# Patient Record
Sex: Male | Born: 1960 | Race: White | Hispanic: No | Marital: Married | Smoking: Former smoker
Health system: Southern US, Community
[De-identification: ages and names within clinical notes are randomized; demographics above are authoritative.]

## PROBLEM LIST (undated history)

## (undated) DIAGNOSIS — F32A Depression, unspecified: Secondary | ICD-10-CM

## (undated) DIAGNOSIS — F419 Anxiety disorder, unspecified: Secondary | ICD-10-CM

## (undated) DIAGNOSIS — E119 Type 2 diabetes mellitus without complications: Secondary | ICD-10-CM

## (undated) DIAGNOSIS — Z9889 Other specified postprocedural states: Secondary | ICD-10-CM

## (undated) DIAGNOSIS — J449 Chronic obstructive pulmonary disease, unspecified: Secondary | ICD-10-CM

## (undated) DIAGNOSIS — M1990 Unspecified osteoarthritis, unspecified site: Secondary | ICD-10-CM

## (undated) DIAGNOSIS — I10 Essential (primary) hypertension: Secondary | ICD-10-CM

## (undated) DIAGNOSIS — F329 Major depressive disorder, single episode, unspecified: Secondary | ICD-10-CM

## (undated) DIAGNOSIS — K219 Gastro-esophageal reflux disease without esophagitis: Secondary | ICD-10-CM

## (undated) DIAGNOSIS — E039 Hypothyroidism, unspecified: Secondary | ICD-10-CM

## (undated) DIAGNOSIS — Z8719 Personal history of other diseases of the digestive system: Secondary | ICD-10-CM

## (undated) DIAGNOSIS — Z862 Personal history of diseases of the blood and blood-forming organs and certain disorders involving the immune mechanism: Secondary | ICD-10-CM

## (undated) HISTORY — PX: HERNIA REPAIR: SHX51

## (undated) HISTORY — DX: Personal history of diseases of the blood and blood-forming organs and certain disorders involving the immune mechanism: Z86.2

## (undated) HISTORY — DX: Other specified postprocedural states: Z98.890

## (undated) HISTORY — PX: CARDIAC CATHETERIZATION: SHX172

## (undated) HISTORY — DX: Essential (primary) hypertension: I10

## (undated) HISTORY — PX: CARPAL TUNNEL RELEASE: SHX101

## (undated) HISTORY — PX: OTHER SURGICAL HISTORY: SHX169

## (undated) HISTORY — DX: Unspecified osteoarthritis, unspecified site: M19.90

## (undated) HISTORY — DX: Type 2 diabetes mellitus without complications: E11.9

---
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
## 1998-02-17 ENCOUNTER — Encounter: Admission: RE | Admit: 1998-02-17 | Discharge: 1998-02-17 | Payer: Self-pay | Admitting: *Deleted

## 1999-10-21 ENCOUNTER — Encounter: Payer: Self-pay | Admitting: Emergency Medicine

## 1999-10-21 ENCOUNTER — Emergency Department (HOSPITAL_COMMUNITY): Admission: EM | Admit: 1999-10-21 | Discharge: 1999-10-21 | Payer: Self-pay | Admitting: Emergency Medicine

## 2002-09-16 HISTORY — PX: CERVICAL DISC SURGERY: SHX588

## 2003-02-02 ENCOUNTER — Encounter: Payer: Self-pay | Admitting: Neurosurgery

## 2003-02-03 ENCOUNTER — Ambulatory Visit (HOSPITAL_COMMUNITY): Admission: RE | Admit: 2003-02-03 | Discharge: 2003-02-04 | Payer: Self-pay | Admitting: Neurosurgery

## 2003-02-03 ENCOUNTER — Encounter: Payer: Self-pay | Admitting: Neurosurgery

## 2003-07-11 ENCOUNTER — Encounter: Admission: RE | Admit: 2003-07-11 | Discharge: 2003-08-22 | Payer: Self-pay | Admitting: Orthopaedic Surgery

## 2003-11-29 ENCOUNTER — Encounter: Admission: RE | Admit: 2003-11-29 | Discharge: 2003-11-29 | Payer: Self-pay | Admitting: Rheumatology

## 2004-04-05 ENCOUNTER — Ambulatory Visit (HOSPITAL_COMMUNITY): Admission: RE | Admit: 2004-04-05 | Discharge: 2004-04-05 | Payer: Self-pay | Admitting: Family Medicine

## 2004-04-20 ENCOUNTER — Inpatient Hospital Stay (HOSPITAL_BASED_OUTPATIENT_CLINIC_OR_DEPARTMENT_OTHER): Admission: RE | Admit: 2004-04-20 | Discharge: 2004-04-20 | Payer: Self-pay | Admitting: Cardiology

## 2004-08-03 ENCOUNTER — Ambulatory Visit: Payer: Self-pay | Admitting: Family Medicine

## 2005-06-24 ENCOUNTER — Ambulatory Visit: Payer: Self-pay | Admitting: Family Medicine

## 2006-01-13 ENCOUNTER — Ambulatory Visit: Payer: Self-pay | Admitting: Family Medicine

## 2006-03-24 ENCOUNTER — Ambulatory Visit: Payer: Self-pay | Admitting: Family Medicine

## 2006-07-14 ENCOUNTER — Ambulatory Visit: Payer: Self-pay | Admitting: Family Medicine

## 2006-09-10 ENCOUNTER — Encounter: Admission: RE | Admit: 2006-09-10 | Discharge: 2006-09-10 | Payer: Self-pay | Admitting: Rheumatology

## 2007-01-19 ENCOUNTER — Ambulatory Visit: Payer: Self-pay | Admitting: Family Medicine

## 2007-01-20 ENCOUNTER — Ambulatory Visit: Payer: Self-pay | Admitting: Cardiovascular Disease

## 2007-01-20 ENCOUNTER — Ambulatory Visit (HOSPITAL_COMMUNITY): Admission: RE | Admit: 2007-01-20 | Discharge: 2007-01-20 | Payer: Self-pay | Admitting: Family Medicine

## 2008-07-22 ENCOUNTER — Ambulatory Visit (HOSPITAL_COMMUNITY): Admission: RE | Admit: 2008-07-22 | Discharge: 2008-07-22 | Payer: Self-pay | Admitting: Family Medicine

## 2008-10-31 ENCOUNTER — Emergency Department (HOSPITAL_COMMUNITY): Admission: EM | Admit: 2008-10-31 | Discharge: 2008-10-31 | Payer: Self-pay | Admitting: Emergency Medicine

## 2008-11-11 ENCOUNTER — Ambulatory Visit: Payer: Self-pay | Admitting: Cardiology

## 2009-01-20 ENCOUNTER — Ambulatory Visit: Payer: Self-pay | Admitting: Cardiology

## 2009-01-20 ENCOUNTER — Encounter (HOSPITAL_COMMUNITY): Admission: RE | Admit: 2009-01-20 | Discharge: 2009-02-19 | Payer: Self-pay | Admitting: Cardiology

## 2009-08-21 ENCOUNTER — Ambulatory Visit (HOSPITAL_COMMUNITY): Admission: RE | Admit: 2009-08-21 | Discharge: 2009-08-21 | Payer: Self-pay | Admitting: Family Medicine

## 2009-08-27 ENCOUNTER — Ambulatory Visit: Payer: Self-pay | Admitting: Cardiology

## 2010-09-28 ENCOUNTER — Ambulatory Visit (HOSPITAL_COMMUNITY)
Admission: RE | Admit: 2010-09-28 | Discharge: 2010-09-28 | Payer: Self-pay | Source: Home / Self Care | Attending: Family Medicine | Admitting: Family Medicine

## 2010-10-02 ENCOUNTER — Ambulatory Visit (HOSPITAL_COMMUNITY)
Admission: RE | Admit: 2010-10-02 | Discharge: 2010-10-02 | Payer: Self-pay | Source: Home / Self Care | Attending: Urology | Admitting: Urology

## 2010-10-07 ENCOUNTER — Encounter: Payer: Self-pay | Admitting: Cardiology

## 2011-01-01 LAB — POCT CARDIAC MARKERS: Myoglobin, poc: 82.7 ng/mL (ref 12–200)

## 2011-01-01 LAB — HEPATIC FUNCTION PANEL
Albumin: 4.1 g/dL (ref 3.5–5.2)
Alkaline Phosphatase: 46 U/L (ref 39–117)
Indirect Bilirubin: 1 mg/dL — ABNORMAL HIGH (ref 0.3–0.9)
Total Bilirubin: 1.1 mg/dL (ref 0.3–1.2)
Total Protein: 7.3 g/dL (ref 6.0–8.3)

## 2011-01-01 LAB — CBC
Hemoglobin: 14 g/dL (ref 13.0–17.0)
MCHC: 34.5 g/dL (ref 30.0–36.0)
MCV: 97.2 fL (ref 78.0–100.0)
RBC: 4.16 MIL/uL — ABNORMAL LOW (ref 4.22–5.81)
RDW: 13.5 % (ref 11.5–15.5)
WBC: 6.4 10*3/uL (ref 4.0–10.5)

## 2011-01-01 LAB — DIFFERENTIAL
Basophils Relative: 1 % (ref 0–1)
Lymphocytes Relative: 21 % (ref 12–46)
Lymphs Abs: 1.3 10*3/uL (ref 0.7–4.0)
Neutrophils Relative %: 70 % (ref 43–77)

## 2011-01-01 LAB — BASIC METABOLIC PANEL
BUN: 16 mg/dL (ref 6–23)
Calcium: 9.1 mg/dL (ref 8.4–10.5)
Chloride: 102 mEq/L (ref 96–112)
Creatinine, Ser: 0.93 mg/dL (ref 0.4–1.5)
Glucose, Bld: 98 mg/dL (ref 70–99)
Potassium: 4.2 mEq/L (ref 3.5–5.1)

## 2011-01-01 LAB — LIPASE, BLOOD: Lipase: 25 U/L (ref 11–59)

## 2011-01-08 ENCOUNTER — Observation Stay (HOSPITAL_COMMUNITY)
Admission: RE | Admit: 2011-01-08 | Discharge: 2011-01-09 | DRG: 125 | Disposition: A | Discharge status: Home / Self Care | Payer: BC Managed Care – PPO | Source: Ambulatory Visit | Attending: Cardiovascular Disease | Admitting: Cardiovascular Disease

## 2011-01-08 DIAGNOSIS — Z79899 Other long term (current) drug therapy: Secondary | ICD-10-CM | POA: Insufficient documentation

## 2011-01-08 DIAGNOSIS — M069 Rheumatoid arthritis, unspecified: Secondary | ICD-10-CM | POA: Insufficient documentation

## 2011-01-08 DIAGNOSIS — R079 Chest pain, unspecified: Principal | ICD-10-CM | POA: Insufficient documentation

## 2011-01-08 DIAGNOSIS — Z8249 Family history of ischemic heart disease and other diseases of the circulatory system: Secondary | ICD-10-CM | POA: Insufficient documentation

## 2011-01-22 NOTE — Discharge Summary (Signed)
  [name], BOCCIO NO.:  [id number]  MEDICAL RECORD NO.:  [id number]           PATIENT TYPE:  I  LOCATION:  6522                         FACILITY:  [facility]  PHYSICIAN:  Nanetta Batty, M.D.   DATE OF BIRTH:  December 20, 1960  DATE OF ADMISSION:  01/08/2011 DATE OF DISCHARGE:  01/09/2011                              DISCHARGE SUMMARY   DISCHARGE DIAGNOSES: 1. Chest pain, status post left heart catheterization on January 09, 2011, showed normal coronary arteries, normal left ventricular     function. 2. History of gastroesophageal reflux disease. 3. History of rheumatoid arthritis. 4. History of esophageal ulcer recently. 5. Strong family history of coronary artery disease.  HOSPITAL COURSE:  [name] is a 50 year old obese Caucasian male with a history of gastroesophageal reflux disease, rheumatoid arthritis, esophageal ulcer, tobacco abuse .  He presented with chest pain in the office which is different from previous chest pains and given his strong family history, he is admitted and setup for left heart catheterization. The left heart catheterization showed normal coronary arteries and LV function.  He has been seen by Dr. Allyson Sabal, feels he is stable for discharge with return to office visit in 1-2 weeks.  The patient has no further complaints of chest pain at this time.  Discharge vital signs; temperature 97.7, pulse 69, BP 115/76, respirations 20, and O2 sat 95% on room air.  STUDIES/PROCEDURES:  Left heart catheterization on January 09, 2011, revealed normal coronary arteries, normal left ventricular function.  DISCHARGE MEDICATIONS: 1. Aspirin 81 mg enteric-coated 1 tablet by mouth daily. 2. Ambien 10 mg 1 tab by mouth daily at bedtime. 3. Folic acid 1 mg 1 tablet by mouth twice daily. 4. Methotrexate 2.5 mg 10 tablets by mouth on Saturday. 5. Multivitamin 1 tablet by mouth daily. 6. Pantoprazole 40 mg 1 tablet by mouth daily. 7. Wellbutrin SR 150 mg 1  tablet by mouth daily.  DISPOSITION:  Mr. Wah discharged home in stable condition.  He was recommended to increase his activity slowly.  No lifting or driving for 2 days and he shower and bathe.  He was recommended to eat heart-healthy diet.  If catheter site becomes red, painful, swollen or discharges fluid or pus to call our office.  He will follow up with Dr. Allyson Sabal in approximately 1-2 and our office will call him with the appointment time.    ______________________________ Wilburt Finlay, PA   ______________________________ Nanetta Batty, M.D.    BH/MEDQ  D:  01/09/2011  T:  01/10/2011  Job:  [id number]  cc:   Madelin Rear. Sherwood Gambler, MD  Electronically Signed by Wilburt Finlay PA on 01/11/2011 11:23:59 AM Electronically Signed by Nanetta Batty M.D. on 01/22/2011 07:38:23 AM

## 2011-01-22 NOTE — Cardiovascular Report (Signed)
  NAMEDEAUNTE, DENTE NO.:  [id number]  MEDICAL RECORD NO.:  [id number]           PATIENT TYPE:  I  LOCATION:  6522                         FACILITY:  [facility]  PHYSICIAN:  Nanetta Batty, M.D.   DATE OF BIRTH:  11-19-1960  DATE OF PROCEDURE: DATE OF DISCHARGE:  01/09/2011                           CARDIAC CATHETERIZATION   Mr. Strader is a 50 year old mildly overweight married Caucasian male, father of one, grandfather of one grandchild.  His risk factors include 50-pack-year history of tobacco abuse, smoking from the age of [age] when he stopped.  He has an extremely strong positive history of heart disease.  His cath in the 1999 again in August 2005 with normal coronary arteries.  He does have GERD and was recently found to have "esophageal ulcer."  He had accelerated chest pain and palpitations.  Different than his "GERD pain" and presents now for diagnostic coronary arteriography to define his anatomy, rule out ischemic etiology.  DESCRIPTION OF PROCEDURE:  The patient was brought to the second floor [place] cardiac cath lab in the postabsorptive state.  He was premedicated with p.o. Valium, IV fentanyl.  His right wrist was prepped and shaved in usual sterile fashion.  Xylocaine 1% was used for local anesthesia.  A 5-French sheath was inserted into the right radial artery using standard Seldinger technique and a back wall pullback technique. Radial cocktail 10 mL was administered via the sidearm sheath.  The patient received 5000 units of heparin intravenously.  Visipaque dye was used for the entirety of the case.  A 5-French TIG catheter and pigtail catheter were used for selective coronary angiography, left ventriculography.  Retrograde aorta, left ventricular, and pullback pressures were recorded.  HEMODYNAMICS: 1. Aortic systolic pressure 127, diastolic pressure 85. 2. Let ventricular systolic pressure 121, end-diastolic pressure 17.  SELECTIVE  CHOLANGIOGRAPHY: 1. Left main normal. 2. LAD normal. 3. Left circumflex normal. 4. Right coronary was dominant, normal. 5. Left ventriculography; RAO left ventriculogram was performed.  A 25     mL of Visipaque dye at 12 mL per second.  The overall LVEF     estimated 60% without focal wall motion abnormalities.  IMPRESSION:  Mr. Whitby has normal coronary arteries, normal LV function. I believe his chest pain is noncardiac.  The guidewire and catheter removed.  The sheath was removed and TR band was placed in right wrist to achieve patent hemostasis.  The patient left the lab in stable condition.  The TR band will be removed progressively over the next 2 hours.  The patient will be discharged home after that on a proton pump inhibitor. He will see me back in the office in 2-3 weeks for followup.  Dr. Lyman Bishop Fusco's office was notified of these results.     Nanetta Batty, M.D.     JB/MEDQ  D:  01/09/2011  T:  01/09/2011  Job:  [id number]  cc:   [facility] Madelin Rear. Sherwood Gambler, MD  Electronically Signed by Nanetta Batty M.D. on 01/22/2011 07:38:25 AM

## 2011-01-29 NOTE — Assessment & Plan Note (Signed)
[facility]                       [place] CARDIOLOGY OFFICE NOTE   NAME:Mahnke, ZAFIR SCHAUER                       MRN:          [id number]  DATE:11/11/2008                            DOB:          [birth year]    CHIEF COMPLAINT:  Chest tightness, aching into my shoulder and arm!   HISTORY OF PRESENT ILLNESS:  Mr. Reginald Weida is a 50 year old married  white male who had unprovoked discomfort as described above and came to  the emergency room on October 31, 2008.  At that time, his EKG was  normal.  His cardiac markers were negative.  Laboratory data was also  unremarkable including a CBC, BMET, and his chest x-ray was negative.  He also had a lipase as well as LFTs, which were also normal.   He was set up for a visit with [place] because of a positive family history  of coronary artery disease.   He denies any exertional chest tightness and pressure, but does have  some mild dyspnea on exertion.  He has been a heavy smoker in the past  and is overweight having gained 60 pounds since he quit smoking 6 months  ago.  He weighs close to 300 pounds.  He admits this is responsible for  shortness of breath which is not changed traumatically recently.   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  He says his cholesterol  levels are good, though I have no report of these.  He is not diabetic.  He does not have hypertension.   He is very worried about his family history.  However, when you talk to  him about all his brothers and sisters, not to mention his father, who  died of a massive MI at age 62.  They all smoked.  Some of them had type  2 diabetes and were overweight.   PAST MEDICAL HISTORY:  He has no known drug allergies.  He is currently  on folic acid 1 mg p.o. b.i.d., methotrexate 10 tablets q. week,  Prilosec over-the-counter daily, aspirin 325 mg a day.   He does not smoke, does not use any drugs.  He does not comment on  alcohol use.   His  hospitalizations include a cardiac catheterization no 2005 as well  as in 1999.  His catheterization in 2005 did not show any  angiographically significant coronary disease and normal left  ventricular function.   He has had double disk repair in 2004.  He has also had a carpal tunnel  surgery.   SOCIAL HISTORY:  He is married.  He has a 51-year-old granddaughter who  he is just taking custody of, whose father is in prison.  There is lot  of stress obviously.  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   FAMILY HISTORY:  As outlined above.  Please see the diagnostic  evaluation form for details.   REVIEW OF SYSTEMS:  He wears reading glasses.  He has some hearing loss.  The rest of review of systems are negative.   PHYSICAL EXAMINATION:  VITAL SIGNS:  His blood  pressure is 100/80 in the  left arm.  His pulse is 70 and regular.  He is 6 feet 2, weighs 294.  GENERAL:  He is very gregarious, alert and oriented x3.  He is  significantly overweight.  HEENT:  Other than dentition being a poor shape is unremarkable.  NECK:  Carotid upstrokes were equal bilaterally without bruits.  Thyroid  is not enlarged.  Trachea is midline.  LUNGS:  Clear to auscultation and percussion.  HEART:  Nondisplaced PMI, normal S1 and S2.  No gallop.  ABDOMEN:  Protuberant, good bowel sounds.  No midline bruit.  EXTREMITIES:  No cyanosis or clubbing.  1+ edema.  Pulses are intact and  brisk.  NEURO:  Intact.  SKIN:  Unremarkable.   His EKG is completely normal.   I have retrieved carotid Dopplers from Jan 20, 2007, which basically  showed small amount of noncalcified plaque in the left carotid  bifurcation, antegrade flow in both vertebrals, and some minimal intimal  thickening in the right carotid system.   ASSESSMENT:  1. Chest discomfort, nonexertional, doubt significant obstructive      coronary artery disease.  2. Dyspnea on exertion, which is not dramatically changed.  3. Obesity.  4.  Heavy tobacco use in the past which he quit 6 months ago, had      chronic obstructive pulmonary disease.  5. Markedly positive family history of coronary disease, though they      all smoke and many of them are overweight and had some diabetes.   PLAN:  I had a long talk with Mr. Makela today.  We have gone over the  importance of good healthy lifestyle at length.  We have also discussed  the importance of never smoking again.  I have tried to encourage him to  try to walk 3 hours per week particularly with the sedentary job as a  Naval architect.   I have told him to remain on aspirin.  Full range for an exercise rest  stress Myoview to rule out any obstructive coronary disease.  If this is  negative, we will see him back again in 3 years.     Thomas C. Daleen Squibb, MD, [facility]  Electronically Signed    TCW/MedQ  DD: 11/11/2008  DT: 11/12/2008  Job #: [id number]   cc:   Nicoletta Dress. Colon Branch, M.D.

## 2011-02-01 NOTE — Op Note (Signed)
NAME:  Dustin [name], Dustin [name]                          ACCOUNT NO.:  [id number]   MEDICAL RECORD NO.:  [id number]                   PATIENT TYPE:  OIB   LOCATION:  3003                                 FACILITY:  [facility]   PHYSICIAN:  Danae Orleans. Venetia Maxon, M.D.               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   OPERATION PERFORMED:  Anterior cervical decompression and fusion C3-4 and C4-  5 levels with allograft bone graft and anterior cervical plate.   SURGEON:  Danae Orleans. Venetia Maxon, M.D.   ASSISTANT:  Hewitt Shorts, M.D.   ANESTHESIA:  General endotracheal.   ESTIMATED BLOOD LOSS:  75mL.   COMPLICATIONS:  None.   DISPOSITION:  Recovery.   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  It was elected to take him to surgery for anterior cervical  decompression and fusion at these two affected levels.   DESCRIPTION OF PROCEDURE:  Dustin [name] was brought to the operating room.  Following satisfactory and uncomplicated induction of general endotracheal  anesthesia and placement of intravenous lines, the patient was placed in  supine position on the operating table.  His neck was placed in slight  extension.  He was placed in 10 pounds of halter traction.  His anterior  neck was then prepped and draped in the usual sterile fashion.  The area of  planned incision was infiltrated with 0.25% Marcaine, 0.5% lidocaine,  1:200,000 epinephrine.   Incision was made in the midline and carried through  the anterior border of the sternocleidomastoid muscle, sharply to the  platysma layer.  Subplatysmal dissection was performed exposing the anterior  cervical spine keeping the carotid sheath lateral and trachea and esophagus  medial.  The bent spinal needle was placed at what was felt to be C4-5 level  and this was confirmed on intraoperative x-ray.  Subsequently the longus  colli muscle was taken down from the anterior cervical spine from C3 through  C5 levels bilaterally using electrocautery and Key elevator.  Shadowline  self-retaining retractor was placed along with the up down retractor.  The  disk spaces at  C3-4 and C4-5 were then incised with a 15 blade and disk  material was removed in a piecemeal fashion.  The disk space spreader was  placed initially at the C4-5 level and using Carlens microcurets, the end  plates were stripped of residual  disk material down to the posterior  longitudinal ligament.  Similar decompression was performed at the C3-4  level.  Initially at the C3-4 level, the microscope was brought into the  field using high powered microdissection technique using the A2 equivalent  bur, the end plates at C3 and C4 were decorticated and the large uncinate  spur on the left at C3-4 was drilled down.  The posterior longitudinal  ligament was then incised with an arachnoid knife and removed in piecemeal  fashion resulting in significant decompression of the central spinal cord  dura.  The right C4 nerve root was decompressed and the left C4 nerve root  was decompressed.  Some fragments of disk material were found directly  overlying the C4 nerve root and this was extensively decompressed as it  extended out the neural foramen.  Hemostasis was assured with Gelfoam soaked  in Thrombin and using the trial sizers, an 8 mm bone graft was selected.  This was rehydrated and then countersunk into the interspace  appropriately.  Attention was then turned to the C4-5 level where a similar decompression  was performed.  At this level the uncinate spurs were drilled down.  The  posterior longitudinal ligament was again incised with an arachnoid knife  and removed in piecemeal fashion. The central spinal cord dura and bilateral  C5 nerve roots were decompressed to the extent of the neural foramina.  Hemostasis was again ensured.  A similarly sized bone graft was placed.  A  44 mm Trinica anterior cervical plate was then affixed to the anterior  cervical spine with 14 mm variable angle screws, two at C3, two at C4 and  two at C5 levels.  All screws had excellent purchase, locking mechanism  __________.  Final x-ray confirmed positioning of bone grafts and anterior  cervical plate although it was not possible to visualize down to the C4-5  level because of the patient's extremely large body habitus.  The wound was  copiously irrigated with bacitracin saline.  Soft tissues were inspected and  found to be in good repair.  The platysma layer was then closed with 3-0  Vicryl sutures and the skin edges were reapproximated with running 4-0  Vicryl subcuticular stitch.  The wound was dressed with Dermabond.  The  patient was extubated in the operating room and taken to recovery room in  stable and satisfactory condition having tolerated the procedure well.  Counts were correct at the end of the case.                                                Danae Orleans. Venetia Maxon, M.D.    JDS/MEDQ  D:  02/03/2003  T:  02/03/2003  Job:  [id number]

## 2011-02-01 NOTE — Procedures (Signed)
Dustin [name], Dustin [name] NO.:  [id number]   MEDICAL RECORD NO.:  [id number]          PATIENT TYPE:  OUT   LOCATION:  RAD                           FACILITY:  [facility]   PHYSICIAN:  Noralyn Pick. Eden Emms, MD, FACCDATE OF BIRTH:  08-10-1961   DATE OF PROCEDURE:  01/20/2007  DATE OF DISCHARGE:                                ECHOCARDIOGRAM   INDICATIONS:  TIA, strong family history.   FAMILY HISTORY:  The ventricle cavity size was normal.  There were no  regional wall motion abnormalities and no mural thrombus.  Ejection  fraction was 60%.  Mitral valve had some nodular thickening of the  leaflet tips and some mitral annular calcification.  There was mild MR.  Left atrium was borderline in size.  Right-sided cardiac chambers were  normal.  There was no evidence of pulmonary retention. Aortic valve was  trileaflet and minimally sclerotic. Aortic root was normal.  Subcostal  imaging revealed no atrial septal defect, no source of embolus.  There  was a very trivial posterior pericardial effusion.   M-mode measurements included an aortic dimension of 31 mm, left atrial  dimension of 45 mm, septal thickness of 12 mm, LV diastolic dimension of  49 mm, LV systolic dimension of 41 mm.   FINAL IMPRESSION:  1. Mild left ventricular hypertrophy,  ejection fraction 6%.  No wall      motion abnormalities.  2. No source of embolus.  3. Nodular thickening of mitral valve with mild mitral regurgitation.  4. Normal aortic valve.  5. Normal right-sided cardiac chambers without pulmonary hypertension.  6. Trivial posterior pericardial effusion.      Noralyn Pick. Eden Emms, MD, [facility]  Electronically Signed     PCN/MEDQ  D:  01/20/2007  T:  01/20/2007  Job:  [id number]

## 2011-02-01 NOTE — Cardiovascular Report (Signed)
NAME:  Dustin [name], Dustin [name]                          ACCOUNT NO.:  [id number]   MEDICAL RECORD NO.:  [id number]                   PATIENT TYPE:  OIB   LOCATION:  6501                                 FACILITY:  [facility]   PHYSICIAN:  Arturo Morton. Riley Kill, M.D. [facility]         DATE OF BIRTH:  [birth year]   DATE OF PROCEDURE:  DATE OF DISCHARGE:  04/20/2004                              CARDIAC CATHETERIZATION   INDICATIONS:  Mr. Liotta is a 50 year old who has multiple cardiac risk  factors, who has had an abnormal EKG, he has a strong family history of  coronary artery disease.  He saw Dr. Gerri Spore and subsequently was  recommended for cardiac catheterization.  He was sent to the [facility]  [facility] by Dr. Gerri Spore for further evaluation.   PROCEDURE:  1. Left heart catheterization.  2. Selective coronary arteriography.  3. Selective left ventriculography.   DESCRIPTION OF PROCEDURE:  The patient was brought to the catheterization  laboratory and prepped and draped in the usual fashion.  Through an anterior  puncture, the right femoral artery was easily entered.  A 4 French sheath  was placed.  Central aortic and left ventricular pressures were measured  with a pigtail.  Ventriculography was performed in the RAO projection.  Coronary arteriography was performed with standard Judkins catheters without  complication.  All catheters were subsequently removed and he was taken to  the holding area in satisfactory clinical condition.   HEMODYNAMIC DATA:  1. Left ventricle 122/19.  2. Aorta 131/89.   ANGIOGRAPHIC DATA:  1. Ventriculography was performed in the RAO projection.  This was a fairly     low volume injection, however, overall systolic function appeared to be     well-preserved.  No definite wall motion abnormalities were seen.     Ejection fraction was felt to be in excess of 55%.  2. The left main coronary artery was free of critical disease.  3. The left anterior descending artery courses  to the apex.  There is one     major diagonal branch, the LAD, and its branches are free of significant     disease.  There may be some mild tapering irregularity of the distal LAD,     but there does not appear to be high-grade focal obstruction.  4. The circumflex provides a large marginal branch and then terminates as a     large posterolateral branch.  The circumflex and its branches are free of     significant disease.  5. The right coronary artery is a dominant vessel with a single PDA, the     right coronary is also smooth and without significant focal narrowing.   CONCLUSIONS:  1. Well-preserved left ventricular function.  2. No high-grade areas of focal stenosis noted.   DISPOSITION:  The patient has multiple cardiac risk factors.  First, he has  a strong family history.  He is also obese, has recently  been a smoker of  long-term duration, and has an inflammatory disease with rheumatoid  arthritis.  He also has poor dentition.  I have explained this to the family.  He needs  to try to reduce his weight, discontinue smoking, and improve his dentition.  Long-term continuing care for his rheumatoid arthritis will be important.  Follow up with Dr. Gerri Spore in the office will be recommended as well as  followup with Dr. Lysbeth Galas.                                               Arturo Morton. Riley Kill, M.D. [facility]    TDS/MEDQ  D:  04/20/2004  T:  04/21/2004  Job:  [id number]   cc:   Delaney Meigs, M.D.  [address]  [place]  [place] [address]  Fax: [phone]   Carole Binning, M.D. [facility]

## 2011-10-29 ENCOUNTER — Other Ambulatory Visit (HOSPITAL_COMMUNITY): Payer: Self-pay | Admitting: Family Medicine

## 2011-10-30 ENCOUNTER — Other Ambulatory Visit (HOSPITAL_COMMUNITY): Payer: Self-pay | Admitting: Family Medicine

## 2011-10-30 DIAGNOSIS — K219 Gastro-esophageal reflux disease without esophagitis: Secondary | ICD-10-CM

## 2011-11-01 ENCOUNTER — Ambulatory Visit (HOSPITAL_COMMUNITY): Payer: BC Managed Care – PPO

## 2011-11-04 ENCOUNTER — Ambulatory Visit (HOSPITAL_COMMUNITY)
Admission: RE | Admit: 2011-11-04 | Discharge: 2011-11-04 | Disposition: A | Discharge status: Home / Self Care | Payer: BC Managed Care – PPO | Source: Ambulatory Visit | Attending: Family Medicine | Admitting: Family Medicine

## 2011-11-04 DIAGNOSIS — R918 Other nonspecific abnormal finding of lung field: Secondary | ICD-10-CM | POA: Insufficient documentation

## 2011-11-04 DIAGNOSIS — K219 Gastro-esophageal reflux disease without esophagitis: Secondary | ICD-10-CM

## 2011-11-04 DIAGNOSIS — R599 Enlarged lymph nodes, unspecified: Secondary | ICD-10-CM | POA: Insufficient documentation

## 2011-11-07 NOTE — Op Note (Deleted)
NAMEAKIEL, FENNELL NO.:  [id number]  MEDICAL RECORD NO.:  [id number]  LOCATION:  RAD                           FACILITY:  [facility]  PHYSICIAN:  Jene Every, M.D.    DATE OF BIRTH:  January 05, 1961  DATE OF PROCEDURE:  11/06/2011 DATE OF DISCHARGE:  11/04/2011                              OPERATIVE REPORT   PREOPERATIVE DIAGNOSES:  Labral tear, rotator cuff tear, biceps tear, and impingement syndrome of left shoulder.  POSTOPERATIVE DIAGNOSES:  Labral tear, rotator cuff tear, biceps tear, and impingement syndrome of left shoulder.  PROCEDURE PERFORMED: 1. Exam under anesthesia. 2. Left shoulder arthroscopy with debridement of extensive tear of the     glenoid, right labrum. 3. Biceps tenotomy and debridement of biceps stump. 4. Subacromial decompression, acromioplasty, and bursectomy. 5. Mini-open rotator cuff repair of the supraspinatus with Mitek     suture anchors and PushLock anchors.  ANESTHESIA:  General.  ASSISTANT:  Roma Schanz, P.A., utilized for exposure, irrigation, and traction.  HISTORY:  This is a 51 year old with left shoulder pain, labral tear, biceps tear, rotator cuff tear, refractory conservative treatment, indicated for repair of tenotomy and debridement.  Risks and benefits discussed including bleeding, infection, no change in symptoms, worsening symptoms, need for repeat debridement, DVT, PE, anesthetic complications, cosmetic deformity from the biceps tenotomy.  The patient had anterior pain of the biceps, rotator cuff pain with impingement and it was indicated for the above-mentioned technique.  DESCRIPTION OF PROCEDURE:  The patient was placed supine in the beach- chair position after induction of adequate general anesthesia, 1 g Kefzol.  Exam under anesthesia revealed full range of left shoulder and upper extremity, was prepped and draped in usual sterile fashion.  A surgical marker utilized to delineate the acromion, AC  joint, and coracoid.  Standard posterolateral and anterolateral incisions were made through the skin only with an 11 blade for corresponding portals.  With the arm in the 70 and 30 position, we advanced the arthroscopic camera into the glenohumeral joint penetrating it atraumatically and under direct visualization fashioned the anterior portal with an 18-gauge needle localized in between the coracoid and the anterolateral aspect of the acromion with a #11 blade.  We advanced the cannula into the glenohumeral joint.  Noted was extensive tearing of the labrum.  Some minor degenerative changes to the glenohumeral joint.  Biceps tendon was subluxed out of its groove and severely attenuated, shredded, and near completely torn.  It was pulling against the labral tear as well.  I felt it was not salvageable, and a significant pain generator.  I introduced the cannula.  I introduced a straight basket and I completed the biceps tear with a tenotomy and then debrided the stump to a stable base to the superior aspect of the labrum I debrided the anterior, superior, and posterior labrum as well.  Examination of the inferior aspect of the rotator cuff revealed a significant tear through and through the rotator cuff.  After the tenotomy and the debridement of the biceps stump, we then redirected the camera in the subacromial space posteriorly and laterally.  Triangulating in the subacromial space, exuberant bursa was noted and this was  excised with a bursectomy, morselized, and released the CA ligament and performed acromioplasty in the anterolateral aspect of the acromion.  Full-thickness tear of the supraspinatus was noted and we therefore converted to a mini-open with a 2 cm incision in the raphe of the anterolateral aspect of the acromion and dissected down to the subacromial space using a self-retaining mini retractor.  Identified the full-thickness tear and retracted the root of the supraspinatus  about 1 cm of retraction and debrided the edges to good bleeding tissue and digitally lysed any adhesions in the subacromial joint, prepared a bed in the cancellous bone lateral to the articular surface with a Beyer rongeur and a straight curette.  Good bleeding tissue was noted.  We then placed 2 Mitek suture anchors, spaced approximately 1.5 cm apart from underneath the supraspinatus towards the subscapularis and advanced it with the arm abducted and tied a good surgical knot and crossed them and over the lateral aspect of the greater tuberosity performed a double row technique with PushLock and we tapped them into the lateral aspect of the greater tuberosity with an awl and inserted the cross leaflets from the Mitek suture ends with good 2nd row fixation.  Full coverage was noted and the remainder of the cuff was unremarkable.  No tension at the site.  After copious irrigation, I then removed the redundant suture and repaired the raphe with 1 Vicryl interrupted figure-of-8 sutures and subcutaneous with 2-0 Vicryl suture. Skin with a 4-0 subcuticular Prolene.  Wound reinforced with Steri- Strips.  Sterile dressing applied and irrigated throughout.  No active bleeding.  Sterile dressing applied.  Abduction pillow placed and extubated without difficulty and transported to the recovery room in satisfactory condition.  The patient tolerated the procedure well.  No complications.  ESTIMATED BLOOD LOSS:  Minimal blood loss.     Jene Every, M.D.     Cordelia Pen  D:  11/06/2011  T:  11/07/2011  Job:  [id number]

## 2011-12-17 ENCOUNTER — Other Ambulatory Visit (HOSPITAL_COMMUNITY): Payer: Self-pay | Admitting: Internal Medicine

## 2011-12-17 DIAGNOSIS — M25519 Pain in unspecified shoulder: Secondary | ICD-10-CM

## 2011-12-17 DIAGNOSIS — R209 Unspecified disturbances of skin sensation: Secondary | ICD-10-CM

## 2011-12-19 ENCOUNTER — Ambulatory Visit (HOSPITAL_COMMUNITY)
Admission: RE | Admit: 2011-12-19 | Discharge: 2011-12-19 | Disposition: A | Discharge status: Home / Self Care | Payer: BC Managed Care – PPO | Source: Ambulatory Visit | Attending: Internal Medicine | Admitting: Internal Medicine

## 2011-12-19 DIAGNOSIS — M25519 Pain in unspecified shoulder: Secondary | ICD-10-CM

## 2011-12-19 DIAGNOSIS — R209 Unspecified disturbances of skin sensation: Secondary | ICD-10-CM

## 2011-12-20 ENCOUNTER — Other Ambulatory Visit (HOSPITAL_COMMUNITY): Payer: Self-pay | Admitting: Internal Medicine

## 2011-12-20 DIAGNOSIS — Z139 Encounter for screening, unspecified: Secondary | ICD-10-CM

## 2011-12-21 ENCOUNTER — Ambulatory Visit
Admission: RE | Admit: 2011-12-21 | Discharge: 2011-12-21 | Disposition: A | Discharge status: Home / Self Care | Payer: BC Managed Care – PPO | Source: Ambulatory Visit | Attending: Internal Medicine | Admitting: Internal Medicine

## 2011-12-21 ENCOUNTER — Other Ambulatory Visit: Payer: Self-pay | Admitting: Internal Medicine

## 2011-12-21 ENCOUNTER — Other Ambulatory Visit: Payer: BC Managed Care – PPO

## 2011-12-21 DIAGNOSIS — M25511 Pain in right shoulder: Secondary | ICD-10-CM

## 2012-01-07 ENCOUNTER — Encounter (HOSPITAL_COMMUNITY): Payer: Self-pay | Admitting: Respiratory Therapy

## 2012-01-07 ENCOUNTER — Other Ambulatory Visit: Payer: Self-pay | Admitting: Neurosurgery

## 2012-01-13 NOTE — Pre-Procedure Instructions (Addendum)
20 ARSEN MANGIONE  01/13/2012   Your procedure is scheduled on:  Wednesday, May 7th.  Report to [facility] at 1030 AM.  Call this number if you have problems the morning of surgery: [phone]   Remember:   Do not eat food:After Midnight.  May have clear liquids: up to 4 Hours before arrival.630am  Clear liquids include soda, tea, black coffee, apple or grape juice, broth.   Take these medicines the morning of surgery with A SIP OF WATER: .  May take Oxyodone- Acetaminophen  (Perocet )if needed.   Discontinue Aspirin. NSAIDS, Coumadin, Plavix, Effient and Herbal medications.   Do not wear jewelry, make-up or nail polish.  Do not wear lotions, powders, or perfumes. You may wear deodorant.  Do not shave 48 hours prior to surgery.  Do not bring valuables to the hospital.  Contacts, dentures or bridgework may not be worn into surgery.  Leave suitcase in the car. After surgery it may be brought to your room.  For patients admitted to the hospital, checkout time is 11:00 AM the day of discharge.   Patients discharged the day of surgery will not be allowed to drive home.  Name and phone number of your driver: --deborah wife [phone]  Special Instructions: CHG Shower Use Special Wash: 1/2 bottle night before surgery and 1/2 bottle morning of surgery.   Please read over the following fact sheets that you were given: Pain Booklet, Coughing and Deep Breathing, MRSA Information and Surgical Site Infection Prevention

## 2012-01-14 ENCOUNTER — Encounter (HOSPITAL_COMMUNITY)
Admission: RE | Admit: 2012-01-14 | Discharge: 2012-01-14 | Disposition: A | Discharge status: Home / Self Care | Payer: BC Managed Care – PPO | Source: Ambulatory Visit | Attending: Neurosurgery | Admitting: Neurosurgery

## 2012-01-14 ENCOUNTER — Encounter (HOSPITAL_COMMUNITY)
Admission: RE | Admit: 2012-01-14 | Discharge: 2012-01-14 | Disposition: A | Discharge status: Home / Self Care | Payer: BC Managed Care – PPO | Source: Ambulatory Visit | Attending: Anesthesiology | Admitting: Anesthesiology

## 2012-01-14 ENCOUNTER — Encounter (HOSPITAL_COMMUNITY): Payer: Self-pay

## 2012-01-14 HISTORY — DX: Anxiety disorder, unspecified: F41.9

## 2012-01-14 HISTORY — DX: Major depressive disorder, single episode, unspecified: F32.9

## 2012-01-14 HISTORY — DX: Personal history of other diseases of the digestive system: Z87.19

## 2012-01-14 HISTORY — DX: Gastro-esophageal reflux disease without esophagitis: K21.9

## 2012-01-14 HISTORY — DX: Depression, unspecified: F32.A

## 2012-01-14 LAB — COMPREHENSIVE METABOLIC PANEL
Albumin: 3.9 g/dL (ref 3.5–5.2)
Alkaline Phosphatase: 50 U/L (ref 39–117)
BUN: 17 mg/dL (ref 6–23)
Chloride: 101 mEq/L (ref 96–112)
Creatinine, Ser: 0.89 mg/dL (ref 0.50–1.35)
GFR calc Af Amer: >90 mL/min (ref >90)
GFR calc non Af Amer: >90 mL/min (ref >90)
Glucose, Bld: 243 mg/dL — ABNORMAL HIGH (ref 70–99)
Potassium: 4.3 mEq/L (ref 3.5–5.1)
Total Bilirubin: 0.7 mg/dL (ref 0.3–1.2)

## 2012-01-14 LAB — SURGICAL PCR SCREEN: MRSA, PCR: NEGATIVE

## 2012-01-14 LAB — CBC
MCV: 98.3 fL (ref 78.0–100.0)
Platelets: 219 10*3/uL (ref 150–400)
RBC: 4.08 MIL/uL — ABNORMAL LOW (ref 4.22–5.81)
RDW: 13.1 % (ref 11.5–15.5)
WBC: 9.1 10*3/uL (ref 4.0–10.5)

## 2012-01-14 NOTE — Progress Notes (Signed)
req'd notes from dr berry from last year. Stress , echo in epic

## 2012-01-20 MED ORDER — CEFAZOLIN SODIUM-DEXTROSE 2-3 GM-% IV SOLR
2.0000 g | INTRAVENOUS | Status: AC
  Administered 2012-01-21: 2 g via INTRAVENOUS
  Filled 2012-01-20: qty 50

## 2012-01-20 NOTE — Interval H&P Note (Signed)
History and Physical Interval Note:  01/20/2012 6:14 PM  Dustin [name]  has presented today for surgery, with the diagnosis of cervical herniated disc cervical spondylosis cervical stenosis cervical radiculopathy  The various methods of treatment have been discussed with the patient and family. After consideration of risks, benefits and other options for treatment, the patient has consented to  Procedure(s) (LRB): ANTERIOR CERVICAL DECOMPRESSION/DISCECTOMY FUSION 2 LEVELS (N/A) as a surgical intervention .  The patients' history has been reviewed, patient examined, no change in status, stable for surgery.  I have reviewed the patients' chart and labs.  Questions were answered to the patient's satisfaction.     Houda Brau D  Date of Initial H&P: 01/03/2012  History reviewed, patient examined, no change in status, stable for surgery.

## 2012-01-20 NOTE — H&P (Signed)
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 His postoperative course from which he ultimately did well but he was diagnosed with rheumatoid arthritis after that medical treatment and he has been on treatment for rheumatoid arthritis including Methotrexate since then and has done well with that.  Hew as recently injured while winding landing gear on his truck trailer and felt a "catch" in his right shoulder followed by tingling in his right hand when driving and excruciating pain the next morning.    He has been in agonizing pain ever since then and was initially seen by a physician at [facility] for [facility] but apparently this was denied and he was told he had a pre-existing condition.  He was then referred to Dr. Channing Mutters on 12/21/2011 but had no response from his office and elected to come see since I had previously taken care of him.  He did this under his own insurance as he has basically not been able to progress with care because he was denied [employer] and he is in too much pain not to do something about it.  He says this began on 12/07/2011.    REVIEW OF SYSTEMS:   A detailed Review of Systems sheet was reviewed with the patient.  Pertinent positives include rheumatoid arthritis, diabetes, and depression.   All other systems are negative; this includes Constitutional symptoms, Eyes, Cardiovascular, Ears, nose, mouth, throat, Respiratory, Gastrointestinal, Genitourinary, Musculoskeletal, Integumentary & Breast, Neurologic, Hematologic/Lymphatic, Allergic/Immunologic.    PAST MEDICAL HISTORY:      Current Medical Conditions:    As previously described.      Prior Operations and Hospitalizations:   Right inguinal hernia repair in 1965,  tonsillectomy 1974.    Medications and Allergies:  He has no known drug allergies.  He is taking Percocet 10/325 q.i.d., Metformin 500 mg. b.i.d., Xanax 0.5 mg. t.i.d. p.r.n., Wellbutrin SR 150 mg. b.i.d., Prilosec 20 mg. q.d., Folic acid 1 mg. q.d., aspirin 81 mg. q.d., Ambien 10 mg. p.r.n., Methotrexate 2.5 mg. q. weekly.    Height and Weight:     Currently the patient is 310 pounds and  6'2" tall.      SOCIAL HISTORY:    He denies tobacco, alcohol or drug use.  He stopped smoking on 09/16/2011.     DIAGNOSTIC STUDIES:   He had an MRI of his cervical spine along with plain radiographs obtained in the office today.  Plain radiographs demonstrate a solid arthrodesis at the previously operated C3-C5 levels and with some spondylosis at C6-7 levels.  His MRI shows that he has a central disc herniation at C6-7 with canal compromise and cord compression at this level and that at C7-T1 there is a large laterally based disc herniation on the right causing right C8 nerve root compression.  There is a small amount of central dis protrusion and mild spinal stenosis at C5-6 and the previously operated levels appear to be well healed without significant structural pathology.      PHYSICAL EXAMINATION:      General Appearance:   Mr. Voorhees is a pleasant and cooperative obese man in no acute distress.        Blood Pressure, Pulse, Respirations:   128/88, heart rate 78  and regular, respirations 16.     HEENT - normocephalic, atraumatic.  The pupils are equal, round and reactive to light.  The extraocular muscles are intact.  Sclerae - white.  Conjunctiva - pink.  Oropharynx benign.  Uvula midline.     Neck - he has a positive Spurlings' maneuver to the right and negative to the left.  Negative Lhermitte's sign with axial compression.  He has a healed anterior cervical incision.      Respiratory - there is normal respiratory effort with good intercostal function.  Lungs are clear to auscultation.  There are no  rales, rhonchi or wheezes.      Cardiovascular - the heart has regular rate and rhythm to auscultation.  No murmurs are appreciated.  There is no extremity edema, cyanosis or clubbing.  There are palpable pedal pulses.      Abdomen - obese, soft, nontender, no hepatosplenomegaly appreciated or masses.  There are active bowel sounds.  No guarding or rebound.      Musculoskeletal Examination - he has healed bilateral carpal tunnel surgical incisions which are well healed.  He has no evidence of carpal tunnel syndrome on examination with negative Tinel's and Phalen's signs at the wrists.  He has mildly positive Tinel's sign at the elbows.   NEUROLOGICAL EXAMINATION: The patient is oriented to time, person and place and has good recall of both recent and remote memory with normal attention span and concentration.  The patient speaks with clear and fluent speech and exhibits normal language function and appropriate fund of knowledge.      Cranial Nerve Examination - pupils are equal, round and reactive to light.  Extraocular movements are full.  Visual fields are full to confrontational testing.  Facial sensation and facial movement are symmetric and intact.  Hearing is intact to finger rub.  Palate is upgoing.  Shoulder shrug is symmetric.  Tongue protrudes in the midline.      Motor Examination - motor strength is 5/5 in the bilateral deltoids, biceps, triceps, handgrips, wrist extensors, interosseous with the exception of right triceps 4/5, right hand intrinsic and finger extension strength at 4-/5.  Otherwise all motor groups are full and bilaterally symmetric.      Sensory Examination - on sensory examination he notes decreased pin sensation the entire right hand with splitting of his right fourth and fifth digits and more profound sensory loss in the right fourth and fifth digits.      Deep Tendon Reflexes - 1 in the biceps, triceps, and brachioradialis, 1 in the knees, 1 in the ankles.  The great  toes are downgoing to plantar stimulation.      Cerebellar Examination - normal coordination in upper and lower extremities and normal rapid alternating movements.  Romberg test is negative.    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  He has a large disc herniation at C7-T1 on the right.  He has central disc protrusion at C6-7 level. I do not understand why he was told that this was a pre-existing condition and he would not qualify for [facility] Comp because this is not an adjacent segment in his cervical spine and even if it were, he was completely healed following that episode of care.  He now has a large disc herniation at C7-T1 on the right as well as central canal compromise and cord compression.    Based on his examination  and imaging findings, I have recommended that he undergo anterior cervical decompression and fusion at C6-7 level and hopefully we will be able to access the C7-T1 level from the right anteriorly and perform an anterior cervical decompression and fusion at this level as well.  If not, then we will need to do a posterior diskectomy at C7-T1 on the right but I do think the C6-7 level does need to be treated.  I explained to him that he does not have significant structural pathology at the C5-6 level. I do not think anything needs to be done at that level nor do I think anything needs to be done with his previous surgeries.    Risks and benefits were discussed in detail with the patient.  He wishes to proceed.  He was fitted for a cervical collar today.      [facility]    Danae Orleans. Venetia Maxon, M.D.  JDS:gde  cc: [facility]

## 2012-01-21 ENCOUNTER — Encounter (HOSPITAL_COMMUNITY): Payer: Self-pay | Admitting: Anesthesiology

## 2012-01-21 ENCOUNTER — Ambulatory Visit (HOSPITAL_COMMUNITY): Payer: BC Managed Care – PPO | Admitting: Anesthesiology

## 2012-01-21 ENCOUNTER — Inpatient Hospital Stay (HOSPITAL_COMMUNITY)
Admission: RE | Admit: 2012-01-21 | Discharge: 2012-01-22 | DRG: 865 | Disposition: A | Discharge status: Home / Self Care | Payer: BC Managed Care – PPO | Source: Ambulatory Visit | Attending: Neurosurgery | Admitting: Neurosurgery

## 2012-01-21 ENCOUNTER — Encounter (HOSPITAL_COMMUNITY): Payer: Self-pay | Admitting: *Deleted

## 2012-01-21 ENCOUNTER — Encounter (HOSPITAL_COMMUNITY)
Admission: RE | Disposition: A | Discharge status: Home / Self Care | Payer: Self-pay | Source: Ambulatory Visit | Attending: Neurosurgery

## 2012-01-21 ENCOUNTER — Ambulatory Visit (HOSPITAL_COMMUNITY): Payer: BC Managed Care – PPO

## 2012-01-21 DIAGNOSIS — M069 Rheumatoid arthritis, unspecified: Secondary | ICD-10-CM | POA: Diagnosis present

## 2012-01-21 DIAGNOSIS — Z87891 Personal history of nicotine dependence: Secondary | ICD-10-CM

## 2012-01-21 DIAGNOSIS — Z7982 Long term (current) use of aspirin: Secondary | ICD-10-CM

## 2012-01-21 DIAGNOSIS — Z79899 Other long term (current) drug therapy: Secondary | ICD-10-CM

## 2012-01-21 DIAGNOSIS — M4712 Other spondylosis with myelopathy, cervical region: Principal | ICD-10-CM | POA: Diagnosis present

## 2012-01-21 HISTORY — PX: ANTERIOR CERVICAL DECOMP/DISCECTOMY FUSION: SHX1161

## 2012-01-21 SURGERY — ANTERIOR CERVICAL DECOMPRESSION/DISCECTOMY FUSION 2 LEVELS
Anesthesia: General | Wound class: Clean

## 2012-01-21 MED ORDER — SODIUM CHLORIDE 0.9 % IR SOLN
Status: DC | PRN
  Administered 2012-01-21: 14:00:00

## 2012-01-21 MED ORDER — DEXAMETHASONE SODIUM PHOSPHATE 4 MG/ML IJ SOLN
INTRAMUSCULAR | Status: DC | PRN
  Administered 2012-01-21: 10 mg via INTRAVENOUS

## 2012-01-21 MED ORDER — ADULT MULTIVITAMIN W/MINERALS CH
1.0000 | ORAL_TABLET | Freq: Every day | ORAL | Status: DC
  Filled 2012-01-21: qty 1

## 2012-01-21 MED ORDER — MENTHOL 3 MG MT LOZG: 1.0000 | LOZENGE | OROMUCOSAL | Status: DC | PRN

## 2012-01-21 MED ORDER — NEOSTIGMINE METHYLSULFATE 1 MG/ML IJ SOLN
INTRAMUSCULAR | Status: DC | PRN
  Administered 2012-01-21: 5 mg via INTRAVENOUS

## 2012-01-21 MED ORDER — METHOTREXATE 2.5 MG PO TABS: 25.0000 mg | ORAL_TABLET | ORAL | Status: DC

## 2012-01-21 MED ORDER — SODIUM CHLORIDE 0.9 % IV SOLN
INTRAVENOUS | Status: AC
  Filled 2012-01-21: qty 500

## 2012-01-21 MED ORDER — PHENYLEPHRINE HCL 10 MG/ML IJ SOLN
INTRAMUSCULAR | Status: DC | PRN
  Administered 2012-01-21: 120 ug via INTRAVENOUS
  Administered 2012-01-21 (×2): 80 ug via INTRAVENOUS

## 2012-01-21 MED ORDER — PROPOFOL 10 MG/ML IV EMUL
INTRAVENOUS | Status: DC | PRN
  Administered 2012-01-21: 200 mg via INTRAVENOUS

## 2012-01-21 MED ORDER — BACITRACIN 50000 UNITS IM SOLR
INTRAMUSCULAR | Status: AC
  Filled 2012-01-21: qty 1

## 2012-01-21 MED ORDER — DOCUSATE SODIUM 100 MG PO CAPS
100.0000 mg | ORAL_CAPSULE | Freq: Two times a day (BID) | ORAL | Status: DC
  Administered 2012-01-21: 100 mg via ORAL
  Filled 2012-01-21: qty 1

## 2012-01-21 MED ORDER — HEMOSTATIC AGENTS (NO CHARGE) OPTIME
TOPICAL | Status: DC | PRN
  Administered 2012-01-21: 1 via TOPICAL

## 2012-01-21 MED ORDER — LACTATED RINGERS IV SOLN
INTRAVENOUS | Status: DC | PRN
  Administered 2012-01-21 (×2): via INTRAVENOUS

## 2012-01-21 MED ORDER — ZOLPIDEM TARTRATE 10 MG PO TABS
10.0000 mg | ORAL_TABLET | Freq: Every evening | ORAL | Status: DC | PRN
  Administered 2012-01-22: 10 mg via ORAL
  Filled 2012-01-21: qty 1

## 2012-01-21 MED ORDER — CEFAZOLIN SODIUM 1-5 GM-% IV SOLN
1.0000 g | Freq: Three times a day (TID) | INTRAVENOUS | Status: AC
  Administered 2012-01-21 – 2012-01-22 (×2): 1 g via INTRAVENOUS
  Filled 2012-01-21 (×2): qty 50

## 2012-01-21 MED ORDER — BUPIVACAINE HCL (PF) 0.5 % IJ SOLN
INTRAMUSCULAR | Status: DC | PRN
  Administered 2012-01-21: 10 mL

## 2012-01-21 MED ORDER — GLYCOPYRROLATE 0.2 MG/ML IJ SOLN
INTRAMUSCULAR | Status: DC | PRN
  Administered 2012-01-21: .8 mg via INTRAVENOUS

## 2012-01-21 MED ORDER — 0.9 % SODIUM CHLORIDE (POUR BTL) OPTIME
TOPICAL | Status: DC | PRN
  Administered 2012-01-21: 1000 mL

## 2012-01-21 MED ORDER — ZOLPIDEM TARTRATE 10 MG PO TABS: 10.0000 mg | ORAL_TABLET | Freq: Every evening | ORAL | Status: DC | PRN

## 2012-01-21 MED ORDER — HYDROCODONE-ACETAMINOPHEN 5-325 MG PO TABS: 1.0000 | ORAL_TABLET | ORAL | Status: DC | PRN

## 2012-01-21 MED ORDER — MORPHINE SULFATE 4 MG/ML IJ SOLN: 0.0500 mg/kg | INTRAMUSCULAR | Status: DC | PRN

## 2012-01-21 MED ORDER — SODIUM CHLORIDE 0.9 % IJ SOLN: 3.0000 mL | Freq: Two times a day (BID) | INTRAMUSCULAR | Status: DC

## 2012-01-21 MED ORDER — SODIUM CHLORIDE 0.9 % IV SOLN: 250.0000 mL | INTRAVENOUS | Status: DC

## 2012-01-21 MED ORDER — PANTOPRAZOLE SODIUM 40 MG PO TBEC: 40.0000 mg | DELAYED_RELEASE_TABLET | Freq: Every day | ORAL | Status: DC

## 2012-01-21 MED ORDER — ACETAMINOPHEN 650 MG RE SUPP: 650.0000 mg | RECTAL | Status: DC | PRN

## 2012-01-21 MED ORDER — LACTATED RINGERS IV SOLN: INTRAVENOUS | Status: DC

## 2012-01-21 MED ORDER — MORPHINE SULFATE 2 MG/ML IJ SOLN
1.0000 mg | INTRAMUSCULAR | Status: DC | PRN
  Administered 2012-01-21 – 2012-01-22 (×3): 2 mg via INTRAVENOUS
  Filled 2012-01-21 (×2): qty 1

## 2012-01-21 MED ORDER — ALPRAZOLAM 0.5 MG PO TABS
0.5000 mg | ORAL_TABLET | Freq: Three times a day (TID) | ORAL | Status: DC | PRN
  Administered 2012-01-22: 0.5 mg via ORAL
  Filled 2012-01-21: qty 1

## 2012-01-21 MED ORDER — KCL IN DEXTROSE-NACL 20-5-0.45 MEQ/L-%-% IV SOLN
INTRAVENOUS | Status: DC
  Filled 2012-01-21 (×3): qty 1000

## 2012-01-21 MED ORDER — SODIUM CHLORIDE 0.9 % IJ SOLN: 3.0000 mL | INTRAMUSCULAR | Status: DC | PRN

## 2012-01-21 MED ORDER — ACETAMINOPHEN 325 MG PO TABS: 650.0000 mg | ORAL_TABLET | ORAL | Status: DC | PRN

## 2012-01-21 MED ORDER — LIDOCAINE HCL (CARDIAC) 20 MG/ML IV SOLN
INTRAVENOUS | Status: DC | PRN
  Administered 2012-01-21: 70 mg via INTRAVENOUS

## 2012-01-21 MED ORDER — MIDAZOLAM HCL 5 MG/5ML IJ SOLN
INTRAMUSCULAR | Status: DC | PRN
  Administered 2012-01-21: 2 mg via INTRAVENOUS

## 2012-01-21 MED ORDER — LIDOCAINE-EPINEPHRINE 1 %-1:100000 IJ SOLN
INTRAMUSCULAR | Status: DC | PRN
  Administered 2012-01-21: 10 mL

## 2012-01-21 MED ORDER — ONDANSETRON HCL 4 MG/2ML IJ SOLN
INTRAMUSCULAR | Status: DC | PRN
  Administered 2012-01-21: 4 mg via INTRAVENOUS

## 2012-01-21 MED ORDER — OXYCODONE-ACETAMINOPHEN 10-325 MG PO TABS: 1.0000 | ORAL_TABLET | ORAL | Status: DC | PRN

## 2012-01-21 MED ORDER — THROMBIN 5000 UNITS EX KIT
PACK | CUTANEOUS | Status: DC | PRN
  Administered 2012-01-21 (×2): 5000 [IU] via TOPICAL

## 2012-01-21 MED ORDER — PHENOL 1.4 % MT LIQD: 1.0000 | OROMUCOSAL | Status: DC | PRN

## 2012-01-21 MED ORDER — FOLIC ACID 1 MG PO TABS
1.0000 mg | ORAL_TABLET | Freq: Two times a day (BID) | ORAL | Status: DC
  Administered 2012-01-21: 1 mg via ORAL
  Filled 2012-01-21 (×3): qty 1

## 2012-01-21 MED ORDER — DIAZEPAM 5 MG PO TABS
5.0000 mg | ORAL_TABLET | Freq: Four times a day (QID) | ORAL | Status: DC | PRN
  Administered 2012-01-21: 5 mg via ORAL
  Filled 2012-01-21: qty 1

## 2012-01-21 MED ORDER — ONDANSETRON HCL 4 MG/2ML IJ SOLN: 4.0000 mg | INTRAMUSCULAR | Status: DC | PRN

## 2012-01-21 MED ORDER — MORPHINE SULFATE 2 MG/ML IJ SOLN
INTRAMUSCULAR | Status: AC
  Administered 2012-01-21: 2 mg via INTRAVENOUS
  Filled 2012-01-21: qty 1

## 2012-01-21 MED ORDER — FENTANYL CITRATE 0.05 MG/ML IJ SOLN
INTRAMUSCULAR | Status: DC | PRN
  Administered 2012-01-21: 200 ug via INTRAVENOUS
  Administered 2012-01-21 (×3): 50 ug via INTRAVENOUS

## 2012-01-21 MED ORDER — BUPROPION HCL ER (SR) 150 MG PO TB12
150.0000 mg | ORAL_TABLET | Freq: Every day | ORAL | Status: DC
  Filled 2012-01-21: qty 1

## 2012-01-21 MED ORDER — HYDROMORPHONE HCL PF 1 MG/ML IJ SOLN: 0.2500 mg | INTRAMUSCULAR | Status: DC | PRN

## 2012-01-21 MED ORDER — ROCURONIUM BROMIDE 100 MG/10ML IV SOLN
INTRAVENOUS | Status: DC | PRN
  Administered 2012-01-21 (×3): 10 mg via INTRAVENOUS
  Administered 2012-01-21: 50 mg via INTRAVENOUS
  Administered 2012-01-21: 10 mg via INTRAVENOUS

## 2012-01-21 MED ORDER — OXYCODONE-ACETAMINOPHEN 5-325 MG PO TABS
1.0000 | ORAL_TABLET | ORAL | Status: DC | PRN
  Administered 2012-01-22 (×2): 2 via ORAL
  Filled 2012-01-21 (×2): qty 2

## 2012-01-21 SURGICAL SUPPLY — 79 items
ADH SKN CLS APL DERMABOND .7 (GAUZE/BANDAGES/DRESSINGS) ×1
APL SKNCLS STERI-STRIP NONHPOA (GAUZE/BANDAGES/DRESSINGS)
BAG DECANTER FOR FLEXI CONT (MISCELLANEOUS) ×2 IMPLANT
BANDAGE GAUZE ELAST BULKY 4 IN (GAUZE/BANDAGES/DRESSINGS) IMPLANT
BENZOIN TINCTURE PRP APPL 2/3 (GAUZE/BANDAGES/DRESSINGS) IMPLANT
BIT DRILL 14MM (INSTRUMENTS) IMPLANT
BIT DRILL NEURO 2X3.1 SFT TUCH (MISCELLANEOUS) ×1 IMPLANT
BLADE SURG 15 STRL LF DISP TIS (BLADE) IMPLANT
BLADE SURG 15 STRL SS (BLADE) ×2
BLADE ULTRA TIP 2M (BLADE) ×1 IMPLANT
BLOCK PROFUSE SZ 6 (Bone Implant) ×1 IMPLANT
BUR BARREL STRAIGHT FLUTE 4.0 (BURR) ×2 IMPLANT
CAGE CERVICAL 9MM (Cage) ×2 IMPLANT
CANISTER SUCTION 2500CC (MISCELLANEOUS) ×2 IMPLANT
CLOTH BEACON ORANGE TIMEOUT ST (SAFETY) ×2 IMPLANT
CONT SPEC 4OZ CLIKSEAL STRL BL (MISCELLANEOUS) ×2 IMPLANT
COVER MAYO STAND STRL (DRAPES) ×2 IMPLANT
DERMABOND ADVANCED (GAUZE/BANDAGES/DRESSINGS) ×1
DERMABOND ADVANCED .7 DNX12 (GAUZE/BANDAGES/DRESSINGS) ×1 IMPLANT
DRAIN CHANNEL 10M FLAT 3/4 FLT (DRAIN) ×1 IMPLANT
DRAPE LAPAROTOMY 100X72 PEDS (DRAPES) ×2 IMPLANT
DRAPE MICROSCOPE LEICA (MISCELLANEOUS) IMPLANT
DRAPE POUCH INSTRU U-SHP 10X18 (DRAPES) ×2 IMPLANT
DRAPE PROXIMA HALF (DRAPES) IMPLANT
DRESSING TELFA 8X3 (GAUZE/BANDAGES/DRESSINGS) ×1 IMPLANT
DRILL 14MM (INSTRUMENTS) ×2
DRILL NEURO 2X3.1 SOFT TOUCH (MISCELLANEOUS) ×2
DURAPREP 6ML APPLICATOR 50/CS (WOUND CARE) ×2 IMPLANT
ELECT COATED BLADE 2.86 ST (ELECTRODE) ×2 IMPLANT
ELECT REM PT RETURN 9FT ADLT (ELECTROSURGICAL) ×2
ELECTRODE REM PT RTRN 9FT ADLT (ELECTROSURGICAL) ×1 IMPLANT
EVACUATOR SILICONE 100CC (DRAIN) ×1 IMPLANT
GAUZE SPONGE 4X4 16PLY XRAY LF (GAUZE/BANDAGES/DRESSINGS) IMPLANT
GLOVE BIO SURGEON STRL SZ8 (GLOVE) ×2 IMPLANT
GLOVE BIOGEL PI IND STRL 7.5 (GLOVE) IMPLANT
GLOVE BIOGEL PI IND STRL 8 (GLOVE) ×1 IMPLANT
GLOVE BIOGEL PI IND STRL 8.5 (GLOVE) ×1 IMPLANT
GLOVE BIOGEL PI INDICATOR 7.5 (GLOVE) ×1
GLOVE BIOGEL PI INDICATOR 8 (GLOVE) ×1
GLOVE BIOGEL PI INDICATOR 8.5 (GLOVE) ×1
GLOVE ECLIPSE 7.5 STRL STRAW (GLOVE) ×2 IMPLANT
GLOVE ECLIPSE 8.0 STRL XLNG CF (GLOVE) ×2 IMPLANT
GLOVE EXAM NITRILE LRG STRL (GLOVE) IMPLANT
GLOVE EXAM NITRILE MD LF STRL (GLOVE) IMPLANT
GLOVE EXAM NITRILE XL STR (GLOVE) IMPLANT
GLOVE EXAM NITRILE XS STR PU (GLOVE) IMPLANT
GOWN BRE IMP SLV AUR LG STRL (GOWN DISPOSABLE) ×1 IMPLANT
GOWN BRE IMP SLV AUR XL STRL (GOWN DISPOSABLE) ×3 IMPLANT
GOWN STRL REIN 2XL LVL4 (GOWN DISPOSABLE) ×2 IMPLANT
HEAD HALTER (SOFTGOODS) ×2 IMPLANT
KIT BASIN OR (CUSTOM PROCEDURE TRAY) ×2 IMPLANT
KIT ROOM TURNOVER OR (KITS) ×2 IMPLANT
NDL HYPO 18GX1.5 BLUNT FILL (NEEDLE) ×1 IMPLANT
NDL HYPO 25X1 1.5 SAFETY (NEEDLE) ×1 IMPLANT
NDL SPNL 22GX3.5 QUINCKE BK (NEEDLE) ×1 IMPLANT
NEEDLE HYPO 18GX1.5 BLUNT FILL (NEEDLE) ×2 IMPLANT
NEEDLE HYPO 25X1 1.5 SAFETY (NEEDLE) ×2 IMPLANT
NEEDLE SPNL 22GX3.5 QUINCKE BK (NEEDLE) ×2 IMPLANT
NS IRRIG 1000ML POUR BTL (IV SOLUTION) ×2 IMPLANT
PACK LAMINECTOMY NEURO (CUSTOM PROCEDURE TRAY) ×2 IMPLANT
PAD ARMBOARD 7.5X6 YLW CONV (MISCELLANEOUS) ×2 IMPLANT
PIN DISTRACTION 14MM (PIN) ×4 IMPLANT
Plate 2-Level 37mm ×1 IMPLANT
RUBBERBAND STERILE (MISCELLANEOUS) IMPLANT
SCREW 14MM (Screw) ×6 IMPLANT
SPONGE GAUZE 4X4 12PLY (GAUZE/BANDAGES/DRESSINGS) ×1 IMPLANT
SPONGE INTESTINAL PEANUT (DISPOSABLE) ×3 IMPLANT
SPONGE SURGIFOAM ABS GEL SZ50 (HEMOSTASIS) IMPLANT
STAPLER SKIN PROX WIDE 3.9 (STAPLE) IMPLANT
STRIP CLOSURE SKIN 1/2X4 (GAUZE/BANDAGES/DRESSINGS) IMPLANT
SUT VIC AB 3-0 SH 8-18 (SUTURE) ×2 IMPLANT
SYR 20ML ECCENTRIC (SYRINGE) ×2 IMPLANT
SYR 3ML LL SCALE MARK (SYRINGE) ×2 IMPLANT
SYR 5ML LL (SYRINGE) ×1 IMPLANT
TAPE CLOTH SURG 4X10 WHT LF (GAUZE/BANDAGES/DRESSINGS) ×1 IMPLANT
TOWEL OR 17X24 6PK STRL BLUE (TOWEL DISPOSABLE) ×2 IMPLANT
TOWEL OR 17X26 10 PK STRL BLUE (TOWEL DISPOSABLE) ×2 IMPLANT
TRAP SPECIMEN MUCOUS 40CC (MISCELLANEOUS) ×1 IMPLANT
WATER STERILE IRR 1000ML POUR (IV SOLUTION) ×2 IMPLANT

## 2012-01-21 NOTE — Progress Notes (Signed)
Patient awake and conversant.  MAEW with good strength apart from some persistent, but improved right hand intrinsic weakness.

## 2012-01-21 NOTE — Transfer of Care (Signed)
Immediate Anesthesia Transfer of Care Note  Patient: Dustin [name]  Procedure(s) Performed: Procedure(s) (LRB): ANTERIOR CERVICAL DECOMPRESSION/DISCECTOMY FUSION 2 LEVELS (N/A)  Patient Location: PACU  Anesthesia Type: General  Level of Consciousness: awake, alert  and oriented  Airway & Oxygen Therapy: Patient Spontanous Breathing and Patient connected to face mask oxygen  Post-op Assessment: Report given to PACU RN, Post -op Vital signs reviewed and stable and Patient moving all extremities X 4  Post vital signs: Reviewed and stable  Complications: No apparent anesthesia complications

## 2012-01-21 NOTE — Anesthesia Procedure Notes (Signed)
Procedure Name: Intubation Date/Time: 01/21/2012 1:26 PM Performed by: Sharlene Dory E Pre-anesthesia Checklist: Patient identified, Emergency Drugs available, Suction available, Patient being monitored and Timeout performed Patient Re-evaluated:Patient Re-evaluated prior to inductionOxygen Delivery Method: Circle system utilized Preoxygenation: Pre-oxygenation with 100% oxygen Intubation Type: IV induction Ventilation: Mask ventilation without difficulty Laryngoscope Size: Mac and 4 Grade View: Grade I Tube type: Oral Tube size: 7.5 mm Airway Equipment and Method: Stylet Placement Confirmation: ETT inserted through vocal cords under direct vision,  breath sounds checked- equal and bilateral and positive ETCO2 Secured at: 22 cm Tube secured with: Tape Dental Injury: Teeth and Oropharynx as per pre-operative assessment

## 2012-01-21 NOTE — Anesthesia Postprocedure Evaluation (Signed)
  Anesthesia Post-op Note  Patient: Dustin [name]  Procedure(s) Performed: Procedure(s) (LRB): ANTERIOR CERVICAL DECOMPRESSION/DISCECTOMY FUSION 2 LEVELS (N/A)  Patient Location: PACU  Anesthesia Type: General  Level of Consciousness: awake  Airway and Oxygen Therapy: Patient Spontanous Breathing  Post-op Pain: mild  Post-op Assessment: Post-op Vital signs reviewed  Post-op Vital Signs: Reviewed  Complications: No apparent anesthesia complications

## 2012-01-21 NOTE — Op Note (Signed)
01/21/2012  5:01 PM  PATIENT:  Dustin [name]  51 y.o. 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 cervical stenosis cervical radiculopathy C 6/7 and C 7/T1  PROCEDURE:  Procedure(s) (LRB): ANTERIOR CERVICAL DECOMPRESSION/DISCECTOMY FUSION 2 LEVELS C 6/7 and C 7/T1 with PEEK cages, autograft, allograft, plate (N/A)  SURGEON:  Surgeon(s) and Role:    * Maeola Harman, MD - Primary  PHYSICIAN ASSISTANT: Jeral Fruit, MD  ASSISTANTS: Poteat, RN   ANESTHESIA:   general  EBL:  Total I/O In: 1400 [I.V.:1400] Out: 25 [Blood:25]  BLOOD ADMINISTERED:none  DRAINS: (10) Jackson-Pratt drain(s) with closed bulb suction in the prevertebral space   LOCAL MEDICATIONS USED:  LIDOCAINE   SPECIMEN:  No Specimen  DISPOSITION OF SPECIMEN:  N/A  COUNTS:  YES  TOURNIQUET:  * No tourniquets in log *  DICTATION:DICTATION: Patient is 51 year old male with right arm pain and weakness with HNP, spondylosis, radiculopathy and myelopathy C 6/7 and C 7 / T1  PROCEDURE: Patient was brought to operating room and following the smooth and uncomplicated induction of general endotracheal anesthesia his head was placed on a horseshoe head holder he was placed in 10 pounds of Holter traction and his anterior neck was prepped and draped in usual sterile fashion. An incision was made on the left side of midline after infiltrating the skin and subcutaneous tissues with local lidocaine. The platysmal layer was incised and subplatysmal dissection was performed exposing the anterior border sternocleidomastoid muscle. Using blunt dissection the carotid sheath was kept lateral and trachea and esophagus kept medial exposing the anterior cervical spine. A bent spinal needle was placed it was felt to be the C5/6 level and this was adjacent  to the previously placed plate which went from C 3 through C 5 levels. An X ray did not visualize these levels (patient weighs 324 lbs). Longus coli muscles were taken down from the anterior cervical spine using electrocautery and key elevator and self-retaining retractor was placed exposing the C6/7 and C7/T1 levels. The interspaces were incised and a thorough discectomy was performed. Distraction pins were placed. Initially the C7/T1 level was operated. Uncinate spurs and central spondylitic ridges were drilled down with a high-speed drill. The spinal cord dura and both C8 nerve roots were widely decompressed.  This level was extremely difficult to access, requiring 65 mm retractor blades just to reach the anterior spine.  Hemostasis was assured. After trial sizing an 9 mm peek interbody cage was selected and packed with profuse block and autograft. This was tamped into position and countersunk appropriately. Attention was the paid to the C 6/7 level, where similar decompression was performed.  Uncinate spurs and central spondylitic ridges were drilled down with a high-speed drill. The spinal cord dura and both C7 nerve roots were widely decompressed. Hemostasis was assured. After trial sizing a 9 mm peek interbody cage was selected and packed with profuse block and autograft. This was tamped into position and countersunk appropriately.Distraction weight was removed. A 37 mm trestle luxe anterior cervical plate was affixed to the cervical spine with 14 mm variable-angle screws 2 at C6, 2 at C7 and 2 at T1. All screws were well-positioned and locking mechanisms were engaged. A final X ray was not obtained, since none of the operated levels would be visualizable. Soft tissues were inspected and found to be in good repair.  The wound was irrigated. The platysma layer was closed with 3-0 Vicryl stitches and the skin was reapproximated with 3-0 Vicryl subcuticular stitches. The wound was dressed with Dermabond. Counts  were correct at the end of the case. Patient was extubated and taken to recovery in stable and satisfactory condition.    PLAN OF CARE: Admit for overnight observation  PATIENT DISPOSITION:  PACU - hemodynamically stable.   Delay start of Pharmacological VTE agent (>24hrs) due to surgical blood loss or risk of bleeding: yes

## 2012-01-21 NOTE — Interval H&P Note (Signed)
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 After consideration of risks, benefits and other options for treatment, the patient has consented to  Procedure(s) (LRB): ANTERIOR CERVICAL DECOMPRESSION/DISCECTOMY FUSION 2 LEVELS (N/A) as a surgical intervention .  The patients' history has been reviewed, patient examined, no change in status, stable for surgery.  I have reviewed the patients' chart and labs.  Questions were answered to the patient's satisfaction.     Myrical Andujo D  Date of Initial H&P: 01/03/2012  History reviewed, patient examined, no change in status, stable for surgery.

## 2012-01-21 NOTE — Interval H&P Note (Signed)
History and Physical Interval Note:  01/21/2012 10:37 AM  Dustin [name]  has presented today for surgery, with the diagnosis of cervical herniated disc cervical spondylosis cervical stenosis cervical radiculopathy  The various methods of treatment have been discussed with the patient and family. After consideration of risks, benefits and other options for treatment, the patient has consented to  Procedure(s) (LRB): ANTERIOR CERVICAL DECOMPRESSION/DISCECTOMY FUSION 2 LEVELS (N/A) as a surgical intervention .  The patients' history has been reviewed, patient examined, no change in status, stable for surgery.  I have reviewed the patients' chart and labs.  Questions were answered to the patient's satisfaction.     Dustin [name] D  Date of Initial H&P: 01/03/2012  History reviewed, patient examined, no change in status, stable for surgery.

## 2012-01-21 NOTE — Anesthesia Preprocedure Evaluation (Addendum)
Anesthesia Evaluation  Patient identified by MRN, date of birth, ID band Patient awake    Reviewed: Allergy & Precautions, H&P , NPO status , Patient's Chart, lab work & pertinent test results  Airway Mallampati: I      Dental  (+) Teeth Intact   Pulmonary neg pulmonary ROS,  breath sounds clear to auscultation        Cardiovascular + angina Rhythm:Regular Rate:Normal     Neuro/Psych Anxiety negative neurological ROS     GI/Hepatic Neg liver ROS, hiatal hernia, GERD-  Controlled,  Endo/Other  Diabetes mellitus-  Renal/GU negative Renal ROS     Musculoskeletal  (+) Arthritis -, Rheumatoid disorders,    Abdominal   Peds  Hematology negative hematology ROS (+)   Anesthesia Other Findings   Reproductive/Obstetrics                         Anesthesia Physical Anesthesia Plan  ASA: III  Anesthesia Plan: General   Post-op Pain Management:    Induction: Intravenous  Airway Management Planned: Oral ETT  Additional Equipment:   Intra-op Plan:   Post-operative Plan: Possible Post-op intubation/ventilation  Informed Consent: I have reviewed the patients History and Physical, chart, labs and discussed the procedure including the risks, benefits and alternatives for the proposed anesthesia with the patient or authorized representative who has indicated his/her understanding and acceptance.     Plan Discussed with:   Anesthesia Plan Comments:         Anesthesia Quick Evaluation

## 2012-01-21 NOTE — Preoperative (Signed)
Beta Blockers   Reason not to administer Beta Blockers:Not Applicable 

## 2012-01-22 ENCOUNTER — Encounter (HOSPITAL_COMMUNITY): Payer: Self-pay | Admitting: Neurosurgery

## 2012-01-22 LAB — GLUCOSE, CAPILLARY

## 2012-01-22 NOTE — Progress Notes (Signed)
PT Cancellation Note  Treatment cancelled today due to pt Independent with mobility and plan for D/C to home today.  No PT needs per pt.  Will sign off.    Deitra Craine, Alison Murray 01/22/2012, 3:24 PM

## 2012-01-22 NOTE — Discharge Summary (Signed)
Physician Discharge Summary  Patient ID: Dustin [name] MRN: [id number] DOB/AGE: 53-Nov-1962 53 y.o.  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 *    Discharged Condition: good  Hospital Course: ACDF C6/7, C7/T1  Consults: None  Significant Diagnostic Studies: None  Treatments: surgery: ACDF C6/7, C7/T1   Discharge Exam: Blood pressure 131/83, pulse 83, temperature 98 F (36.7 C), temperature source Oral, resp. rate 20, SpO2 94.00%. Neurologic: Alert and oriented X 3, normal strength and tone. Normal symmetric reflexes. Normal coordination and gait Wound:CDI  Disposition: Home   Medication List  As of 01/22/2012  8:50 AM   STOP taking these medications         methotrexate 2.5 MG tablet         TAKE these medications         ALPRAZolam 0.5 MG tablet   Commonly known as: XANAX   Take 0.5 mg by mouth 3 (three) times daily as needed. For anxiety      aspirin 81 MG tablet   Take 81 mg by mouth daily.      buPROPion 150 MG 12 hr tablet   Commonly known as: WELLBUTRIN SR   Take 150 mg by mouth daily.      folic acid 1 MG tablet   Commonly known as: FOLVITE   Take 1 mg by mouth 2 (two) times daily.      mulitivitamin with minerals Tabs   Take 1 tablet by mouth daily.      oxyCODONE-acetaminophen 10-325 MG per tablet   Commonly known as: PERCOCET   Take 1 tablet by mouth every 4 (four) hours as needed. For pain      pantoprazole 40 MG tablet   Commonly known as: PROTONIX   Take 40 mg by mouth daily.      zolpidem 10 MG tablet   Commonly known as: AMBIEN   Take 10 mg by mouth at bedtime as needed.             Signed: Dorian Heckle,  MD 01/22/2012, 8:50 AM

## 2012-01-22 NOTE — Progress Notes (Signed)
Occupational Therapy Note  OT order received and appreciated. Per PT report, pt able to verbalize and demonstrate cervical precautions and at baseline with BADLs and functional mobility.  No acute OT needs.  01/22/2012 Cipriano Mile OTR/L Pager [phone] Office [phone]

## 2012-02-24 ENCOUNTER — Other Ambulatory Visit (INDEPENDENT_AMBULATORY_CARE_PROVIDER_SITE_OTHER): Payer: Self-pay | Admitting: Internal Medicine

## 2012-02-26 ENCOUNTER — Other Ambulatory Visit (INDEPENDENT_AMBULATORY_CARE_PROVIDER_SITE_OTHER): Payer: Self-pay | Admitting: Internal Medicine

## 2013-10-14 ENCOUNTER — Encounter (HOSPITAL_COMMUNITY): Payer: Self-pay | Admitting: Emergency Medicine

## 2013-10-14 ENCOUNTER — Emergency Department (HOSPITAL_COMMUNITY)
Admission: EM | Admit: 2013-10-14 | Discharge: 2013-10-14 | Disposition: A | Discharge status: Home / Self Care | Payer: BC Managed Care – PPO | Attending: Emergency Medicine | Admitting: Emergency Medicine

## 2013-10-14 ENCOUNTER — Emergency Department (HOSPITAL_COMMUNITY): Payer: BC Managed Care – PPO

## 2013-10-14 DIAGNOSIS — M129 Arthropathy, unspecified: Secondary | ICD-10-CM | POA: Insufficient documentation

## 2013-10-14 DIAGNOSIS — Z8611 Personal history of tuberculosis: Secondary | ICD-10-CM | POA: Insufficient documentation

## 2013-10-14 DIAGNOSIS — F410 Panic disorder [episodic paroxysmal anxiety] without agoraphobia: Secondary | ICD-10-CM | POA: Insufficient documentation

## 2013-10-14 DIAGNOSIS — Z79899 Other long term (current) drug therapy: Secondary | ICD-10-CM | POA: Insufficient documentation

## 2013-10-14 DIAGNOSIS — Z7982 Long term (current) use of aspirin: Secondary | ICD-10-CM | POA: Insufficient documentation

## 2013-10-14 DIAGNOSIS — Z862 Personal history of diseases of the blood and blood-forming organs and certain disorders involving the immune mechanism: Secondary | ICD-10-CM | POA: Insufficient documentation

## 2013-10-14 DIAGNOSIS — K219 Gastro-esophageal reflux disease without esophagitis: Secondary | ICD-10-CM | POA: Insufficient documentation

## 2013-10-14 DIAGNOSIS — F329 Major depressive disorder, single episode, unspecified: Secondary | ICD-10-CM | POA: Insufficient documentation

## 2013-10-14 DIAGNOSIS — R062 Wheezing: Secondary | ICD-10-CM | POA: Insufficient documentation

## 2013-10-14 DIAGNOSIS — F3289 Other specified depressive episodes: Secondary | ICD-10-CM | POA: Insufficient documentation

## 2013-10-14 DIAGNOSIS — R61 Generalized hyperhidrosis: Secondary | ICD-10-CM | POA: Insufficient documentation

## 2013-10-14 DIAGNOSIS — R079 Chest pain, unspecified: Secondary | ICD-10-CM | POA: Insufficient documentation

## 2013-10-14 DIAGNOSIS — M549 Dorsalgia, unspecified: Secondary | ICD-10-CM | POA: Insufficient documentation

## 2013-10-14 DIAGNOSIS — Z87891 Personal history of nicotine dependence: Secondary | ICD-10-CM | POA: Insufficient documentation

## 2013-10-14 LAB — COMPREHENSIVE METABOLIC PANEL
ALK PHOS: 54 U/L (ref 39–117)
ALT: 13 U/L (ref 0–53)
AST: 16 U/L (ref 0–37)
Albumin: 4.2 g/dL (ref 3.5–5.2)
BUN: 20 mg/dL (ref 6–23)
CO2: 28 mEq/L (ref 19–32)
Calcium: 9.4 mg/dL (ref 8.4–10.5)
Chloride: 102 mEq/L (ref 96–112)
Creatinine, Ser: 1.05 mg/dL (ref 0.50–1.35)
GFR, EST NON AFRICAN AMERICAN: 79 mL/min — AB (ref >90)
GLUCOSE: 138 mg/dL — AB (ref 70–99)
POTASSIUM: 4.5 meq/L (ref 3.7–5.3)
SODIUM: 140 meq/L (ref 137–147)
Total Bilirubin: 0.8 mg/dL (ref 0.3–1.2)
Total Protein: 7.9 g/dL (ref 6.0–8.3)

## 2013-10-14 LAB — CBC WITH DIFFERENTIAL/PLATELET
Basophils Absolute: 0 10*3/uL (ref 0.0–0.1)
Basophils Relative: 1 % (ref 0–1)
Eosinophils Absolute: 0.1 10*3/uL (ref 0.0–0.7)
Eosinophils Relative: 2 % (ref 0–5)
HCT: 44.5 % (ref 39.0–52.0)
HEMOGLOBIN: 14.9 g/dL (ref 13.0–17.0)
LYMPHS ABS: 1.9 10*3/uL (ref 0.7–4.0)
LYMPHS PCT: 24 % (ref 12–46)
MCH: 33.2 pg (ref 26.0–34.0)
MCHC: 33.5 g/dL (ref 30.0–36.0)
MCV: 99.1 fL (ref 78.0–100.0)
MONOS PCT: 6 % (ref 3–12)
Monocytes Absolute: 0.5 10*3/uL (ref 0.1–1.0)
NEUTROS PCT: 68 % (ref 43–77)
Neutro Abs: 5.6 10*3/uL (ref 1.7–7.7)
PLATELETS: 295 10*3/uL (ref 150–400)
RBC: 4.49 MIL/uL (ref 4.22–5.81)
RDW: 13.2 % (ref 11.5–15.5)
WBC: 8.2 10*3/uL (ref 4.0–10.5)

## 2013-10-14 LAB — TROPONIN I: Troponin I: <0.3 ng/mL (ref <0.30)

## 2013-10-14 LAB — D-DIMER, QUANTITATIVE: D-Dimer, Quant: 0.4 ug/mL-FEU (ref 0.00–0.48)

## 2013-10-14 MED ORDER — ALBUTEROL SULFATE (2.5 MG/3ML) 0.083% IN NEBU
5.0000 mg | INHALATION_SOLUTION | Freq: Once | RESPIRATORY_TRACT | Status: AC
  Administered 2013-10-14: 5 mg via RESPIRATORY_TRACT
  Filled 2013-10-14: qty 6

## 2013-10-14 MED ORDER — PREDNISONE 20 MG PO TABS: 60.0000 mg | ORAL_TABLET | Freq: Every day | ORAL | Status: DC

## 2013-10-14 MED ORDER — PREDNISONE 50 MG PO TABS
60.0000 mg | ORAL_TABLET | ORAL | Status: AC
  Administered 2013-10-14: 60 mg via ORAL
  Filled 2013-10-14 (×2): qty 1

## 2013-10-14 NOTE — Discharge Instructions (Signed)
As discussed, your evaluation tonight has been largely reassuring.  It is important to follow up with your primary care physician.  Return here for concerning changes in your condition.   Chest Pain (Nonspecific) It is often hard to give a specific diagnosis for the cause of chest pain. There is always a chance that your pain could be related to something serious, such as a heart attack or a blood clot in the lungs. You need to follow up with your caregiver for further evaluation. CAUSES   Heartburn.  Pneumonia or bronchitis.  Anxiety or stress.  Inflammation around your heart (pericarditis) or lung (pleuritis or pleurisy).  A blood clot in the lung.  A collapsed lung (pneumothorax). It can develop suddenly on its own (spontaneous pneumothorax) or from injury (trauma) to the chest.  Shingles infection (herpes zoster virus). The chest wall is composed of bones, muscles, and cartilage. Any of these can be the source of the pain.  The bones can be bruised by injury.  The muscles or cartilage can be strained by coughing or overwork.  The cartilage can be affected by inflammation and become sore (costochondritis). DIAGNOSIS  Lab tests or other studies, such as X-rays, electrocardiography, stress testing, or cardiac imaging, may be needed to find the cause of your pain.  TREATMENT   Treatment depends on what may be causing your chest pain. Treatment may include:  Acid blockers for heartburn.  Anti-inflammatory medicine.  Pain medicine for inflammatory conditions.  Antibiotics if an infection is present.  You may be advised to change lifestyle habits. This includes stopping smoking and avoiding alcohol, caffeine, and chocolate.  You may be advised to keep your head raised (elevated) when sleeping. This reduces the chance of acid going backward from your stomach into your esophagus.  Most of the time, nonspecific chest pain will improve within 2 to 3 days with rest and mild  pain medicine. HOME CARE INSTRUCTIONS   If antibiotics were prescribed, take your antibiotics as directed. Finish them even if you start to feel better.  For the next few days, avoid physical activities that bring on chest pain. Continue physical activities as directed.  Do not smoke.  Avoid drinking alcohol.  Only take over-the-counter or prescription medicine for pain, discomfort, or fever as directed by your caregiver.  Follow your caregiver's suggestions for further testing if your chest pain does not go away.  Keep any follow-up appointments you made. If you do not go to an appointment, you could develop lasting (chronic) problems with pain. If there is any problem keeping an appointment, you must call to reschedule. SEEK MEDICAL CARE IF:   You think you are having problems from the medicine you are taking. Read your medicine instructions carefully.  Your chest pain does not go away, even after treatment.  You develop a rash with blisters on your chest. SEEK IMMEDIATE MEDICAL CARE IF:   You have increased chest pain or pain that spreads to your arm, neck, jaw, back, or abdomen.  You develop shortness of breath, an increasing cough, or you are coughing up blood.  You have severe back or abdominal pain, feel nauseous, or vomit.  You develop severe weakness, fainting, or chills.  You have a fever. THIS IS AN EMERGENCY. Do not wait to see if the pain will go away. Get medical help at once. Call your local emergency services (911 in [place] Do not drive yourself to the hospital. MAKE SURE YOU:   Understand these instructions.  Will  watch your condition.  Will get help right away if you are not doing well or get worse. Document Released: 06/12/2005 Document Revised: 11/25/2011 Document Reviewed: 04/07/2008 [facility] Patient Information 2014 [place].

## 2013-10-14 NOTE — ED Notes (Signed)
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  With sweating and lt arm pain.

## 2013-10-14 NOTE — ED Provider Notes (Signed)
CSN: [id number]     Arrival date & time 10/14/13  1430 History  This chart was scribed for Carmin Muskrat, MD by Rolanda Lundborg, ED Scribe. This patient was seen in room APA19/APA19 and the patient's care was started at 5:53 PM.    Chief Complaint  Patient presents with  . Chest Pain   The history is provided by the patient. No language interpreter was used.   HPI Comments: Dustin [name] is a 53 y.o. 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 Pt reports it lasted one second then went away completely. He reports a second episode with diaphoresis one minute later. He also reports constant pain in his middle back onset several days ago which he attributes to his job. He reports he is under severe stress for financial reasons. He reports a h/o CP due to reflux. Pt is a smoker but does not drink.    Past Medical History  Diagnosis Date  . Angina     panic attacks- cardiac tests 10  . 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 GERD (gastroesophageal reflux disease)   . H/O hiatal hernia   . Arthritis     rh  . Anxiety   . Depression   . Tuberculosis     hx tx   Past Surgical History  Procedure Laterality Date  . Cervical disc surgery  2  . Tonsillectomy  72  . Hernia repair  60's  . Anterior cervical decomp/discectomy fusion  01/21/2012    Procedure: ANTERIOR CERVICAL DECOMPRESSION/DISCECTOMY FUSION 2 LEVELS;  Surgeon: Erline Levine, MD;  Location: [facility];  Service: Neurosurgery;  Laterality: N/A;  Cervical Six-Seven, Cervical Seven-Thoracic One, Anterior Cervical Decompression with Fusion Interbody Prothesis Plating and Bonegraft possible posterior Cervical Seven-Thoracic One Foraminotomy   History reviewed. No pertinent family history. History  Substance Use Topics  . Smoking status: Former Smoker -- 1.50 packs/day for  30 years    Quit date: 09/15/2011  . Smokeless tobacco: Not on file  . Alcohol Use: No    Review of Systems  Constitutional:       Per HPI, otherwise negative  HENT:       Per HPI, otherwise negative  Respiratory:       Per HPI, otherwise negative  Cardiovascular:       Per HPI, otherwise negative  Gastrointestinal: Negative for vomiting.  Endocrine:       Negative aside from HPI  Genitourinary:       Neg aside from HPI   Musculoskeletal:       Per HPI, otherwise negative  Skin: Negative.   Neurological: Negative for syncope.    Allergies  Review of patient's allergies indicates no known allergies.  Home Medications   Current Outpatient Rx  Name  Route  Sig  Dispense  Refill  . ALPRAZolam (XANAX) 0.5 MG tablet   Oral   Take 0.5 mg by mouth 3 (three) times daily as needed. For anxiety         . aspirin 81 MG tablet   Oral   Take 81 mg by mouth daily.         Marland Kitchen buPROPion (WELLBUTRIN SR) 150 MG 12 hr tablet   Oral   Take 150 mg by mouth daily.         Marland Kitchen  folic acid (FOLVITE) 1 MG tablet   Oral   Take 1 mg by mouth 2 (two) times daily.         . Multiple Vitamin (MULITIVITAMIN WITH MINERALS) TABS   Oral   Take 1 tablet by mouth daily.         Marland Kitchen oxyCODONE-acetaminophen (PERCOCET) 10-325 MG per tablet   Oral   Take 1 tablet by mouth every 4 (four) hours as needed. For pain         . pantoprazole (PROTONIX) 40 MG tablet   Oral   Take 40 mg by mouth daily.         Marland Kitchen zolpidem (AMBIEN) 10 MG tablet   Oral   Take 10 mg by mouth at bedtime as needed.          BP 129/78  Pulse 91  Temp(Src) 98 F (36.7 C) (Oral)  Resp 20  Ht 6\' 1"  (1.854 m)  Wt 270 lb (122.471 kg)  BMI 35.63 kg/m2  SpO2 98% Physical Exam  Nursing note and vitals reviewed. Constitutional: He is oriented to person, place, and time. He appears well-developed. No distress.  HENT:  Head: Normocephalic and atraumatic.  Eyes: Conjunctivae and EOM are normal.  Cardiovascular:  Normal rate, regular rhythm and intact distal pulses.   Pulmonary/Chest: Effort normal. No stridor. No respiratory distress. He has wheezes (mild).  Abdominal: He exhibits no distension.  Musculoskeletal: He exhibits no edema.  Mid thoracic not TTP  Neurological: He is alert and oriented to person, place, and time. He exhibits normal muscle tone.  Skin: Skin is warm and dry.  Psychiatric: He has a normal mood and affect.    ED Course  Procedures (including critical care time) Medications  albuterol (PROVENTIL) (2.5 MG/3ML) 0.083% nebulizer solution 5 mg (5 mg Nebulization Given 10/14/13 1820)    COORDINATION OF CARE: 6:03 PM- Discussed treatment plan with pt. Pt agrees to plan.  6:58 PM - Rechecked with pt to let him know that all blood work and imaging is normal. Suspect inflammation of the chest wall or esophageal ulcer irritation. Recommend f/u with PCP next week. Pt will be discharged home and pt agrees to plan.    Labs Review Labs Reviewed  COMPREHENSIVE METABOLIC PANEL - Abnormal; Notable for the following:    Glucose, Bld 138 (*)    GFR calc non Af Amer 79 (*)    All other components within normal limits  CBC WITH DIFFERENTIAL  TROPONIN I  D-DIMER, QUANTITATIVE   Imaging Review Dg Chest 2 View  10/14/2013   CLINICAL DATA:  Chest pain and shortness of breath.  EXAM: CHEST  2 VIEW  COMPARISON:  Chest radiograph January 14, 2012.  FINDINGS: Cardiomediastinal silhouette is unremarkable. The lungs are clear without pleural effusions or focal consolidations. Pulmonary vasculature is unremarkable. Trachea projects midline and there is no pneumothorax. Soft tissue planes and included osseous structures are nonsuspicious. Status post ACDF. Mild degenerative change of thoracic spine.  IMPRESSION: No acute cardiopulmonary process.   Electronically Signed   By: Elon Alas   On: 10/14/2013 15:46    EKG Interpretation    Date/Time:  Thursday October 14 2013 15:12:35  EST Ventricular Rate:  79 PR Interval:  140 QRS Duration: 94 QT Interval:  370 QTC Calculation: 424 R Axis:   79 Text Interpretation:  Normal sinus rhythm Normal ECG When compared with ECG of 14-Jan-2012 11:58, No significant change was found Sinus rhythm Normal ECG Confirmed by Carmin Muskrat  MD [phone]) on 10/14/2013 5:36:58 PM             MDM   1. Chest pain    I personally performed the services described in this documentation, which was scribed in my presence. The recorded information has been reviewed and is accurate.    Patient presents after several episodes of chest pain.  Notably, the patient does have history of anxiety, esophageal pathology.  The patient has had multiple clean  heart catheterizations in the past years, and this evaluation is largely reassuring, with no evidence of coronary ischemia, dissection, PE.  With no new symptoms were reassuring finding, the patient was discharged to follow up with primary care physician for additional evaluation.  Patient started on a course of steroids given his smoking history, his description of ongoing chronic discomfort.    Carmin Muskrat, MD 10/14/13 [phone]

## 2014-09-22 ENCOUNTER — Emergency Department (HOSPITAL_COMMUNITY): Payer: BLUE CROSS/BLUE SHIELD

## 2014-09-22 ENCOUNTER — Encounter (HOSPITAL_COMMUNITY): Payer: Self-pay | Admitting: Emergency Medicine

## 2014-09-22 ENCOUNTER — Observation Stay (HOSPITAL_COMMUNITY)
Admission: EM | Admit: 2014-09-22 | Discharge: 2014-09-24 | Disposition: A | Discharge status: Home / Self Care | Payer: BLUE CROSS/BLUE SHIELD | Attending: Internal Medicine | Admitting: Internal Medicine

## 2014-09-22 DIAGNOSIS — Z7982 Long term (current) use of aspirin: Secondary | ICD-10-CM | POA: Diagnosis not present

## 2014-09-22 DIAGNOSIS — Z87891 Personal history of nicotine dependence: Secondary | ICD-10-CM | POA: Diagnosis not present

## 2014-09-22 DIAGNOSIS — Z79899 Other long term (current) drug therapy: Secondary | ICD-10-CM | POA: Insufficient documentation

## 2014-09-22 DIAGNOSIS — R0789 Other chest pain: Secondary | ICD-10-CM

## 2014-09-22 DIAGNOSIS — Z720 Tobacco use: Secondary | ICD-10-CM | POA: Diagnosis present

## 2014-09-22 DIAGNOSIS — I25119 Atherosclerotic heart disease of native coronary artery with unspecified angina pectoris: Secondary | ICD-10-CM | POA: Diagnosis not present

## 2014-09-22 DIAGNOSIS — M069 Rheumatoid arthritis, unspecified: Secondary | ICD-10-CM | POA: Diagnosis present

## 2014-09-22 DIAGNOSIS — R079 Chest pain, unspecified: Principal | ICD-10-CM | POA: Diagnosis present

## 2014-09-22 DIAGNOSIS — F419 Anxiety disorder, unspecified: Secondary | ICD-10-CM | POA: Insufficient documentation

## 2014-09-22 DIAGNOSIS — R110 Nausea: Secondary | ICD-10-CM | POA: Diagnosis not present

## 2014-09-22 DIAGNOSIS — F329 Major depressive disorder, single episode, unspecified: Secondary | ICD-10-CM | POA: Diagnosis not present

## 2014-09-22 DIAGNOSIS — Z8249 Family history of ischemic heart disease and other diseases of the circulatory system: Secondary | ICD-10-CM

## 2014-09-22 DIAGNOSIS — Z7952 Long term (current) use of systemic steroids: Secondary | ICD-10-CM | POA: Insufficient documentation

## 2014-09-22 DIAGNOSIS — Z8611 Personal history of tuberculosis: Secondary | ICD-10-CM | POA: Diagnosis not present

## 2014-09-22 DIAGNOSIS — K219 Gastro-esophageal reflux disease without esophagitis: Secondary | ICD-10-CM | POA: Insufficient documentation

## 2014-09-22 DIAGNOSIS — R739 Hyperglycemia, unspecified: Secondary | ICD-10-CM

## 2014-09-22 LAB — CBC WITH DIFFERENTIAL/PLATELET
BASOS PCT: 0 % (ref 0–1)
Basophils Absolute: 0 10*3/uL (ref 0.0–0.1)
Eosinophils Absolute: 0.1 10*3/uL (ref 0.0–0.7)
Eosinophils Relative: 2 % (ref 0–5)
HCT: 41.5 % (ref 39.0–52.0)
Hemoglobin: 13.6 g/dL (ref 13.0–17.0)
Lymphocytes Relative: 20 % (ref 12–46)
Lymphs Abs: 1.7 10*3/uL (ref 0.7–4.0)
MCH: 32.5 pg (ref 26.0–34.0)
MCHC: 32.8 g/dL (ref 30.0–36.0)
MCV: 99.3 fL (ref 78.0–100.0)
MONO ABS: 0.5 10*3/uL (ref 0.1–1.0)
Monocytes Relative: 6 % (ref 3–12)
NEUTROS ABS: 6.1 10*3/uL (ref 1.7–7.7)
Neutrophils Relative %: 72 % (ref 43–77)
Platelets: 214 10*3/uL (ref 150–400)
RBC: 4.18 MIL/uL — ABNORMAL LOW (ref 4.22–5.81)
RDW: 13.5 % (ref 11.5–15.5)
WBC: 8.5 10*3/uL (ref 4.0–10.5)

## 2014-09-22 LAB — COMPREHENSIVE METABOLIC PANEL
ALK PHOS: 51 U/L (ref 39–117)
ALT: 24 U/L (ref 0–53)
AST: 23 U/L (ref 0–37)
Albumin: 3.9 g/dL (ref 3.5–5.2)
Anion gap: 5 (ref 5–15)
BILIRUBIN TOTAL: 0.8 mg/dL (ref 0.3–1.2)
BUN: 20 mg/dL (ref 6–23)
CHLORIDE: 105 meq/L (ref 96–112)
CO2: 29 mmol/L (ref 19–32)
Calcium: 8.8 mg/dL (ref 8.4–10.5)
Creatinine, Ser: 1.11 mg/dL (ref 0.50–1.35)
GFR calc Af Amer: 85 mL/min — ABNORMAL LOW (ref >90)
GFR calc non Af Amer: 73 mL/min — ABNORMAL LOW (ref >90)
GLUCOSE: 211 mg/dL — AB (ref 70–99)
POTASSIUM: 4.6 mmol/L (ref 3.5–5.1)
Sodium: 139 mmol/L (ref 135–145)
Total Protein: 6.9 g/dL (ref 6.0–8.3)

## 2014-09-22 LAB — TROPONIN I

## 2014-09-22 LAB — D-DIMER, QUANTITATIVE: D-Dimer, Quant: 0.41 ug/mL-FEU (ref 0.00–0.48)

## 2014-09-22 LAB — I-STAT TROPONIN, ED: Troponin i, poc: 0 ng/mL (ref 0.00–0.08)

## 2014-09-22 MED ORDER — ONDANSETRON HCL 4 MG/2ML IJ SOLN: 4.0000 mg | Freq: Four times a day (QID) | INTRAMUSCULAR | Status: DC | PRN

## 2014-09-22 MED ORDER — ASPIRIN EC 81 MG PO TBEC
81.0000 mg | DELAYED_RELEASE_TABLET | Freq: Every day | ORAL | Status: DC
  Administered 2014-09-23 – 2014-09-24 (×2): 81 mg via ORAL
  Filled 2014-09-22 (×2): qty 1

## 2014-09-22 MED ORDER — ZOLPIDEM TARTRATE 5 MG PO TABS
10.0000 mg | ORAL_TABLET | Freq: Every evening | ORAL | Status: DC | PRN
  Administered 2014-09-22 – 2014-09-23 (×2): 10 mg via ORAL
  Filled 2014-09-22 (×2): qty 2

## 2014-09-22 MED ORDER — PANTOPRAZOLE SODIUM 40 MG PO TBEC
40.0000 mg | DELAYED_RELEASE_TABLET | Freq: Every day | ORAL | Status: DC
  Administered 2014-09-23 – 2014-09-24 (×2): 40 mg via ORAL
  Filled 2014-09-22 (×2): qty 1

## 2014-09-22 MED ORDER — FOLIC ACID 1 MG PO TABS
1.0000 mg | ORAL_TABLET | Freq: Two times a day (BID) | ORAL | Status: DC
  Administered 2014-09-22 – 2014-09-24 (×4): 1 mg via ORAL
  Filled 2014-09-22 (×4): qty 1

## 2014-09-22 MED ORDER — ACETAMINOPHEN 325 MG PO TABS: 650.0000 mg | ORAL_TABLET | ORAL | Status: DC | PRN

## 2014-09-22 MED ORDER — HEPARIN SODIUM (PORCINE) 5000 UNIT/ML IJ SOLN
5000.0000 [IU] | Freq: Three times a day (TID) | INTRAMUSCULAR | Status: DC
Start: 2014-09-22 — End: 2014-09-24
  Filled 2014-09-22: qty 1

## 2014-09-22 MED ORDER — OXYCODONE-ACETAMINOPHEN 5-325 MG PO TABS: 1.0000 | ORAL_TABLET | ORAL | Status: DC | PRN

## 2014-09-22 MED ORDER — BUPROPION HCL ER (SR) 150 MG PO TB12
150.0000 mg | ORAL_TABLET | Freq: Every day | ORAL | Status: DC
  Administered 2014-09-23 – 2014-09-24 (×2): 150 mg via ORAL
  Filled 2014-09-22 (×2): qty 1

## 2014-09-22 MED ORDER — OXYCODONE-ACETAMINOPHEN 10-325 MG PO TABS: 1.0000 | ORAL_TABLET | ORAL | Status: DC | PRN

## 2014-09-22 MED ORDER — OXYCODONE HCL 5 MG PO TABS: 5.0000 mg | ORAL_TABLET | ORAL | Status: DC | PRN

## 2014-09-22 MED ORDER — ALPRAZOLAM 0.5 MG PO TABS
1.0000 mg | ORAL_TABLET | Freq: Three times a day (TID) | ORAL | Status: DC | PRN
  Administered 2014-09-22 – 2014-09-23 (×3): 1 mg via ORAL
  Filled 2014-09-22 (×3): qty 2

## 2014-09-22 MED ORDER — ASPIRIN 81 MG PO CHEW
324.0000 mg | CHEWABLE_TABLET | Freq: Once | ORAL | Status: AC
Start: 2014-09-22 — End: 2014-09-22
  Administered 2014-09-22: 324 mg via ORAL
  Filled 2014-09-22: qty 4

## 2014-09-22 MED ORDER — NICOTINE 21 MG/24HR TD PT24
21.0000 mg | MEDICATED_PATCH | Freq: Every day | TRANSDERMAL | Status: DC
  Filled 2014-09-22: qty 1
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
## 2014-09-22 NOTE — ED Provider Notes (Signed)
CSN: [id number]     Arrival date & time 09/22/14  1213 History   First MD Initiated Contact with Patient 09/22/14 1310     Chief Complaint  Patient presents with  . Chest Pain     (Consider location/radiation/quality/duration/timing/severity/associated sxs/prior Treatment) HPI Complains of anterior chest pain described as sharp, severe lasting a few seconds yesterday followed by nonradiating anterior chest pressure. Other associated symptoms include nausea and sweatiness. Nothing makes symptoms better or worse. No treatment prior to coming here He is presently asymptomatic. He had a similar episode 2 months ago however did not seek medical care. Patient followed by cardiology last time 2012 had catheterization May 2012 which showed no significant coronary disease. He reports that he was released from cardiology service. He has no history of coronary stents Past Medical History  Diagnosis Date  . Angina     panic attacks- cardiac tests 12  . 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 GERD (gastroesophageal reflux disease)   . H/O hiatal hernia   . Arthritis     rh  . Anxiety   . Depression   . Tuberculosis     hx tx  . Coronary artery disease    Past Surgical History  Procedure Laterality Date  . Cervical disc surgery  04  . Tonsillectomy  72  . Hernia repair  60's  . Anterior cervical decomp/discectomy fusion  01/21/2012    Procedure: ANTERIOR CERVICAL DECOMPRESSION/DISCECTOMY FUSION 2 LEVELS;  Surgeon: Erline Levine, MD;  Location: [facility];  Service: Neurosurgery;  Laterality: N/A;  Cervical Six-Seven, Cervical Seven-Thoracic One, Anterior Cervical Decompression with Fusion Interbody Prothesis Plating and Bonegraft possible posterior Cervical Seven-Thoracic One Foraminotomy   History reviewed. No pertinent family history. History  Substance Use Topics  . Smoking status: Former Smoker -- 1.50 packs/day for 30 years    Quit date: 09/15/2011  . Smokeless tobacco:  Not on file  . Alcohol Use: No    Review of Systems  Cardiovascular: Positive for chest pain.  Gastrointestinal: Positive for nausea.  All other systems reviewed and are negative.     Allergies  Review of patient's allergies indicates no known allergies.  Home Medications   Prior to Admission medications   Medication Sig Start Date End Date Taking? Authorizing Provider  ALPRAZolam Duanne Moron) 0.5 MG tablet Take 0.5 mg by mouth 3 (three) times daily as needed. For anxiety    Historical Provider, MD  aspirin 81 MG tablet Take 81 mg by mouth daily.    Historical Provider, MD  buPROPion (WELLBUTRIN SR) 150 MG 12 hr tablet Take 150 mg by mouth daily.    Historical Provider, MD  folic acid (FOLVITE) 1 MG tablet Take 1 mg by mouth 2 (two) times daily.    Historical Provider, MD  Multiple Vitamin (MULITIVITAMIN WITH MINERALS) TABS Take 1 tablet by mouth daily.    Historical Provider, MD  oxyCODONE-acetaminophen (PERCOCET) 10-325 MG per tablet Take 1 tablet by mouth every 4 (four) hours as needed. For pain    Historical Provider, MD  pantoprazole (PROTONIX) 40 MG tablet Take 40 mg by mouth daily.    Historical Provider, MD  predniSONE (DELTASONE) 20 MG tablet Take 3 tablets (60 mg total) by mouth daily with breakfast. 10/15/13   Carmin Muskrat, MD  zolpidem (AMBIEN) 10 MG tablet Take 10 mg by mouth at bedtime as needed.    Historical Provider, MD   BP 137/79 mmHg  Pulse 104  Temp(Src) 97.3 F (36.3 C) (Oral)  Resp 18  SpO2 96% Physical Exam  Constitutional: He appears well-developed and well-nourished.  HENT:  Head: Normocephalic and atraumatic.  Eyes: Conjunctivae are normal. Pupils are equal, round, and reactive to light.  Neck: Neck supple. No tracheal deviation present. No thyromegaly present.  Cardiovascular: Normal rate and regular rhythm.   No murmur heard. Pulmonary/Chest: Effort normal and breath sounds normal.  Abdominal: Soft. Bowel sounds are normal. He exhibits no  distension. There is no tenderness.  Obese  Musculoskeletal: Normal range of motion. He exhibits no edema or tenderness.  Neurological: He is alert. Coordination normal.  Skin: Skin is warm and dry. No rash noted.  Psychiatric: He has a normal mood and affect. Thought content normal.  Nursing note and vitals reviewed.   ED Course  Procedures (including critical care time) Labs Review Labs Reviewed  CBC WITH DIFFERENTIAL - Abnormal; Notable for the following:    RBC 4.18 (*)    All other components within normal limits  COMPREHENSIVE METABOLIC PANEL - Abnormal; Notable for the following:    Glucose, Bld 211 (*)    GFR calc non Af Amer 73 (*)    GFR calc Af Amer 85 (*)    All other components within normal limits  I-STAT TROPOININ, ED    Imaging Review No results found.   EKG Interpretation   Date/Time:  Thursday September 22 2014 12:29:13 EST Ventricular Rate:  103 PR Interval:  122 QRS Duration: 86 QT Interval:  340 QTC Calculation: 445 R Axis:   61 Text Interpretation:  Sinus tachycardia Low voltage QRS Borderline ECG  SINCE LAST TRACING HEART RATE HAS INCREASED Confirmed by Winfred Leeds  MD,  Hanako Tipping [phone]) on 09/22/2014 1:31:51 PM     Chest x-ray viewed by me Results for orders placed or performed during the hospital encounter of 09/22/14  CBC with Differential  Result Value Ref Range   WBC 8.5 4.0 - 10.5 K/uL   RBC 4.18 (L) 4.22 - 5.81 MIL/uL   Hemoglobin 13.6 13.0 - 17.0 g/dL   HCT 41.5 39.0 - 52.0 %   MCV 99.3 78.0 - 100.0 fL   MCH 32.5 26.0 - 34.0 pg   MCHC 32.8 30.0 - 36.0 g/dL   RDW 13.5 11.5 - 15.5 %   Platelets 214 150 - 400 K/uL   Neutrophils Relative % 72 43 - 77 %   Neutro Abs 6.1 1.7 - 7.7 K/uL   Lymphocytes Relative 20 12 - 46 %   Lymphs Abs 1.7 0.7 - 4.0 K/uL   Monocytes Relative 6 3 - 12 %   Monocytes Absolute 0.5 0.1 - 1.0 K/uL   Eosinophils Relative 2 0 - 5 %   Eosinophils Absolute 0.1 0.0 - 0.7 K/uL   Basophils Relative 0 0 - 1 %   Basophils  Absolute 0.0 0.0 - 0.1 K/uL  Comprehensive metabolic panel  Result Value Ref Range   Sodium 139 135 - 145 mmol/L   Potassium 4.6 3.5 - 5.1 mmol/L   Chloride 105 96 - 112 mEq/L   CO2 29 19 - 32 mmol/L   Glucose, Bld 211 (H) 70 - 99 mg/dL   BUN 20 6 - 23 mg/dL   Creatinine, Ser 1.11 0.50 - 1.35 mg/dL   Calcium 8.8 8.4 - 10.5 mg/dL   Total Protein 6.9 6.0 - 8.3 g/dL   Albumin 3.9 3.5 - 5.2 g/dL   AST 23 0 - 37 U/L   ALT 24 0 -  53 U/L   Alkaline Phosphatase 51 39 - 117 U/L   Total Bilirubin 0.8 0.3 - 1.2 mg/dL   GFR calc non Af Amer 73 (L) >90 mL/min   GFR calc Af Amer 85 (L) >90 mL/min   Anion gap 5 5 - 15  I-Stat Troponin, ED (not at [facility])  Result Value Ref Range   Troponin i, poc 0.00 0.00 - 0.08 ng/mL   Comment 3           Dg Chest 2 View  09/22/2014   CLINICAL DATA:  Chronic chest pain, shortness of breath and nausea for the past several weeks. Current history of coronary artery disease. Prior history of tuberculosis. Current smoker (45 pack-year history).  EXAM: CHEST  2 VIEW  COMPARISON:  10/14/2013 dating back to 08/21/2009. CT chest 11/04/2011.  FINDINGS: Cardiomediastinal silhouette unremarkable, unchanged. Emphysematous changes in the upper lobes, right greater than left. pleuroparenchymal scarring at the right apex. Prominent bronchovascular markings diffusely and mild to moderate central peribronchial thickening, unchanged. Lungs otherwise clear. No localized airspace consolidation. No pleural effusions. No pneumothorax. Normal pulmonary vascularity. Mild degenerative changes involving the thoracic spine. Prior lower cervical and upper thoracic spine fusion.  IMPRESSION: Stable COPD/emphysema.  No acute cardiopulmonary disease.   Electronically Signed   By: Evangeline Dakin M.D.   On: 09/22/2014 13:45    MDM  Heart score equals 3 based on risk factors, age, history Spoke with Dr. Hayes Ludwig plan 23 hour observation telemetry. Suggest cardiology consult Diagnosis #1 chest pain #2  tobacco abuse #3 hyperglycemia Final diagnoses:  Chest pain        Orlie Dakin, MD 09/22/14 1442

## 2014-09-22 NOTE — H&P (Signed)
Date: 09/22/2014               Patient Name:  Dustin [name] MRN: [id number]  DOB: [birth year] Age / Sex: 54 y.o., [name]   PCP: Elsie Lincoln, MD              Medical Service: Internal Medicine Teaching Service              Attending Physician: Dr. Sid Falcon, MD    First Contact: Sherilyn Banker, MS3 Pager: [phone]  Second Contact: Dr. Posey Pronto Pager: [phone]  Third Contact Dr. Denton Brick Pager: [phone]       After Hours (After 5p/  First Contact Pager: [phone]  weekends / holidays): Second Contact Pager: [phone]   Chief Complaint: chest pain at rest  History of Present Illness: Dustin [name] is a 54 yo [name] with a history of GERD, rheumatoid arthritis, hiatal hernia, anxiety, tobacco use, and a significant family history of CAD who presents with chest pain at rest. He has had four recent episodes of acute, stabbing anterior chest pain with shooting pain that last for about a minute each time. He had two episodes three nights ago and two more the following morning. He reports that the location of the chest pain varies (sometimes on left anterior chest, and sometimes is on right anterior chest) and is nonradiating. The pain does not change with position or respiration. 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 He feels a "head rush" when he has these events and he gets lightheaded. He says that nothing makes the symptoms worse or better, but he has taken a xanax each time it has happened. He has had a similar episode two months ago, and he got a workup at [facility] that showed a normal EKG and no elevated cardiac enzymes. He has been nauseous at baseline for the past two months and has also had  increased sweatiness and recent dyspnea on exertion. He denies dizziness, vision changes, syncope, diarrhea, fevers, vomiting.  He has had a panic attacks in the past and has baseline chest pain at rest due to his RA, but these events feel different. 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   He has significant family history for CAD: his father died of a MI at age 38, mother died of MI at age 19, brother has had 4 MIs and bypass surgery, two sisters have died from CHF.  He has been followed by cardiology last time at 2012 and had catheterization that showed an EF 55%, well preserved LV function and no evidence of high grade stenosis. He also had catheterizations in 2005 and 1999 that were reportedly normal based on prior notes although the reports could not be viewed.  Meds: Current Facility-Administered Medications  Medication Dose Route Frequency Provider Last Rate Last Dose  . nicotine (NICODERM CQ - dosed in mg/24 hours) patch 21 mg  21 mg Transdermal Daily Orlie Dakin, MD   21 mg at 09/22/14 1544    Allergies: Allergies as of 09/22/2014  . (No Known Allergies)   Past Medical History  Diagnosis Date  . Angina     panic attacks- cardiac tests 12  .  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 GERD (gastroesophageal reflux disease)   . H/O hiatal hernia   . Arthritis     rh  . Anxiety   . Depression   . Tuberculosis     hx tx  . Coronary artery disease    Past Surgical History  Procedure Laterality Date  . Cervical disc surgery  04  . Tonsillectomy  74  . Hernia repair  60's  . Anterior cervical decomp/discectomy fusion  01/21/2012    Procedure: ANTERIOR CERVICAL DECOMPRESSION/DISCECTOMY FUSION 2 LEVELS;  Surgeon: Erline Levine, MD;  Location: [place] [facility];  Service: Neurosurgery;  Laterality: N/A;  Cervical Six-Seven, Cervical Seven-Thoracic One, Anterior Cervical Decompression with Fusion Interbody Prothesis Plating and Bonegraft possible posterior Cervical Seven-Thoracic One Foraminotomy    History reviewed. No pertinent family history. History   Social History  . Marital Status: Married    Spouse Name: N/A    Number of Children: N/A  . Years of Education: N/A   Occupational History  . Not on file.   Social History Main Topics  . Smoking status: Former Smoker -- 1.50 packs/day for 30 years    Quit date: 09/15/2011  . Smokeless tobacco: Not on file  . Alcohol Use: No  . Drug Use: No  . Sexual Activity: Not on file   Other Topics Concern  . Not on file   Social History Narrative    Review of Systems: General: no changes in weight, no night sweats, fevers Eyes: no changes in vision Ears, nose, mouth, throat, and face: no congestion, changes in hearing Respiratory: no cough, wheezing. Cardiovascular: chest pain, chest pressure, dyspnea, irregular heart beat, but no syncope Gastrointestinal: nausea, but no change in bowel habits, no vomiting, no stomach pain Musculoskeletal: arthralgias, joint pain,  Neurological: negative for dizziness, gait problems, headaches and weakness  Physical Exam: Blood pressure 113/68, pulse 82, temperature 98.4 F (36.9 C), temperature source Oral, resp. rate 18, height 6\' 2"  (1.88 m), weight 140.025 kg (308 lb 11.2 oz), SpO2 95 %. General appearance: pleasant obese man, alert, cooperative and no distress Head: Normocephalic, without obvious abnormality, atraumatic Eyes: conjunctivae/corneas clear. PERRL, EOM's intact. Fundi benign. Nose: Nares normal. Septum midline. Mucosa normal. No drainage or sinus tenderness. Throat: lips, mucosa, and tongue normal; teeth and gums normal Neck: no adenopathy and supple, symmetrical, trachea midline Lungs: clear to auscultation bilaterally and normal percussion bilaterally Chest wall: no tenderness Heart: regular rate and rhythm, S1, S2 normal, no murmur, click, rub or gallop Abdomen: soft, non-tender; bowel sounds normal; no masses,  no organomegaly Extremities: extremities normal,  atraumatic, no cyanosis or edema Pulses: 2+ and symmetric Skin: Skin color, texture, turgor normal. No rashes or lesions Neurologic: Cranial nerves 2-12 intact, normal sensation bilaterally, equal strength bilaterally in upper and lower extremities  Lab results: Basic Metabolic Panel:  Recent Labs  09/22/14 1244  NA 139  K 4.6  CL 105  CO2 29  GLUCOSE 211*  BUN 20  CREATININE 1.11  CALCIUM 8.8   Liver Function Tests:  Recent Labs  09/22/14 1244  AST 23  ALT 24  ALKPHOS 51  BILITOT 0.8  PROT 6.9  ALBUMIN 3.9   No results for input(s): LIPASE, AMYLASE in the last 72 hours. No results for input(s): AMMONIA in the last 72 hours. CBC:  Recent Labs  09/22/14 1244  WBC 8.5  NEUTROABS 6.1  HGB 13.6  HCT 41.5  MCV 99.3  PLT  214   Troponin (Point of Care Test)  Recent Labs  09/22/14 1257  TROPIPOC 0.00    Imaging results:  09/22/2014: Dg Chest 2 View: Stable COPD/emphysema.  No acute cardiopulmonary disease.     Other results: EKG: normal EKG, normal sinus rhythm, sinus tachycardia, reduced voltage from previous tracings but otherwise unchanged.  Assessment & Plan by Problem: Dustin [name] is a 54 yo [name] with a history of GERD, rheumatoid arthritis, hiatal hernia, anxiety, tobacco use, and a significant family history of CAD who presents with chest pain.  Active Problems:   Chest pain at rest  Chest pain: First on the differential is unstable angina (he has a HEART score of 4 with a (moderately concerning history, age factor, and >3 risk factors) and a TIMI score of 2  (risk factors and aspirin use). Also on the differential is pulmonary (atypical presentation of PE is possible, but less likely given normal exam findings, normal chest x ray, Wells score of 0), GERD exacerbation  (possible, but less likely due to daily protonix use and description of pain), musculoskeletal (RA could play into chest pain), psyological (anxiety, panic attacks, given significant  stress he has been under recently). He has had at catheterization in 2012 that showed no stenosis and a EF of 55%. - monitor on telemetry - check troponin x3  - repeat EKG in morning -aspirin 81 mg PO daily - oxycodone for pain  Elevated glucose: Glucose of 211 (last glucose on 10/14/13 was 138). Given patient weight of 140 kg and his family history of diabetes, consider workup for diabetes. - lipid panel - HbA1C  Nausea: reports nausea for the past 2 months, likely related to chest pain. - odansetron IV 4mg  Q4  Anxiety: Patient is under considerable stress with work and sick wife and his heart symptoms are also contributing to anxiety. - alprazolam 1mg  PO TID  Rheumatoid Arthritis: followed by Dr. Estanislado Pandy at [facility].  RA has been stable. Taking methotrexate once a week for past 12 years. - folic acid 1 mg BID  GERD: Takes protonix daily, and has not had symptoms of heart burn or acid reflux in years. - protonix 40 mg daily  Tobacco abuse: Patient smokes 5 packs/week (down from 12 packs/week before). He quit for a short time in 2012. Patient has been counseled on the significant health consequences of smoking and is taking bupropion to help him quit smoking. 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 Needs exercise program and nutrition consult.  This is a Careers information officer Note.  The care of the patient was discussed with Dr. Posey Pronto and the assessment and plan was formulated with their assistance.  Please see their note for official documentation of the patient encounter.   Signed: Billy Fischer, Med Student 09/22/2014, 3:50 PM

## 2014-09-22 NOTE — ED Notes (Addendum)
PT has chest pain; SOB; nausea for few weeks. Has sign risk factors. Pt has 3 stents, smoker and obese.

## 2014-09-22 NOTE — H&P (Signed)
Date: 09/22/2014               Patient Name:  Dustin [name] MRN: [id number]  DOB: [birth year] Age / Sex: 54 y.o., male   PCP: Elsie Lincoln, MD         Medical Service: Internal Medicine Teaching Service         Attending Physician: Dr. Sid Falcon, MD    First Contact: Dr. Charlott Rakes Pager: [phone]  Second Contact: Dr. Bing Neighbors Pager: [phone]       After Hours (After 5p/  First Contact Pager: [phone]  weekends / holidays): Second Contact Pager: [phone]   Chief Complaint: chest pain  History of Present Illness: Dustin [name] is a 54 year old man with GERD, rheumatoid arthritis, hiatal hernia, anxiety, tobacco abuse who presents with chest pain.  On Monday night, he reports having a chest pain like a "knife stabbing through his heart." This pain occurred twice on Tuesday and was associated with a "head rush" which he describes as lightheadedness before occuring twice today. Location of the pain varies though neve radiates or extends anywhere else and is not associated with activity or breathing. He reports his first experience with this pain was 2 months ago while waiting at a stoplight and has tried taking home Xanax without relief; cardiac workup at [facility] was unremarkable at that time. Cardiac cath has been done three times in the past, most recent of which was 2012 without significant stenosis, EF 55%. Of note, he has had multiple family members die of MI: mother at age 18, father at age 42, brother at age 3; his living brother has undergone quadruple bypass. 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 Other than baseline nausea (which he associates with GERD), intermittent dyspnea not associated with exertion, and numbness/tingling associated with C7 nerve damage, he denies fever, abdominal pain, diarrhea, vomiting, vision changes, dizziness, loss of consciousness, leg swelling.      Meds: Current Facility-Administered Medications  Medication Dose Route Frequency Provider Last Rate Last Dose  . acetaminophen (TYLENOL) tablet 650 mg  650 mg Oral Q4H PRN Blain Pais, MD      . ALPRAZolam Duanne Moron) tablet 1 mg  1 mg Oral TID PRN Blain Pais, MD      . Derrill Memo ON 09/23/2014] aspirin EC tablet 81 mg  81 mg Oral Daily Blain Pais, MD      . buPROPion [facility] SR) 12 hr tablet 150 mg  150 mg Oral Daily Blain Pais, MD   150 mg at 09/22/14 1800  . folic acid (FOLVITE) tablet 1 mg  1 mg Oral BID Blain Pais, MD      . heparin injection 5,000 Units  5,000 Units Subcutaneous 3 times per day Blain Pais, MD      . nicotine (NICODERM CQ - dosed in mg/24 hours) patch 21 mg  21 mg Transdermal Daily Orlie Dakin, MD   21 mg at 09/22/14 1544  . ondansetron (ZOFRAN) injection 4 mg  4 mg Intravenous Q6H PRN Blain Pais, MD      . oxyCODONE-acetaminophen (PERCOCET/ROXICET) 5-325 MG per tablet 1 tablet  1 tablet Oral Q4H PRN Sid Falcon, MD       And  . oxyCODONE (Oxy IR/ROXICODONE) immediate release tablet 5 mg  5 mg Oral Q4H PRN Sid Falcon, MD      .  pantoprazole (PROTONIX) EC tablet 40 mg  40 mg Oral Daily Blain Pais, MD   40 mg at 09/22/14 1800  . zolpidem (AMBIEN) tablet 10 mg  10 mg Oral QHS PRN Blain Pais, MD        Allergies: Allergies as of 09/22/2014  . (No Known Allergies)   Past Medical History  Diagnosis Date  . Angina     panic attacks- cardiac tests 12  . 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 GERD (gastroesophageal reflux disease)   . H/O hiatal hernia   . Arthritis     rh  . Anxiety   . Depression   . Tuberculosis     hx tx  . Coronary artery disease     heart cath in 1999, 2005 and 2012 with patent coronary arteries   Past Surgical History  Procedure Laterality Date  . Cervical disc surgery  04  . Tonsillectomy  74  . Hernia repair  60's  . Anterior  cervical decomp/discectomy fusion  01/21/2012    Procedure: ANTERIOR CERVICAL DECOMPRESSION/DISCECTOMY FUSION 2 LEVELS;  Surgeon: Erline Levine, MD;  Location: [place] [facility];  Service: Neurosurgery;  Laterality: N/A;  Cervical Six-Seven, Cervical Seven-Thoracic One, Anterior Cervical Decompression with Fusion Interbody Prothesis Plating and Bonegraft possible posterior Cervical Seven-Thoracic One Foraminotomy  . Cardiac catheterization  [phone]    normal coronary arteries   Family History  Problem Relation Age of Onset  . Heart disease Mother   . COPD Mother   . Heart attack Father     sudden death  . Heart attack Sister 47  . Kidney disease Sister   . Heart disease Sister   . Heart attack Brother   . Heart disease Brother   . Diabetes Brother   . Heart attack Brother   . Heart disease Brother 46    CABG  . Heart attack Brother 57   History   Social History  . Marital Status: Married    Spouse Name: N/A    Number of Children: N/A  . Years of Education: N/A   Occupational History  . Not on file.   Social History Main Topics  . Smoking status: Current Every Day Smoker -- 1.50 packs/day for 30 years    Types: Cigarettes  . Smokeless tobacco: Never Used  . Alcohol Use: No  . Drug Use: No  . Sexual Activity: Not on file   Other Topics Concern  . Not on file   Social History Narrative    Review of Systems: Review of Systems  Constitutional: Negative for fever.  Eyes: Negative for blurred vision and double vision.  Respiratory: Positive for shortness of breath.   Cardiovascular: Positive for chest pain. Negative for leg swelling.  Gastrointestinal: Positive for nausea. Negative for vomiting, abdominal pain and diarrhea.  Neurological: Positive for tingling. Negative for dizziness and loss of consciousness.  Psychiatric/Behavioral: The patient is nervous/anxious.      Physical Exam: Blood pressure 113/68, pulse 82, temperature 98.4 F (36.9 C), temperature  source Oral, resp. rate 18, height 6\' 2"  (1.88 m), weight 308 lb 11.2 oz (140.025 kg), SpO2 95 %.  General: sitting up in bed, NAD HEENT: PERRL, EOMI, no scleral icterus, oropharynx clear Cardiac: RRR, no rubs, murmurs or gallops Pulm: clear to auscultation bilaterally, no wheezes, rales, or rhonchi Abd: soft, nontender, nondistended, BS present Ext: warm and well perfused, no pedal edema Neuro: CN II-XII intact, 5/5 upper and lower  extremity strength, 2+ grip strength    Lab results: Basic Metabolic Panel:  Recent Labs  09/22/14 1244  NA 139  K 4.6  CL 105  CO2 29  GLUCOSE 211*  BUN 20  CREATININE 1.11  CALCIUM 8.8   Liver Function Tests:  Recent Labs  09/22/14 1244  AST 23  ALT 24  ALKPHOS 51  BILITOT 0.8  PROT 6.9  ALBUMIN 3.9   CBC:  Recent Labs  09/22/14 1244  WBC 8.5  NEUTROABS 6.1  HGB 13.6  HCT 41.5  MCV 99.3  PLT 214   Cardiac Enzymes:  Recent Labs  09/22/14 1825  TROPONINI <0.03    Imaging results:  Dg Chest 2 View  09/22/2014   CLINICAL DATA:  Chronic chest pain, shortness of breath and nausea for the past several weeks. Current history of coronary artery disease. Prior history of tuberculosis. Current smoker (45 pack-year history).  EXAM: CHEST  2 VIEW  COMPARISON:  10/14/2013 dating back to 08/21/2009. CT chest 11/04/2011.  FINDINGS: Cardiomediastinal silhouette unremarkable, unchanged. Emphysematous changes in the upper lobes, right greater than left. pleuroparenchymal scarring at the right apex. Prominent bronchovascular markings diffusely and mild to moderate central peribronchial thickening, unchanged. Lungs otherwise clear. No localized airspace consolidation. No pleural effusions. No pneumothorax. Normal pulmonary vascularity. Mild degenerative changes involving the thoracic spine. Prior lower cervical and upper thoracic spine fusion.  IMPRESSION: Stable COPD/emphysema.  No acute cardiopulmonary disease.   Electronically Signed   By:  Evangeline Dakin M.D.   On: 09/22/2014 13:45    Other results: EKG: Reviewed and compared with 10/14/13 Sinus tachycardia: HR 103 Normal axis   Assessment & Plan by Problem:  Chest pain: ACS suspect given HEART score 4 [3+ risk factors, age, moderate history], TIMI score 2. CXR unremarkable for infectious process. Anxiety less likely as pain did not improve with Xanax. Do not suspect PE/DVT at this time given physical exam findings and Wells score 0. -Give ASA 81mg   -Monitor on telemetry -Check troponins x 3 -Repeat EKG in AM -Continue nitroglycerin sublingual prn for chest pain -Check A1c, lipid panel -Consult cardiology  Hyperglycemia: Glucose 211, up from 138 on 10/14/13.  -Workup as noted above  Tobacco abuse:  -Continue home Wellbutrin -Give Nicotine 21mg /patch  Anxiety: Continue home Xanax & Ambien.   Rheumatoid arthritis: Continue folate 1mg  BID. -Home home MTX  GERD: Continue home Protonix.  #FEN:  -Diet: NPO  #DVT prophylaxis: heparin 5000 units subcutaneous  #CODE STATUS: FULL CODE -Defer to wife Hilda Blades [phone] if patients lacks decision-making capacity -Confirmed with patient on admission    Dispo: Disposition is deferred at this time, awaiting improvement of current medical problems.   The patient does have a current PCP Elsie Lincoln, MD) and does not need an [facility] hospital follow-up appointment after discharge.  The patient does not have transportation limitations that hinder transportation to clinic appointments.  Signed: Charlott Rakes, MD 09/22/2014, 8:31 PM

## 2014-09-22 NOTE — ED Notes (Signed)
Patient transported to X-ray 

## 2014-09-23 ENCOUNTER — Observation Stay (HOSPITAL_COMMUNITY): Payer: BLUE CROSS/BLUE SHIELD

## 2014-09-23 DIAGNOSIS — R7302 Impaired glucose tolerance (oral): Secondary | ICD-10-CM | POA: Diagnosis not present

## 2014-09-23 DIAGNOSIS — R079 Chest pain, unspecified: Secondary | ICD-10-CM | POA: Diagnosis not present

## 2014-09-23 DIAGNOSIS — F419 Anxiety disorder, unspecified: Secondary | ICD-10-CM | POA: Diagnosis not present

## 2014-09-23 DIAGNOSIS — E781 Pure hyperglyceridemia: Secondary | ICD-10-CM | POA: Diagnosis not present

## 2014-09-23 DIAGNOSIS — K219 Gastro-esophageal reflux disease without esophagitis: Secondary | ICD-10-CM | POA: Diagnosis not present

## 2014-09-23 DIAGNOSIS — R110 Nausea: Secondary | ICD-10-CM

## 2014-09-23 DIAGNOSIS — E119 Type 2 diabetes mellitus without complications: Secondary | ICD-10-CM | POA: Diagnosis not present

## 2014-09-23 DIAGNOSIS — E669 Obesity, unspecified: Secondary | ICD-10-CM

## 2014-09-23 DIAGNOSIS — M069 Rheumatoid arthritis, unspecified: Secondary | ICD-10-CM | POA: Diagnosis not present

## 2014-09-23 LAB — LIPID PANEL
CHOL/HDL RATIO: 6 ratio
CHOLESTEROL: 180 mg/dL (ref 0–200)
HDL: 30 mg/dL — ABNORMAL LOW (ref >39)
LDL Cholesterol: 118 mg/dL — ABNORMAL HIGH (ref 0–99)
Triglycerides: 158 mg/dL — ABNORMAL HIGH (ref <150)
VLDL: 32 mg/dL (ref 0–40)

## 2014-09-23 LAB — HEMOGLOBIN A1C
Hgb A1c MFr Bld: 7.1 % — ABNORMAL HIGH (ref <5.7)
Mean Plasma Glucose: 157 mg/dL — ABNORMAL HIGH (ref <117)

## 2014-09-23 LAB — TROPONIN I

## 2014-09-23 MED ORDER — REGADENOSON 0.4 MG/5ML IV SOLN
INTRAVENOUS | Status: AC
  Administered 2014-09-23: 0.4 mg via INTRAVENOUS
  Filled 2014-09-23: qty 5

## 2014-09-23 MED ORDER — TECHNETIUM TC 99M SESTAMIBI GENERIC - CARDIOLITE
30.0000 | Freq: Once | INTRAVENOUS | Status: AC | PRN
  Administered 2014-09-23: 30 via INTRAVENOUS

## 2014-09-23 MED ORDER — REGADENOSON 0.4 MG/5ML IV SOLN
0.4000 mg | Freq: Once | INTRAVENOUS | Status: AC
  Administered 2014-09-23: 0.4 mg via INTRAVENOUS

## 2014-09-23 NOTE — Progress Notes (Signed)
UR completed 

## 2014-09-23 NOTE — Progress Notes (Signed)
    Subjective:  Denies CP or dyspnea   Objective:  Filed Vitals:   09/22/14 1400 09/22/14 1531 09/22/14 2136 09/23/14 0600  BP: 124/73 113/68 119/68 144/77  Pulse: 89 82 77 67  Temp:  98.4 F (36.9 C) 97.7 F (36.5 C) 97.8 F (36.6 C)  TempSrc:  Oral Oral Oral  Resp: 19 18 18 18   Height:  6\' 2"  (1.15 m)    Weight:  308 lb 11.2 oz (140.025 kg)  309 lb 11.2 oz (140.479 kg)  SpO2: 93% 95% 95% 97%    Intake/Output from previous day: No intake or output data in the 24 hours ending 09/23/14 0813  Physical Exam: Physical exam: Well-developed obese in no acute distress.  Skin is warm and dry.  HEENT is normal.  Neck is supple.  Chest is clear to auscultation with normal expansion.  Cardiovascular exam is regular rate and rhythm.  Abdominal exam nontender or distended. No masses palpated. Extremities show trace edema. neuro grossly intact    Lab Results: Basic Metabolic Panel:  Recent Labs  09/22/14 1244  NA 139  K 4.6  CL 105  CO2 29  GLUCOSE 211*  BUN 20  CREATININE 1.11  CALCIUM 8.8   CBC:  Recent Labs  09/22/14 1244  WBC 8.5  NEUTROABS 6.1  HGB 13.6  HCT 41.5  MCV 99.3  PLT 214   Cardiac Enzymes:  Recent Labs  09/22/14 1825 09/22/14 2330  TROPONINI <0.03 <0.03     Assessment/Plan:  1 chest pain-symptoms extremely atypical. Enzymes negative. D-dimer normal. Plan nuclear study today. If negative patient can be discharged and follow-up with Dr. Gwenlyn Found. 2 tobacco abuse-patient counseled on discontinuing. 3 hyperglycemia-further evaluation per primary care.   Kirk Ruths 09/23/2014, 8:13 AM

## 2014-09-23 NOTE — Progress Notes (Signed)
     The patient was seen in nuclear medicine for a leciscan myoview. He tolerated the procedure well. No acute ST or TW changes on ECG.    Perry Mount PA-C  [facility]

## 2014-09-23 NOTE — Progress Notes (Signed)
Subjective: D-dimer negative yesterday.     Objective: Vital signs in last 24 hours: Filed Vitals:   09/22/14 1531 09/22/14 2136 09/23/14 0600 09/23/14 1229  BP: 113/68 119/68 144/77 130/76  Pulse: 82 77 67   Temp: 98.4 F (36.9 C) 97.7 F (36.5 C) 97.8 F (36.6 C)   TempSrc: Oral Oral Oral   Resp: 18 18 18    Height: 6\' 2"  (1.88 m)     Weight: 308 lb 11.2 oz (140.025 kg)  309 lb 11.2 oz (140.479 kg)   SpO2: 95% 95% 97%    Weight change:   Intake/Output Summary (Last 24 hours) at 09/23/14 1244 Last data filed at 09/23/14 0856  Gross per 24 hour  Intake      0 ml  Output      0 ml  Net      0 ml   General: sitting up in bed, eating dinner, NAD HEENT: PERRL, EOMI, no scleral icterus, oropharynx clear Cardiac: RRR, no rubs, murmurs or gallops Pulm: clear to auscultation bilaterally, no wheezes, rales, or rhonchi Abd: soft, nontender, nondistended, BS present Ext: warm and well perfused, no pedal edema Neuro: responds to questions appropriately; moving all extremities freely   Lab Results: Basic Metabolic Panel:  Recent Labs Lab 09/22/14 1244  NA 139  K 4.6  CL 105  CO2 29  GLUCOSE 211*  BUN 20  CREATININE 1.11  CALCIUM 8.8   Liver Function Tests:  Recent Labs Lab 09/22/14 1244  AST 23  ALT 24  ALKPHOS 51  BILITOT 0.8  PROT 6.9  ALBUMIN 3.9   CBC:  Recent Labs Lab 09/22/14 1244  WBC 8.5  NEUTROABS 6.1  HGB 13.6  HCT 41.5  MCV 99.3  PLT 214   Cardiac Enzymes:  Recent Labs Lab 09/22/14 1825 09/22/14 2330  TROPONINI <0.03 <0.03   D-Dimer:  Recent Labs Lab 09/22/14 2330  DDIMER 0.41   Fasting Lipid Panel:  Recent Labs Lab 09/22/14 2330  CHOL 180  HDL 30*  LDLCALC 118*  TRIG 158*  CHOLHDL 6.0    Studies/Results: Dg Chest 2 View  09/22/2014   CLINICAL DATA:  Chronic chest pain, shortness of breath and nausea for the past several weeks. Current history of coronary artery disease. Prior history of tuberculosis. Current  smoker (45 pack-year history).  EXAM: CHEST  2 VIEW  COMPARISON:  10/14/2013 dating back to 08/21/2009. CT chest 11/04/2011.  FINDINGS: Cardiomediastinal silhouette unremarkable, unchanged. Emphysematous changes in the upper lobes, right greater than left. pleuroparenchymal scarring at the right apex. Prominent bronchovascular markings diffusely and mild to moderate central peribronchial thickening, unchanged. Lungs otherwise clear. No localized airspace consolidation. No pleural effusions. No pneumothorax. Normal pulmonary vascularity. Mild degenerative changes involving the thoracic spine. Prior lower cervical and upper thoracic spine fusion.  IMPRESSION: Stable COPD/emphysema.  No acute cardiopulmonary disease.   Electronically Signed   By: Evangeline Dakin M.D.   On: 09/22/2014 13:45   Medications: I have reviewed the patient's current medications. Scheduled Meds: . aspirin EC  81 mg Oral Daily  . buPROPion  150 mg Oral Daily  . folic acid  1 mg Oral BID  . heparin  5,000 Units Subcutaneous 3 times per day  . nicotine  21 mg Transdermal Daily  . pantoprazole  40 mg Oral Daily   Continuous Infusions:  PRN Meds:.acetaminophen, ALPRAZolam, ondansetron (ZOFRAN) IV, oxyCODONE-acetaminophen **AND** oxyCODONE, zolpidem Assessment/Plan:  Chest pain: ACS suspect given HEART score 4 [3+ risk factors, age, moderate history],  TIMI score 2 though pain atypical on interview yesterday. Lipid panel notable for HDL 30, LDL 118, TG 158. Troponins unremarkable x 3, telemetry unremarkable overnight, and EKG findings were reassuring to r/o ACS. -Follow-up nuclear stress test today -Continue  ASA 81mg   -Monitor on telemetry -Continue nitroglycerin sublingual prn for chest pain -Follow-up A1c -Cardiology following, appreciate recs  Hyperglycemia: Glucose 211, up from 138 on 10/14/13.  -Workup as noted above  Tobacco abuse:  -Continue home Wellbutrin though may need to consider change in therapy given it  may induce anxiety -Give Nicotine 21mg /patch  Anxiety: Continue home Xanax & Ambien.   Rheumatoid arthritis: Continue folate 1mg  BID. -Home home MTX  GERD: Continue home Protonix.  #FEN:  -Diet: NPO  #DVT prophylaxis: heparin 5000 units subcutaneous  #CODE STATUS: FULL CODE -Defer to wife Hilda Blades [phone] if patients lacks decision-making capacity -Confirmed with patient on admission   Dispo: Disposition is deferred at this time, awaiting improvement of current medical problems.    The patient does have a current PCP Elsie Lincoln, MD) and does not need an [facility] hospital follow-up appointment after discharge.  The patient does not have transportation limitations that hinder transportation to clinic appointments.  .Services Needed at time of discharge: Y = Yes, Blank = No PT:   OT:   RN:   Equipment:   Other:     LOS: 1 day   Charlott Rakes, MD 09/23/2014, 12:44 PM

## 2014-09-23 NOTE — Progress Notes (Signed)
Subjective: Patient reports no more episodes of chest pain. Denies dizziness, nausea, sweatiness, chest palpitations. Reports no problems with the cardiac stress test.  Objective: Vital signs in last 24 hours: Filed Vitals:   09/23/14 1229 09/23/14 1249 09/23/14 1251 09/23/14 1252  BP: 130/76 123/100 138/90 135/75  Pulse:      Temp:      TempSrc:      Resp:      Height:      Weight:      SpO2:       Weight change:   Intake/Output Summary (Last 24 hours) at 09/23/14 1311 Last data filed at 09/23/14 0856  Gross per 24 hour  Intake      0 ml  Output      0 ml  Net      0 ml    General appearance: obese man, cooperative and no distress Head: Normocephalic, without obvious abnormality, atraumatic Neck: no adenopathy, no carotid bruit, no JVD and supple, symmetrical, trachea midline Back: symmetric, no curvature. ROM normal. No CVA tenderness. Lungs: clear to auscultation bilaterally Chest wall: no tenderness Heart: regular rate and rhythm, S1, S2 normal, no murmur, click, rub or gallop Abdomen: soft, non-tender; bowel sounds normal; no masses,  no organomegaly Neurologic: Grossly normal  Lab Results: Basic Metabolic Panel:  Recent Labs  09/22/14 1244  NA 139  K 4.6  CL 105  CO2 29  GLUCOSE 211*  BUN 20  CREATININE 1.11  CALCIUM 8.8   Liver Function Tests:  Recent Labs  09/22/14 1244  AST 23  ALT 24  ALKPHOS 51  BILITOT 0.8  PROT 6.9  ALBUMIN 3.9   CBC:  Recent Labs  09/22/14 1244  WBC 8.5  NEUTROABS 6.1  HGB 13.6  HCT 41.5  MCV 99.3  PLT 214   Cardiac Enzymes:  Recent Labs  09/22/14 1825 09/22/14 2330  TROPONINI <0.03 <0.03   D-Dimer:  Recent Labs  09/22/14 2330  DDIMER 0.41   Fasting Lipid Panel:  Recent Labs  09/22/14 2330  CHOL 180  HDL 30*  LDLCALC 118*  TRIG 158*  CHOLHDL 6.0   Studies/Results: 09/22/2014: Dg Chest 2 View: Stable COPD/emphysema. No acute cardiopulmonary disease.  Medications: I have reviewed the  patient's current medications. Scheduled Meds: . aspirin EC  81 mg Oral Daily  . buPROPion  150 mg Oral Daily  . folic acid  1 mg Oral BID  . heparin  5,000 Units Subcutaneous 3 times per day  . nicotine  21 mg Transdermal Daily  . pantoprazole  40 mg Oral Daily   Continuous Infusions:   PRN Meds:.acetaminophen, ALPRAZolam, ondansetron (ZOFRAN) IV, oxyCODONE-acetaminophen **AND** oxyCODONE, zolpidem   Assessment/Plan: Dustin [name] is a 54 yo male with a history of GERD, rheumatoid arthritis, hiatal hernia, anxiety, tobacco use, and a significant family history of CAD who presents with chest pain.  Atypical chest pain: Repeat EKG in the morning showed no changes. Cardiac enzymes negative. Troponins neg x3. D-dimer normal.  - monitor on telemetry - aspirin 81 mg PO daily - oxycodone for pain - per cardiology, stress test to be completed tomorrow - would like to follow up with cardiologist in [place]  Elevated glucose: Glucose of 211 (last glucose on 10/14/13 was 138, 2 yrs ago 243).  - lipid panel showed elevated trigylcerides (158), LDL (118), and low HDL (30). Cholesterol was 180. Consider starting a statin.  - repeat fasting lipid panel in morning - HbA1C pending  Nausea: reports nausea for  the past 2 months, likely related to chest pain. - odansetron IV 4mg  Q4  Anxiety: Patient is under considerable stress with work and sick wife and his heart symptoms are also contributing to anxiety. - alprazolam 1mg  PO TID  Rheumatoid Arthritis: followed by Dr. Estanislado Pandy at [facility].  RA has been stable. Taking methotrexate once a week for past 12 years. - folic acid 1 mg BID  GERD: Takes protonix daily, and has not had symptoms of heart burn or acid reflux in years. - protonix 40 mg daily  Tobacco abuse: Patient smokes 5 packs/week (down from 12 packs/week before). He quit for a short time in 2012. Patient has been counseled on the significant health consequences of smoking  and is taking bupropion to help him quit smoking. 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 Needs exercise program and nutrition consult.  Dispo: - f/u appt scheduled with PCP for Thursday 1/14 at 1:30  This is a Careers information officer Note.  The care of the patient was discussed with Dr. Posey Pronto and the assessment and plan formulated with their assistance.  Please see their attached note for official documentation of the daily encounter.   LOS: 1 day   Billy Fischer, Med Student 09/23/2014, 1:11 PM

## 2014-09-24 ENCOUNTER — Observation Stay (HOSPITAL_COMMUNITY): Payer: BLUE CROSS/BLUE SHIELD

## 2014-09-24 DIAGNOSIS — R079 Chest pain, unspecified: Secondary | ICD-10-CM | POA: Diagnosis not present

## 2014-09-24 DIAGNOSIS — E119 Type 2 diabetes mellitus without complications: Secondary | ICD-10-CM | POA: Diagnosis not present

## 2014-09-24 DIAGNOSIS — E781 Pure hyperglyceridemia: Secondary | ICD-10-CM | POA: Diagnosis not present

## 2014-09-24 DIAGNOSIS — F419 Anxiety disorder, unspecified: Secondary | ICD-10-CM | POA: Diagnosis not present

## 2014-09-24 MED ORDER — TECHNETIUM TC 99M SESTAMIBI GENERIC - CARDIOLITE
30.0000 | Freq: Once | INTRAVENOUS | Status: AC | PRN
  Administered 2014-09-24: 30 via INTRAVENOUS

## 2014-09-24 MED ORDER — ATORVASTATIN CALCIUM 10 MG PO TABS: 10.0000 mg | ORAL_TABLET | Freq: Every day | ORAL | Status: DC

## 2014-09-24 MED ORDER — NICOTINE 21 MG/24HR TD PT24: 21.0000 mg | MEDICATED_PATCH | Freq: Every day | TRANSDERMAL | Status: DC

## 2014-09-24 NOTE — Progress Notes (Signed)
Subjective: Pt doing well today. Ready to go home. Stress test came back negative.  Objective: Vital signs in last 24 hours: Filed Vitals:   09/23/14 1251 09/23/14 1252 09/23/14 1631 09/23/14 2216  BP: 138/90 135/75 108/58 128/67  Pulse:   75 76  Temp:   97.5 F (36.4 C) 98.6 F (37 C)  TempSrc:   Oral Oral  Resp:   18   Height:      Weight:      SpO2:   94% 97%   Weight change:   Intake/Output Summary (Last 24 hours) at 09/24/14 1208 Last data filed at 09/24/14 0842  Gross per 24 hour  Intake   1320 ml  Output      0 ml  Net   1320 ml   General: sitting up in bed, eating dinner, NAD HEENT: PERRL, EOMI, no scleral icterus, oropharynx clear Cardiac: RRR, no rubs, murmurs or gallops Pulm: clear to auscultation bilaterally, no wheezes, rales, or rhonchi Abd: soft, nontender, nondistended, BS present Ext: warm and well perfused, no pedal edema Neuro: responds to questions appropriately; moving all extremities freely  Lab Results: Basic Metabolic Panel:  Recent Labs Lab 09/22/14 1244  NA 139  K 4.6  CL 105  CO2 29  GLUCOSE 211*  BUN 20  CREATININE 1.11  CALCIUM 8.8   Liver Function Tests:  Recent Labs Lab 09/22/14 1244  AST 23  ALT 24  ALKPHOS 51  BILITOT 0.8  PROT 6.9  ALBUMIN 3.9   CBC:  Recent Labs Lab 09/22/14 1244  WBC 8.5  NEUTROABS 6.1  HGB 13.6  HCT 41.5  MCV 99.3  PLT 214   Cardiac Enzymes:  Recent Labs Lab 09/22/14 1825 09/22/14 2330  TROPONINI <0.03 <0.03   D-Dimer:  Recent Labs Lab 09/22/14 2330  DDIMER 0.41   Fasting Lipid Panel:  Recent Labs Lab 09/22/14 2330  CHOL 180  HDL 30*  LDLCALC 118*  TRIG 158*  CHOLHDL 6.0    Studies/Results: Dg Chest 2 View  09/22/2014   CLINICAL DATA:  Chronic chest pain, shortness of breath and nausea for the past several weeks. Current history of coronary artery disease. Prior history of tuberculosis. Current smoker (45 pack-year history).  EXAM: CHEST  2 VIEW  COMPARISON:   10/14/2013 dating back to 08/21/2009. CT chest 11/04/2011.  FINDINGS: Cardiomediastinal silhouette unremarkable, unchanged. Emphysematous changes in the upper lobes, right greater than left. pleuroparenchymal scarring at the right apex. Prominent bronchovascular markings diffusely and mild to moderate central peribronchial thickening, unchanged. Lungs otherwise clear. No localized airspace consolidation. No pleural effusions. No pneumothorax. Normal pulmonary vascularity. Mild degenerative changes involving the thoracic spine. Prior lower cervical and upper thoracic spine fusion.  IMPRESSION: Stable COPD/emphysema.  No acute cardiopulmonary disease.   Electronically Signed   By: Evangeline Dakin M.D.   On: 09/22/2014 13:45   Nm Myocar Multi W/spect W/wall Motion / Ef  09/24/2014   CLINICAL DATA:  Chest pain. History of morbid obesity and smoking. Negative cardiac catheterizations (1999, 2005 and 2012).  EXAM: MYOCARDIAL IMAGING WITH SPECT (REST AND PHARMACOLOGIC-STRESS - 2 DAY PROTOCOL)  GATED LEFT VENTRICULAR WALL MOTION STUDY  LEFT VENTRICULAR EJECTION FRACTION  TECHNIQUE: Standard myocardial SPECT imaging was performed after resting intravenous injection of 30 mCi Tc-66m sestamibi. Subsequently, on a second day, intravenous infusion of Lexiscan was performed under the supervision of the Cardiology staff. At peak effect of the drug, 30 mCi Tc-10m sestamibi was injected intravenously and standard myocardial SPECT imaging was  performed. Quantitative gated imaging was also performed to evaluate left ventricular wall motion, and estimate left ventricular ejection fraction.  COMPARISON:  Chest radiograph- 09/22/2014; chest CT -10/02/2010  FINDINGS: Raw images: There is mild patient motion artifact on both the provided rest and stress images. There is no significant chest wall attenuation.  Perfusion: There is a minimal amount of attenuation which involves the inferior and lateral walls of the left ventricle, both of  which improves on the provided stress images and is without associated geographic wall motion and no definitive scintigraphic evidence of prior infarction or pharmacologically induced ischemia.  Wall Motion: Normal left ventricular wall motion. No left ventricular dilation.  Left Ventricular Ejection Fraction: 61 %  End diastolic volume 102 ml  End systolic volume 46 ml  IMPRESSION: 1. No scintigraphic evidence of prior infarction or pharmacologically induced ischemia.  2. Normal left ventricular wall motion.  3. Left ventricular ejection fraction 61%  4. Low-risk stress test findings*.  *2012 Appropriate Use Criteria for Coronary Revascularization Focused Update: J Am Coll Cardiol. 7253;66(4):403-474. http://content.[email or web address].aspx?articleid=1201161   Electronically Signed   By: Sandi Mariscal M.D.   On: 09/24/2014 09:07   Medications: I have reviewed the patient's current medications. Scheduled Meds: . aspirin EC  81 mg Oral Daily  . buPROPion  150 mg Oral Daily  . folic acid  1 mg Oral BID  . heparin  5,000 Units Subcutaneous 3 times per day  . nicotine  21 mg Transdermal Daily  . pantoprazole  40 mg Oral Daily   Continuous Infusions:  PRN Meds:.acetaminophen, ALPRAZolam, ondansetron (ZOFRAN) IV, oxyCODONE-acetaminophen **AND** oxyCODONE, zolpidem Assessment/Plan:  Chest pain: TIMI score 2 though pain atypical on interview yesterday. Lipid panel notable for HDL 30, LDL 118, TG 158. Pt had 2 day stress test, impression- low risk.  -Continue  ASA 81mg    - HgbA1c- 7.1 -Cardiology following, appreciate recs  Diabetes-: HgBA1c- 7.1.  - Pt says this is not new for him, HgBA1c has been as high as 8 in the past. He has been able to control it with diet and exercise. He say she does not need medications, he will work on it. - follow up with PCP.   Tobacco abuse:  -Continue home Wellbutrin, follow up with PCP.  -Give Nicotine 21mg /patch  Anxiety: Continue home Xanax & Ambien.    Rheumatoid arthritis: Continue folate 1mg  BID. -Home home MTX  GERD: Continue home Protonix.  #DVT prophylaxis: heparin 5000 units subcutaneous  #CODE STATUS: FULL CODE -Defer to wife Hilda Blades [phone] if patients lacks decision-making capacity -Confirmed with patient on admission  Dispo: Disposition is deferred at this time, awaiting improvement of current medical problems.    The patient does have a current PCP Elsie Lincoln, MD) and does not need an [facility] hospital follow-up appointment after discharge.  The patient does not have transportation limitations that hinder transportation to clinic appointments.  .Services Needed at time of discharge: Y = Yes, Blank = No PT:   OT:   RN:   Equipment:   Other:     LOS: 2 days   Bethena Roys, MD 09/24/2014, 12:08 PM

## 2014-09-24 NOTE — Discharge Instructions (Signed)
Continue to take the medications that you came in with.  We have added Lipitor (Atorvastatin) to your medication list for your high bad cholesterol (LDL). Please take this medication once a day. You have also been prescribed a nicotine patch to aid your quitting smoking. Good luck! We believe that you can do it! Return to the hospital if you have any symptoms similar to the concerning ones that you came in with.

## 2014-09-24 NOTE — Progress Notes (Signed)
Subjective: No acute events overnight. Denies chest pain, nausea, dizziness, SOB. Motivated to quit smoking, start a healthy diet.  Objective: Vital signs in last 24 hours: Filed Vitals:   09/23/14 1251 09/23/14 1252 09/23/14 1631 09/23/14 2216  BP: 138/90 135/75 108/58 128/67  Pulse:   75 76  Temp:   97.5 F (36.4 C) 98.6 F (37 C)  TempSrc:   Oral Oral  Resp:   18   Height:      Weight:      SpO2:   94% 97%   Weight change:   Intake/Output Summary (Last 24 hours) at 09/24/14 0941 Last data filed at 09/24/14 0842  Gross per 24 hour  Intake   1320 ml  Output      0 ml  Net   1320 ml   General appearance: pleasant obese man, alert, cooperative and no distress Head: Normocephalic, without obvious abnormality, atraumatic Neck: no adenopathy, no carotid bruit, no JVD and supple, symmetrical, trachea midline Lungs: clear to auscultation bilaterally and normal percussion bilaterally Chest wall: no tenderness Heart: regular rate and rhythm, S1, S2 normal and no S3 or S4 Abdomen: soft, non-tender; bowel sounds normal; no masses,  no organomegaly Extremities: extremities normal, atraumatic, no cyanosis or edema Pulses: 2+ and symmetric  Lab Results: Basic Metabolic Panel:  Recent Labs  09/22/14 1244  NA 139  K 4.6  CL 105  CO2 29  GLUCOSE 211*  BUN 20  CREATININE 1.11  CALCIUM 8.8   Liver Function Tests:  Recent Labs  09/22/14 1244  AST 23  ALT 24  ALKPHOS 51  BILITOT 0.8  PROT 6.9  ALBUMIN 3.9   No results for input(s): LIPASE, AMYLASE in the last 72 hours. No results for input(s): AMMONIA in the last 72 hours. CBC:  Recent Labs  09/22/14 1244  WBC 8.5  NEUTROABS 6.1  HGB 13.6  HCT 41.5  MCV 99.3  PLT 214   Cardiac Enzymes:  Recent Labs  09/22/14 1825 09/22/14 2330  TROPONINI <0.03 <0.03   BNP: No results for input(s): PROBNP in the last 72 hours. D-Dimer:  Recent Labs  09/22/14 2330  DDIMER 0.41   Hemoglobin A1C:  Recent  Labs  09/22/14 2330  HGBA1C 7.1*   Fasting Lipid Panel:  Recent Labs  09/22/14 2330  CHOL 180  HDL 30*  LDLCALC 118*  TRIG 158*  CHOLHDL 6.0   Studies/Results: Dg Chest 2 View  09/22/2014   CLINICAL DATA:  Chronic chest pain, shortness of breath and nausea for the past several weeks. Current history of coronary artery disease. Prior history of tuberculosis. Current smoker (45 pack-year history).  EXAM: CHEST  2 VIEW  COMPARISON:  10/14/2013 dating back to 08/21/2009. CT chest 11/04/2011.  FINDINGS: Cardiomediastinal silhouette unremarkable, unchanged. Emphysematous changes in the upper lobes, right greater than left. pleuroparenchymal scarring at the right apex. Prominent bronchovascular markings diffusely and mild to moderate central peribronchial thickening, unchanged. Lungs otherwise clear. No localized airspace consolidation. No pleural effusions. No pneumothorax. Normal pulmonary vascularity. Mild degenerative changes involving the thoracic spine. Prior lower cervical and upper thoracic spine fusion.  IMPRESSION: Stable COPD/emphysema.  No acute cardiopulmonary disease.   Electronically Signed   By: Evangeline Dakin M.D.   On: 09/22/2014 13:45   Nm Myocar Multi W/spect W/wall Motion / Ef  09/24/2014   CLINICAL DATA:  Chest pain. History of morbid obesity and smoking. Negative cardiac catheterizations (1999, 2005 and 2012).  EXAM: MYOCARDIAL IMAGING WITH SPECT (REST AND PHARMACOLOGIC-STRESS -  2 DAY PROTOCOL)  GATED LEFT VENTRICULAR WALL MOTION STUDY  LEFT VENTRICULAR EJECTION FRACTION  TECHNIQUE: Standard myocardial SPECT imaging was performed after resting intravenous injection of 30 mCi Tc-10m sestamibi. Subsequently, on a second day, intravenous infusion of Lexiscan was performed under the supervision of the Cardiology staff. At peak effect of the drug, 30 mCi Tc-65m sestamibi was injected intravenously and standard myocardial SPECT imaging was performed. Quantitative gated imaging was also  performed to evaluate left ventricular wall motion, and estimate left ventricular ejection fraction.  COMPARISON:  Chest radiograph- 09/22/2014; chest CT -10/02/2010  FINDINGS: Raw images: There is mild patient motion artifact on both the provided rest and stress images. There is no significant chest wall attenuation.  Perfusion: There is a minimal amount of attenuation which involves the inferior and lateral walls of the left ventricle, both of which improves on the provided stress images and is without associated geographic wall motion and no definitive scintigraphic evidence of prior infarction or pharmacologically induced ischemia.  Wall Motion: Normal left ventricular wall motion. No left ventricular dilation.  Left Ventricular Ejection Fraction: 61 %  End diastolic volume 332 ml  End systolic volume 46 ml  IMPRESSION: 1. No scintigraphic evidence of prior infarction or pharmacologically induced ischemia.  2. Normal left ventricular wall motion.  3. Left ventricular ejection fraction 61%  4. Low-risk stress test findings*.  *2012 Appropriate Use Criteria for Coronary Revascularization Focused Update: J Am Coll Cardiol. 9518;84(1):660-630. http://content.[email or web address].aspx?articleid=1201161   Electronically Signed   By: Sandi Mariscal M.D.   On: 09/24/2014 09:07   Medications: I have reviewed the patient's current medications. Scheduled Meds: . aspirin EC  81 mg Oral Daily  . buPROPion  150 mg Oral Daily  . folic acid  1 mg Oral BID  . heparin  5,000 Units Subcutaneous 3 times per day  . nicotine  21 mg Transdermal Daily  . pantoprazole  40 mg Oral Daily   Continuous Infusions:  PRN Meds:.acetaminophen, ALPRAZolam, ondansetron (ZOFRAN) IV, oxyCODONE-acetaminophen **AND** oxyCODONE, zolpidem   Assessment/Plan: Dustin [name] is a 54 yo male with a history of GERD, rheumatoid arthritis, hiatal hernia, anxiety, tobacco use, and a significant family history of CAD who presents with chest  pain.  Atypical chest pain: Repeat EKG in the morning showed no changes. Cardiac enzymes negative. Troponins neg x3. D-dimer normal. Stress test was negative - continue home aspirin 81 mg PO daily - would like to follow up with cardiologist in [place]  Diabetes: HbA1c of 7.1. Glucose of 211 (last glucose on 10/14/13 was 138, 2 yrs ago 243). - Manage with exercise and diet    Hypertriglyceridemia: Elevated Lipid panel showed elevated trigylcerides (158), LDL (118), and low HDL (30). Cholesterol was 180.  - start atorvastatin 10 mg qd  Anxiety: Patient is under considerable stress with work and sick wife and his heart symptoms are also contributing to anxiety. - alprazolam 1mg  PO TID  Rheumatoid Arthritis: followed by Dr. Estanislado Pandy at [facility]. RA has been stable. Taking methotrexate once a week for past 12 years. - folic acid 1 mg BID  GERD: Takes protonix daily, and has not had symptoms of heart burn or acid reflux in years. - protonix 40 mg daily  Tobacco abuse: Patient smokes 5 packs/week (down from 12 packs/week before). He quit for a short time in 2012. Patient has been counseled on the significant health consequences of smoking and is taking bupropion to help him quit smoking. He is motivated to quit. 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  - patient has been educated on diet and exercise  Dispo: - f/u appt scheduled with PCP for Thursday 1/14 at 1:30  This is a Careers information officer Note.  The care of the patient was discussed with Dr. Denton Brick and the assessment and plan formulated with their assistance.  Please see their attached note for official documentation of the daily encounter.   LOS: 2 days   Billy Fischer, Med Student 09/24/2014, 9:41 AM

## 2014-09-24 NOTE — Progress Notes (Signed)
UR completed 

## 2014-09-26 NOTE — Discharge Summary (Signed)
Name: Dustin [name] MRN: [id number] DOB: 1961-02-16 54 y.o. PCP: Dustin Lincoln, MD  Date of Admission: 09/22/2014 12:51 PM Date of Discharge: 09/24/2013 Attending Physician: Dustin Chiquito, MD  Discharge Diagnosis: Atypical chest pain DM2 Hypertriglyceridemia Anxiety Rheumatoid arthritis Tobacco abuse Obesity  Discharge Medications:   Medication List    TAKE these medications        ALPRAZolam 1 MG tablet  Commonly known as:  XANAX  Take 1 mg by mouth 3 (three) times daily as needed for anxiety.     aspirin 81 MG tablet  Take 81 mg by mouth daily.     atorvastatin 10 MG tablet  Commonly known as:  LIPITOR  Take 1 tablet (10 mg total) by mouth daily.     buPROPion 150 MG 12 hr tablet  Commonly known as:  WELLBUTRIN SR  Take 150 mg by mouth daily.     folic acid 1 MG tablet  Commonly known as:  FOLVITE  Take 1 mg by mouth 2 (two) times daily.     methotrexate 2.5 MG tablet  Commonly known as:  RHEUMATREX  Take 25 mg by mouth once a week.     nicotine 21 mg/24hr patch  Commonly known as:  NICODERM CQ - dosed in mg/24 hours  Place 1 patch (21 mg total) onto the skin daily.     oxyCODONE-acetaminophen 10-325 MG per tablet  Commonly known as:  PERCOCET  Take 1 tablet by mouth every 4 (four) hours as needed for pain.     pantoprazole 40 MG tablet  Commonly known as:  PROTONIX  Take 40 mg by mouth daily.     zolpidem 10 MG tablet  Commonly known as:  AMBIEN  Take 10 mg by mouth at bedtime as needed for sleep.        Disposition and follow-up:   DustinDustin [name] was discharged from [facility] in Stable condition.  At the hospital follow up visit please address:  1.  Anxiety/stress  2.  DM2, hypertriglyceridemia: lifestyle modifications, initiating metformin  3.  Smoking cessation: tolerance of nicotine patches  4.  Labs / imaging needed at time of follow-up: none  5.  Pending labs/ test needing follow-up: none  Follow-up  Appointments: Follow-up Information    Follow up with Dustin Grills, MD On 09/29/2014.   Specialty:  Family Medicine   Why:  hospital follow up at 1:30 with Dustin Cheadle, PA in office   Contact information:   [place] [id number] [phone]       Follow up with [facility].   Specialty:  Cardiology   Why:  The office will call you to make an appoinment., If you do not hear from them, please contact them., You should be seen within 1-2 weeks.   Contact information:   [place] [phone]      Discharge Instructions: Discharge Instructions    Diet - low sodium heart healthy    Complete by:  As directed      Increase activity slowly    Complete by:  As directed            Consultations: Treatment Team:  Rounding Lbcardiology, MD  Procedures Performed:  Dg Chest 2 View  09/22/2014   CLINICAL DATA:  Chronic chest pain, shortness of breath and nausea for the past several weeks. Current history of coronary artery disease. Prior history of tuberculosis. Current smoker (45 pack-year history).  EXAM: CHEST  2 VIEW  COMPARISON:  10/14/2013 dating back to 08/21/2009. CT chest 11/04/2011.  FINDINGS: Cardiomediastinal silhouette unremarkable, unchanged. Emphysematous changes in the upper lobes, right greater than left. pleuroparenchymal scarring at the right apex. Prominent bronchovascular markings diffusely and mild to moderate central peribronchial thickening, unchanged. Lungs otherwise clear. No localized airspace consolidation. No pleural effusions. No pneumothorax. Normal pulmonary vascularity. Mild degenerative changes involving the thoracic spine. Prior lower cervical and upper thoracic spine fusion.  IMPRESSION: Stable COPD/emphysema.  No acute cardiopulmonary disease.   Electronically Signed   By: Dustin [name] M.D.   On: 09/22/2014 13:45   Nm Myocar Multi W/spect W/wall Motion / Ef  09/24/2014    CLINICAL DATA:  Chest pain. History of morbid obesity and smoking. Negative cardiac catheterizations (1999, 2005 and 2012).  EXAM: MYOCARDIAL IMAGING WITH SPECT (REST AND PHARMACOLOGIC-STRESS - 2 DAY PROTOCOL)  GATED LEFT VENTRICULAR WALL MOTION STUDY  LEFT VENTRICULAR EJECTION FRACTION  TECHNIQUE: Standard myocardial SPECT imaging was performed after resting intravenous injection of 30 mCi Tc-61m sestamibi. Subsequently, on a second day, intravenous infusion of Lexiscan was performed under the supervision of the Cardiology staff. At peak effect of the drug, 30 mCi Tc-6m sestamibi was injected intravenously and standard myocardial SPECT imaging was performed. Quantitative gated imaging was also performed to evaluate left ventricular wall motion, and estimate left ventricular ejection fraction.  COMPARISON:  Chest radiograph- 09/22/2014; chest CT -10/02/2010  FINDINGS: Raw images: There is mild patient motion artifact on both the provided rest and stress images. There is no significant chest wall attenuation.  Perfusion: There is a minimal amount of attenuation which involves the inferior and lateral walls of the left ventricle, both of which improves on the provided stress images and is without associated geographic wall motion and no definitive scintigraphic evidence of prior infarction or pharmacologically induced ischemia.  Wall Motion: Normal left ventricular wall motion. No left ventricular dilation.  Left Ventricular Ejection Fraction: 61 %  End diastolic volume 852 ml  End systolic volume 46 ml  IMPRESSION: 1. No scintigraphic evidence of prior infarction or pharmacologically induced ischemia.  2. Normal left ventricular wall motion.  3. Left ventricular ejection fraction 61%  4. Low-risk stress test findings*.  *2012 Appropriate Use Criteria for Coronary Revascularization Focused Update: J Am Coll Cardiol. 7782;42(3):536-144. http://content.airportbarriers.com.aspx?articleid=1201161   Electronically  Signed   By: Dustin [name] M.D.   On: 09/24/2014 09:07    Admission HPI: Dustin [name] is a 54 year old man with GERD, rheumatoid arthritis, hiatal hernia, anxiety, tobacco abuse who presents with chest pain.  On Monday night, he reports having a chest pain like a "knife stabbing through his heart." This pain occurred twice on Tuesday and was associated with a "head rush" which he describes as lightheadedness before occuring twice today. Location of the pain varies though neve radiates or extends anywhere else and is not associated with activity or breathing. He reports his first experience with this pain was 2 months ago while waiting at a stoplight and has tried taking home Xanax without relief; cardiac workup at [facility] was unremarkable at that time. Cardiac cath has been done three times in the past, most recent of which was 2012 without significant stenosis, EF 55%. Of note, he has had multiple family members die of MI: mother at age 51, father at age 6, brother at age 41; his living brother has undergone quadruple bypass. 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 Other than baseline nausea (which he associates with GERD), intermittent dyspnea not associated with exertion, and numbness/tingling associated with C7 nerve damage, he denies fever, abdominal pain, diarrhea, vomiting, vision changes, dizziness, loss of consciousness, leg swelling.   Hospital Course by problem list:   Atypical chest pain: Likely 2/2 anxiety which he reports has been worse recently with caring for his wife's illness and work. Troponins negative x 3, telemetry unremarkable overnight, EKG findings were reassuring to rule out ACS which was strongly suspected given his family history and other risk factors. D-dimer checked for possible PE given nature of pain but also unremarkable. Cardiology was consulted though stress test was negative [see  findings above]. At discharge, he was continued on home aspirin 81 mg daily and will follow up with cardiologist in [place].   DM2: HbA1c was 7.1 during hospital stay. Patient said that he has had a higher HbA1c in the past and was able to lower it with diet and exercise. He was advised to make lifestyle changes and should be reassessed later with PCP.  Hypertriglyceridemia: Lipid panel as noted below. He was started on atorvastatin 10 mg qd to start at home.    Component Value Date/Time   CHOL 180 09/22/2014 2330   TRIG 158* 09/22/2014 2330   HDL 30* 09/22/2014 2330   CHOLHDL 6.0 09/22/2014 2330   VLDL 32 09/22/2014 2330   LDLCALC 118* 09/22/2014 2330   Anxiety: Remained stable on home medications.  Rheumatoid Arthritis: Remained stable on home folate.  GERD: Remained stable on home medications.  Tobacco abuse: He was counseled on the significant health consequences of smoking and is taking bupropion 150 mg daily to help him quit smoking. He was agreeable to trying 21mg  nicotine patch at discharge and should be revisited at follow-up.  Obesity: He was educated on diet and exercise during his hospital stay.  Discharge Vitals:   BP 128/67 mmHg  Pulse 76  Temp(Src) 98.6 F (37 C) (Oral)  Resp 18  Ht 6\' 2"  (1.88 m)  Wt 309 lb 11.2 oz (140.479 kg)  BMI 39.75 kg/m2  SpO2 97%  Discharge Labs:  No results found for this or any previous visit (from the past 24 hour(s)).  Signed: Charlott Rakes, MD 09/29/2014, 10:28 AM    Services Ordered on Discharge: None Equipment Ordered on Discharge: None

## 2014-10-10 ENCOUNTER — Encounter: Payer: Self-pay | Admitting: Adult Health

## 2014-10-10 ENCOUNTER — Other Ambulatory Visit (HOSPITAL_COMMUNITY): Payer: Self-pay | Admitting: Physician Assistant

## 2014-10-10 ENCOUNTER — Ambulatory Visit (INDEPENDENT_AMBULATORY_CARE_PROVIDER_SITE_OTHER): Payer: BLUE CROSS/BLUE SHIELD | Admitting: Adult Health

## 2014-10-10 ENCOUNTER — Encounter: Payer: Self-pay | Admitting: *Deleted

## 2014-10-10 VITALS — BP 119/80 | HR 88 | Ht 73.0 in | Wt 304.0 lb

## 2014-10-10 DIAGNOSIS — R109 Unspecified abdominal pain: Secondary | ICD-10-CM

## 2014-10-10 DIAGNOSIS — R079 Chest pain, unspecified: Secondary | ICD-10-CM

## 2014-10-10 DIAGNOSIS — Z720 Tobacco use: Secondary | ICD-10-CM

## 2014-10-10 NOTE — Progress Notes (Deleted)
Name: Dustin [name]    DOB: 1961/05/17  Age: 54 y.o.  MR#: [id number]       PCP:  Leonides Grills, MD      Insurance: Payor: [place] / Plan: BCBS OTHER / Product Type: *No Product type* /   CC:    Chief Complaint  Patient presents with  . Chest Pain    VS Filed Vitals:   10/10/14 1403  BP: 119/80  Pulse: 88  Height: 6\' 1"  (1.854 m)  Weight: 304 lb (137.893 kg)    Weights Current Weight  10/10/14 304 lb (137.893 kg)  09/23/14 309 lb 11.2 oz (140.479 kg)  10/14/13 270 lb (122.471 kg)    Blood Pressure  BP Readings from Last 3 Encounters:  10/10/14 119/80  09/23/14 128/67  10/14/13 124/72     Admit date:  (Not on file) Last encounter with RMR:  Visit date not found   Allergy Review of patient's allergies indicates no known allergies.  Current Outpatient Prescriptions  Medication Sig Dispense Refill  . ALPRAZolam (XANAX) 1 MG tablet Take 1 mg by mouth 3 (three) times daily as needed for anxiety.    Marland Kitchen aspirin 81 MG tablet Take 81 mg by mouth daily.    Marland Kitchen atorvastatin (LIPITOR) 10 MG tablet Take 1 tablet (10 mg total) by mouth daily. 30 tablet 3  . buPROPion (WELLBUTRIN SR) 150 MG 12 hr tablet Take 150 mg by mouth daily.    . folic acid (FOLVITE) 1 MG tablet Take 1 mg by mouth 2 (two) times daily.    . methotrexate (RHEUMATREX) 2.5 MG tablet Take 25 mg by mouth once a week.  0  . nicotine (NICODERM CQ - DOSED IN MG/24 HOURS) 21 mg/24hr patch Place 1 patch (21 mg total) onto the skin daily. 28 patch 0  . oxyCODONE-acetaminophen (PERCOCET) 10-325 MG per tablet Take 1 tablet by mouth every 4 (four) hours as needed for pain.     . pantoprazole (PROTONIX) 40 MG tablet Take 40 mg by mouth daily.    Marland Kitchen zolpidem (AMBIEN) 10 MG tablet Take 10 mg by mouth at bedtime as needed for sleep.      No current facility-administered medications for this visit.    Discontinued Meds:   There are no discontinued medications.  Patient Active Problem List   Diagnosis Date  Noted  . Chest pain at rest 09/22/2014  . Tobacco use 09/22/2014  . Morbid obesity 09/22/2014  . Rheumatoid arthritis 09/22/2014  . Family history of heart disease 09/22/2014  . Chest pain 09/22/2014    LABS    Component Value Date/Time   NA 139 09/22/2014 1244   NA 140 10/14/2013 1542   NA 136 01/14/2012 1157   K 4.6 09/22/2014 1244   K 4.5 10/14/2013 1542   K 4.3 01/14/2012 1157   CL 105 09/22/2014 1244   CL 102 10/14/2013 1542   CL 101 01/14/2012 1157   CO2 29 09/22/2014 1244   CO2 28 10/14/2013 1542   CO2 25 01/14/2012 1157   GLUCOSE 211* 09/22/2014 1244   GLUCOSE 138* 10/14/2013 1542   GLUCOSE 243* 01/14/2012 1157   BUN 20 09/22/2014 1244   BUN 20 10/14/2013 1542   BUN 17 01/14/2012 1157   CREATININE 1.11 09/22/2014 1244   CREATININE 1.05 10/14/2013 1542   CREATININE 0.89 01/14/2012 1157   CALCIUM 8.8 09/22/2014 1244   CALCIUM 9.4 10/14/2013 1542   CALCIUM 9.0 01/14/2012 1157   GFRNONAA 73* 09/22/2014  [place] 10/14/2013 1542   GFRNONAA >90 01/14/2012 1157   GFRAA 85* 09/22/2014 1244   GFRAA >90 10/14/2013 1542   GFRAA >90 01/14/2012 1157   CMP     Component Value Date/Time   NA 139 09/22/2014 1244   K 4.6 09/22/2014 1244   CL 105 09/22/2014 1244   CO2 29 09/22/2014 1244   GLUCOSE 211* 09/22/2014 1244   BUN 20 09/22/2014 1244   CREATININE 1.11 09/22/2014 1244   CALCIUM 8.8 09/22/2014 1244   PROT 6.9 09/22/2014 1244   ALBUMIN 3.9 09/22/2014 1244   AST 23 09/22/2014 1244   ALT 24 09/22/2014 1244   ALKPHOS 51 09/22/2014 1244   BILITOT 0.8 09/22/2014 1244   GFRNONAA 73* 09/22/2014 1244   GFRAA 85* 09/22/2014 1244       Component Value Date/Time   WBC 8.5 09/22/2014 1244   WBC 8.2 10/14/2013 1542   WBC 9.1 01/14/2012 1157   HGB 13.6 09/22/2014 1244   HGB 14.9 10/14/2013 1542   HGB 13.6 01/14/2012 1157   HCT 41.5 09/22/2014 1244   HCT 44.5 10/14/2013 1542   HCT 40.1 01/14/2012 1157   MCV 99.3 09/22/2014 1244   MCV 99.1 10/14/2013  1542   MCV 98.3 01/14/2012 1157    Lipid Panel     Component Value Date/Time   CHOL 180 09/22/2014 2330   TRIG 158* 09/22/2014 2330   HDL 30* 09/22/2014 2330   CHOLHDL 6.0 09/22/2014 2330   VLDL 32 09/22/2014 2330   LDLCALC 118* 09/22/2014 2330    ABG No results found for: PHART, PCO2ART, PO2ART, HCO3, TCO2, ACIDBASEDEF, O2SAT   No results found for: TSH BNP (last 3 results) No results for input(s): PROBNP in the last 8760 hours. Cardiac Panel (last 3 results) No results for input(s): CKTOTAL, CKMB, TROPONINI, RELINDX in the last 72 hours.  Iron/TIBC/Ferritin/ %Sat No results found for: IRON, TIBC, FERRITIN, IRONPCTSAT   EKG Orders placed or performed during the hospital encounter of 09/22/14  . EKG 12-Lead  . EKG 12-Lead  . EKG 12-Lead (at 6am)  . EKG 12-Lead (at 6am)  . EKG  . Exercise Tolerance Test  . Exercise Tolerance Test     Prior Assessment and Plan Problem List as of 10/10/2014      Musculoskeletal and Integument   Rheumatoid arthritis     Other   Chest pain at rest   Tobacco use   Morbid obesity   Family history of heart disease   Chest pain       Imaging: Dg Chest 2 View  09/22/2014   CLINICAL DATA:  Chronic chest pain, shortness of breath and nausea for the past several weeks. Current history of coronary artery disease. Prior history of tuberculosis. Current smoker (45 pack-year history).  EXAM: CHEST  2 VIEW  COMPARISON:  10/14/2013 dating back to 08/21/2009. CT chest 11/04/2011.  FINDINGS: Cardiomediastinal silhouette unremarkable, unchanged. Emphysematous changes in the upper lobes, right greater than left. pleuroparenchymal scarring at the right apex. Prominent bronchovascular markings diffusely and mild to moderate central peribronchial thickening, unchanged. Lungs otherwise clear. No localized airspace consolidation. No pleural effusions. No pneumothorax. Normal pulmonary vascularity. Mild degenerative changes involving the thoracic spine. Prior  lower cervical and upper thoracic spine fusion.  IMPRESSION: Stable COPD/emphysema.  No acute cardiopulmonary disease.   Electronically Signed   By: Evangeline Dakin M.D.   On: 09/22/2014 13:45   Nm Myocar Multi W/spect W/wall Motion / Ef  09/24/2014  CLINICAL DATA:  Chest pain. History of morbid obesity and smoking. Negative cardiac catheterizations (1999, 2005 and 2012).  EXAM: MYOCARDIAL IMAGING WITH SPECT (REST AND PHARMACOLOGIC-STRESS - 2 DAY PROTOCOL)  GATED LEFT VENTRICULAR WALL MOTION STUDY  LEFT VENTRICULAR EJECTION FRACTION  TECHNIQUE: Standard myocardial SPECT imaging was performed after resting intravenous injection of 30 mCi Tc-76m sestamibi. Subsequently, on a second day, intravenous infusion of Lexiscan was performed under the supervision of the Cardiology staff. At peak effect of the drug, 30 mCi Tc-76m sestamibi was injected intravenously and standard myocardial SPECT imaging was performed. Quantitative gated imaging was also performed to evaluate left ventricular wall motion, and estimate left ventricular ejection fraction.  COMPARISON:  Chest radiograph- 09/22/2014; chest CT -10/02/2010  FINDINGS: Raw images: There is mild patient motion artifact on both the provided rest and stress images. There is no significant chest wall attenuation.  Perfusion: There is a minimal amount of attenuation which involves the inferior and lateral walls of the left ventricle, both of which improves on the provided stress images and is without associated geographic wall motion and no definitive scintigraphic evidence of prior infarction or pharmacologically induced ischemia.  Wall Motion: Normal left ventricular wall motion. No left ventricular dilation.  Left Ventricular Ejection Fraction: 61 %  End diastolic volume 354 ml  End systolic volume 46 ml  IMPRESSION: 1. No scintigraphic evidence of prior infarction or pharmacologically induced ischemia.  2. Normal left ventricular wall motion.  3. Left ventricular  ejection fraction 61%  4. Low-risk stress test findings*.  *2012 Appropriate Use Criteria for Coronary Revascularization Focused Update: J Am Coll Cardiol. 5625;63(8):937-342. http://content.airportbarriers.com.aspx?articleid=1201161   Electronically Signed   By: Sandi Mariscal M.D.   On: 09/24/2014 09:07

## 2014-10-10 NOTE — Patient Instructions (Signed)
Your physician recommends that you schedule a follow-up appointment As needed  Your physician recommends that you continue on your current medications as directed. Please refer to the Current Medication list given to you today.  Thank you for choosing [facility]!    

## 2014-10-10 NOTE — Progress Notes (Signed)
HPI: [name] is a 54 year old patient to be established in our office after admission for atypical chest pain, with history of diabetes, hyperglycemia, ongoing tobacco abuse, and obesity.  The patient described the pain as a knife stabbing through his heart.  His troponins were found to be negative, endocrine, medicine stress test was completed and was found to be negative for ischemia.    He comes today with multiple somatic complaints of anxiety, family issues affecting his overall health, and occasional sharp pain in his back. He unfortunately continues to smoke.      No Known Allergies  Current Outpatient Prescriptions  Medication Sig Dispense Refill  . ALPRAZolam (XANAX) 1 MG tablet Take 1 mg by mouth 3 (three) times daily as needed for anxiety.    Marland Kitchen aspirin 81 MG tablet Take 81 mg by mouth daily.    Marland Kitchen atorvastatin (LIPITOR) 10 MG tablet Take 1 tablet (10 mg total) by mouth daily. 30 tablet 3  . buPROPion (WELLBUTRIN SR) 150 MG 12 hr tablet Take 150 mg by mouth daily.    . folic acid (FOLVITE) 1 MG tablet Take 1 mg by mouth 2 (two) times daily.    . methotrexate (RHEUMATREX) 2.5 MG tablet Take 25 mg by mouth once a week.  0  . nicotine (NICODERM CQ - DOSED IN MG/24 HOURS) 21 mg/24hr patch Place 1 patch (21 mg total) onto the skin daily. 28 patch 0  . oxyCODONE-acetaminophen (PERCOCET) 10-325 MG per tablet Take 1 tablet by mouth every 4 (four) hours as needed for pain.     . pantoprazole (PROTONIX) 40 MG tablet Take 40 mg by mouth daily.    Marland Kitchen zolpidem (AMBIEN) 10 MG tablet Take 10 mg by mouth at bedtime as needed for sleep.      No current facility-administered medications for this visit.    Past Medical History  Diagnosis Date  . Angina     panic attacks- cardiac tests 12  . 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 GERD (gastroesophageal reflux disease)   . H/O hiatal hernia   . Arthritis     rh  . Anxiety   . Depression   . Tuberculosis     hx tx  .  Coronary artery disease     heart cath in 1999, 2005 and 2012 with patent coronary arteries    Past Surgical History  Procedure Laterality Date  . Cervical disc surgery  2  . Tonsillectomy  72  . Hernia repair  60's  . Anterior cervical decomp/discectomy fusion  01/21/2012    Procedure: ANTERIOR CERVICAL DECOMPRESSION/DISCECTOMY FUSION 2 LEVELS;  Surgeon: Erline Levine, MD;  Location: [facility];  Service: Neurosurgery;  Laterality: N/A;  Cervical Six-Seven, Cervical Seven-Thoracic One, Anterior Cervical Decompression with Fusion Interbody Prothesis Plating and Bonegraft possible posterior Cervical Seven-Thoracic One Foraminotomy  . Cardiac catheterization  [phone]    normal coronary arteries    ROS:  Complete review of systems performed and found to be negative unless outlined above  PHYSICAL EXAM BP 119/80 mmHg  Pulse 88  Ht 6\' 1"  (1.854 m)  Wt 304 lb (137.893 kg)  BMI 40.12 kg/m2 General: Well developed, well nourished, in no acute distress Head: Eyes PERRLA, No xanthomas.   Normal cephalic and atramatic  Lungs: Clear bilaterally to auscultation and percussion. Heart: HRRR S1 S2, without MRG.  Pulses are 2+ & equal.  No carotid bruit. No JVD.  No abdominal bruits. No femoral bruits. Abdomen: Bowel sounds are positive, abdomen soft and non-tender without masses or                  Hernia's noted. Msk:  Back normal, normal gait. Normal strength and tone for age. Extremities: No clubbing, cyanosis or edema.  DP +1 Neuro: Alert and oriented X 3. Psych:  Good affect, responds appropriately, anxious.   ASSESSMENT AND PLAN

## 2014-10-10 NOTE — Assessment & Plan Note (Signed)
Stress test was normal. Pain is atypical described as sharp. He has multiple questions concerning the pain. I have reassured him that currently his pain is not cardiac related. He is to see his PCP for further evaluation and testing. Will see him PRN.

## 2014-10-10 NOTE — Assessment & Plan Note (Signed)
I have counseled him on smoking cessation. He is down to a pack a day and states he is under a lot of pressure at home and work. He is not inclined to quit at this time.

## 2014-10-11 ENCOUNTER — Ambulatory Visit (HOSPITAL_COMMUNITY)
Admission: RE | Admit: 2014-10-11 | Discharge: 2014-10-11 | Disposition: A | Discharge status: Home / Self Care | Payer: BLUE CROSS/BLUE SHIELD | Source: Ambulatory Visit | Attending: Physician Assistant | Admitting: Physician Assistant

## 2014-10-11 DIAGNOSIS — R109 Unspecified abdominal pain: Secondary | ICD-10-CM

## 2014-10-11 DIAGNOSIS — R079 Chest pain, unspecified: Secondary | ICD-10-CM

## 2014-10-11 DIAGNOSIS — R1084 Generalized abdominal pain: Secondary | ICD-10-CM | POA: Diagnosis present

## 2014-10-11 DIAGNOSIS — K8020 Calculus of gallbladder without cholecystitis without obstruction: Secondary | ICD-10-CM | POA: Insufficient documentation

## 2014-10-19 NOTE — H&P (Signed)
  NTS SOAP Note  Vital Signs:  Vitals as of: 05/17/4781: Systolic 956: Diastolic 84: Heart Rate 78: Temp 97.67F: Height 38ft 1in: Weight 302Lbs 0 Ounces: Pain Level 3: BMI 39.84  BMI : 39.84 kg/m2  Subjective: This 54 year old male presents for of upper abdominal pain,  nausea,  and reflux.  Has had multiple cardiac caths which have been negative.  Pain radiates to the back.  No fever,  chills,  jaundice.  Made worse with fatty food.  Review of Symptoms:  Constitutional:fatigue Head:unremarkable Eyes:unremarkable   Nose/Mouth/Throat:unremarkable Cardiovascular:  unremarkable Respiratory:wheezing Gastrointestinabdominal pain, nausea Genitourinary:unremarkable   joint,  back,  and neck pain Skin:unremarkable Hematolgic/Lymphatic:unremarkable   Allergic/Immunologic:unremarkable   Past Medical History:  Reviewed  Past Medical History  Surgical History:   neck surgery Medical Problems: arthritis,  reflux Allergies: nkda Medications: methotrexate,  folic acid,  protonix,  baby asa,  ambien,  xanax,  wellbutrin    Social History:Reviewed  Social History  Preferred Language: English Race:  White Ethnicity: Not Hispanic / Latino Age: 48 year Marital Status:  M Alcohol: unknown   Smoking Status: Current every day smoker reviewed on 10/18/2014 Started Date:  Packs per day: 1.00 Functional Status reviewed on 10/18/2014 ------------------------------------------------ Bathing: Normal Cooking: Normal Dressing: Normal Driving: Normal Eating: Normal Managing Meds: Normal Oral Care: Normal Shopping: Normal Toileting: Normal Transferring: Normal Walking: Normal Cognitive Status reviewed on 10/18/2014 ------------------------------------------------ Attention: Normal Decision Making: Normal Language: Normal Memory: Normal Motor: Normal Perception: Normal Problem Solving: Normal Visual and Spatial: Normal   Family History:Reviewed  Family Health  History Mother  Father, Living; Heart disease;     Objective Information: General:Well appearing, well nourished in no distress. Heart:RRR, no murmur or gallop.  Normal S1, S2.  No S3, S4.  Lungs:  CTA bilaterally, no wheezes, rhonchi, rales.  Breathing unlabored. Abdomen:Soft, NT/ND, no HSM, no masses.  Discomfort to palpation in right upper quadrant. U/S of gallbladder:  cholelithiasis,  normal common bile duct. Assessment:Biliary colic,  cholelithiasis  Diagnoses: 574.20  K80.20 Gallstone (Calculus of gallbladder without cholecystitis without obstruction)  Procedures: 21308 - OFFICE OUTPATIENT NEW 30 MINUTES    Plan:  Scheduled for laparoscopic cholecystectomy on 10/26/14.   Patient Education:Alternative treatments to surgery were discussed with patient (and family).  Risks and benefits  of procedure including bleeding,  infection,  hepatobiliary injury,  and the possibility of an open procedure were fully explained to the patient (and family) who gave informed consent. Patient/family questions were addressed.  Follow-up:Pending Surgery

## 2014-10-21 NOTE — Patient Instructions (Signed)
Dustin [name]  10/21/2014   Your procedure is scheduled on:  10/26/14  Report to [facility] at [place] AM.  Call this number if you have problems the morning of surgery: [phone]   Remember:   Do not eat food or drink liquids after midnight.   Take these medicines the morning of surgery with A SIP OF WATER: xanax, wellbutrin, percocet, prontonix   Do not wear jewelry, make-up or nail polish.  Do not wear lotions, powders, or perfumes. You may wear deodorant.  Do not shave 48 hours prior to surgery. Men may shave face and neck.  Do not bring valuables to the hospital.  [facility] is not responsible                  for any belongings or valuables.               Contacts, dentures or bridgework may not be worn into surgery.  Leave suitcase in the car. After surgery it may be brought to your room.  For patients admitted to the hospital, discharge time is determined by your                treatment team.               Patients discharged the day of surgery will not be allowed to drive  home.  Name and phone number of your driver: family  Special Instructions: Shower using CHG 2 nights before surgery and the night before surgery.  If you shower the day of surgery use CHG.  Use special wash - you have one bottle of CHG for all showers.  You should use approximately 1/3 of the bottle for each shower.   Please read over the following fact sheets that you were given: Coughing and Deep Breathing, Surgical Site Infection Prevention and Anesthesia Post-op Instructions    PATIENT INSTRUCTIONS POST-ANESTHESIA  IMMEDIATELY FOLLOWING SURGERY:  Do not drive or operate machinery for the first twenty four hours after surgery.  Do not make any important decisions for twenty four hours after surgery or while taking narcotic pain medications or sedatives.  If you develop intractable nausea and vomiting or a severe headache please notify your doctor immediately.  FOLLOW-UP:  Please make an appointment with  your surgeon as instructed. You do not need to follow up with anesthesia unless specifically instructed to do so.  WOUND CARE INSTRUCTIONS (if applicable):  Keep a dry clean dressing on the anesthesia/puncture wound site if there is drainage.  Once the wound has quit draining you may leave it open to air.  Generally you should leave the bandage intact for twenty four hours unless there is drainage.  If the epidural site drains for more than 36-48 hours please call the anesthesia department.  QUESTIONS?:  Please feel free to call your physician or the hospital operator if you have any questions, and they will be happy to assist you.      Laparoscopic Cholecystectomy Laparoscopic cholecystectomy is surgery to remove the gallbladder. The gallbladder is located in the upper right part of the abdomen, behind the liver. It is a storage sac for bile produced in the liver. Bile aids in the digestion and absorption of fats. Cholecystectomy is often done for inflammation of the gallbladder (cholecystitis). This condition is usually caused by a buildup of gallstones (cholelithiasis) in your gallbladder. Gallstones can block the flow of bile, resulting in inflammation and pain. In severe cases, emergency surgery may be  required. When emergency surgery is not required, you will have time to prepare for the procedure. Laparoscopic surgery is an alternative to open surgery. Laparoscopic surgery has a shorter recovery time. Your common bile duct may also need to be examined during the procedure. If stones are found in the common bile duct, they may be removed. LET [facility] CARE PROVIDER KNOW ABOUT:  Any allergies you have.  All medicines you are taking, including vitamins, herbs, eye drops, creams, and over-the-counter medicines.  Previous problems you or members of your family have had with the use of anesthetics.  Any blood disorders you have.  Previous surgeries you have had.  Medical conditions you  have. RISKS AND COMPLICATIONS Generally, this is a safe procedure. However, as with any procedure, complications can occur. Possible complications include:  Infection.  Damage to the common bile duct, nerves, arteries, veins, or other internal organs such as the stomach, liver, or intestines.  Bleeding.  A stone may remain in the common bile duct.  A bile leak from the cyst duct that is clipped when your gallbladder is removed.  The need to convert to open surgery, which requires a larger incision in the abdomen. This may be necessary if your surgeon thinks it is not safe to continue with a laparoscopic procedure. BEFORE THE PROCEDURE  Ask your health care provider about changing or stopping any regular medicines. You will need to stop taking aspirin or blood thinners at least 5 days prior to surgery.  Do not eat or drink anything after midnight the night before surgery.  Let your health care provider know if you develop a cold or other infectious problem before surgery. PROCEDURE   You will be given medicine to make you sleep through the procedure (general anesthetic). A breathing tube will be placed in your mouth.  When you are asleep, your surgeon will make several small cuts (incisions) in your abdomen.  A thin, lighted tube with a tiny camera on the end (laparoscope) is inserted through one of the small incisions. The camera on the laparoscope sends a picture to a TV screen in the operating room. This gives the surgeon a good view inside your abdomen.  A gas will be pumped into your abdomen. This expands your abdomen so that the surgeon has more room to perform the surgery.  Other tools needed for the procedure are inserted through the other incisions. The gallbladder is removed through one of the incisions.  After the removal of your gallbladder, the incisions will be closed with stitches, staples, or skin glue. AFTER THE PROCEDURE  You will be taken to a recovery area  where your progress will be checked often.  You may be allowed to go home the same day if your pain is controlled and you can tolerate liquids. Document Released: 09/02/2005 Document Revised: 06/23/2013 Document Reviewed: 04/14/2013 [facility] Patient Information 2015 [place], [place]. This information is not intended to replace advice given to you by your health care provider. Make sure you discuss any questions you have with your health care provider.

## 2014-10-24 ENCOUNTER — Encounter (HOSPITAL_COMMUNITY)
Admission: RE | Admit: 2014-10-24 | Discharge: 2014-10-24 | Disposition: A | Discharge status: Home / Self Care | Payer: BLUE CROSS/BLUE SHIELD | Source: Ambulatory Visit | Attending: General Surgery | Admitting: General Surgery

## 2014-10-24 ENCOUNTER — Encounter (HOSPITAL_COMMUNITY): Payer: Self-pay

## 2014-10-24 DIAGNOSIS — K8010 Calculus of gallbladder with chronic cholecystitis without obstruction: Secondary | ICD-10-CM | POA: Diagnosis not present

## 2014-10-24 DIAGNOSIS — R109 Unspecified abdominal pain: Secondary | ICD-10-CM | POA: Diagnosis present

## 2014-10-24 DIAGNOSIS — K219 Gastro-esophageal reflux disease without esophagitis: Secondary | ICD-10-CM | POA: Diagnosis not present

## 2014-10-24 DIAGNOSIS — Z7982 Long term (current) use of aspirin: Secondary | ICD-10-CM | POA: Diagnosis not present

## 2014-10-24 DIAGNOSIS — J449 Chronic obstructive pulmonary disease, unspecified: Secondary | ICD-10-CM | POA: Diagnosis not present

## 2014-10-24 DIAGNOSIS — M1990 Unspecified osteoarthritis, unspecified site: Secondary | ICD-10-CM | POA: Diagnosis not present

## 2014-10-24 DIAGNOSIS — F329 Major depressive disorder, single episode, unspecified: Secondary | ICD-10-CM | POA: Diagnosis not present

## 2014-10-24 DIAGNOSIS — Z79899 Other long term (current) drug therapy: Secondary | ICD-10-CM | POA: Diagnosis not present

## 2014-10-24 DIAGNOSIS — F419 Anxiety disorder, unspecified: Secondary | ICD-10-CM | POA: Diagnosis not present

## 2014-10-24 DIAGNOSIS — I2510 Atherosclerotic heart disease of native coronary artery without angina pectoris: Secondary | ICD-10-CM | POA: Diagnosis not present

## 2014-10-24 DIAGNOSIS — F17200 Nicotine dependence, unspecified, uncomplicated: Secondary | ICD-10-CM | POA: Diagnosis not present

## 2014-10-24 HISTORY — DX: Chronic obstructive pulmonary disease, unspecified: J44.9

## 2014-10-24 LAB — BASIC METABOLIC PANEL
Anion gap: 3 — ABNORMAL LOW (ref 5–15)
BUN: 19 mg/dL (ref 6–23)
CO2: 29 mmol/L (ref 19–32)
Calcium: 9.2 mg/dL (ref 8.4–10.5)
Chloride: 106 mmol/L (ref 96–112)
Creatinine, Ser: 0.95 mg/dL (ref 0.50–1.35)
GFR calc Af Amer: >90 mL/min (ref >90)
GLUCOSE: 161 mg/dL — AB (ref 70–99)
Potassium: 4.9 mmol/L (ref 3.5–5.1)
Sodium: 138 mmol/L (ref 135–145)

## 2014-10-24 LAB — CBC WITH DIFFERENTIAL/PLATELET
BASOS ABS: 0 10*3/uL (ref 0.0–0.1)
Basophils Relative: 0 % (ref 0–1)
Eosinophils Absolute: 0.1 10*3/uL (ref 0.0–0.7)
Eosinophils Relative: 2 % (ref 0–5)
HEMATOCRIT: 44.2 % (ref 39.0–52.0)
Hemoglobin: 14.5 g/dL (ref 13.0–17.0)
LYMPHS ABS: 1.5 10*3/uL (ref 0.7–4.0)
Lymphocytes Relative: 23 % (ref 12–46)
MCH: 32.7 pg (ref 26.0–34.0)
MCHC: 32.8 g/dL (ref 30.0–36.0)
MCV: 99.8 fL (ref 78.0–100.0)
MONOS PCT: 9 % (ref 3–12)
Monocytes Absolute: 0.6 10*3/uL (ref 0.1–1.0)
NEUTROS PCT: 66 % (ref 43–77)
Neutro Abs: 4.2 10*3/uL (ref 1.7–7.7)
Platelets: 205 10*3/uL (ref 150–400)
RBC: 4.43 MIL/uL (ref 4.22–5.81)
RDW: 13.3 % (ref 11.5–15.5)
WBC: 6.4 10*3/uL (ref 4.0–10.5)

## 2014-10-24 LAB — HEPATIC FUNCTION PANEL
ALT: 35 U/L (ref 0–53)
AST: 33 U/L (ref 0–37)
Albumin: 4.1 g/dL (ref 3.5–5.2)
Alkaline Phosphatase: 49 U/L (ref 39–117)
BILIRUBIN INDIRECT: 0.7 mg/dL (ref 0.3–0.9)
Bilirubin, Direct: 0.2 mg/dL (ref 0.0–0.5)
Total Bilirubin: 0.9 mg/dL (ref 0.3–1.2)
Total Protein: 7.3 g/dL (ref 6.0–8.3)

## 2014-10-24 NOTE — Pre-Procedure Instructions (Signed)
Patient given information to sign up for my chart at home. 

## 2014-10-26 ENCOUNTER — Ambulatory Visit (HOSPITAL_COMMUNITY): Payer: BLUE CROSS/BLUE SHIELD | Admitting: Anesthesiology

## 2014-10-26 ENCOUNTER — Encounter (HOSPITAL_COMMUNITY): Payer: Self-pay | Admitting: *Deleted

## 2014-10-26 ENCOUNTER — Ambulatory Visit (HOSPITAL_COMMUNITY)
Admission: RE | Admit: 2014-10-26 | Discharge: 2014-10-26 | Disposition: A | Discharge status: Home / Self Care | Payer: BLUE CROSS/BLUE SHIELD | Source: Ambulatory Visit | Attending: General Surgery | Admitting: General Surgery

## 2014-10-26 ENCOUNTER — Encounter (HOSPITAL_COMMUNITY)
Admission: RE | Disposition: A | Discharge status: Home / Self Care | Payer: Self-pay | Source: Ambulatory Visit | Attending: General Surgery

## 2014-10-26 DIAGNOSIS — F329 Major depressive disorder, single episode, unspecified: Secondary | ICD-10-CM | POA: Insufficient documentation

## 2014-10-26 DIAGNOSIS — I2510 Atherosclerotic heart disease of native coronary artery without angina pectoris: Secondary | ICD-10-CM | POA: Insufficient documentation

## 2014-10-26 DIAGNOSIS — K8010 Calculus of gallbladder with chronic cholecystitis without obstruction: Secondary | ICD-10-CM | POA: Diagnosis not present

## 2014-10-26 DIAGNOSIS — K219 Gastro-esophageal reflux disease without esophagitis: Secondary | ICD-10-CM | POA: Insufficient documentation

## 2014-10-26 DIAGNOSIS — M1990 Unspecified osteoarthritis, unspecified site: Secondary | ICD-10-CM | POA: Insufficient documentation

## 2014-10-26 DIAGNOSIS — F419 Anxiety disorder, unspecified: Secondary | ICD-10-CM | POA: Insufficient documentation

## 2014-10-26 DIAGNOSIS — J449 Chronic obstructive pulmonary disease, unspecified: Secondary | ICD-10-CM | POA: Insufficient documentation

## 2014-10-26 DIAGNOSIS — Z79899 Other long term (current) drug therapy: Secondary | ICD-10-CM | POA: Insufficient documentation

## 2014-10-26 DIAGNOSIS — Z7982 Long term (current) use of aspirin: Secondary | ICD-10-CM | POA: Insufficient documentation

## 2014-10-26 DIAGNOSIS — F17200 Nicotine dependence, unspecified, uncomplicated: Secondary | ICD-10-CM | POA: Insufficient documentation

## 2014-10-26 HISTORY — PX: CHOLECYSTECTOMY: SHX55

## 2014-10-26 SURGERY — LAPAROSCOPIC CHOLECYSTECTOMY
Anesthesia: General | Site: Abdomen

## 2014-10-26 MED ORDER — KETOROLAC TROMETHAMINE 30 MG/ML IJ SOLN
30.0000 mg | Freq: Once | INTRAMUSCULAR | Status: AC
  Administered 2014-10-26: 30 mg via INTRAVENOUS
  Filled 2014-10-26: qty 1

## 2014-10-26 MED ORDER — LACTATED RINGERS IV SOLN
INTRAVENOUS | Status: DC | PRN
  Administered 2014-10-26 (×2): via INTRAVENOUS

## 2014-10-26 MED ORDER — BUPIVACAINE HCL (PF) 0.5 % IJ SOLN
INTRAMUSCULAR | Status: AC
  Filled 2014-10-26: qty 30

## 2014-10-26 MED ORDER — ROCURONIUM BROMIDE 50 MG/5ML IV SOLN
INTRAVENOUS | Status: AC
  Filled 2014-10-26: qty 1

## 2014-10-26 MED ORDER — CIPROFLOXACIN IN D5W 400 MG/200ML IV SOLN
400.0000 mg | INTRAVENOUS | Status: AC
  Administered 2014-10-26: 400 mg via INTRAVENOUS

## 2014-10-26 MED ORDER — ONDANSETRON HCL 4 MG/2ML IJ SOLN
4.0000 mg | Freq: Once | INTRAMUSCULAR | Status: AC
  Administered 2014-10-26: 4 mg via INTRAVENOUS

## 2014-10-26 MED ORDER — PROPOFOL 10 MG/ML IV BOLUS
INTRAVENOUS | Status: AC
  Filled 2014-10-26: qty 20

## 2014-10-26 MED ORDER — LIDOCAINE HCL (PF) 1 % IJ SOLN
INTRAMUSCULAR | Status: AC
  Filled 2014-10-26: qty 5

## 2014-10-26 MED ORDER — ALBUTEROL SULFATE (2.5 MG/3ML) 0.083% IN NEBU
2.5000 mg | INHALATION_SOLUTION | Freq: Once | RESPIRATORY_TRACT | Status: AC
  Administered 2014-10-26: 2.5 mg via RESPIRATORY_TRACT

## 2014-10-26 MED ORDER — OXYCODONE-ACETAMINOPHEN 7.5-325 MG PO TABS: 1.0000 | ORAL_TABLET | ORAL | Status: DC | PRN

## 2014-10-26 MED ORDER — SUCCINYLCHOLINE CHLORIDE 20 MG/ML IJ SOLN
INTRAMUSCULAR | Status: DC | PRN
  Administered 2014-10-26: 140 mg via INTRAVENOUS

## 2014-10-26 MED ORDER — MIDAZOLAM HCL 2 MG/2ML IJ SOLN
INTRAMUSCULAR | Status: AC
  Filled 2014-10-26: qty 2

## 2014-10-26 MED ORDER — MIDAZOLAM HCL 2 MG/2ML IJ SOLN
1.0000 mg | INTRAMUSCULAR | Status: DC | PRN
  Administered 2014-10-26: 2 mg via INTRAVENOUS

## 2014-10-26 MED ORDER — BUPIVACAINE HCL (PF) 0.5 % IJ SOLN
INTRAMUSCULAR | Status: DC | PRN
  Administered 2014-10-26: 10 mL

## 2014-10-26 MED ORDER — PROPOFOL 10 MG/ML IV BOLUS
INTRAVENOUS | Status: DC | PRN
  Administered 2014-10-26: 120 mg via INTRAVENOUS

## 2014-10-26 MED ORDER — ONDANSETRON HCL 4 MG/2ML IJ SOLN: 4.0000 mg | Freq: Once | INTRAMUSCULAR | Status: DC | PRN

## 2014-10-26 MED ORDER — FENTANYL CITRATE 0.05 MG/ML IJ SOLN
INTRAMUSCULAR | Status: AC
  Filled 2014-10-26: qty 5

## 2014-10-26 MED ORDER — CIPROFLOXACIN IN D5W 400 MG/200ML IV SOLN
INTRAVENOUS | Status: AC
  Filled 2014-10-26: qty 200

## 2014-10-26 MED ORDER — NEOSTIGMINE METHYLSULFATE 10 MG/10ML IV SOLN
INTRAVENOUS | Status: DC | PRN
  Administered 2014-10-26: 4 mg via INTRAVENOUS

## 2014-10-26 MED ORDER — CHLORHEXIDINE GLUCONATE 4 % EX LIQD: 1.0000 "application " | Freq: Once | CUTANEOUS | Status: DC

## 2014-10-26 MED ORDER — LIDOCAINE HCL (CARDIAC) 20 MG/ML IV SOLN
INTRAVENOUS | Status: DC | PRN
Start: 2014-10-26 — End: 2014-10-26
  Administered 2014-10-26: 50 mg via INTRAVENOUS

## 2014-10-26 MED ORDER — SODIUM CHLORIDE 0.9 % IN NEBU
INHALATION_SOLUTION | RESPIRATORY_TRACT | Status: AC
  Filled 2014-10-26: qty 3

## 2014-10-26 MED ORDER — POVIDONE-IODINE 10 % EX OINT
TOPICAL_OINTMENT | CUTANEOUS | Status: AC
  Filled 2014-10-26: qty 1

## 2014-10-26 MED ORDER — SODIUM CHLORIDE 0.9 % IR SOLN
Status: DC | PRN
  Administered 2014-10-26: 1000 mL

## 2014-10-26 MED ORDER — FENTANYL CITRATE 0.05 MG/ML IJ SOLN
INTRAMUSCULAR | Status: DC | PRN
  Administered 2014-10-26 (×5): 50 ug via INTRAVENOUS

## 2014-10-26 MED ORDER — ONDANSETRON HCL 4 MG/2ML IJ SOLN
INTRAMUSCULAR | Status: AC
  Filled 2014-10-26: qty 2

## 2014-10-26 MED ORDER — POVIDONE-IODINE 10 % OINT PACKET
TOPICAL_OINTMENT | CUTANEOUS | Status: DC | PRN
Start: 2014-10-26 — End: 2014-10-26
  Administered 2014-10-26: 1 via TOPICAL

## 2014-10-26 MED ORDER — LACTATED RINGERS IV SOLN
INTRAVENOUS | Status: DC
  Administered 2014-10-26: 09:00:00 via INTRAVENOUS

## 2014-10-26 MED ORDER — FENTANYL CITRATE 0.05 MG/ML IJ SOLN
25.0000 ug | INTRAMUSCULAR | Status: DC | PRN
  Administered 2014-10-26 (×3): 50 ug via INTRAVENOUS
  Filled 2014-10-26 (×2): qty 2

## 2014-10-26 MED ORDER — HEMOSTATIC AGENTS (NO CHARGE) OPTIME
TOPICAL | Status: DC | PRN
  Administered 2014-10-26: 1 via TOPICAL

## 2014-10-26 MED ORDER — GLYCOPYRROLATE 0.2 MG/ML IJ SOLN
INTRAMUSCULAR | Status: DC | PRN
  Administered 2014-10-26: 0.6 mg via INTRAVENOUS

## 2014-10-26 MED ORDER — ROCURONIUM BROMIDE 100 MG/10ML IV SOLN
INTRAVENOUS | Status: DC | PRN
  Administered 2014-10-26: 20 mg via INTRAVENOUS
  Administered 2014-10-26: 10 mg via INTRAVENOUS

## 2014-10-26 MED ORDER — ALBUTEROL SULFATE (2.5 MG/3ML) 0.083% IN NEBU
INHALATION_SOLUTION | RESPIRATORY_TRACT | Status: AC
  Filled 2014-10-26: qty 3

## 2014-10-26 SURGICAL SUPPLY — 46 items
APPLIER CLIP LAPSCP 10X32 DD (CLIP) ×2 IMPLANT
BAG HAMPER (MISCELLANEOUS) ×2 IMPLANT
BAG SPEC RTRVL LRG 6X4 10 (ENDOMECHANICALS) ×1
BLADE 11 SAFETY STRL DISP (BLADE) ×2 IMPLANT
CHLORAPREP W/TINT 26ML (MISCELLANEOUS) ×2 IMPLANT
CLOTH BEACON ORANGE TIMEOUT ST (SAFETY) ×2 IMPLANT
COVER LIGHT HANDLE STERIS (MISCELLANEOUS) ×4 IMPLANT
DECANTER SPIKE VIAL GLASS SM (MISCELLANEOUS) ×2 IMPLANT
ELECT REM PT RETURN 9FT ADLT (ELECTROSURGICAL) ×2
ELECTRODE REM PT RTRN 9FT ADLT (ELECTROSURGICAL) ×1 IMPLANT
FILTER SMOKE EVAC LAPAROSHD (FILTER) ×2 IMPLANT
FORMALIN 10 PREFIL 120ML (MISCELLANEOUS) ×2 IMPLANT
GLOVE BIOGEL M 7.0 STRL (GLOVE) ×2 IMPLANT
GLOVE BIOGEL PI IND STRL 7.0 (GLOVE) IMPLANT
GLOVE BIOGEL PI IND STRL 7.5 (GLOVE) IMPLANT
GLOVE BIOGEL PI INDICATOR 7.0 (GLOVE) ×2
GLOVE BIOGEL PI INDICATOR 7.5 (GLOVE) ×1
GLOVE EXAM NITRILE MD LF STRL (GLOVE) ×1 IMPLANT
GLOVE SURG SS PI 7.5 STRL IVOR (GLOVE) ×3 IMPLANT
GOWN STRL REUS W/ TWL XL LVL3 (GOWN DISPOSABLE) ×1 IMPLANT
GOWN STRL REUS W/TWL LRG LVL3 (GOWN DISPOSABLE) ×4 IMPLANT
GOWN STRL REUS W/TWL XL LVL3 (GOWN DISPOSABLE) ×4
HEMOSTAT SNOW SURGICEL 2X4 (HEMOSTASIS) ×2 IMPLANT
INST SET LAPROSCOPIC AP (KITS) ×2 IMPLANT
IV NS IRRIG 3000ML ARTHROMATIC (IV SOLUTION) IMPLANT
KIT ROOM TURNOVER APOR (KITS) ×2 IMPLANT
MANIFOLD NEPTUNE II (INSTRUMENTS) ×2 IMPLANT
NDL INSUFFLATION 14GA 120MM (NEEDLE) ×1 IMPLANT
NEEDLE INSUFFLATION 14GA 120MM (NEEDLE) ×2 IMPLANT
NS IRRIG 1000ML POUR BTL (IV SOLUTION) ×2 IMPLANT
PACK LAP CHOLE LZT030E (CUSTOM PROCEDURE TRAY) ×2 IMPLANT
PAD ARMBOARD 7.5X6 YLW CONV (MISCELLANEOUS) ×2 IMPLANT
POUCH SPECIMEN RETRIEVAL 10MM (ENDOMECHANICALS) ×2 IMPLANT
SET BASIN LINEN APH (SET/KITS/TRAYS/PACK) ×2 IMPLANT
SET TUBE IRRIG SUCTION NO TIP (IRRIGATION / IRRIGATOR) IMPLANT
SLEEVE ENDOPATH XCEL 5M (ENDOMECHANICALS) ×2 IMPLANT
SPONGE GAUZE 2X2 8PLY STRL LF (GAUZE/BANDAGES/DRESSINGS) ×8 IMPLANT
STAPLER VISISTAT (STAPLE) ×2 IMPLANT
SUT VICRYL 0 UR6 27IN ABS (SUTURE) ×2 IMPLANT
TAPE CLOTH SURG 4X10 WHT LF (GAUZE/BANDAGES/DRESSINGS) ×1 IMPLANT
TROCAR ENDO BLADELESS 11MM (ENDOMECHANICALS) ×2 IMPLANT
TROCAR XCEL NON-BLD 5MMX100MML (ENDOMECHANICALS) ×2 IMPLANT
TROCAR XCEL UNIV SLVE 11M 100M (ENDOMECHANICALS) ×2 IMPLANT
TUBING INSUFFLATION (TUBING) ×2 IMPLANT
WARMER LAPAROSCOPE (MISCELLANEOUS) ×2 IMPLANT
YANKAUER SUCT 12FT TUBE ARGYLE (SUCTIONS) ×2 IMPLANT

## 2014-10-26 NOTE — Discharge Instructions (Signed)

## 2014-10-26 NOTE — Op Note (Signed)
Patient:  Dustin [name]  DOB:  07-14-1961  MRN:  [id number]   Preop Diagnosis:  Cholecystitis, cholelithiasis  Postop Diagnosis:  Same  Procedure:  Laparoscopic cholecystectomy  Surgeon:  Aviva Signs, M.D.  Anes:  Gen. endotracheal  Indications:  Patient is a 54 year old white male who presents with biliary colic secondary to cholelithiasis. The risks and benefits of the procedure including bleeding, infection, hepatobiliary injury, the possibility of an open procedure were fully explained to the patient, who gave informed consent.    Procedure note:  The patient was placed the supine position. After induction of general endotracheal anesthesia, the abdomen was prepped and draped using usual sterile technique with ChloraPrep. Surgical site confirmation was performed.  A supraumbilical incision was made down to the fascia. A Veress needle was introduced into the abdominal cavity and confirmation of placement was done using the saline drop test. The abdomen was then insufflated to 16 mmHg pressure. An 11 mm trocar was introduced into the abdominal cavity under direct visualization without difficulty. The patient was placed in reverse Trendelenburg position and additional 11 mm trocar was placed the epigastric region and 5 mm trochars were placed the right upper quadrant and right flank regions. Liver was inspected and noted to be somewhat fatty in nature. The gallbladder was retracted in a dynamic fashion in order to expose the triangle of Calot. The cystic duct was first identified. Its junction to the infundibulum was fully identified. Endoclips were placed proximally and distally on the cystic duct, and the cystic duct was divided. This was likewise done to the cystic artery. The gallbladder was freed away from the gallbladder fossa using Bovie electrocautery. The gallbladder was delivered through the epigastric trocar site using an Endo Catch bag. The gallbladder fossa was inspected and no  abnormal bleeding or bile leakage was noted. Surgicel is placed the gallbladder fossa. All fluid and air were then evacuated from the abdominal cavity prior to removal of the trochars.  All wounds were irrigated with normal saline. All wounds were injected with 0.5% Sensorcaine. The supraumbilical fascia as well as epigastric fascia were reapproximated using 0 Vicryl interrupted suture. All skin incisions were closed using staples. Betadine ointment and dry sterile dressings were applied.  All tape and needle counts were correct at the end of the procedure. The patient was extubated in the operating room and transferred to PACU in stable condition.  Complications:  None  EBL:  Minimal  Specimen:  Gallbladder

## 2014-10-26 NOTE — Transfer of Care (Signed)
Immediate Anesthesia Transfer of Care Note  Patient: Dustin [name]  Procedure(s) Performed: Procedure(s): LAPAROSCOPIC CHOLECYSTECTOMY (N/A)  Patient Location: PACU  Anesthesia Type:General  Level of Consciousness: awake, oriented and patient cooperative  Airway & Oxygen Therapy: Patient Spontanous Breathing and Patient connected to face mask oxygen  Post-op Assessment: Report given to RN and Post -op Vital signs reviewed and stable  Post vital signs: Reviewed and stable  Last Vitals:  Filed Vitals:   10/26/14 0940  BP: 120/70  Pulse:   Temp:   Resp: 34    Complications: No apparent anesthesia complications

## 2014-10-26 NOTE — Interval H&P Note (Signed)
History and Physical Interval Note:  10/26/2014 8:54 AM  Dustin [name]  has presented today for surgery, with the diagnosis of cholelithiasis  The various methods of treatment have been discussed with the patient and family. After consideration of risks, benefits and other options for treatment, the patient has consented to  Procedure(s): LAPAROSCOPIC CHOLECYSTECTOMY (N/A) as a surgical intervention .  The patient's history has been reviewed, patient examined, no change in status, stable for surgery.  I have reviewed the patient's chart and labs.  Questions were answered to the patient's satisfaction.     Aviva Signs A

## 2014-10-26 NOTE — Anesthesia Preprocedure Evaluation (Signed)
Anesthesia Evaluation  Patient identified by MRN, date of birth, ID band Patient awake    Reviewed: Allergy & Precautions, H&P , NPO status , Patient's Chart, lab work & pertinent test results  Airway Mallampati: I  TM Distance: >3 FB Neck ROM: Full    Dental  (+) Teeth Intact   Pulmonary neg pulmonary ROS, COPDCurrent Smoker,  breath sounds clear to auscultation        Cardiovascular + angina (neg stress test) + CAD Rhythm:Regular Rate:Normal     Neuro/Psych PSYCHIATRIC DISORDERS Anxiety Depression negative neurological ROS     GI/Hepatic Neg liver ROS, hiatal hernia, GERD-  Controlled,  Endo/Other  Morbid obesity  Renal/GU negative Renal ROS     Musculoskeletal  (+) Arthritis -, Rheumatoid disorders,    Abdominal   Peds  Hematology negative hematology ROS (+)   Anesthesia Other Findings   Reproductive/Obstetrics                             Anesthesia Physical Anesthesia Plan  ASA: III  Anesthesia Plan: General   Post-op Pain Management:    Induction: Intravenous, Rapid sequence and Cricoid pressure planned  Airway Management Planned: Oral ETT  Additional Equipment:   Intra-op Plan:   Post-operative Plan: Extubation in OR  Informed Consent: I have reviewed the patients History and Physical, chart, labs and discussed the procedure including the risks, benefits and alternatives for the proposed anesthesia with the patient or authorized representative who has indicated his/her understanding and acceptance.     Plan Discussed with:   Anesthesia Plan Comments:         Anesthesia Quick Evaluation

## 2014-10-26 NOTE — Anesthesia Procedure Notes (Signed)
Procedure Name: Intubation Date/Time: 10/26/2014 9:52 AM Performed by: Andree Elk, Montoya Brandel A Pre-anesthesia Checklist: Patient identified, Patient being monitored, Timeout performed, Emergency Drugs available and Suction available Patient Re-evaluated:Patient Re-evaluated prior to inductionOxygen Delivery Method: Circle System Utilized Preoxygenation: Pre-oxygenation with 100% oxygen Intubation Type: IV induction, Rapid sequence and Cricoid Pressure applied Laryngoscope Size: 3 and Miller Grade View: Grade I Tube type: Oral Tube size: 7.0 mm Number of attempts: 1 Airway Equipment and Method: Stylet Placement Confirmation: ETT inserted through vocal cords under direct vision,  positive ETCO2 and breath sounds checked- equal and bilateral Secured at: 21 cm Tube secured with: Tape Dental Injury: Teeth and Oropharynx as per pre-operative assessment

## 2014-10-26 NOTE — Anesthesia Postprocedure Evaluation (Signed)
  Anesthesia Post-op Note  Patient: Dustin [name]  Procedure(s) Performed: Procedure(s): LAPAROSCOPIC CHOLECYSTECTOMY (N/A)  Patient Location: PACU  Anesthesia Type:General  Level of Consciousness: awake, alert , oriented and patient cooperative  Airway and Oxygen Therapy: Patient Spontanous Breathing and Patient connected to face mask oxygen  Post-op Pain: mild  Post-op Assessment: Post-op Vital signs reviewed, Patient's Cardiovascular Status Stable, Respiratory Function Stable, Patent Airway, No signs of Nausea or vomiting and Pain level controlled  Post-op Vital Signs: Reviewed and stable  Last Vitals:  Filed Vitals:   10/26/14 1045  BP: 145/85  Pulse: 97  Temp:   Resp: 19    Complications: No apparent anesthesia complications

## 2014-10-27 ENCOUNTER — Encounter (HOSPITAL_COMMUNITY): Payer: Self-pay | Admitting: General Surgery

## 2014-12-15 ENCOUNTER — Other Ambulatory Visit: Payer: Self-pay | Admitting: Orthopaedic Surgery

## 2015-12-25 DIAGNOSIS — M0579 Rheumatoid arthritis with rheumatoid factor of multiple sites without organ or systems involvement: Secondary | ICD-10-CM | POA: Diagnosis not present

## 2015-12-25 DIAGNOSIS — Z09 Encounter for follow-up examination after completed treatment for conditions other than malignant neoplasm: Secondary | ICD-10-CM | POA: Diagnosis not present

## 2015-12-25 DIAGNOSIS — M170 Bilateral primary osteoarthritis of knee: Secondary | ICD-10-CM | POA: Diagnosis not present

## 2015-12-25 DIAGNOSIS — M19241 Secondary osteoarthritis, right hand: Secondary | ICD-10-CM | POA: Diagnosis not present

## 2015-12-25 DIAGNOSIS — Z79899 Other long term (current) drug therapy: Secondary | ICD-10-CM | POA: Diagnosis not present

## 2016-01-01 DIAGNOSIS — E1165 Type 2 diabetes mellitus with hyperglycemia: Secondary | ICD-10-CM | POA: Diagnosis not present

## 2016-01-01 DIAGNOSIS — E119 Type 2 diabetes mellitus without complications: Secondary | ICD-10-CM | POA: Diagnosis not present

## 2016-01-01 DIAGNOSIS — Z1389 Encounter for screening for other disorder: Secondary | ICD-10-CM | POA: Diagnosis not present

## 2016-01-01 DIAGNOSIS — Z6838 Body mass index (BMI) 38.0-38.9, adult: Secondary | ICD-10-CM | POA: Diagnosis not present

## 2016-01-01 DIAGNOSIS — Z0001 Encounter for general adult medical examination with abnormal findings: Secondary | ICD-10-CM | POA: Diagnosis not present

## 2016-01-20 ENCOUNTER — Observation Stay (HOSPITAL_COMMUNITY)
Admission: EM | Admit: 2016-01-20 | Discharge: 2016-01-23 | Disposition: A | Discharge status: Home / Self Care | Payer: BLUE CROSS/BLUE SHIELD | Attending: Internal Medicine | Admitting: Internal Medicine

## 2016-01-20 ENCOUNTER — Encounter (HOSPITAL_COMMUNITY): Payer: Self-pay | Admitting: *Deleted

## 2016-01-20 ENCOUNTER — Emergency Department (HOSPITAL_COMMUNITY): Payer: BLUE CROSS/BLUE SHIELD

## 2016-01-20 DIAGNOSIS — R0789 Other chest pain: Secondary | ICD-10-CM | POA: Diagnosis not present

## 2016-01-20 DIAGNOSIS — Z8249 Family history of ischemic heart disease and other diseases of the circulatory system: Secondary | ICD-10-CM | POA: Diagnosis not present

## 2016-01-20 DIAGNOSIS — F418 Other specified anxiety disorders: Secondary | ICD-10-CM | POA: Insufficient documentation

## 2016-01-20 DIAGNOSIS — E119 Type 2 diabetes mellitus without complications: Secondary | ICD-10-CM | POA: Diagnosis not present

## 2016-01-20 DIAGNOSIS — Z79899 Other long term (current) drug therapy: Secondary | ICD-10-CM | POA: Insufficient documentation

## 2016-01-20 DIAGNOSIS — I2510 Atherosclerotic heart disease of native coronary artery without angina pectoris: Secondary | ICD-10-CM | POA: Insufficient documentation

## 2016-01-20 DIAGNOSIS — J449 Chronic obstructive pulmonary disease, unspecified: Secondary | ICD-10-CM | POA: Diagnosis not present

## 2016-01-20 DIAGNOSIS — Z87891 Personal history of nicotine dependence: Secondary | ICD-10-CM | POA: Diagnosis not present

## 2016-01-20 DIAGNOSIS — M069 Rheumatoid arthritis, unspecified: Secondary | ICD-10-CM | POA: Insufficient documentation

## 2016-01-20 DIAGNOSIS — Z9289 Personal history of other medical treatment: Secondary | ICD-10-CM

## 2016-01-20 DIAGNOSIS — K219 Gastro-esophageal reflux disease without esophagitis: Secondary | ICD-10-CM | POA: Diagnosis not present

## 2016-01-20 DIAGNOSIS — R079 Chest pain, unspecified: Secondary | ICD-10-CM | POA: Diagnosis not present

## 2016-01-20 DIAGNOSIS — E785 Hyperlipidemia, unspecified: Secondary | ICD-10-CM | POA: Diagnosis not present

## 2016-01-20 DIAGNOSIS — R072 Precordial pain: Principal | ICD-10-CM | POA: Insufficient documentation

## 2016-01-20 DIAGNOSIS — Z7984 Long term (current) use of oral hypoglycemic drugs: Secondary | ICD-10-CM | POA: Diagnosis not present

## 2016-01-20 DIAGNOSIS — Z7982 Long term (current) use of aspirin: Secondary | ICD-10-CM | POA: Diagnosis not present

## 2016-01-20 DIAGNOSIS — Z6837 Body mass index (BMI) 37.0-37.9, adult: Secondary | ICD-10-CM | POA: Diagnosis not present

## 2016-01-20 DIAGNOSIS — R7989 Other specified abnormal findings of blood chemistry: Secondary | ICD-10-CM

## 2016-01-20 LAB — BASIC METABOLIC PANEL
Anion gap: 10 (ref 5–15)
BUN: 18 mg/dL (ref 6–20)
CHLORIDE: 104 mmol/L (ref 101–111)
CO2: 25 mmol/L (ref 22–32)
Calcium: 9.3 mg/dL (ref 8.9–10.3)
Creatinine, Ser: 1.05 mg/dL (ref 0.61–1.24)
GFR calc Af Amer: >60 mL/min (ref >60)
GFR calc non Af Amer: >60 mL/min (ref >60)
Glucose, Bld: 176 mg/dL — ABNORMAL HIGH (ref 65–99)
POTASSIUM: 4.3 mmol/L (ref 3.5–5.1)
Sodium: 139 mmol/L (ref 135–145)

## 2016-01-20 LAB — POCT I-STAT TROPONIN I: Troponin i, poc: 0.01 ng/mL (ref 0.00–0.08)

## 2016-01-20 LAB — CBC
HEMATOCRIT: 40.2 % (ref 39.0–52.0)
Hemoglobin: 13.1 g/dL (ref 13.0–17.0)
MCH: 32.1 pg (ref 26.0–34.0)
MCHC: 32.6 g/dL (ref 30.0–36.0)
MCV: 98.5 fL (ref 78.0–100.0)
Platelets: 232 10*3/uL (ref 150–400)
RBC: 4.08 MIL/uL — ABNORMAL LOW (ref 4.22–5.81)
RDW: 13.4 % (ref 11.5–15.5)
WBC: 7.6 10*3/uL (ref 4.0–10.5)

## 2016-01-20 LAB — TROPONIN I: Troponin I: <0.03 ng/mL (ref <0.031)

## 2016-01-20 LAB — I-STAT TROPONIN, ED: Troponin i, poc: 0.01 ng/mL (ref 0.00–0.08)

## 2016-01-20 LAB — GLUCOSE, CAPILLARY: GLUCOSE-CAPILLARY: 95 mg/dL (ref 65–99)

## 2016-01-20 MED ORDER — ATORVASTATIN CALCIUM 10 MG PO TABS
10.0000 mg | ORAL_TABLET | Freq: Every day | ORAL | Status: DC
  Administered 2016-01-20 – 2016-01-23 (×3): 10 mg via ORAL
  Filled 2016-01-20 (×3): qty 1

## 2016-01-20 MED ORDER — SODIUM CHLORIDE 0.9 % IV BOLUS (SEPSIS)
500.0000 mL | Freq: Once | INTRAVENOUS | Status: AC
  Administered 2016-01-20: 500 mL via INTRAVENOUS

## 2016-01-20 MED ORDER — SODIUM CHLORIDE 0.9% FLUSH
3.0000 mL | Freq: Two times a day (BID) | INTRAVENOUS | Status: DC
  Administered 2016-01-20 – 2016-01-22 (×3): 3 mL via INTRAVENOUS

## 2016-01-20 MED ORDER — ZOLPIDEM TARTRATE 5 MG PO TABS
10.0000 mg | ORAL_TABLET | Freq: Every evening | ORAL | Status: DC | PRN
  Administered 2016-01-20 – 2016-01-22 (×3): 10 mg via ORAL
  Filled 2016-01-20 (×3): qty 2

## 2016-01-20 MED ORDER — BUPROPION HCL ER (SR) 150 MG PO TB12
150.0000 mg | ORAL_TABLET | Freq: Every day | ORAL | Status: DC
  Filled 2016-01-20: qty 1

## 2016-01-20 MED ORDER — IBUPROFEN 800 MG PO TABS: 800.0000 mg | ORAL_TABLET | Freq: Four times a day (QID) | ORAL | Status: DC | PRN

## 2016-01-20 MED ORDER — ASPIRIN 81 MG PO CHEW
324.0000 mg | CHEWABLE_TABLET | Freq: Once | ORAL | Status: AC
  Administered 2016-01-20: 324 mg via ORAL
  Filled 2016-01-20: qty 4

## 2016-01-20 MED ORDER — PANTOPRAZOLE SODIUM 40 MG PO TBEC
40.0000 mg | DELAYED_RELEASE_TABLET | Freq: Every day | ORAL | Status: DC
  Administered 2016-01-20 – 2016-01-23 (×4): 40 mg via ORAL
  Filled 2016-01-20 (×4): qty 1

## 2016-01-20 MED ORDER — FOLIC ACID 1 MG PO TABS
1.0000 mg | ORAL_TABLET | Freq: Two times a day (BID) | ORAL | Status: DC
  Administered 2016-01-20 – 2016-01-23 (×5): 1 mg via ORAL
  Filled 2016-01-20 (×5): qty 1

## 2016-01-20 MED ORDER — ACETAMINOPHEN 325 MG PO TABS
650.0000 mg | ORAL_TABLET | Freq: Four times a day (QID) | ORAL | Status: DC | PRN
  Administered 2016-01-21: 650 mg via ORAL
  Filled 2016-01-20: qty 2

## 2016-01-20 MED ORDER — ENOXAPARIN SODIUM 40 MG/0.4ML ~~LOC~~ SOLN
40.0000 mg | SUBCUTANEOUS | Status: DC
  Administered 2016-01-20 – 2016-01-21 (×2): 40 mg via SUBCUTANEOUS
  Filled 2016-01-20 (×2): qty 0.4

## 2016-01-20 MED ORDER — NITROGLYCERIN 0.4 MG SL SUBL
0.4000 mg | SUBLINGUAL_TABLET | SUBLINGUAL | Status: DC | PRN
  Administered 2016-01-20: 0.4 mg via SUBLINGUAL
  Filled 2016-01-20: qty 1

## 2016-01-20 MED ORDER — METFORMIN HCL 500 MG PO TABS: 500.0000 mg | ORAL_TABLET | Freq: Two times a day (BID) | ORAL | Status: DC

## 2016-01-20 MED ORDER — ALPRAZOLAM 0.5 MG PO TABS
1.0000 mg | ORAL_TABLET | Freq: Three times a day (TID) | ORAL | Status: DC | PRN
Start: 2016-01-20 — End: 2016-01-23
  Administered 2016-01-20 – 2016-01-23 (×5): 1 mg via ORAL
  Filled 2016-01-20 (×5): qty 2

## 2016-01-20 MED ORDER — ASPIRIN EC 81 MG PO TBEC
162.0000 mg | DELAYED_RELEASE_TABLET | Freq: Every day | ORAL | Status: DC
Start: 2016-01-20 — End: 2016-01-23
  Administered 2016-01-21 – 2016-01-23 (×2): 162 mg via ORAL
  Filled 2016-01-20 (×3): qty 2

## 2016-01-20 MED ORDER — ONDANSETRON HCL 4 MG/2ML IJ SOLN: 4.0000 mg | Freq: Four times a day (QID) | INTRAMUSCULAR | Status: DC | PRN

## 2016-01-20 NOTE — ED Notes (Signed)
Admitting at bedside 

## 2016-01-20 NOTE — ED Provider Notes (Signed)
CSN: [id number]     Arrival date & time 01/20/16  1544 History   First MD Initiated Contact with Patient 01/20/16 1703     Chief Complaint  Patient presents with  . Chest Pain     (Consider location/radiation/quality/duration/timing/severity/associated sxs/prior Treatment) Patient is a 55 y.o. male presenting with general illness. The history is provided by the patient.  Illness Location:  Chest pain Severity:  Moderate Onset quality:  Sudden Duration:  3 weeks Timing:  Intermittent Progression:  Partially resolved Chronicity:  Recurrent Context:  Multiple prior evals for ACS. Most recent cath 2012; reported to by WNL, no stents placed. Neuclear stress test 09/2014 WNL. Strong FHx MI at young ages. Personal hx DM, HLD, prior smoker, obese. Now with 3wks of intermittent CP. Today worse, exertional, left sided, tightness, +nausea, +radiated to left arm and neck, +diaphoresis.  Associated symptoms: chest pain   Associated symptoms: no abdominal pain, no cough, no diarrhea, no fever, no headaches, no nausea, no shortness of breath and no vomiting     Past Medical History  Diagnosis Date  . Angina     panic attacks- cardiac tests 12  . 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 GERD (gastroesophageal reflux disease)   . H/O hiatal hernia   . Anxiety   . Depression   . Tuberculosis     hx tx  . COPD (chronic obstructive pulmonary disease) ([place])   . Arthritis     RA  . Coronary artery disease     heart cath in 1999, 2005 and 2012 with patent coronary arteries   Past Surgical History  Procedure Laterality Date  . Cervical disc surgery  2  . Tonsillectomy  72  . Anterior cervical decomp/discectomy fusion  01/21/2012    Procedure: ANTERIOR CERVICAL DECOMPRESSION/DISCECTOMY FUSION 2 LEVELS;  Surgeon: Erline Levine, MD;  Location: [place];  Service: Neurosurgery;  Laterality: N/A;  Cervical Six-Seven, Cervical Seven-Thoracic One, Anterior Cervical Decompression with  Fusion Interbody Prothesis Plating and Bonegraft possible posterior Cervical Seven-Thoracic One Foraminotomy  . Cardiac catheterization  [phone]    normal coronary arteries  . Hernia repair Right 60's  . Cholecystectomy N/A 10/26/2014    Procedure: LAPAROSCOPIC CHOLECYSTECTOMY;  Surgeon: Jamesetta So, MD;  Location: [facility];  Service: General;  Laterality: N/A;   Family History  Problem Relation Age of Onset  . Heart disease Mother   . COPD Mother   . Heart attack Father     sudden death  . Heart attack Sister 62  . Kidney disease Sister   . Heart disease Sister   . Heart attack Brother   . Heart disease Brother   . Diabetes Brother   . Heart attack Brother   . Heart disease Brother 22    CABG  . Heart attack Brother 66   Social History  Substance Use Topics  . Smoking status: Current Every Day Smoker -- 0.50 packs/day for 30 years    Types: Cigarettes    Start date: 03/13/1974  . Smokeless tobacco: Never Used  . Alcohol Use: No    Review of Systems  Constitutional: Negative for fever and chills.  Respiratory: Negative for cough and shortness of breath.   Cardiovascular: Positive for chest pain. Negative for leg swelling.  Gastrointestinal: Negative for nausea, vomiting, abdominal pain and diarrhea.  Musculoskeletal: Negative for back pain.  Neurological: Negative for light-headedness and headaches.  All other systems reviewed and are negative.  Allergies  Review of patient's allergies indicates no known allergies.  Home Medications   Prior to Admission medications   Medication Sig Start Date End Date Taking? Authorizing Provider  ALPRAZolam Duanne Moron) 1 MG tablet Take 1 mg by mouth 3 (three) times daily as needed for anxiety.    Historical Provider, MD  aspirin 81 MG tablet Take 81 mg by mouth daily.    Historical Provider, MD  atorvastatin (LIPITOR) 10 MG tablet Take 1 tablet (10 mg total) by mouth daily. 09/24/14   Ejiroghene Arlyce Dice, MD  buPROPion  (WELLBUTRIN SR) 150 MG 12 hr tablet Take 150 mg by mouth daily.    Historical Provider, MD  folic acid (FOLVITE) 1 MG tablet Take 1 mg by mouth 2 (two) times daily.    Historical Provider, MD  ibuprofen (ADVIL,MOTRIN) 200 MG tablet Take 800 mg by mouth every 4 (four) hours as needed (arthritis pain).    Historical Provider, MD  methotrexate (RHEUMATREX) 2.5 MG tablet Take 25 mg by mouth once a week. Takes on Saturday. 07/02/14   Historical Provider, MD  nicotine (NICODERM CQ - DOSED IN MG/24 HOURS) 21 mg/24hr patch Place 1 patch (21 mg total) onto the skin daily. Patient not taking: Reported on 10/24/2014 09/24/14   Ejiroghene Arlyce Dice, MD  oxyCODONE-acetaminophen (PERCOCET) 7.5-325 MG per tablet Take 1-2 tablets by mouth every 4 (four) hours as needed. 10/26/14   Aviva Signs, MD  pantoprazole (PROTONIX) 40 MG tablet Take 40 mg by mouth daily.    Historical Provider, MD  zolpidem (AMBIEN) 10 MG tablet Take 10 mg by mouth at bedtime as needed for sleep.     Historical Provider, MD   BP 160/78 mmHg  Pulse 93  Temp(Src) 98.5 F (36.9 C) (Oral)  Resp 18  SpO2 96% Physical Exam  Constitutional: He is oriented to person, place, and time. He appears well-developed and well-nourished. No distress.  HENT:  Head: Normocephalic and atraumatic.  Eyes: EOM are normal. Pupils are equal, round, and reactive to light.  Cardiovascular: Normal rate, regular rhythm and intact distal pulses.   Pulmonary/Chest: Effort normal. No respiratory distress.  Abdominal: Soft. There is no tenderness.  Musculoskeletal: He exhibits no edema.  Neurological: He is alert and oriented to person, place, and time.  Skin: Skin is warm and dry.    ED Course  Procedures (including critical care time) Labs Review Labs Reviewed  BASIC METABOLIC PANEL - Abnormal; Notable for the following:    Glucose, Bld 176 (*)    All other components within normal limits  CBC - Abnormal; Notable for the following:    RBC 4.08 (*)    All  other components within normal limits  I-STAT TROPOININ, ED  Randolm Idol, ED    Imaging Review Dg Chest 2 View  01/20/2016  CLINICAL DATA:  Patient with intermittent chest pain and pressure for 3 weeks. EXAM: CHEST  2 VIEW COMPARISON:  Chest radiograph 09/22/2014 FINDINGS: Anterior cervical spinal fusion hardware. Stable cardiac and mediastinal contours. No consolidative pulmonary opacities. No pleural effusion or pneumothorax. Thoracic spine degenerative changes. IMPRESSION: No active cardiopulmonary disease. Electronically Signed   By: Lovey Newcomer M.D.   On: 01/20/2016 16:42   I have personally reviewed and evaluated these images and lab results as part of my medical decision-making.   EKG Interpretation   Date/Time:  Saturday Jan 20 2016 15:49:09 EDT Ventricular Rate:  91 PR Interval:  132 QRS Duration: 88 QT Interval:  354 QTC Calculation: 435 R  Axis:   61 Text Interpretation:  Normal sinus rhythm Normal ECG Confirmed by RAY MD,  Andee Poles [phone]) on 01/20/2016 5:41:12 PM      MDM   Final diagnoses:  None    Patient will require admission for ACS rule out.  Patient is primarily a patient at [facility]. Reports multiple caths in the past, reports that he does not have any stents. Most recent admission for ACS rule out was 09/2014. Reports that that was a normal study. Reports in the last 3 weeks he has been having increasing frequency of left sided chest pain. Today was worse. Left-sided tightness, radiates to the left side of his neck and his left upper extremity, nausea, diaphoresis. Exertional. It has been going on for the last 2 hours, improving.  Physical exam here the patient is well-appearing acute distress. Normal bowel sounds. EKG without acute ischemic changes to indicate active ACS. No interval abnormalities. Appears to be consistent with his prior EKG from last year. Chest x-ray without evidence of pneumothorax or pneumonia. No tenderness palpation of his abdomen,  doubt acute abdomen. Symptoms not consistent with aortic dissection. He is not tachypneic hypoxic or tachycardic to indicate pulmonary embolus.  Gave the patient a full dose aspirin. We'll give him sublingual nitroglycerin. We'll have her admitted for ACS rule out and stress test.  Patient amended in stable condition.    Maryan Puls, MD 01/20/16 [id number]  Pattricia Boss, MD 01/24/16 1537

## 2016-01-20 NOTE — ED Notes (Signed)
Pt had dinner tray order to be received on floor

## 2016-01-20 NOTE — H&P (Signed)
History and Physical    CASYN [name] [id number] DOB: 09/21/1960 DOA: 01/20/2016  Referring MD/NP/PA: Maryan Puls, MD PCP: Collene Mares, PA-C Outpatient Specialists:  Patient coming from: Home.  Chief Complaint: Chest pain.  HPI: Dustin [name] is a 55 y.o. male with medical history significant of anxiety, depression, GERD, ITP as a child, ex-smoker,  COPD, tuberculosis history, rheumatoid arthritis, hyperlipidemia, previous angina with multiple cardiac catheterizations in 1999, 2000 02/02/2011 with patent coronaries and no stent placement, negative nuclear stress test in January 2016 who has a strong family history of CAD, including most relatives and states that his brother died in his early 69s from massive MI who comes emergency department with complaints of on and off chest pain for the past 3 weeks.  Per patient, about 3 weeks ago, he woke up with chest pain while he was in his truck. He describes the pain as pressure-like, radiated to his left arm, associated with mild dyspnea, nausea and diaphoresis. He is states that since then he has had similar episodes of chest pain, but not as intense. However, today while he was at [facility] shopping items for renovations at his home,  he developed left-sided chest tightness, radiated to his left-sided neck and arm, associated with mild dyspnea, nausea and diaphoresis. 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  ED Course: In the ER, patient was in no acute distress and pain free when I evaluated him. His EKG was NSR with no ischemic changes, negative troponin levels and basic lab work, with the exception of glucose level that was 176 mg/dL.  Review of Systems: As per HPI otherwise 10 point review of systems negative.   Past Medical History  Diagnosis Date  . Angina     panic attacks- cardiac tests 12  . 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 GERD (gastroesophageal reflux disease)   . H/O hiatal hernia   . Anxiety   . Depression   . Tuberculosis     hx tx  . COPD (chronic obstructive pulmonary disease) ([place])   . Arthritis     RA  . Coronary artery disease     heart cath in 1999, 2005 and 2012 with patent coronary arteries    Past Surgical History  Procedure Laterality Date  . Cervical disc surgery  04  . Tonsillectomy  72  . Anterior cervical decomp/discectomy fusion  01/21/2012    Procedure: ANTERIOR CERVICAL DECOMPRESSION/DISCECTOMY FUSION 2 LEVELS;  Surgeon: Erline Levine, MD;  Location: [place] [facility];  Service: Neurosurgery;  Laterality: N/A;  Cervical Six-Seven, Cervical Seven-Thoracic One, Anterior Cervical Decompression with Fusion Interbody Prothesis Plating and Bonegraft possible posterior Cervical Seven-Thoracic One Foraminotomy  . Cardiac catheterization  [phone]    normal coronary arteries  . Hernia repair Right 60's  . Cholecystectomy N/A 10/26/2014    Procedure: LAPAROSCOPIC CHOLECYSTECTOMY;  Surgeon: Jamesetta So, MD;  Location: [facility];  Service: General;  Laterality: N/A;  . Carpal tunnel release Bilateral      reports that he quit smoking about 4 months ago. His smoking use included Cigarettes. He started smoking about 41 years ago. He has a 15 pack-year smoking history. He has never used smokeless tobacco. He reports that he does not drink alcohol or use illicit drugs.  No Known Allergies  Family History  Problem Relation  Age of Onset  . Heart disease Mother   . COPD Mother   . Heart attack Father     sudden death  . Heart attack Sister 66  . Kidney disease Sister   . Heart disease Sister   . Heart attack Brother   . Heart disease Brother   . Diabetes Brother   . Heart attack Brother   . Heart disease Brother 84    CABG  . Heart attack Brother 32   Family history reviewed with the patient.  Prior to Admission medications   Medication Sig Start Date End  Date Taking? Authorizing Provider  acetaminophen (TYLENOL) 500 MG tablet Take 500 mg by mouth every 6 (six) hours as needed for mild pain.   Yes Historical Provider, MD  ALPRAZolam Duanne Moron) 1 MG tablet Take 1 mg by mouth 3 (three) times daily as needed for anxiety.   Yes Historical Provider, MD  aspirin 81 MG tablet Take 81 mg by mouth daily.   Yes Historical Provider, MD  atorvastatin (LIPITOR) 10 MG tablet Take 1 tablet (10 mg total) by mouth daily. 09/24/14  Yes Ejiroghene E Emokpae, MD  eszopiclone (LUNESTA) 2 MG TABS tablet Take 2 mg by mouth at bedtime. Take immediately before bedtime   Yes Historical Provider, MD  folic acid (FOLVITE) 1 MG tablet Take 1 mg by mouth 2 (two) times daily.   Yes Historical Provider, MD  metFORMIN (GLUCOPHAGE) 500 MG tablet Take 500 mg by mouth 2 (two) times daily with a meal.   Yes Historical Provider, MD  methotrexate (RHEUMATREX) 2.5 MG tablet Take 25 mg by mouth once a week. Takes on Saturday. 07/02/14  Yes Historical Provider, MD  pantoprazole (PROTONIX) 40 MG tablet Take 40 mg by mouth daily.   Yes Historical Provider, MD  buPROPion (WELLBUTRIN SR) 150 MG 12 hr tablet Take 150 mg by mouth daily. Reported on 01/20/2016    Historical Provider, MD  ibuprofen (ADVIL,MOTRIN) 200 MG tablet Take 800 mg by mouth every 4 (four) hours as needed (arthritis pain). Reported on 01/20/2016    Historical Provider, MD  nicotine (NICODERM CQ - DOSED IN MG/24 HOURS) 21 mg/24hr patch Place 1 patch (21 mg total) onto the skin daily. Patient not taking: Reported on 10/24/2014 09/24/14   Ejiroghene Arlyce Dice, MD  oxyCODONE-acetaminophen (PERCOCET) 7.5-325 MG per tablet Take 1-2 tablets by mouth every 4 (four) hours as needed. Patient not taking: Reported on 01/20/2016 10/26/14   Aviva Signs, MD  zolpidem (AMBIEN) 10 MG tablet Take 10 mg by mouth at bedtime as needed for sleep. Reported on 01/20/2016    Historical Provider, MD    Physical Exam: Filed Vitals:   01/20/16 1900 01/20/16 1930  01/20/16 1945 01/20/16 2018  BP: 111/68 111/74 110/74 134/68  Pulse: 77 72 69 68  Temp:    98 F (36.7 C)  TempSrc:      Resp: 15 21 15 23   Height:    6\' 2"  (1.88 m)  Weight:    135.535 kg (298 lb 12.8 oz)  SpO2: 95% 94% 95% 98%      Constitutional: NAD, calm, comfortable Filed Vitals:   01/20/16 1900 01/20/16 1930 01/20/16 1945 01/20/16 2018  BP: 111/68 111/74 110/74 134/68  Pulse: 77 72 69 68  Temp:    98 F (36.7 C)  TempSrc:      Resp: 15 21 15 23   Height:    6\' 2"  (1.88 m)  Weight:    135.535 kg (298 lb  12.8 oz)  SpO2: 95% 94% 95% 98%   Eyes: PERRL, lids and conjunctivae normal ENMT: Mucous membranes are moist. Posterior pharynx clear of any exudate or lesions.Normal dentition.  Neck: normal, supple, no masses, no thyromegaly Respiratory: clear to auscultation bilaterally, no wheezing, no crackles. Normal respiratory effort. No accessory muscle use.  Cardiovascular: Regular rate and rhythm, no murmurs / rubs / gallops. No extremity edema. 2+ pedal pulses. No carotid bruits.  Abdomen: Obese, no tenderness, no masses palpated. No hepatosplenomegaly. Bowel sounds positive.  Musculoskeletal: no clubbing / cyanosis. No joint deformity upper and lower extremities. Good ROM, no contractures. Normal muscle tone.  Skin: no rashes, lesions, ulcers. No induration Neurologic: CN 2-12 grossly intact. Sensation intact, DTR normal. Strength 5/5 in all 4.  Psychiatric: Normal judgment and insight. Alert and oriented x 4. Normal mood.    Labs on Admission: I have personally reviewed following labs and imaging studies  CBC:  Recent Labs Lab 01/20/16 1611  WBC 7.6  HGB 13.1  HCT 40.2  MCV 98.5  PLT [id number]   Basic Metabolic Panel:  Recent Labs Lab 01/20/16 1611  NA 139  K 4.3  CL 104  CO2 25  GLUCOSE 176*  BUN 18  CREATININE 1.05  CALCIUM 9.3   GFR: Estimated Creatinine Clearance: 115 mL/min (by C-G formula based on Cr of 1.05).   Radiological Exams on  Admission: Dg Chest 2 View  01/20/2016  CLINICAL DATA:  Patient with intermittent chest pain and pressure for 3 weeks. EXAM: CHEST  2 VIEW COMPARISON:  Chest radiograph 09/22/2014 FINDINGS: Anterior cervical spinal fusion hardware. Stable cardiac and mediastinal contours. No consolidative pulmonary opacities. No pleural effusion or pneumothorax. Thoracic spine degenerative changes. IMPRESSION: No active cardiopulmonary disease. Electronically Signed   By: Lovey Newcomer M.D.   On: 01/20/2016 16:42    EKG: Independently reviewed.  Vent. rate 91 BPM PR interval 132 ms QRS duration 88 ms QT/QTc 354/435 ms P-R-T axes 40 61 39 Normal sinus rhythm Normal ECG  Assessment/Plan Principal Problem:   Chest pain Admit to telemetry/observation. Supplemental oxygen as needed. Continue aspirin, but increase to 162 mg by mouth daily Trend troponin levels. Check echo stress test in a.m. given risk factors and significant family history.  Active Problems:   Type 2 diabetes mellitus (HCC) Carbohydrate modified diet. CBG monitoring before meals. Continue metformin 500 mg by mouth twice a day    Morbid obesity [facility]) Discussed with the patient and his wife. He has started making diet changes and lifestyle modifications since he was diagnosed with diabetes.    Rheumatoid arthritis ([place]) Continue weekly methotrexate as per rheumatology. Continue folic acid supplementation. Monitor LFTs periodically.    Hyperlipidemia Continue lifestyle modifications. Continue atorvastatin 10 mg by mouth daily. Monitor LFTs periodically.    Depression with anxiety Continue bupropion 150 mg by mouth daily. Ambien for insomnia, since Lunesta is nonformulary.    GERD (gastroesophageal reflux disease) Protonix 40 mg by mouth daily.   DVT prophylaxis: Lovenox SQ. Code Status: Full code. Family Communication: His wife is present in the room. Disposition Plan: Admit for chest pain rule out. Stress test or  echocardiogram in a.m. Consults called:  Admission status: Observation/telemetry   Reubin Milan MD [facility] Pager [phone]  If 7PM-7AM, please contact night-coverage [email or web address] Password [facility]  01/20/2016, 8:53 PM

## 2016-01-20 NOTE — ED Notes (Signed)
Pt bp dropped to 99/67 after 1 SL nitro and pt stated no relief of chest pain with nitro.  MD notified.

## 2016-01-20 NOTE — ED Notes (Signed)
Pt reports left chest pain that has been intermittent and was walking around [employer] when he became diaphoretic and weak.

## 2016-01-21 DIAGNOSIS — M069 Rheumatoid arthritis, unspecified: Secondary | ICD-10-CM

## 2016-01-21 DIAGNOSIS — E119 Type 2 diabetes mellitus without complications: Secondary | ICD-10-CM

## 2016-01-21 DIAGNOSIS — E785 Hyperlipidemia, unspecified: Secondary | ICD-10-CM

## 2016-01-21 LAB — GLUCOSE, CAPILLARY
GLUCOSE-CAPILLARY: 115 mg/dL — AB (ref 65–99)
GLUCOSE-CAPILLARY: 200 mg/dL — AB (ref 65–99)
Glucose-Capillary: 113 mg/dL — ABNORMAL HIGH (ref 65–99)
Glucose-Capillary: 111 mg/dL — ABNORMAL HIGH (ref 65–99)

## 2016-01-21 LAB — TROPONIN I

## 2016-01-21 MED ORDER — INSULIN ASPART 100 UNIT/ML ~~LOC~~ SOLN
0.0000 [IU] | Freq: Three times a day (TID) | SUBCUTANEOUS | Status: DC
  Administered 2016-01-21: 4 [IU] via SUBCUTANEOUS

## 2016-01-21 NOTE — Progress Notes (Signed)
[facility] PROGRESS NOTE  MEET Dustin [name] DOB: [birth year] DOA: 01/20/2016 PCP: Collene Mares, PA-C  Assessment/Plan: Chest pain Admited to telemetry/observation. Supplemental oxygen as needed. Continue aspirin, but increase to 162 mg by mouth daily Trend troponin levels, negative 2 Check echo stress test ordered given risk factors and significant family history Patient has been seen in the past by [facility] cardiology. We'll consult them. Currently no CP   Type 2 diabetes mellitus (HCC) Carbohydrate modified diet. CBG monitoring before meals. Hold metformin SSI, obese   Morbid obesity ([employer]) Discussed with the patient and his wife, this admit He has started making diet changes and lifestyle modifications since he was diagnosed with diabetes.   Rheumatoid arthritis ([place]) Continue weekly methotrexate as per rheumatology. Continue folic acid supplementation. Monitor LFTs periodically.   Hyperlipidemia Continue lifestyle modifications. Continue atorvastatin 10 mg by mouth daily. Monitor LFTs periodically.   Depression with anxiety Continue bupropion 150 mg by mouth daily. Ambien for insomnia, since Lunesta is nonformulary.   GERD (gastroesophageal reflux disease) Protonix 40 mg by mouth daily.   DVT prophylaxis: Lovenox SQ. Code Status: Full code. Family Communication: His wife is present in the room. Disposition Plan: Admit for chest pain rule out. Stress test or echocardiogram in a.m. Consults called:  Admission status: Observation/telemetry    Consultants:  Cardio pending  Procedures:  Stress Test - pending  Antibiotics:  none (indicate start date, and stop date if known)  HPI/Subjective: Patient states is well this morning. Currently without chest pain or shortness of breath. Patient is concerned about his health given that he has a extensive family history of heart attack and death before age 109. Patient denies any headache. No  acute events overnight per nursing.  Objective: Filed Vitals:   01/21/16 0526 01/21/16 0530  BP: 89/59 117/84  Pulse: 68 63  Temp: 97.8 F (36.6 C)   Resp: 15     Intake/Output Summary (Last 24 hours) at 01/21/16 1209 Last data filed at 01/21/16 0526  Gross per 24 hour  Intake    444 ml  Output    800 ml  Net   -356 ml   Filed Weights   01/20/16 2018 01/21/16 0530  Weight: 135.535 kg (298 lb 12.8 oz) 134.945 kg (297 lb 8 oz)    Exam:   General:  No acute distress, no diaphoresis  Cardiovascular: Regular rate and rhythm, no murmurs rubs or gallops  Respiratory: Clear to auscultation bilaterally, normal work of breathing, no wheezes rales or rhonchi  Abdomen: Nondistended, obese, nontender no rebound or guarding  Musculoskeletal: Moving all extremities   Data Reviewed: Basic Metabolic Panel:  Recent Labs Lab 01/20/16 1611  NA 139  K 4.3  CL 104  CO2 25  GLUCOSE 176*  BUN 18  CREATININE 1.05  CALCIUM 9.3   Liver Function Tests: No results for input(s): AST, ALT, ALKPHOS, BILITOT, PROT, ALBUMIN in the last 168 hours. No results for input(s): LIPASE, AMYLASE in the last 168 hours. No results for input(s): AMMONIA in the last 168 hours. CBC:  Recent Labs Lab 01/20/16 1611  WBC 7.6  HGB 13.1  HCT 40.2  MCV 98.5  PLT 232   Cardiac Enzymes:  Recent Labs Lab 01/20/16 2230 01/21/16 0456  TROPONINI <0.03 <0.03   BNP (last 3 results) No results for input(s): BNP in the last 8760 hours.  ProBNP (last 3 results) No results for input(s): PROBNP in the last 8760 hours.  CBG:  Recent Labs Lab 01/20/16 2016  01/21/16 0734 01/21/16 1125  GLUCAP 95 113* 115*    No results found for this or any previous visit (from the past 240 hour(s)).   Studies: Dg Chest 2 View  01/20/2016  CLINICAL DATA:  Patient with intermittent chest pain and pressure for 3 weeks. EXAM: CHEST  2 VIEW COMPARISON:  Chest radiograph 09/22/2014 FINDINGS: Anterior cervical  spinal fusion hardware. Stable cardiac and mediastinal contours. No consolidative pulmonary opacities. No pleural effusion or pneumothorax. Thoracic spine degenerative changes. IMPRESSION: No active cardiopulmonary disease. Electronically Signed   By: Lovey Newcomer M.D.   On: 01/20/2016 16:42    Scheduled Meds: . aspirin EC  162 mg Oral Daily  . atorvastatin  10 mg Oral Daily  . enoxaparin (LOVENOX) injection  40 mg Subcutaneous Q24H  . folic acid  1 mg Oral BID  . metFORMIN  500 mg Oral BID WC  . pantoprazole  40 mg Oral Daily  . sodium chloride flush  3 mL Intravenous Q12H   Continuous Infusions:   Principal Problem:   Chest pain Active Problems:   Morbid obesity ([place])   Rheumatoid arthritis ([place])   Hyperlipidemia   Depression with anxiety   GERD (gastroesophageal reflux disease)   Type 2 diabetes mellitus ([place])    Time spent: [place] [facility] Pager [phone]. If 7PM-7AM, please contact night-coverage at [email or web address], password [facility] 01/21/2016, 12:09 PM  LOS: 1 day

## 2016-01-22 ENCOUNTER — Encounter (HOSPITAL_COMMUNITY): Payer: Self-pay | Admitting: Cardiology

## 2016-01-22 ENCOUNTER — Other Ambulatory Visit (HOSPITAL_COMMUNITY): Payer: BLUE CROSS/BLUE SHIELD

## 2016-01-22 ENCOUNTER — Encounter (HOSPITAL_COMMUNITY)
Admission: EM | Disposition: A | Discharge status: Home / Self Care | Payer: Self-pay | Source: Home / Self Care | Attending: Emergency Medicine

## 2016-01-22 DIAGNOSIS — E1122 Type 2 diabetes mellitus with diabetic chronic kidney disease: Secondary | ICD-10-CM | POA: Diagnosis not present

## 2016-01-22 DIAGNOSIS — E119 Type 2 diabetes mellitus without complications: Secondary | ICD-10-CM | POA: Diagnosis not present

## 2016-01-22 DIAGNOSIS — K210 Gastro-esophageal reflux disease with esophagitis: Secondary | ICD-10-CM | POA: Diagnosis not present

## 2016-01-22 DIAGNOSIS — E1169 Type 2 diabetes mellitus with other specified complication: Secondary | ICD-10-CM | POA: Diagnosis not present

## 2016-01-22 DIAGNOSIS — R079 Chest pain, unspecified: Secondary | ICD-10-CM | POA: Insufficient documentation

## 2016-01-22 DIAGNOSIS — R072 Precordial pain: Secondary | ICD-10-CM | POA: Diagnosis present

## 2016-01-22 DIAGNOSIS — E785 Hyperlipidemia, unspecified: Secondary | ICD-10-CM | POA: Diagnosis not present

## 2016-01-22 HISTORY — PX: CARDIAC CATHETERIZATION: SHX172

## 2016-01-22 LAB — LIPID PANEL
CHOL/HDL RATIO: 4.4 ratio
CHOLESTEROL: 109 mg/dL (ref 0–200)
HDL: 25 mg/dL — AB (ref >40)
LDL Cholesterol: 63 mg/dL (ref 0–99)
Triglycerides: 106 mg/dL (ref <150)
VLDL: 21 mg/dL (ref 0–40)

## 2016-01-22 LAB — CBC WITH DIFFERENTIAL/PLATELET
BASOS ABS: 0 10*3/uL (ref 0.0–0.1)
Basophils Relative: 0 %
EOS ABS: 0.2 10*3/uL (ref 0.0–0.7)
EOS PCT: 2 %
HCT: 40 % (ref 39.0–52.0)
Hemoglobin: 12.7 g/dL — ABNORMAL LOW (ref 13.0–17.0)
LYMPHS PCT: 26 %
Lymphs Abs: 1.8 10*3/uL (ref 0.7–4.0)
MCH: 31.3 pg (ref 26.0–34.0)
MCHC: 31.8 g/dL (ref 30.0–36.0)
MCV: 98.5 fL (ref 78.0–100.0)
Monocytes Absolute: 0.6 10*3/uL (ref 0.1–1.0)
Monocytes Relative: 9 %
NEUTROS PCT: 63 %
Neutro Abs: 4.3 10*3/uL (ref 1.7–7.7)
PLATELETS: 217 10*3/uL (ref 150–400)
RBC: 4.06 MIL/uL — AB (ref 4.22–5.81)
RDW: 13.4 % (ref 11.5–15.5)
WBC: 7 10*3/uL (ref 4.0–10.5)

## 2016-01-22 LAB — BASIC METABOLIC PANEL
ANION GAP: 9 (ref 5–15)
BUN: 18 mg/dL (ref 6–20)
CO2: 25 mmol/L (ref 22–32)
Calcium: 8.7 mg/dL — ABNORMAL LOW (ref 8.9–10.3)
Chloride: 105 mmol/L (ref 101–111)
Creatinine, Ser: 0.95 mg/dL (ref 0.61–1.24)
GFR calc Af Amer: >60 mL/min (ref >60)
Glucose, Bld: 118 mg/dL — ABNORMAL HIGH (ref 65–99)
POTASSIUM: 4.3 mmol/L (ref 3.5–5.1)
SODIUM: 139 mmol/L (ref 135–145)

## 2016-01-22 LAB — GLUCOSE, CAPILLARY
GLUCOSE-CAPILLARY: 109 mg/dL — AB (ref 65–99)
GLUCOSE-CAPILLARY: 111 mg/dL — AB (ref 65–99)
GLUCOSE-CAPILLARY: 261 mg/dL — AB (ref 65–99)
Glucose-Capillary: 64 mg/dL — ABNORMAL LOW (ref 65–99)
Glucose-Capillary: 175 mg/dL — ABNORMAL HIGH (ref 65–99)

## 2016-01-22 LAB — PROTIME-INR
INR: 1.11 (ref 0.00–1.49)
Prothrombin Time: 14.5 seconds (ref 11.6–15.2)

## 2016-01-22 LAB — D-DIMER, QUANTITATIVE: D-Dimer, Quant: 0.9 ug/mL-FEU — ABNORMAL HIGH (ref 0.00–0.50)

## 2016-01-22 SURGERY — LEFT HEART CATH AND CORONARY ANGIOGRAPHY

## 2016-01-22 MED ORDER — SODIUM CHLORIDE 0.9% FLUSH: 3.0000 mL | Freq: Two times a day (BID) | INTRAVENOUS | Status: DC

## 2016-01-22 MED ORDER — SODIUM CHLORIDE 0.9 % IV SOLN: 250.0000 mL | INTRAVENOUS | Status: DC | PRN

## 2016-01-22 MED ORDER — VERAPAMIL HCL 2.5 MG/ML IV SOLN
INTRAVENOUS | Status: AC
  Filled 2016-01-22: qty 2

## 2016-01-22 MED ORDER — LIDOCAINE HCL (PF) 1 % IJ SOLN
INTRAMUSCULAR | Status: DC | PRN
  Administered 2016-01-22: 2 mL via INTRADERMAL

## 2016-01-22 MED ORDER — VERAPAMIL HCL 2.5 MG/ML IV SOLN
INTRAVENOUS | Status: DC | PRN
  Administered 2016-01-22: 10 mL via INTRA_ARTERIAL

## 2016-01-22 MED ORDER — SODIUM CHLORIDE 0.9 % IV SOLN
INTRAVENOUS | Status: DC
  Administered 2016-01-22: 100 mL/h via INTRAVENOUS

## 2016-01-22 MED ORDER — HEPARIN (PORCINE) IN NACL 2-0.9 UNIT/ML-% IJ SOLN
INTRAMUSCULAR | Status: DC | PRN
  Administered 2016-01-22: 1000 mL

## 2016-01-22 MED ORDER — IOPAMIDOL (ISOVUE-370) INJECTION 76%
INTRAVENOUS | Status: AC
  Filled 2016-01-22: qty 100

## 2016-01-22 MED ORDER — MIDAZOLAM HCL 2 MG/2ML IJ SOLN
INTRAMUSCULAR | Status: AC
  Filled 2016-01-22: qty 2

## 2016-01-22 MED ORDER — HEPARIN SODIUM (PORCINE) 1000 UNIT/ML IJ SOLN
INTRAMUSCULAR | Status: AC
  Filled 2016-01-22: qty 1

## 2016-01-22 MED ORDER — LIDOCAINE HCL (PF) 1 % IJ SOLN
INTRAMUSCULAR | Status: AC
  Filled 2016-01-22: qty 30

## 2016-01-22 MED ORDER — MIDAZOLAM HCL 2 MG/2ML IJ SOLN
INTRAMUSCULAR | Status: DC | PRN
  Administered 2016-01-22: 1 mg via INTRAVENOUS
  Administered 2016-01-22: 2 mg via INTRAVENOUS

## 2016-01-22 MED ORDER — SODIUM CHLORIDE 0.9 % IV SOLN: INTRAVENOUS | Status: AC

## 2016-01-22 MED ORDER — SODIUM CHLORIDE 0.9% FLUSH: 3.0000 mL | INTRAVENOUS | Status: DC | PRN

## 2016-01-22 MED ORDER — IOPAMIDOL (ISOVUE-370) INJECTION 76%
INTRAVENOUS | Status: DC | PRN
  Administered 2016-01-22: 100 mL via INTRA_ARTERIAL

## 2016-01-22 MED ORDER — HEPARIN SODIUM (PORCINE) 1000 UNIT/ML IJ SOLN
INTRAMUSCULAR | Status: DC | PRN
  Administered 2016-01-22: 6000 [IU] via INTRAVENOUS

## 2016-01-22 MED ORDER — HEPARIN (PORCINE) IN NACL 2-0.9 UNIT/ML-% IJ SOLN
INTRAMUSCULAR | Status: AC
  Filled 2016-01-22: qty 1000

## 2016-01-22 MED ORDER — FENTANYL CITRATE (PF) 100 MCG/2ML IJ SOLN
INTRAMUSCULAR | Status: AC
  Filled 2016-01-22: qty 2

## 2016-01-22 MED ORDER — ASPIRIN 81 MG PO CHEW
81.0000 mg | CHEWABLE_TABLET | ORAL | Status: AC
  Administered 2016-01-22: 81 mg via ORAL

## 2016-01-22 MED ORDER — SODIUM CHLORIDE 0.9% FLUSH
3.0000 mL | Freq: Two times a day (BID) | INTRAVENOUS | Status: DC
  Administered 2016-01-22: 3 mL via INTRAVENOUS

## 2016-01-22 MED ORDER — FENTANYL CITRATE (PF) 100 MCG/2ML IJ SOLN
INTRAMUSCULAR | Status: DC | PRN
  Administered 2016-01-22: 50 ug via INTRAVENOUS
  Administered 2016-01-22: 25 ug via INTRAVENOUS

## 2016-01-22 SURGICAL SUPPLY — 12 items
CATH INFINITI 5 FR AR1 MOD (CATHETERS) ×1 IMPLANT
CATH INFINITI 5 FR JL3.5 (CATHETERS) ×1 IMPLANT
CATH INFINITI 5FR ANG PIGTAIL (CATHETERS) ×1 IMPLANT
CATH INFINITI JR4 5F (CATHETERS) ×1 IMPLANT
DEVICE RAD TR BAND REGULAR (VASCULAR PRODUCTS) ×1 IMPLANT
GLIDESHEATH SLEND SS 6F .021 (SHEATH) ×1 IMPLANT
KIT HEART LEFT (KITS) ×2 IMPLANT
PACK CARDIAC CATHETERIZATION (CUSTOM PROCEDURE TRAY) ×2 IMPLANT
SYR MEDRAD MARK V 150ML (SYRINGE) ×2 IMPLANT
TRANSDUCER W/STOPCOCK (MISCELLANEOUS) ×2 IMPLANT
TUBING CIL FLEX 10 FLL-RA (TUBING) ×2 IMPLANT
WIRE SAFE-T 1.5MM-J .035X260CM (WIRE) ×1 IMPLANT

## 2016-01-22 NOTE — Progress Notes (Addendum)
CARDIOLOGY CONSULT NOTE   Patient ID: Dustin [name] MRN: [id number] DOB/AGE: Jan 01, 1961 55 y.o.  Admit date: 01/20/2016  Primary Physician   Collene Mares, PA-C Primary Cardiologist: New Requesting MD: Dr. Benjaman Lobe Reason for Consultation: chest pain   HPI: [name] is a 55 year old male with a past medical history of DM, GERD, depression, rheumatoid arthritis and COPD. He has had left heart catheterizations in the past in 1999, 2005, and 2012 that all showed no obstructive CAD. He has a strong family history of CAD. His father died of sudden cardiac death when he was 12.    Patient presented to the ED on 01/20/16 with complaints of on and off chest pain for 3 weeks. He describes the pain as a steady ache that radiates to his left arm and jaw. He does feel like his breathing is labored when it occurs.  He does endorse some nausea and diaphoresis when pain occurs. 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  The worst episode of chest pain occurred when he was sleeping in the cabin of his truck and he was awakened with the same achy substernal chest pain that radiated to his left arm and jaw.  He was nauseous and felt SOB.  He was at [facility] a few days ago and was walking around, when the same chest pain occurred and he had to sit down.    Patient has a strong family history of CAD and sudden cardiac death.  His father died of MI when he was 105, his younger brother died of MI when he was 41. He also has 2 sisters who have died from MI's.  His older brother has had 4 MI's and CABG (still alive).  Patient has risk factors of obesity, former smoker, DM, and hyperlipidemia.   His troponins have been negative x 2.  EKG shows ST depression in V1-V6, which is also noted on previous EKG.    He had an exercise stress test in January 2016, which was low risk.    Past Medical History  Diagnosis Date  . Angina     panic attacks- cardiac tests 10  . 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 GERD (gastroesophageal reflux disease)   . H/O hiatal hernia   . Anxiety   . Depression   . Tuberculosis     hx tx  . COPD (chronic obstructive pulmonary disease) ([place])   . Arthritis     RA  . Coronary artery disease     heart cath in 1999, 2005 and 2012 with patent coronary arteries     Past Surgical History  Procedure Laterality Date  . Cervical disc surgery  2  . Tonsillectomy  72  . Anterior cervical decomp/discectomy fusion  01/21/2012    Procedure: ANTERIOR CERVICAL DECOMPRESSION/DISCECTOMY FUSION 2 LEVELS;  Surgeon: Erline Levine, MD;  Location: [place] [facility];  Service: Neurosurgery;  Laterality: N/A;  Cervical Six-Seven, Cervical Seven-Thoracic One, Anterior Cervical Decompression with Fusion Interbody Prothesis Plating and Bonegraft possible posterior Cervical Seven-Thoracic One Foraminotomy  . Cardiac catheterization  [phone]    normal coronary arteries  . Hernia repair Right 60's  . Cholecystectomy N/A 10/26/2014    Procedure: LAPAROSCOPIC CHOLECYSTECTOMY;  Surgeon: Jamesetta So, MD;  Location: [facility];  Service: General;  Laterality: N/A;  . Carpal tunnel release Bilateral   . Left elbow bursa removed Left  No Known Allergies  I have reviewed the patient's current medications . aspirin EC  162 mg Oral Daily  . atorvastatin  10 mg Oral Daily  . enoxaparin (LOVENOX) injection  40 mg Subcutaneous Q24H  . folic acid  1 mg Oral BID  . insulin aspart  0-20 Units Subcutaneous TID WC  . pantoprazole  40 mg Oral Daily  . sodium chloride flush  3 mL Intravenous Q12H     acetaminophen, ALPRAZolam, ibuprofen, nitroGLYCERIN, ondansetron (ZOFRAN) IV, zolpidem  Prior to Admission medications   Medication Sig Start Date End Date Taking? Authorizing Provider  acetaminophen (TYLENOL) 500 MG tablet Take 500 mg by mouth every 6 (six) hours as needed for mild pain.   Yes Historical Provider, MD  ALPRAZolam Duanne Moron) 1 MG tablet Take 1 mg by mouth 3  (three) times daily as needed for anxiety.   Yes Historical Provider, MD  aspirin 81 MG tablet Take 81 mg by mouth daily.   Yes Historical Provider, MD  atorvastatin (LIPITOR) 10 MG tablet Take 1 tablet (10 mg total) by mouth daily. 09/24/14  Yes Ejiroghene E Emokpae, MD  eszopiclone (LUNESTA) 2 MG TABS tablet Take 2 mg by mouth at bedtime. Take immediately before bedtime   Yes Historical Provider, MD  folic acid (FOLVITE) 1 MG tablet Take 1 mg by mouth 2 (two) times daily.   Yes Historical Provider, MD  metFORMIN (GLUCOPHAGE) 500 MG tablet Take 500 mg by mouth 2 (two) times daily with a meal.   Yes Historical Provider, MD  methotrexate (RHEUMATREX) 2.5 MG tablet Take 25 mg by mouth once a week. Takes on Saturday. 07/02/14  Yes Historical Provider, MD  pantoprazole (PROTONIX) 40 MG tablet Take 40 mg by mouth daily.   Yes Historical Provider, MD  buPROPion (WELLBUTRIN SR) 150 MG 12 hr tablet Take 150 mg by mouth daily. Reported on 01/20/2016    Historical Provider, MD  ibuprofen (ADVIL,MOTRIN) 200 MG tablet Take 800 mg by mouth every 4 (four) hours as needed (arthritis pain). Reported on 01/20/2016    Historical Provider, MD  nicotine (NICODERM CQ - DOSED IN MG/24 HOURS) 21 mg/24hr patch Place 1 patch (21 mg total) onto the skin daily. Patient not taking: Reported on 10/24/2014 09/24/14   Ejiroghene Arlyce Dice, MD  oxyCODONE-acetaminophen (PERCOCET) 7.5-325 MG per tablet Take 1-2 tablets by mouth every 4 (four) hours as needed. Patient not taking: Reported on 01/20/2016 10/26/14   Aviva Signs, MD  zolpidem (AMBIEN) 10 MG tablet Take 10 mg by mouth at bedtime as needed for sleep. Reported on 01/20/2016    Historical Provider, MD     Social History   Social History  . Marital Status: Married    Spouse Name: N/A  . Number of Children: N/A  . Years of Education: N/A   Occupational History  . Not on file.   Social History Main Topics  . Smoking status: Former Smoker -- 0.50 packs/day for 30 years    Types:  Cigarettes    Start date: 03/13/1974    Quit date: 09/10/2015  . Smokeless tobacco: Never Used  . Alcohol Use: No  . Drug Use: No  . Sexual Activity:    Partners: Female   Other Topics Concern  . Not on file   Social History Narrative    Family Status  Relation Status Death Age  . Mother Deceased 16  . Father Deceased 20  . Sister Deceased   . Brother Alive   . Sister Deceased 49  had spinabif  . Sister Other   . Brother Alive   . Brother Alive   . Brother Deceased    Family History  Problem Relation Age of Onset  . Heart disease Mother   . COPD Mother   . Heart attack Father     sudden death  . Heart attack Sister 49  . Kidney disease Sister   . Heart disease Sister   . Heart attack Brother   . Heart disease Brother   . Diabetes Brother   . Heart attack Brother   . Heart disease Brother 55    CABG  . Heart attack Brother 33     ROS:  Full 14 point review of systems complete and found to be negative unless listed above.  Physical Exam: Blood pressure 111/69, pulse 67, temperature 97.6 F (36.4 C), temperature source Oral, resp. rate 19, height 6\' 2"  (1.88 m), weight 294 lb 8 oz (133.584 kg), SpO2 96 %.  General: Well developed, well nourished, male in no acute distress Head: Eyes PERRLA, No xanthomas.   Normocephalic and atraumatic, oropharynx without edema or exudate.  Lungs: CTA Heart: HRRR S1 S2, no rub/gallop, No murmur. Pulses are 2+ extrem.   Neck: No carotid bruits. No lymphadenopathy. No JVD. Abdomen: Bowel sounds present, abdomen soft and non-tender without masses or hernias noted. Msk:  No spine or cva tenderness. No weakness, no joint deformities or effusions. Extremities: No clubbing or cyanosis.  +2 BLE edema.  Neuro: Alert and oriented X 3. No focal deficits noted. Psych:  Good affect, responds appropriately Skin: No rashes or lesions noted.  Labs:   Lab Results  Component Value Date   WBC 7.0 01/22/2016   HGB 12.7* 01/22/2016   HCT  40.0 01/22/2016   MCV 98.5 01/22/2016   PLT 217 01/22/2016    Recent Labs Lab 01/22/16 0537  NA 139  K 4.3  CL 105  CO2 25  BUN 18  CREATININE 0.95  CALCIUM 8.7*  GLUCOSE 118*    Recent Labs  01/20/16 2230 01/21/16 0456  TROPONINI <0.03 <0.03    Recent Labs  01/20/16 1646 01/20/16 1942  TROPIPOC 0.01 0.01   No results found for: BNP Lab Results  Component Value Date   CHOL 180 09/22/2014   HDL 30* 09/22/2014   LDLCALC 118* 09/22/2014   TRIG 158* 09/22/2014    Echo: Pending  ECG:  NSR, ST depression in V1-V6.   Radiology:  Dg Chest 2 View  01/20/2016  CLINICAL DATA:  Patient with intermittent chest pain and pressure for 3 weeks. EXAM: CHEST  2 VIEW COMPARISON:  Chest radiograph 09/22/2014 FINDINGS: Anterior cervical spinal fusion hardware. Stable cardiac and mediastinal contours. No consolidative pulmonary opacities. No pleural effusion or pneumothorax. Thoracic spine degenerative changes. IMPRESSION: No active cardiopulmonary disease. Electronically Signed   By: Lovey Newcomer M.D.   On: 01/20/2016 16:42    ASSESSMENT AND PLAN:    Principal Problem:   Chest pain Active Problems:   Morbid obesity ([place])   Rheumatoid arthritis ([place])   Hyperlipidemia   Depression with anxiety   GERD (gastroesophageal reflux disease)   Type 2 diabetes mellitus ([place])  1. Chest pain: Patient has chest pain at rest on and off for 3 weeks. Pain is achy, substernal with radiation to his left arm and jaw.  He does have associated SOB, nausea and diaphoresis when pain occurs. His pain is completely different from when he had his cholecystectomy.  Patient has a strong family  history of CAD and sudden cardiac death.  His father died of MI when he was 50, his younger brother died of MI when he was 68. He also has 2 sisters who have died from MI's.  His older brother has had 4 MI's and CABG (still alive).  Patient has risk factors of obesity, former smoker, DM, and hyperlipidemia. EKG shows  some ST depression, which is not new. He had a nuclear stress test 09/2015 with no ischemia, however given his family history and his description of his unstable angina, he would benefit from cardiac catheterization. Hold Metformin for cath.  Continue ASA/statin.  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  2.  Dyslipidemia - his last lipids were a year ago and LDL was 118.  With his history of DM and strong family history of premature CAD, his LDL goal would be < 100 and ultimately < 70.  Repeat FLP in am and adjust statin as needed.  3.  DM - per PCP.  Check HbA1C - last check was 09/2014 and was elevated at 7.1.  4.  GERD - he says that he has not had problems with his GERD in years and does not think his current chest discomfort is related.  Continue Protonix.    Signed: Arbutus Leas, NP 01/22/2016 7:34 AM Pager [phone]  Co-Sign MD  Patient seen with Jettie Booze, NP and independently examined by myself. We discussed all aspects of the encounter. I agree with the assessment and plan as stated above with some modifications done.  Patient has a history of cardiac cath x 3 in the past and the last one in 2012 at which time there was no significant disease.  Now with new onset CP for several weeks both at rest and with exertion with classic symptoms of radiation to neck and left arm, severe diaphoresis and nausea.  He has a strong family history of premature CAD and also has DM with elevated HbA1C a year ago and elevated LDL a year ago.  He quit smoking this past December. I have reviewed all his labs and EKGs with him and prior cath in 2012.  He had a normal stress test in January of this year and we discussed how there can be a false negative rate of 15% especially in the setting of 3 vessel CAD with balanced ischemia.  I have recommended that we proceed with left heart catheterization.  Cardiac catheterization was discussed with the patient fully.  The patient understands that risks include but are not limited to stroke (1 in 1000), death (1 in 3), kidney failure [usually temporary] (1 in 500), bleeding (1 in 200), allergic reaction [possibly serious] (1 in 200).  The patient understands and is willing to proceed.    I have spent 40 minutes examining and interviewing the patient, reviewing lab data, EKGs, chest xrays, prior cath reports and stress test results.  I have discussed at length the diagnostic options going forward and explained the cath procedure.  > 50% of time was spent in direct patient care.    Signed: Fransico Him, MD [facility] 01/22/2016

## 2016-01-22 NOTE — H&P (View-Only) (Signed)
CARDIOLOGY CONSULT NOTE   Patient ID: Dustin [name] MRN: [id number] DOB/AGE: 04/11/1961 55 y.o.  Admit date: 01/20/2016  Primary Physician   Dustin Mares, PA-C Primary Cardiologist: Dustin [name] Reason for Consultation: chest pain   HPI: Dustin [name] is a 55 year old male with a past medical history of DM, GERD, depression, rheumatoid arthritis and COPD. He has had left heart catheterizations in the past in 1999, 2005, and 2012 that all showed no obstructive CAD. He has a strong family history of CAD. His father died of sudden cardiac death when he was 13.    Patient presented to the ED on 01/20/16 with complaints of on and off chest pain for 3 weeks. He describes the pain as a steady ache that radiates to his left arm and jaw. He does feel like his breathing is labored when it occurs.  He does endorse some nausea and diaphoresis when pain occurs. 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  The worst episode of chest pain occurred when he was sleeping in the cabin of his truck and he was awakened with the same achy substernal chest pain that radiated to his left arm and jaw.  He was nauseous and felt SOB.  He was at [facility] a few days ago and was walking around, when the same chest pain occurred and he had to sit down.    Patient has a strong family history of CAD and sudden cardiac death.  His father died of MI when he was 68, his younger brother died of MI when he was 42. He also has 2 sisters who have died from MI's.  His older brother has had 4 MI's and CABG (still alive).  Patient has risk factors of obesity, former smoker, DM, and hyperlipidemia.   His troponins have been negative x 2.  EKG shows ST depression in V1-V6, which is also noted on previous EKG.    He had an exercise stress test in January 2016, which was low risk.    Past Medical History  Diagnosis Date  . Angina     panic attacks- cardiac tests 10  . 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 GERD (gastroesophageal reflux disease)   . H/O hiatal hernia   . Anxiety   . Depression   . Tuberculosis     hx tx  . COPD (chronic obstructive pulmonary disease) ([place])   . Arthritis     RA  . Coronary artery disease     heart cath in 1999, 2005 and 2012 with patent coronary arteries     Past Surgical History  Procedure Laterality Date  . Cervical disc surgery  2  . Tonsillectomy  72  . Anterior cervical decomp/discectomy fusion  01/21/2012    Procedure: ANTERIOR CERVICAL DECOMPRESSION/DISCECTOMY FUSION 2 LEVELS;  Surgeon: Erline Levine, MD;  Location: [place] [facility];  Service: Neurosurgery;  Laterality: N/A;  Cervical Six-Seven, Cervical Seven-Thoracic One, Anterior Cervical Decompression with Fusion Interbody Prothesis Plating and Bonegraft possible posterior Cervical Seven-Thoracic One Foraminotomy  . Cardiac catheterization  [phone]    normal coronary arteries  . Hernia repair Right 60's  . Cholecystectomy N/A 10/26/2014    Procedure: LAPAROSCOPIC CHOLECYSTECTOMY;  Surgeon: Jamesetta So, MD;  Location: [facility];  Service: General;  Laterality: N/A;  . Carpal tunnel release Bilateral   . Left elbow bursa removed Left  No Known Allergies  I have reviewed the patient's current medications . aspirin EC  162 mg Oral Daily  . atorvastatin  10 mg Oral Daily  . enoxaparin (LOVENOX) injection  40 mg Subcutaneous Q24H  . folic acid  1 mg Oral BID  . insulin aspart  0-20 Units Subcutaneous TID WC  . pantoprazole  40 mg Oral Daily  . sodium chloride flush  3 mL Intravenous Q12H     acetaminophen, ALPRAZolam, ibuprofen, nitroGLYCERIN, ondansetron (ZOFRAN) IV, zolpidem  Prior to Admission medications   Medication Sig Start Date End Date Taking? Authorizing Provider  acetaminophen (TYLENOL) 500 MG tablet Take 500 mg by mouth every 6 (six) hours as needed for mild pain.   Yes Historical Provider, MD  ALPRAZolam Duanne Moron) 1 MG tablet Take 1 mg by mouth 3  (three) times daily as needed for anxiety.   Yes Historical Provider, MD  aspirin 81 MG tablet Take 81 mg by mouth daily.   Yes Historical Provider, MD  atorvastatin (LIPITOR) 10 MG tablet Take 1 tablet (10 mg total) by mouth daily. 09/24/14  Yes Ejiroghene E Emokpae, MD  eszopiclone (LUNESTA) 2 MG TABS tablet Take 2 mg by mouth at bedtime. Take immediately before bedtime   Yes Historical Provider, MD  folic acid (FOLVITE) 1 MG tablet Take 1 mg by mouth 2 (two) times daily.   Yes Historical Provider, MD  metFORMIN (GLUCOPHAGE) 500 MG tablet Take 500 mg by mouth 2 (two) times daily with a meal.   Yes Historical Provider, MD  methotrexate (RHEUMATREX) 2.5 MG tablet Take 25 mg by mouth once a week. Takes on Saturday. 07/02/14  Yes Historical Provider, MD  pantoprazole (PROTONIX) 40 MG tablet Take 40 mg by mouth daily.   Yes Historical Provider, MD  buPROPion (WELLBUTRIN SR) 150 MG 12 hr tablet Take 150 mg by mouth daily. Reported on 01/20/2016    Historical Provider, MD  ibuprofen (ADVIL,MOTRIN) 200 MG tablet Take 800 mg by mouth every 4 (four) hours as needed (arthritis pain). Reported on 01/20/2016    Historical Provider, MD  nicotine (NICODERM CQ - DOSED IN MG/24 HOURS) 21 mg/24hr patch Place 1 patch (21 mg total) onto the skin daily. Patient not taking: Reported on 10/24/2014 09/24/14   Ejiroghene Arlyce Dice, MD  oxyCODONE-acetaminophen (PERCOCET) 7.5-325 MG per tablet Take 1-2 tablets by mouth every 4 (four) hours as needed. Patient not taking: Reported on 01/20/2016 10/26/14   Aviva Signs, MD  zolpidem (AMBIEN) 10 MG tablet Take 10 mg by mouth at bedtime as needed for sleep. Reported on 01/20/2016    Historical Provider, MD     Social History   Social History  . Marital Status: Married    Spouse Name: N/A  . Number of Children: N/A  . Years of Education: N/A   Occupational History  . Not on file.   Social History Main Topics  . Smoking status: Former Smoker -- 0.50 packs/day for 30 years    Types:  Cigarettes    Start date: 03/13/1974    Quit date: 09/10/2015  . Smokeless tobacco: Never Used  . Alcohol Use: No  . Drug Use: No  . Sexual Activity:    Partners: Female   Other Topics Concern  . Not on file   Social History Narrative    Family Status  Relation Status Death Age  . Mother Deceased 20  . Father Deceased 16  . Sister Deceased   . Brother Alive   . Sister Deceased 26  had spinabif  . Sister Other   . Brother Alive   . Brother Alive   . Brother Deceased    Family History  Problem Relation Age of Onset  . Heart disease Mother   . COPD Mother   . Heart attack Father     sudden death  . Heart attack Sister 25  . Kidney disease Sister   . Heart disease Sister   . Heart attack Brother   . Heart disease Brother   . Diabetes Brother   . Heart attack Brother   . Heart disease Brother 33    CABG  . Heart attack Brother 33     ROS:  Full 14 point review of systems complete and found to be negative unless listed above.  Physical Exam: Blood pressure 111/69, pulse 67, temperature 97.6 F (36.4 C), temperature source Oral, resp. rate 19, height 6\' 2"  (1.88 m), weight 294 lb 8 oz (133.584 kg), SpO2 96 %.  General: Well developed, well nourished, male in no acute distress Head: Eyes PERRLA, No xanthomas.   Normocephalic and atraumatic, oropharynx without edema or exudate.  Lungs: CTA Heart: HRRR S1 S2, no rub/gallop, No murmur. Pulses are 2+ extrem.   Neck: No carotid bruits. No lymphadenopathy. No JVD. Abdomen: Bowel sounds present, abdomen soft and non-tender without masses or hernias noted. Msk:  No spine or cva tenderness. No weakness, no joint deformities or effusions. Extremities: No clubbing or cyanosis.  +2 BLE edema.  Neuro: Alert and oriented X 3. No focal deficits noted. Psych:  Good affect, responds appropriately Skin: No rashes or lesions noted.  Labs:   Lab Results  Component Value Date   WBC 7.0 01/22/2016   HGB 12.7* 01/22/2016   HCT  40.0 01/22/2016   MCV 98.5 01/22/2016   PLT 217 01/22/2016    Recent Labs Lab 01/22/16 0537  NA 139  K 4.3  CL 105  CO2 25  BUN 18  CREATININE 0.95  CALCIUM 8.7*  GLUCOSE 118*    Recent Labs  01/20/16 2230 01/21/16 0456  TROPONINI <0.03 <0.03    Recent Labs  01/20/16 1646 01/20/16 1942  TROPIPOC 0.01 0.01   No results found for: BNP Lab Results  Component Value Date   CHOL 180 09/22/2014   HDL 30* 09/22/2014   LDLCALC 118* 09/22/2014   TRIG 158* 09/22/2014    Echo: Pending  ECG:  NSR, ST depression in V1-V6.   Radiology:  Dg Chest 2 View  01/20/2016  CLINICAL DATA:  Patient with intermittent chest pain and pressure for 3 weeks. EXAM: CHEST  2 VIEW COMPARISON:  Chest radiograph 09/22/2014 FINDINGS: Anterior cervical spinal fusion hardware. Stable cardiac and mediastinal contours. No consolidative pulmonary opacities. No pleural effusion or pneumothorax. Thoracic spine degenerative changes. IMPRESSION: No active cardiopulmonary disease. Electronically Signed   By: Lovey Newcomer M.D.   On: 01/20/2016 16:42    ASSESSMENT AND PLAN:    Principal Problem:   Chest pain Active Problems:   Morbid obesity ([place])   Rheumatoid arthritis ([place])   Hyperlipidemia   Depression with anxiety   GERD (gastroesophageal reflux disease)   Type 2 diabetes mellitus ([place])  1. Chest pain: Patient has chest pain at rest on and off for 3 weeks. Pain is achy, substernal with radiation to his left arm and jaw.  He does have associated SOB, nausea and diaphoresis when pain occurs. His pain is completely different from when he had his cholecystectomy.  Patient has a strong family  history of CAD and sudden cardiac death.  His father died of MI when he was 47, his younger brother died of MI when he was 24. He also has 2 sisters who have died from MI's.  His older brother has had 4 MI's and CABG (still alive).  Patient has risk factors of obesity, former smoker, DM, and hyperlipidemia. EKG shows  some ST depression, which is not Dustin. He had a nuclear stress test 09/2015 with no ischemia, however given his family history and his description of his unstable angina, he would benefit from cardiac catheterization. Hold Metformin for cath.  Continue ASA/statin.  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  2.  Dyslipidemia - his last lipids were a year ago and LDL was 118.  With his history of DM and strong family history of premature CAD, his LDL goal would be < 100 and ultimately < 70.  Repeat FLP in am and adjust statin as needed.  3.  DM - per PCP.  Check HbA1C - last check was 09/2014 and was elevated at 7.1.  4.  GERD - he says that he has not had problems with his GERD in years and does not think his current chest discomfort is related.  Continue Protonix.    Signed: Arbutus Leas, NP 01/22/2016 7:34 AM Pager [phone]  Co-Sign MD  Patient seen with Jettie Booze, NP and independently examined by myself. We discussed all aspects of the encounter. I agree with the assessment and plan as stated above with some modifications done.  Patient has a history of cardiac cath x 3 in the past and the last one in 2012 at which time there was no significant disease.  Now with Dustin onset CP for several weeks both at rest and with exertion with classic symptoms of radiation to neck and left arm, severe diaphoresis and nausea.  He has a strong family history of premature CAD and also has DM with elevated HbA1C a year ago and elevated LDL a year ago.  He quit smoking this past December. I have reviewed all his labs and EKGs with him and prior cath in 2012.  He had a normal stress test in January of this year and we discussed how there can be a false negative rate of 15% especially in the setting of 3 vessel CAD with balanced ischemia.  I have recommended that we proceed with left heart catheterization.  Cardiac catheterization was discussed with the patient fully.  The patient understands that risks include but are not limited to stroke (1 in 1000), death (1 in 86), kidney failure [usually temporary] (1 in 500), bleeding (1 in 200), allergic reaction [possibly serious] (1 in 200).  The patient understands and is willing to proceed.    I have spent 40 minutes examining and interviewing the patient, reviewing lab data, EKGs, chest xrays, prior cath reports and stress test results.  I have discussed at length the diagnostic options going forward and explained the cath procedure.  > 50% of time was spent in direct patient care.    Signed: Fransico Him, MD [facility] 01/22/2016

## 2016-01-22 NOTE — Progress Notes (Addendum)
Patient ID: Dustin [name], male   DOB: 1960/10/28, 55 y.o.   MRN: [id number]  PROGRESS NOTE    FREDIE RANARD  [id number] DOB: [birth year] DOA: 01/20/2016  PCP: Collene Mares, PA-C  Outpatient Specialists: Cardiology, Jory Sims NP  Brief Narrative:  55 year old male with a past medical history of DM, GERD, depression, rheumatoid arthritis and COPD, had left heart catheterizations in the past in 1999, 2005, and 2012 that all showed no obstructive CAD. Pt has a strong family history of CAD. His father died of sudden cardiac death when he was 20 and his younger brother died of MI at the age of 78. He is to sisters also died of MI. His older brother had 4 MIs and CABG and he is still alive.  Patient presented to the ED on 01/20/16 with intermittent chest pain for 3 weeks. Patient reported having ongoing chest pain and the worst pain was when he was sleeping in the cabin of his truck and this chest pain awoke him from sleep. Patient described midsternal chest pain, radiating to left arm and jaw. Patient reported associated nausea and diaphoresis. He again had the similar type of pain few days prior to this admission with walking.  Patient was admitted for further evaluation of chest pain. Cardiology has seen him in consultation. Plan is for cardiac catheterization 01/22/2016. Please note patient's risk factor include obesity, former smoker, diabetes and dyslipidemia. On admission, troponins were negative. The 12-lead EKG showed ST depression in V1 through V6 which was also noted on previous EKG.   Assessment & Plan:   Principal Problem:   Chest pain - Rule out ACS. - The 12-lead EKG showed ST segment depression in V1 through V6 - Troponins are negative so far - Appreciate cardiology following - Plan for cardiac catheterization today - Continue daily aspirin and statin therapy  Active Problems:   Morbid obesity ([place]) - Counseled on diet - Body mass index is 37.8 kg/(m^2).   Dyslipidemia associated with type 2 diabetes mellitus - Continue statin at bedtime  - Check lipid panel    Type 2 diabetes mellitus without complications without long-term insulin use - At home patient takes metformin but currently he is on sliding scale insulin because he is nothing by mouth for cardiac catheterization - His CBGs are 111, 109, at goal - Check A1c   DVT prophylaxis: Lovenox subcutaneous Code Status: full code  Family Communication: No family at the bedside this morning Disposition Plan: Home once cleared by cardiology   Consultants:   Cardiology, Dr. Fransico Him  Procedures:   Cardiac cath 01/22/2016  Antimicrobials:   None    Subjective: No chest pain at this time.   Objective: Filed Vitals:   01/21/16 1429 01/21/16 2009 01/22/16 0614 01/22/16 0800  BP: 113/61 118/71 111/69   Pulse: 61 67    Temp: 97.7 F (36.5 C) 97.6 F (36.4 C) 97.6 F (36.4 C)   TempSrc: Oral Oral Oral   Resp: 16 19  18   Height:      Weight:   133.584 kg (294 lb 8 oz)   SpO2: 97% 99% 96% 98%    Intake/Output Summary (Last 24 hours) at 01/22/16 1208 Last data filed at 01/22/16 0900  Gross per 24 hour  Intake    480 ml  Output   1100 ml  Net   -620 ml   Filed Weights   01/20/16 2018 01/21/16 0530 01/22/16 0614  Weight: 135.535 kg (298 lb 12.8 oz)  134.945 kg (297 lb 8 oz) 133.584 kg (294 lb 8 oz)    Examination:  General exam: Appears calm and comfortable  Respiratory system: Clear to auscultation. Respiratory effort normal. Cardiovascular system: S1 & S2 heard, RRR. No JVD Gastrointestinal system: Abdomen is nondistended, soft and nontender. No organomegaly or masses felt. Normal bowel sounds heard. Central nervous system: Alert and oriented. No focal neurological deficits. Extremities: Symmetric 5 x 5 power. Skin: No rashes, lesions or ulcers Psychiatry: Judgement and insight appear normal. Mood & affect appropriate.   Data Reviewed: I have personally reviewed  following labs and imaging studies  CBC:  Recent Labs Lab 01/20/16 1611 01/22/16 0537  WBC 7.6 7.0  NEUTROABS  --  4.3  HGB 13.1 12.7*  HCT 40.2 40.0  MCV 98.5 98.5  PLT 232 [id number]   Basic Metabolic Panel:  Recent Labs Lab 01/20/16 1611 01/22/16 0537  NA 139 139  K 4.3 4.3  CL 104 105  CO2 25 25  GLUCOSE 176* 118*  BUN 18 18  CREATININE 1.05 0.95  CALCIUM 9.3 8.7*   GFR: Estimated Creatinine Clearance: 126.2 mL/min (by C-G formula based on Cr of 0.95). Liver Function Tests: No results for input(s): AST, ALT, ALKPHOS, BILITOT, PROT, ALBUMIN in the last 168 hours. No results for input(s): LIPASE, AMYLASE in the last 168 hours. No results for input(s): AMMONIA in the last 168 hours. Coagulation Profile:  Recent Labs Lab 01/22/16 0950  INR 1.11   Cardiac Enzymes:  Recent Labs Lab 01/20/16 2230 01/21/16 0456  TROPONINI <0.03 <0.03   BNP (last 3 results) No results for input(s): PROBNP in the last 8760 hours. HbA1C: No results for input(s): HGBA1C in the last 72 hours. CBG:  Recent Labs Lab 01/21/16 1125 01/21/16 1633 01/21/16 2105 01/22/16 0720 01/22/16 1140  GLUCAP 115* 200* 111* 109* 111*   Lipid Profile: No results for input(s): CHOL, HDL, LDLCALC, TRIG, CHOLHDL, LDLDIRECT in the last 72 hours. Thyroid Function Tests: No results for input(s): TSH, T4TOTAL, FREET4, T3FREE, THYROIDAB in the last 72 hours. Anemia Panel: No results for input(s): VITAMINB12, FOLATE, FERRITIN, TIBC, IRON, RETICCTPCT in the last 72 hours. Urine analysis: No results found for: COLORURINE, APPEARANCEUR, LABSPEC, PHURINE, GLUCOSEU, HGBUR, BILIRUBINUR, KETONESUR, PROTEINUR, UROBILINOGEN, NITRITE, LEUKOCYTESUR Sepsis Labs: @LABRCNTIP (procalcitonin:4,lacticidven:4)   )No results found for this or any previous visit (from the past 240 hour(s)).    Radiology Studies: Dg Chest 2 View 01/20/2016  No active cardiopulmonary disease.    Scheduled Meds: . aspirin EC  162 mg  Oral Daily  . atorvastatin  10 mg Oral Daily  . enoxaparin (LOVENOX) injection  40 mg Subcutaneous Q24H  . folic acid  1 mg Oral BID  . insulin aspart  0-20 Units Subcutaneous TID WC  . pantoprazole  40 mg Oral Daily  . sodium chloride flush  3 mL Intravenous Q12H  . sodium chloride flush  3 mL Intravenous Q12H   Continuous Infusions: . sodium chloride 100 mL/hr (01/22/16 1053)     LOS: 2 days    Time spent: 25 minutes    Leisa Lenz, MD [facility] Pager [phone]  If 7PM-7AM, please contact night-coverage [email or web address] Password [email or web address] 01/22/2016, 12:08 PM

## 2016-01-22 NOTE — Interval H&P Note (Signed)
History and Physical Interval Note:  01/22/2016 2:37 PM  Dustin [name]  has presented today for cardiac cath with the diagnosis of unstable angina. The various methods of treatment have been discussed with the patient and family. After consideration of risks, benefits and other options for treatment, the patient has consented to  Procedure(s): Left Heart Cath and Coronary Angiography (N/A) as a surgical intervention .  The patient's history has been reviewed, patient examined, no change in status, stable for surgery.  I have reviewed the patient's chart and labs.  Questions were answered to the patient's satisfaction.     MCALHANY,CHRISTOPHER

## 2016-01-22 NOTE — Progress Notes (Signed)
Cardiac cath with normal coronary arteries and normal LVF.  Would recommend checking VQ scan rule out PE and if negative then ok to d/c home from cardiac standpoint.

## 2016-01-23 ENCOUNTER — Other Ambulatory Visit (HOSPITAL_COMMUNITY): Payer: BLUE CROSS/BLUE SHIELD

## 2016-01-23 ENCOUNTER — Observation Stay (HOSPITAL_COMMUNITY): Payer: BLUE CROSS/BLUE SHIELD

## 2016-01-23 ENCOUNTER — Encounter (HOSPITAL_COMMUNITY): Payer: Self-pay | Admitting: Cardiovascular Disease

## 2016-01-23 DIAGNOSIS — E1169 Type 2 diabetes mellitus with other specified complication: Secondary | ICD-10-CM

## 2016-01-23 DIAGNOSIS — E119 Type 2 diabetes mellitus without complications: Secondary | ICD-10-CM | POA: Diagnosis not present

## 2016-01-23 DIAGNOSIS — R791 Abnormal coagulation profile: Secondary | ICD-10-CM | POA: Diagnosis not present

## 2016-01-23 DIAGNOSIS — R079 Chest pain, unspecified: Secondary | ICD-10-CM | POA: Diagnosis not present

## 2016-01-23 DIAGNOSIS — Z452 Encounter for adjustment and management of vascular access device: Secondary | ICD-10-CM | POA: Diagnosis not present

## 2016-01-23 LAB — HEMOGLOBIN A1C
Hgb A1c MFr Bld: 7.1 % — ABNORMAL HIGH (ref 4.8–5.6)
Mean Plasma Glucose: 157 mg/dL

## 2016-01-23 LAB — GLUCOSE, CAPILLARY
GLUCOSE-CAPILLARY: 99 mg/dL (ref 65–99)
GLUCOSE-CAPILLARY: 154 mg/dL — AB (ref 65–99)
GLUCOSE-CAPILLARY: 78 mg/dL (ref 65–99)

## 2016-01-23 MED ORDER — TECHNETIUM TC 99M DIETHYLENETRIAME-PENTAACETIC ACID: 31.2000 | Freq: Once | INTRAVENOUS | Status: DC | PRN

## 2016-01-23 MED ORDER — TECHNETIUM TO 99M ALBUMIN AGGREGATED
4.1000 | Freq: Once | INTRAVENOUS | Status: AC | PRN
  Administered 2016-01-23: 4 via INTRAVENOUS

## 2016-01-23 MED FILL — Verapamil HCl IV Soln 2.5 MG/ML: INTRAVENOUS | Qty: 2 | Status: AC

## 2016-01-23 NOTE — Discharge Instructions (Addendum)
Metformin extended-release tablets What is this medicine? METFORMIN (met FOR min) is used to treat type 2 diabetes. It helps to control blood sugar. Treatment is combined with diet and exercise. This medicine can be used alone or with other medicines for diabetes. This medicine may be used for other purposes; ask your health care provider or pharmacist if you have questions. What should I tell my health care provider before I take this medicine? They need to know if you have any of these conditions: -anemia -frequently drink alcohol-containing beverages -become easily dehydrated -heart attack -heart failure -kidney disease -liver disease -polycystic ovary syndrome -serious infection or injury -vomiting -an unusual or allergic reaction to metformin, other medicines, foods, dyes, or preservatives -pregnant or trying to get pregnant -breast-feeding How should I use this medicine? Take this medicine by mouth with a glass of water. Take it with meals. Swallow whole, do not crush or chew. Follow the directions on the prescription label. Take your medicine at regular intervals. Do not take your medicine more often than directed. Talk to your pediatrician regarding the use of this medicine in children. Special care may be needed. Overdosage: If you think you have taken too much of this medicine contact a poison control center or emergency room at once. NOTE: This medicine is only for you. Do not share this medicine with others. What if I miss a dose? If you miss a dose, take it as soon as you can. If it is almost time for your next dose, take only that dose. Do not take double or extra doses. What may interact with this medicine? Do not take this medicine with any of the following medications: -dofetilide -gatifloxacin -certain contrast medicines given before X-rays, CT scans, MRI, or other procedures This medicine may also interact with the following medications: -acetazolamide -certain  medicines for HIV infection or hepatitis, like adefovir, emtricitabine, entecavir, lamivudine, or tenofovir -cimetidine -crizotinib -digoxin -diuretics -male hormones, like estrogens or progestins and birth control pills -glycopyrrolate -isoniazid -lamotrigine -medicines for blood pressure, heart disease, irregular heart beat -memantine -midodrine -methazolamide -morphine -nicotinic acid -phenothiazines like chlorpromazine, mesoridazine, prochlorperazine, thioridazine -phenytoin -procainamide -propantheline -quinidine -quinine -ranitidine -ranolazine -steroid medicines like prednisone or cortisone -stimulant medicines for attention disorders, weight loss, or to stay awake -thyroid medicines -topiramate -trimethoprim -trospium -vancomycin -vandetanib -zonisamide This list may not describe all possible interactions. Give your health care provider a list of all the medicines, herbs, non-prescription drugs, or dietary supplements you use. Also tell them if you smoke, drink alcohol, or use illegal drugs. Some items may interact with your medicine. What should I watch for while using this medicine? Visit your doctor or health care professional for regular checks on your progress. A test called the HbA1C (A1C) will be monitored. This is a simple blood test. It measures your blood sugar control over the last 2 to 3 months. You will receive this test every 3 to 6 months. Learn how to check your blood sugar. Learn the symptoms of low and high blood sugar and how to manage them. Always carry a quick-source of sugar with you in case you have symptoms of low blood sugar. Examples include hard sugar candy or glucose tablets. Make sure others know that you can choke if you eat or drink when you develop serious symptoms of low blood sugar, such as seizures or unconsciousness. They must get medical help at once. Tell your doctor or health care professional if you have high blood sugar. You  might need to  change the dose of your medicine. If you are sick or exercising more than usual, you might need to change the dose of your medicine. Do not skip meals. Ask your doctor or health care professional if you should avoid alcohol. Many nonprescription cough and cold products contain sugar or alcohol. These can affect blood sugar. This medicine may cause ovulation in premenopausal women who do not have regular monthly periods. This may increase your chances of becoming pregnant. You should not take this medicine if you become pregnant or think you may be pregnant. Talk with your doctor or health care professional about your birth control options while taking this medicine. Contact your doctor or health care professional right away if think you are pregnant. The tablet shell for some brands of this medicine does not dissolve. This is normal. The tablet shell may appear whole in the stool. This is not a cause for concern. If you are going to need surgery, a MRI, CT scan, or other procedure, tell your doctor that you are taking this medicine. You may need to stop taking this medicine before the procedure. Wear a medical ID bracelet or chain, and carry a card that describes your disease and details of your medicine and dosage times. What side effects may I notice from receiving this medicine? Side effects that you should report to your doctor or health care professional as soon as possible: -allergic reactions like skin rash, itching or hives, swelling of the face, lips, or tongue -breathing problems -feeling faint or lightheaded, falls -muscle aches or pains -signs and symptoms of low blood sugar such as feeling anxious, confusion, dizziness, increased hunger, unusually weak or tired, sweating, shakiness, cold, irritable, headache, blurred vision, fast heartbeat, loss of consciousness -slow or irregular heartbeat -unusual stomach pain or discomfort -unusually tired or weak Side effects that  usually do not require medical attention (report to your doctor or health care professional if they continue or are bothersome): -diarrhea -headache -heartburn -metallic taste in mouth -nausea -stomach gas, upset This list may not describe all possible side effects. Call your doctor for medical advice about side effects. You may report side effects to FDA at 1-800-FDA-1088. Where should I keep my medicine? Keep out of the reach of children. Store at room temperature between 15 and 30 degrees C (59 and 86 degrees F). Protect from light. Throw away any unused medicine after the expiration date. NOTE: This sheet is a summary. It may not cover all possible information. If you have questions about this medicine, talk to your doctor, pharmacist, or health care provider.    2016, [employer]vier/[employer]. (2014-02-15 22:12:16) Methotrexate tablets What is this medicine? METHOTREXATE (METH oh TREX ate) is a chemotherapy drug used to treat cancer including breast cancer, leukemia, and lymphoma. This medicine can also be used to treat psoriasis and certain kinds of arthritis. This medicine may be used for other purposes; ask your health care provider or pharmacist if you have questions. What should I tell my health care provider before I take this medicine? They need to know if you have any of these conditions: -fluid in the stomach area or lungs -if you often drink alcohol -infection or immune system problems -kidney disease or on hemodialysis -liver disease -low blood counts, like low white cell, platelet, or red cell counts -lung disease -radiation therapy -stomach ulcers -ulcerative colitis -an unusual or allergic reaction to methotrexate, other medicines, foods, dyes, or preservatives -pregnant or trying to get pregnant -breast-feeding How should I use  this medicine? Take this medicine by mouth with a glass of water. Follow the directions on the prescription label. Take your medicine at  regular intervals. Do not take it more often than directed. Do not stop taking except on your doctor's advice. Make sure you know why you are taking this medicine and how often you should take it. If this medicine is used for a condition that is not cancer, like arthritis or psoriasis, it should be taken weekly, NOT daily. Taking this medicine more often than directed can cause serious side effects, even death. Talk to your healthcare provider about safe handling and disposal of this medicine. You may need to take special precautions. Talk to your pediatrician regarding the use of this medicine in children. While this drug may be prescribed for selected conditions, precautions do apply. Overdosage: If you think you have taken too much of this medicine contact a poison control center or emergency room at once. NOTE: This medicine is only for you. Do not share this medicine with others. What if I miss a dose? If you miss a dose, talk with your doctor or health care professional. Do not take double or extra doses. What may interact with this medicine? This medicine may interact with the following medication: -acitretin -aspirin and aspirin-like medicines including salicylates -azathioprine -certain antibiotics like penicillins, tetracycline, and chloramphenicol -cyclosporine -gold -hydroxychloroquine -live virus vaccines -NSAIDs, medicines for pain and inflammation, like ibuprofen or naproxen -other cytotoxic agents -penicillamine -phenylbutazone -phenytoin -probenecid -retinoids such as isotretinoin and tretinoin -steroid medicines like prednisone or cortisone -sulfonamides like sulfasalazine and trimethoprim/sulfamethoxazole -theophylline This list may not describe all possible interactions. Give your health care provider a list of all the medicines, herbs, non-prescription drugs, or dietary supplements you use. Also tell them if you smoke, drink alcohol, or use illegal drugs. Some items  may interact with your medicine. What should I watch for while using this medicine? Avoid alcoholic drinks. This medicine can make you more sensitive to the sun. Keep out of the sun. If you cannot avoid being in the sun, wear protective clothing and use sunscreen. Do not use sun lamps or tanning beds/booths. You may need blood work done while you are taking this medicine. Call your doctor or health care professional for advice if you get a fever, chills or sore throat, or other symptoms of a cold or flu. Do not treat yourself. This drug decreases your body's ability to fight infections. Try to avoid being around people who are sick. This medicine may increase your risk to bruise or bleed. Call your doctor or health care professional if you notice any unusual bleeding. Check with your doctor or health care professional if you get an attack of severe diarrhea, nausea and vomiting, or if you sweat a lot. The loss of too much body fluid can make it dangerous for you to take this medicine. Talk to your doctor about your risk of cancer. You may be more at risk for certain types of cancers if you take this medicine. Both men and women must use effective birth control with this medicine. Do not become pregnant while taking this medicine or until at least 1 normal menstrual cycle has occurred after stopping it. Women should inform their doctor if they wish to become pregnant or think they might be pregnant. Men should not father a child while taking this medicine and for 3 months after stopping it. There is a potential for serious side effects to an unborn child. Talk to  your health care professional or pharmacist for more information. Do not breast-feed an infant while taking this medicine. What side effects may I notice from receiving this medicine? Side effects that you should report to your doctor or health care professional as soon as possible: -allergic reactions like skin rash, itching or hives, swelling  of the face, lips, or tongue -breathing problems or shortness of breath -diarrhea -dry, nonproductive cough -low blood counts - this medicine may decrease the number of white blood cells, red blood cells and platelets. You may be at increased risk for infections and bleeding. -mouth sores -redness, blistering, peeling or loosening of the skin, including inside the mouth -signs of infection - fever or chills, cough, sore throat, pain or trouble passing urine -signs and symptoms of bleeding such as bloody or black, tarry stools; red or dark-brown urine; spitting up blood or brown material that looks like coffee grounds; red spots on the skin; unusual bruising or bleeding from the eye, gums, or nose -signs and symptoms of kidney injury like trouble passing urine or change in the amount of urine -signs and symptoms of liver injury like dark yellow or brown urine; general ill feeling or flu-like symptoms; light-colored stools; loss of appetite; nausea; right upper belly pain; unusually weak or tired; yellowing of the eyes or skin Side effects that usually do not require medical attention (report to your doctor or health care professional if they continue or are bothersome): -dizziness -hair loss -tiredness -upset stomach -vomiting This list may not describe all possible side effects. Call your doctor for medical advice about side effects. You may report side effects to FDA at 1-800-FDA-1088. Where should I keep my medicine? Keep out of the reach of children. Store at room temperature between 20 and 25 degrees C (68 and 77 degrees F). Protect from light. Throw away any unused medicine after the expiration date. NOTE: This sheet is a summary. It may not cover all possible information. If you have questions about this medicine, talk to your doctor, pharmacist, or health care provider.    2016, [employer] Standard. (2015-05-08 05:39:22) Diabetes Mellitus and Food It is important for you to manage  your blood sugar (glucose) level. Your blood glucose level can be greatly affected by what you eat. Eating healthier foods in the appropriate amounts throughout the day at about the same time each day will help you control your blood glucose level. It can also help slow or prevent worsening of your diabetes mellitus. Healthy eating may even help you improve the level of your blood pressure and reach or maintain a healthy weight.  General recommendations for healthful eating and cooking habits include:  Eating meals and snacks regularly. Avoid going long periods of time without eating to lose weight.  Eating a diet that consists mainly of plant-based foods, such as fruits, vegetables, nuts, legumes, and whole grains.  Using low-heat cooking methods, such as baking, instead of high-heat cooking methods, such as deep frying. Work with your dietitian to make sure you understand how to use the Nutrition Facts information on food labels. HOW CAN FOOD AFFECT ME? Carbohydrates Carbohydrates affect your blood glucose level more than any other type of food. Your dietitian will help you determine how many carbohydrates to eat at each meal and teach you how to count carbohydrates. Counting carbohydrates is important to keep your blood glucose at a healthy level, especially if you are using insulin or taking certain medicines for diabetes mellitus. Alcohol Alcohol can cause sudden decreases  in blood glucose (hypoglycemia), especially if you use insulin or take certain medicines for diabetes mellitus. Hypoglycemia can be a life-threatening condition. Symptoms of hypoglycemia (sleepiness, dizziness, and disorientation) are similar to symptoms of having too much alcohol.  If your health care provider has given you approval to drink alcohol, do so in moderation and use the following guidelines:  Women should not have more than one drink per day, and men should not have more than two drinks per day. One drink is equal  to:  12 oz of beer.  5 oz of wine.  1 oz of hard liquor.  Do not drink on an empty stomach.  Keep yourself hydrated. Have water, diet soda, or unsweetened iced tea.  Regular soda, juice, and other mixers might contain a lot of carbohydrates and should be counted. WHAT FOODS ARE NOT RECOMMENDED? As you make food choices, it is important to remember that all foods are not the same. Some foods have fewer nutrients per serving than other foods, even though they might have the same number of calories or carbohydrates. It is difficult to get your body what it needs when you eat foods with fewer nutrients. Examples of foods that you should avoid that are high in calories and carbohydrates but low in nutrients include:  Trans fats (most processed foods list trans fats on the Nutrition Facts label).  Regular soda.  Juice.  Candy.  Sweets, such as cake, pie, doughnuts, and cookies.  Fried foods. WHAT FOODS CAN I EAT? Eat nutrient-rich foods, which will nourish your body and keep you healthy. The food you should eat also will depend on several factors, including:  The calories you need.  The medicines you take.  Your weight.  Your blood glucose level.  Your blood pressure level.  Your cholesterol level. You should eat a variety of foods, including:  Protein.  Lean cuts of meat.  Proteins low in saturated fats, such as fish, egg whites, and beans. Avoid processed meats.  Fruits and vegetables.  Fruits and vegetables that may help control blood glucose levels, such as apples, mangoes, and yams.  Dairy products.  Choose fat-free or low-fat dairy products, such as milk, yogurt, and cheese.  Grains, bread, pasta, and rice.  Choose whole grain products, such as multigrain bread, whole oats, and brown rice. These foods may help control blood pressure.  Fats.  Foods containing healthful fats, such as nuts, avocado, olive oil, canola oil, and fish. DOES EVERYONE WITH  DIABETES MELLITUS HAVE THE SAME MEAL PLAN? Because every person with diabetes mellitus is different, there is not one meal plan that works for everyone. It is very important that you meet with a dietitian who will help you create a meal plan that is just right for you.   This information is not intended to replace advice given to you by your health care provider. Make sure you discuss any questions you have with your health care provider.   Document Released: 05/30/2005 Document Revised: 09/23/2014 Document Reviewed: 07/30/2013 [employer] Interactive Patient Education [employer].

## 2016-01-23 NOTE — Discharge Summary (Signed)
Physician Discharge Summary  Dustin [name] [id number] DOB: 17-Jul-1961 DOA: 01/20/2016  PCP: Collene Mares, PA-C  Admit date: 01/20/2016 Discharge date: 01/23/2016  Recommendations for Outpatient Follow-up:  1. No changes in meds on discharge   Discharge Diagnoses:  Principal Problem:   Chest pain Active Problems:   Morbid obesity ([place])   Rheumatoid arthritis ([facility])   Hyperlipidemia   Depression with anxiety   GERD (gastroesophageal reflux disease)   Type 2 diabetes mellitus (HCC)   Pain in the chest   Precordial pain    Discharge Condition: stable   Diet recommendation: as tolerated   History of present illness:  55 year old male with a past medical history of DM, GERD, depression, rheumatoid arthritis and COPD, had left heart catheterizations in the past in 1999, 2005, and 2012 that all showed no obstructive CAD. Pt has a strong family history of CAD. His father died of sudden cardiac death when he was 1 and his younger brother died of MI at the age of 54. He is to sisters also died of MI. His older brother had 4 MIs and CABG and he is still alive.   Patient presented to the ED on 01/20/16 with intermittent chest pain for 3 weeks. Patient reported having ongoing chest pain and the worst pain was when he was sleeping in the cabin of his truck and this chest pain awoke him from sleep. Patient described midsternal chest pain, radiating to left arm and jaw. Patient reported associated nausea and diaphoresis. He again had the similar type of pain few days prior to this admission with walking.   Patient was admitted for further evaluation of chest pain. Cardiology has seen him in consultation. Plan is for cardiac catheterization 01/22/2016. Please note patient's risk factor include obesity, former smoker, diabetes and dyslipidemia.  On admission, troponins were negative. The 12-lead EKG showed ST depression in V1 through V6 which was also noted on previous EKG.   Cardiac cath 01/22/2016  with no angiographic evidence of CAD.  Hospital Course:   Assessment & Plan:   Principal Problem:  Chest pain  - The 12-lead EKG showed ST segment depression in V1 through V6  - Troponins are negative  - Cardiac cath 5/8 with no angiographic evidence of CAD. - Continue daily aspirin and statin therapy   Active Problems:  Morbid obesity ([place])  - Counseled on diet  - Body mass index is 37.8 kg/(m^2).   Dyslipidemia associated with type 2 diabetes mellitus  - Continue statin at bedtime  - LDL 63, at goal   Type 2 diabetes mellitus without complications without long-term insulin use  - A1c on this admission 7.1 - Resume metformin on discharge    DVT prophylaxis: Lovenox subcutaneous  Code Status: full code  Family Communication: wife at the bedside this morning   Consultants:  Cardiology, Dr. Fransico Him Procedures:  Cardiac cath 01/22/2016 Antimicrobials:  None     Signed:  Leisa Lenz, MD  [facility] 01/23/2016, 10:30 AM  Pager #: [phone]  Time spent in minutes: more than 30 minutes   Discharge Exam: Filed Vitals:   01/22/16 2146 01/23/16 0500  BP: 124/69 103/58  Pulse: 60 64  Temp: 97.8 F (36.6 C) 97.7 F (36.5 C)  Resp:     Filed Vitals:   01/22/16 1526 01/22/16 1546 01/22/16 2146 01/23/16 0500  BP: 128/83 123/79 124/69 103/58  Pulse: 75 73 60 64  Temp:  97.9 F (36.6 C) 97.8 F (36.6 C) 97.7 F (  36.5 C)  TempSrc:  Oral Oral Oral  Resp: 14     Height:      Weight:    132.269 kg (291 lb 9.6 oz)  SpO2:  95% 97% 97%    General: Pt is alert, follows commands appropriately, not in acute distress Cardiovascular: Regular rate and rhythm, S1/S2 +, no murmurs Respiratory: Clear to auscultation bilaterally, no wheezing, no crackles, no rhonchi Abdominal: Soft, non tender, non distended, bowel sounds +, no guarding Extremities: no edema, no cyanosis, pulses palpable bilaterally DP and PT Neuro: Grossly nonfocal  Discharge  Instructions  Discharge Instructions    Call MD for:  difficulty breathing, headache or visual disturbances    Complete by:  As directed      Call MD for:  persistant dizziness or light-headedness    Complete by:  As directed      Call MD for:  persistant nausea and vomiting    Complete by:  As directed      Call MD for:  severe uncontrolled pain    Complete by:  As directed      Diet - low sodium heart healthy    Complete by:  As directed      Increase activity slowly    Complete by:  As directed             Medication List    STOP taking these medications        oxyCODONE-acetaminophen 7.5-325 MG tablet  Commonly known as:  PERCOCET      TAKE these medications        acetaminophen 500 MG tablet  Commonly known as:  TYLENOL  Take 500 mg by mouth every 6 (six) hours as needed for mild pain.     ALPRAZolam 1 MG tablet  Commonly known as:  XANAX  Take 1 mg by mouth 3 (three) times daily as needed for anxiety.     aspirin 81 MG tablet  Take 81 mg by mouth daily.     atorvastatin 10 MG tablet  Commonly known as:  LIPITOR  Take 1 tablet (10 mg total) by mouth daily.     buPROPion 150 MG 12 hr tablet  Commonly known as:  WELLBUTRIN SR  Take 150 mg by mouth daily. Reported on [id number]     folic acid 1 MG tablet  Commonly known as:  FOLVITE  Take 1 mg by mouth 2 (two) times daily.     ibuprofen 200 MG tablet  Commonly known as:  ADVIL,MOTRIN  Take 800 mg by mouth every 4 (four) hours as needed (arthritis pain). Reported on 01/20/2016     LUNESTA 2 MG Tabs tablet  Generic drug:  eszopiclone  Take 2 mg by mouth at bedtime. Take immediately before bedtime     metFORMIN 500 MG tablet  Commonly known as:  GLUCOPHAGE  Take 500 mg by mouth 2 (two) times daily with a meal.     methotrexate 2.5 MG tablet  Commonly known as:  RHEUMATREX  Take 25 mg by mouth once a week. Takes on Saturday.     nicotine 21 mg/24hr patch  Commonly known as:  NICODERM CQ - dosed in mg/24  hours  Place 1 patch (21 mg total) onto the skin daily.     pantoprazole 40 MG tablet  Commonly known as:  PROTONIX  Take 40 mg by mouth daily.     zolpidem 10 MG tablet  Commonly known as:  AMBIEN  Take 10 mg by  mouth at bedtime as needed for sleep. Reported on 01/20/2016           Follow-up Information    Follow up with MANN, BENJAMIN, PA-C. Schedule an appointment as soon as possible for a visit in 2 weeks.   Specialties:  Physician Assistant, Internal Medicine   Why:  Follow up appt after recent hospitalization   Contact information:   [address] [place] [id number] [phone]        The results of significant diagnostics from this hospitalization (including imaging, microbiology, ancillary and laboratory) are listed below for reference.    Significant Diagnostic Studies: Dg Chest 2 View  01/20/2016  CLINICAL DATA:  Patient with intermittent chest pain and pressure for 3 weeks. EXAM: CHEST  2 VIEW COMPARISON:  Chest radiograph 09/22/2014 FINDINGS: Anterior cervical spinal fusion hardware. Stable cardiac and mediastinal contours. No consolidative pulmonary opacities. No pleural effusion or pneumothorax. Thoracic spine degenerative changes. IMPRESSION: No active cardiopulmonary disease. Electronically Signed   By: Lovey Newcomer M.D.   On: 01/20/2016 16:42    Microbiology: No results found for this or any previous visit (from the past 240 hour(s)).   Labs: Basic Metabolic Panel:  Recent Labs Lab 01/20/16 1611 01/22/16 0537  NA 139 139  K 4.3 4.3  CL 104 105  CO2 25 25  GLUCOSE 176* 118*  BUN 18 18  CREATININE 1.05 0.95  CALCIUM 9.3 8.7*   Liver Function Tests: No results for input(s): AST, ALT, ALKPHOS, BILITOT, PROT, ALBUMIN in the last 168 hours. No results for input(s): LIPASE, AMYLASE in the last 168 hours. No results for input(s): AMMONIA in the last 168 hours. CBC:  Recent Labs Lab 01/20/16 1611 01/22/16 0537  WBC 7.6 7.0  NEUTROABS  --   4.3  HGB 13.1 12.7*  HCT 40.2 40.0  MCV 98.5 98.5  PLT 232 217   Cardiac Enzymes:  Recent Labs Lab 01/20/16 2230 01/21/16 0456  TROPONINI <0.03 <0.03   BNP: BNP (last 3 results) No results for input(s): BNP in the last 8760 hours.  ProBNP (last 3 results) No results for input(s): PROBNP in the last 8760 hours.  CBG:  Recent Labs Lab 01/22/16 1640 01/22/16 2137 01/22/16 2245 01/23/16 0724 01/23/16 1008  GLUCAP 261* 64* 175* 99 154*

## 2016-02-02 DIAGNOSIS — E119 Type 2 diabetes mellitus without complications: Secondary | ICD-10-CM | POA: Diagnosis not present

## 2016-02-02 DIAGNOSIS — Z1389 Encounter for screening for other disorder: Secondary | ICD-10-CM | POA: Diagnosis not present

## 2016-02-02 DIAGNOSIS — E782 Mixed hyperlipidemia: Secondary | ICD-10-CM | POA: Diagnosis not present

## 2016-02-02 DIAGNOSIS — R079 Chest pain, unspecified: Secondary | ICD-10-CM | POA: Diagnosis not present

## 2016-02-02 DIAGNOSIS — Z6837 Body mass index (BMI) 37.0-37.9, adult: Secondary | ICD-10-CM | POA: Diagnosis not present

## 2016-02-02 DIAGNOSIS — I10 Essential (primary) hypertension: Secondary | ICD-10-CM | POA: Diagnosis not present

## 2016-03-06 ENCOUNTER — Encounter (INDEPENDENT_AMBULATORY_CARE_PROVIDER_SITE_OTHER): Payer: Self-pay | Admitting: Internal Medicine

## 2016-03-25 ENCOUNTER — Ambulatory Visit (INDEPENDENT_AMBULATORY_CARE_PROVIDER_SITE_OTHER): Payer: BLUE CROSS/BLUE SHIELD | Admitting: Internal Medicine

## 2016-03-25 ENCOUNTER — Encounter (INDEPENDENT_AMBULATORY_CARE_PROVIDER_SITE_OTHER): Payer: Self-pay | Admitting: Internal Medicine

## 2016-03-25 ENCOUNTER — Encounter (INDEPENDENT_AMBULATORY_CARE_PROVIDER_SITE_OTHER): Payer: Self-pay | Admitting: *Deleted

## 2016-03-25 ENCOUNTER — Encounter (INDEPENDENT_AMBULATORY_CARE_PROVIDER_SITE_OTHER): Payer: Self-pay

## 2016-03-25 ENCOUNTER — Other Ambulatory Visit (INDEPENDENT_AMBULATORY_CARE_PROVIDER_SITE_OTHER): Payer: Self-pay | Admitting: Internal Medicine

## 2016-03-25 VITALS — BP 120/62 | HR 64 | Temp 97.4°F | Ht 74.0 in | Wt 302.6 lb

## 2016-03-25 DIAGNOSIS — I209 Angina pectoris, unspecified: Secondary | ICD-10-CM

## 2016-03-25 DIAGNOSIS — R0789 Other chest pain: Secondary | ICD-10-CM

## 2016-03-25 NOTE — Progress Notes (Addendum)
Subjective:    Patient ID: Dustin [name], male    DOB: [birth year], 55 y.o.   MRN: [id number]  HPI Referred by Dr. Gerarda Fraction for chest pain/EGD. In January he thought he was having an MI. Admitted to [facility] x 4 days and cardiac work up was negative. He has had this chest pain on and off x 12 years. Underwent a cholecystectomy for this pain in January. In May he started to have chest pain and was diaphoretic. Admitted to [facility] x 4 days. Cardiac work up was negative.  Hx of lap chole in February by Dr. Arnoldo Morale for cholecystitis, and cholelithiasis. 02/02/2016 H. Pylori negative. He tells me since he has had his GB removed he has only had 2 episodes of chest pain.  Appetite is good. No weight loss. Acid reflux controlled with Protonix. He tells me he has a hiatal hernia. There is no abdominal pain. Usually has a BM daily.  No melena or BRRB.  Diabetic since May of this year (New diagnosis)  01/22/2016 Cardiac cath:   The left ventricular size is normal. The left ventricular systolic function is normal. The left ventricular ejection fraction is 55-65% by visual estimate. There are no wall motion abnormalities in the left ventricle.    1. No angiographic evidence of CAD 2. Normal LV systolic function 3. Non-cardiac chest pain         04/26/2010 EGD/Colonoscopy: GERD, heartburn for 20 years, Average risk colonoscopy: Final diagnosis: healing ulcer at distal esophagus. Could not rule out associated short segment Barrett's. Pancolonic diverticulosis. Small external hemorrhoids. Biopsy: Benign GE junction. No neoplasia or dysplasia. Review of Systems Past Medical History  Diagnosis Date  . Angina     panic attacks- cardiac tests 10  . 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 GERD (gastroesophageal reflux disease)   . H/O hiatal hernia   . Anxiety   . Depression   . Tuberculosis     hx tx  . COPD (chronic obstructive pulmonary disease) ([place])   . Arthritis     RA  . Coronary  artery disease     heart cath in 1999, 2005 and 2012 with patent coronary arteries  . Diabetes mellitus type 2, noninsulin dependent ([place])   . Rheumatoid arthritis [facility])     Past Surgical History  Procedure Laterality Date  . Cervical disc surgery  2  . Tonsillectomy  72  . Anterior cervical decomp/discectomy fusion  01/21/2012    Procedure: ANTERIOR CERVICAL DECOMPRESSION/DISCECTOMY FUSION 2 LEVELS;  Surgeon: Erline Levine, MD;  Location: [place] NEURO ORS;  Service: Neurosurgery;  Laterality: N/A;  Cervical Six-Seven, Cervical Seven-Thoracic One, Anterior Cervical Decompression with Fusion Interbody Prothesis Plating and Bonegraft possible posterior Cervical Seven-Thoracic One Foraminotomy  . Cardiac catheterization  [phone]    normal coronary arteries  . Hernia repair Right 60's  . Cholecystectomy N/A 10/26/2014    Procedure: LAPAROSCOPIC CHOLECYSTECTOMY;  Surgeon: Jamesetta So, MD;  Location: [facility];  Service: General;  Laterality: N/A;  . Carpal tunnel release Bilateral   . Left elbow bursa removed Left   . Cardiac catheterization N/A 01/22/2016    Procedure: Left Heart Cath and Coronary Angiography;  Surgeon: Burnell Blanks, MD;  Location: [place] CV LAB;  Service: Cardiovascular;  Laterality: N/A;    No Known Allergies  Current Outpatient Prescriptions on File Prior to Visit  Medication Sig Dispense Refill  . acetaminophen (TYLENOL) 500 MG tablet Take 500 mg by  mouth every 6 (six) hours as needed for mild pain.    Marland Kitchen ALPRAZolam (XANAX) 1 MG tablet Take 1 mg by mouth 3 (three) times daily as needed for anxiety.    Marland Kitchen aspirin 81 MG tablet Take 81 mg by mouth daily.    Marland Kitchen atorvastatin (LIPITOR) 10 MG tablet Take 1 tablet (10 mg total) by mouth daily. 30 tablet 3  . eszopiclone (LUNESTA) 2 MG TABS tablet Take 2 mg by mouth at bedtime. Take immediately before bedtime    . folic acid (FOLVITE) 1 MG tablet Take 1 mg by mouth 2 (two) times daily.    . metFORMIN (GLUCOPHAGE)  500 MG tablet Take 500 mg by mouth 2 (two) times daily with a meal.    . methotrexate (RHEUMATREX) 2.5 MG tablet Take 25 mg by mouth once a week. Takes on Saturday.  0  . pantoprazole (PROTONIX) 40 MG tablet Take 40 mg by mouth daily.    Marland Kitchen zolpidem (AMBIEN) 10 MG tablet Take 10 mg by mouth at bedtime as needed for sleep. Reported on 01/20/2016    . ibuprofen (ADVIL,MOTRIN) 200 MG tablet Take 800 mg by mouth every 4 (four) hours as needed (arthritis pain). Reported on 03/25/2016    . nicotine (NICODERM CQ - DOSED IN MG/24 HOURS) 21 mg/24hr patch Place 1 patch (21 mg total) onto the skin daily. (Patient not taking: Reported on 03/25/2016) 28 patch 0   No current facility-administered medications on file prior to visit.        Objective:   Physical ExamBlood pressure 120/62, pulse 64, temperature 97.4 F (36.3 C), height 6\' 2"  (1.88 m), weight 302 lb 9.6 oz (137.258 kg).  Alert and oriented. Skin warm and dry. Oral mucosa is moist.   . Sclera anicteric, conjunctivae is pink. Thyroid not enlarged. No cervical lymphadenopathy. Lungs clear. Heart regular rate and rhythm.  Abdomen is soft. Bowel sounds are positive. No hepatomegaly. No abdominal masses felt. No tenderness.  No edema to lower extremities.         Assessment & Plan:  Chest pain. PUD needs to be ruled out.  EGD. The risks and benefits such as perforation, bleeding, and infection were reviewed with the patient and is agreeable.

## 2016-03-25 NOTE — Patient Instructions (Signed)
The risks and benefits such as perforation, bleeding, and infection were reviewed with the patient and is agreeable. 

## 2016-04-02 DIAGNOSIS — Z79899 Other long term (current) drug therapy: Secondary | ICD-10-CM | POA: Diagnosis not present

## 2016-05-03 ENCOUNTER — Encounter (HOSPITAL_COMMUNITY): Payer: Self-pay | Admitting: *Deleted

## 2016-05-03 ENCOUNTER — Encounter (HOSPITAL_COMMUNITY)
Admission: RE | Disposition: A | Discharge status: Home / Self Care | Payer: Self-pay | Source: Ambulatory Visit | Attending: Internal Medicine

## 2016-05-03 ENCOUNTER — Ambulatory Visit (HOSPITAL_COMMUNITY)
Admission: RE | Admit: 2016-05-03 | Discharge: 2016-05-03 | Disposition: A | Discharge status: Home / Self Care | Payer: BLUE CROSS/BLUE SHIELD | Source: Ambulatory Visit | Attending: Internal Medicine | Admitting: Internal Medicine

## 2016-05-03 DIAGNOSIS — Z7984 Long term (current) use of oral hypoglycemic drugs: Secondary | ICD-10-CM | POA: Insufficient documentation

## 2016-05-03 DIAGNOSIS — R0789 Other chest pain: Secondary | ICD-10-CM | POA: Insufficient documentation

## 2016-05-03 DIAGNOSIS — K219 Gastro-esophageal reflux disease without esophagitis: Secondary | ICD-10-CM | POA: Diagnosis present

## 2016-05-03 DIAGNOSIS — K259 Gastric ulcer, unspecified as acute or chronic, without hemorrhage or perforation: Secondary | ICD-10-CM | POA: Diagnosis not present

## 2016-05-03 DIAGNOSIS — Z1389 Encounter for screening for other disorder: Secondary | ICD-10-CM | POA: Diagnosis not present

## 2016-05-03 DIAGNOSIS — Z7982 Long term (current) use of aspirin: Secondary | ICD-10-CM | POA: Diagnosis not present

## 2016-05-03 DIAGNOSIS — J449 Chronic obstructive pulmonary disease, unspecified: Secondary | ICD-10-CM | POA: Diagnosis not present

## 2016-05-03 DIAGNOSIS — Z87891 Personal history of nicotine dependence: Secondary | ICD-10-CM | POA: Insufficient documentation

## 2016-05-03 DIAGNOSIS — K210 Gastro-esophageal reflux disease with esophagitis: Secondary | ICD-10-CM | POA: Diagnosis not present

## 2016-05-03 DIAGNOSIS — M069 Rheumatoid arthritis, unspecified: Secondary | ICD-10-CM | POA: Insufficient documentation

## 2016-05-03 DIAGNOSIS — E119 Type 2 diabetes mellitus without complications: Secondary | ICD-10-CM | POA: Insufficient documentation

## 2016-05-03 DIAGNOSIS — F419 Anxiety disorder, unspecified: Secondary | ICD-10-CM | POA: Insufficient documentation

## 2016-05-03 DIAGNOSIS — Z79899 Other long term (current) drug therapy: Secondary | ICD-10-CM | POA: Insufficient documentation

## 2016-05-03 DIAGNOSIS — K2960 Other gastritis without bleeding: Secondary | ICD-10-CM

## 2016-05-03 DIAGNOSIS — I10 Essential (primary) hypertension: Secondary | ICD-10-CM | POA: Diagnosis not present

## 2016-05-03 DIAGNOSIS — I2510 Atherosclerotic heart disease of native coronary artery without angina pectoris: Secondary | ICD-10-CM | POA: Diagnosis not present

## 2016-05-03 DIAGNOSIS — L84 Corns and callosities: Secondary | ICD-10-CM | POA: Diagnosis not present

## 2016-05-03 DIAGNOSIS — K209 Esophagitis, unspecified: Secondary | ICD-10-CM | POA: Diagnosis not present

## 2016-05-03 DIAGNOSIS — K2970 Gastritis, unspecified, without bleeding: Secondary | ICD-10-CM | POA: Diagnosis not present

## 2016-05-03 DIAGNOSIS — K228 Other specified diseases of esophagus: Secondary | ICD-10-CM | POA: Diagnosis not present

## 2016-05-03 DIAGNOSIS — Z6839 Body mass index (BMI) 39.0-39.9, adult: Secondary | ICD-10-CM | POA: Diagnosis not present

## 2016-05-03 HISTORY — PX: ESOPHAGOGASTRODUODENOSCOPY: SHX5428

## 2016-05-03 LAB — GLUCOSE, CAPILLARY: Glucose-Capillary: 138 mg/dL — ABNORMAL HIGH (ref 65–99)

## 2016-05-03 SURGERY — EGD (ESOPHAGOGASTRODUODENOSCOPY)
Anesthesia: Moderate Sedation

## 2016-05-03 MED ORDER — MIDAZOLAM HCL 5 MG/5ML IJ SOLN
INTRAMUSCULAR | Status: DC | PRN
  Administered 2016-05-03: 2 mg via INTRAVENOUS
  Administered 2016-05-03: 3 mg via INTRAVENOUS
  Administered 2016-05-03: 2 mg via INTRAVENOUS
  Administered 2016-05-03: 3 mg via INTRAVENOUS

## 2016-05-03 MED ORDER — MEPERIDINE HCL 50 MG/ML IJ SOLN
INTRAMUSCULAR | Status: AC
  Filled 2016-05-03: qty 1

## 2016-05-03 MED ORDER — PROMETHAZINE HCL 25 MG/ML IJ SOLN
INTRAMUSCULAR | Status: DC | PRN
  Administered 2016-05-03: 12.5 mg via INTRAVENOUS

## 2016-05-03 MED ORDER — MEPERIDINE HCL 50 MG/ML IJ SOLN
INTRAMUSCULAR | Status: DC | PRN
  Administered 2016-05-03 (×3): 25 mg via INTRAVENOUS

## 2016-05-03 MED ORDER — BUTAMBEN-TETRACAINE-BENZOCAINE 2-2-14 % EX AERO
INHALATION_SPRAY | CUTANEOUS | Status: AC
  Filled 2016-05-03: qty 20

## 2016-05-03 MED ORDER — SODIUM CHLORIDE 0.9 % IV SOLN
INTRAVENOUS | Status: DC
  Administered 2016-05-03: 12:00:00 via INTRAVENOUS

## 2016-05-03 MED ORDER — BUTAMBEN-TETRACAINE-BENZOCAINE 2-2-14 % EX AERO
INHALATION_SPRAY | CUTANEOUS | Status: DC | PRN
  Administered 2016-05-03: 2 via TOPICAL

## 2016-05-03 MED ORDER — MIDAZOLAM HCL 5 MG/5ML IJ SOLN
INTRAMUSCULAR | Status: AC
  Filled 2016-05-03: qty 10

## 2016-05-03 MED ORDER — SIMETHICONE 40 MG/0.6ML PO SUSP
ORAL | Status: DC | PRN
  Administered 2016-05-03: 12:00:00

## 2016-05-03 MED ORDER — PROMETHAZINE HCL 25 MG/ML IJ SOLN
INTRAMUSCULAR | Status: AC
  Filled 2016-05-03: qty 1

## 2016-05-03 MED ORDER — SODIUM CHLORIDE 0.9% FLUSH
INTRAVENOUS | Status: AC
  Filled 2016-05-03: qty 10

## 2016-05-03 NOTE — Op Note (Signed)
[facility] Patient Name: Dustin [name] Procedure Date: 05/03/2016 11:40 AM MRN: [id number] Date of Birth: Oct 04, 1960 Attending MD: Hildred Laser , MD CSN: [id number] Age: 55 Admit Type: Outpatient Procedure:                Upper GI endoscopy Indications:              Reflux esophagitis, Chest pain (non cardiac) Providers:                Hildred Laser, MD, [place]is Peak B. Sharon Seller, RN, Shelby Mattocks, Technician Referring MD:             Redmond School, MD Medicines:                Promethazine 12.5 mg IV, Cetacaine spray,                            Meperidine 75 mg IV, Midazolam 10 mg IV Complications:            No immediate complications. Estimated Blood Loss:     Estimated blood loss was minimal. Procedure:                Pre-Anesthesia Assessment:                           - Prior to the procedure, a History and Physical                            was performed, and patient medications and                            allergies were reviewed. The patient's tolerance of                            previous anesthesia was also reviewed. The risks                            and benefits of the procedure and the sedation                            options and risks were discussed with the patient.                            All questions were answered, and informed consent                            was obtained. Prior Anticoagulants: The patient                            last took aspirin 7 days and dipyridamole 14 days                            prior to the procedure. ASA Grade Assessment: III -  A patient with severe systemic disease. After                            reviewing the risks and benefits, the patient was                            deemed in satisfactory condition to undergo the                            procedure.                           After obtaining informed consent, the endoscope was    passed under direct vision. Throughout the                            procedure, the patient's blood pressure, pulse, and                            oxygen saturations were monitored continuously. The                            [id number] scope was introduced through the                            mouth, and advanced to the second part of duodenum.                            The upper GI endoscopy was accomplished without                            difficulty. The patient tolerated the procedure                            well. Scope In: 12:10:04 PM Scope Out: 12:20:29 PM Total Procedure Duration: 0 hours 10 minutes 25 seconds  Findings:      The upper third of the esophagus and middle third of the esophagus were       normal.      One tongue of salmon-colored mucosa was present at 40 cm and one tongue       of salmon-colored mucosa was present from 28 to 42 cm. No other visible       abnormalities were present. The maximum longitudinal extent of these       esophageal mucosal changes was 20 cm in length. Biopsies were taken with       a cold forceps for histology.      The Z-line was irregular and was found 42 cm from the incisors.      A single non-bleeding erosion was found at the gastroesophageal junction.      Scattered inflammation characterized by erosions was found in the       gastric antrum.      The exam of the stomach was otherwise normal.      The duodenal bulb and second portion of the duodenum were normal. Impression:               -  Normal upper third of esophagus and middle third                            of esophagus.                           - Salmon-colored mucosa suspicious for                            short-segment Barrett's esophagus. Biopsied.                           - Z-line irregular, 42 cm from the incisors.                           - Single small erosion at the gastroesophageal                            junction.                           -  Gastritis.                           - Normal duodenal bulb and second portion of the                            duodenum. Moderate Sedation:      Moderate (conscious) sedation was administered by the endoscopy nurse       and supervised by the endoscopist. The following parameters were       monitored: oxygen saturation, heart rate, blood pressure, CO2       capnography and response to care. Total physician intraservice time was       20 minutes. Recommendation:           - Patient has a contact number available for                            emergencies. The signs and symptoms of potential                            delayed complications were discussed with the                            patient. Return to normal activities tomorrow.                            Written discharge instructions were provided to the                            patient.                           - Patient has a contact number available for                            emergencies. The signs  and symptoms of potential                            delayed complications were discussed with the                            patient. Return to normal activities tomorrow.                            Written discharge instructions were provided to the                            patient.                           - Resume previous diet today.                           - Continue present medications.                           - Await pathology results.                           - Perform an H. pylori serology today. Procedure Code(s):        --- Professional ---                           [phone], Esophagogastroduodenoscopy, flexible,                            transoral; with biopsy, single or multiple                           [id number], Moderate sedation services provided by the                            same physician or other qualified health care                            professional performing the diagnostic or                             therapeutic service that the sedation supports,                            requiring the presence of an independent trained                            observer to assist in the monitoring of the                            patient's level of consciousness and physiological                            status; initial 15 minutes of intraservice time,  patient age 64 years or older Diagnosis Code(s):        --- Professional ---                           K22.8, Other specified diseases of esophagus                           K22.10, Ulcer of esophagus without bleeding                           K29.70, Gastritis, unspecified, without bleeding                           K21.0, Gastro-esophageal reflux disease with                            esophagitis                           R07.89, Other chest pain CPT copyright 2016 American Medical Association. All rights reserved. The codes documented in this report are preliminary and upon coder review may  be revised to meet current compliance requirements. Hildred Laser, MD Hildred Laser, MD 05/03/2016 12:34:38 PM This report has been signed electronically. Number of Addenda: 0

## 2016-05-03 NOTE — Discharge Instructions (Signed)
Resume usual medications and diet. No driving for 24 hours. Physician will call with results of blood test and biopsy next week. Keep symptom diary as to frequency and timing of chest pain in relationship to meals or the next 1 month.   Esophagogastroduodenoscopy, Care After Refer to this sheet in the next few weeks. These instructions provide you with information about caring for yourself after your procedure. Your health care provider may also give you more specific instructions. Your treatment has been planned according to current medical practices, but problems sometimes occur. Call your health care provider if you have any problems or questions after your procedure. WHAT TO EXPECT AFTER THE PROCEDURE After your procedure, it is typical to feel:  Soreness in your throat.  Pain with swallowing.  Sick to your stomach (nauseous).  Bloated.  Dizzy.  Fatigued. HOME CARE INSTRUCTIONS  Do not eat or drink anything until the numbing medicine (local anesthetic) has worn off and your gag reflex has returned. You will know that the local anesthetic has worn off when you can swallow comfortably.  Do not drive or operate machinery until directed by your health care provider.  Take medicines only as directed by your health care provider. SEEK MEDICAL CARE IF:   You cannot stop coughing.  You are not urinating at all or less than usual. SEEK IMMEDIATE MEDICAL CARE IF:  You have difficulty swallowing.  You cannot eat or drink.  You have worsening throat or chest pain.  You have dizziness or lightheadedness or you faint.  You have nausea or vomiting.  You have chills.  You have a fever.  You have severe abdominal pain.  You have black, tarry, or bloody stools.   This information is not intended to replace advice given to you by your health care provider. Make sure you discuss any questions you have with your health care provider.   Document Released: 08/19/2012 Document  Revised: 09/23/2014 Document Reviewed: 08/19/2012 Elsevier Interactive Patient Education [employer].

## 2016-05-03 NOTE — H&P (Signed)
Dustin [name] is an 55 y.o. male.   Chief Complaint: Patient is here for EGD. HPI: Patient is 55 year old Caucasian male with multiple medical problems including chronic GERD who presents with recurrent chest pain which she's had for a number of years. He was hospitalized couple of months ago and ruled out for MI. His cardiac Previously. He says pantoprazole is control his heartburn very well with Drs. pain. He denies nausea vomiting dysphagia abdominal pain or melena. Says he had EGD but 7 years ago when he was found to have ulcers in his esophagus.  Past Medical History:  Diagnosis Date  . Angina    panic attacks- cardiac tests 12  . Anxiety   . Arthritis    RA  . 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 COPD (chronic obstructive pulmonary disease) ([place])   . Coronary artery disease    heart cath in 1999, 2005 and 2012 with patent coronary arteries  . Depression   . Diabetes mellitus type 2, noninsulin dependent ([place])   . GERD (gastroesophageal reflux disease)   . H/O hiatal hernia   . Rheumatoid arthritis ([place])   . Tuberculosis    hx tx    Past Surgical History:  Procedure Laterality Date  . ANTERIOR CERVICAL DECOMP/DISCECTOMY FUSION  01/21/2012   Procedure: ANTERIOR CERVICAL DECOMPRESSION/DISCECTOMY FUSION 2 LEVELS;  Surgeon: Erline Levine, MD;  Location: [facility];  Service: Neurosurgery;  Laterality: N/A;  Cervical Six-Seven, Cervical Seven-Thoracic One, Anterior Cervical Decompression with Fusion Interbody Prothesis Plating and Bonegraft possible posterior Cervical Seven-Thoracic One Foraminotomy  . CARDIAC CATHETERIZATION  [phone]   normal coronary arteries  . CARDIAC CATHETERIZATION N/A 01/22/2016   Procedure: Left Heart Cath and Coronary Angiography;  Surgeon: Burnell Blanks, MD;  Location: [facility];  Service: Cardiovascular;  Laterality: N/A;  . CARPAL TUNNEL RELEASE Bilateral   . CERVICAL DISC SURGERY  04  . CHOLECYSTECTOMY N/A  10/26/2014   Procedure: LAPAROSCOPIC CHOLECYSTECTOMY;  Surgeon: Jamesetta So, MD;  Location: [facility];  Service: General;  Laterality: N/A;  . HERNIA REPAIR Right 60's  . left elbow bursa removed Left   . TONSILLECTOMY  5    Family History  Problem Relation Age of Onset  . Heart disease Mother   . COPD Mother   . Heart attack Father     sudden death  . Heart attack Sister 21  . Kidney disease Sister   . Heart disease Sister   . Heart attack Brother   . Heart disease Brother   . Diabetes Brother   . Heart attack Brother   . Heart disease Brother 5    CABG  . Heart attack Brother 48   Social History:  reports that he quit smoking about 7 months ago. His smoking use included Cigarettes. He started smoking about 42 years ago. He has a 15.00 pack-year smoking history. He has never used smokeless tobacco. He reports that he does not drink alcohol or use drugs.  Allergies: No Known Allergies  Medications Prior to Admission  Medication Sig Dispense Refill  . acetaminophen (TYLENOL) 500 MG tablet Take 500 mg by mouth every 6 (six) hours as needed for mild pain.    Marland Kitchen ALPRAZolam (XANAX) 1 MG tablet Take 1 mg by mouth 3 (three) times daily as needed for anxiety.    Marland Kitchen aspirin 81 MG tablet Take 81 mg by mouth daily.    Marland Kitchen atorvastatin (LIPITOR) 10 MG tablet Take  1 tablet (10 mg total) by mouth daily. 30 tablet 3  . eszopiclone (LUNESTA) 2 MG TABS tablet Take 2 mg by mouth at bedtime. Take immediately before bedtime    . folic acid (FOLVITE) 1 MG tablet Take 1 mg by mouth 2 (two) times daily.    . metFORMIN (GLUCOPHAGE) 500 MG tablet Take 500 mg by mouth 2 (two) times daily with a meal.    . methotrexate (RHEUMATREX) 2.5 MG tablet Take 25 mg by mouth once a week. Takes on Saturday.  0  . pantoprazole (PROTONIX) 40 MG tablet Take 40 mg by mouth daily.    Marland Kitchen ibuprofen (ADVIL,MOTRIN) 200 MG tablet Take 800 mg by mouth every 4 (four) hours as needed (arthritis pain). Reported on 03/25/2016    .  nicotine (NICODERM CQ - DOSED IN MG/24 HOURS) 21 mg/24hr patch Place 1 patch (21 mg total) onto the skin daily. (Patient not taking: Reported on 03/25/2016) 28 patch 0  . zolpidem (AMBIEN) 10 MG tablet Take 10 mg by mouth at bedtime as needed for sleep. Reported on 01/20/2016      Results for orders placed or performed during the hospital encounter of 05/03/16 (from the past 48 hour(s))  Glucose, capillary     Status: Abnormal   Collection Time: 05/03/16 11:28 AM  Result Value Ref Range   Glucose-Capillary 138 (H) 65 - 99 mg/dL   No results found.  ROS  Blood pressure 118/81, pulse 70, temperature 97.7 F (36.5 C), temperature source Oral, resp. rate 13, height 6\' 1"  (1.854 m), weight 300 lb (136.1 kg), SpO2 97 %. Physical Exam  Constitutional:  Well-developed obese Caucasian male in NAD.  HENT:  Mouth/Throat: Oropharynx is clear and moist.  Eyes: Conjunctivae are normal. No scleral icterus.  Neck: No thyromegaly present.  Cardiovascular: Normal rate, regular rhythm and normal heart sounds.   No murmur heard. Respiratory: Effort normal and breath sounds normal.  No chest wall tenderness noted.  GI:  Abdomen is full but soft and nontender without organomegaly or masses.  Musculoskeletal: He exhibits no edema (1 to 2+ pitting edema involving both legs.).  Lymphadenopathy:    He has no cervical adenopathy.  Neurological: He is alert.  Skin: Skin is warm and dry.     Assessment/Plan Noncardiac chest pain. Chronic GERD. Diagnostic EGD.  Hildred Laser, MD 05/03/2016, 11:55 AM

## 2016-05-04 LAB — H. PYLORI ANTIBODY, IGG: H Pylori IgG: <0.9 U/mL (ref 0.0–0.8)

## 2016-05-07 ENCOUNTER — Encounter (HOSPITAL_COMMUNITY): Payer: Self-pay | Admitting: Internal Medicine

## 2016-05-13 ENCOUNTER — Encounter (INDEPENDENT_AMBULATORY_CARE_PROVIDER_SITE_OTHER): Payer: Self-pay | Admitting: Internal Medicine

## 2016-05-13 NOTE — Progress Notes (Signed)
Patient was given an appointment for 08/05/16 at 11:30am and was asked to be here at 11:15am.  A letter was mailed to the patient.

## 2016-06-03 DIAGNOSIS — M25561 Pain in right knee: Secondary | ICD-10-CM | POA: Diagnosis not present

## 2016-06-03 DIAGNOSIS — M79641 Pain in right hand: Secondary | ICD-10-CM | POA: Diagnosis not present

## 2016-06-03 DIAGNOSIS — M79671 Pain in right foot: Secondary | ICD-10-CM | POA: Diagnosis not present

## 2016-06-03 DIAGNOSIS — M79642 Pain in left hand: Secondary | ICD-10-CM | POA: Diagnosis not present

## 2016-06-03 DIAGNOSIS — M79672 Pain in left foot: Secondary | ICD-10-CM | POA: Diagnosis not present

## 2016-06-03 DIAGNOSIS — M2550 Pain in unspecified joint: Secondary | ICD-10-CM | POA: Diagnosis not present

## 2016-06-03 DIAGNOSIS — Z79899 Other long term (current) drug therapy: Secondary | ICD-10-CM | POA: Diagnosis not present

## 2016-06-03 DIAGNOSIS — M25562 Pain in left knee: Secondary | ICD-10-CM | POA: Diagnosis not present

## 2016-06-22 ENCOUNTER — Other Ambulatory Visit: Payer: Self-pay | Admitting: Radiology

## 2016-06-22 DIAGNOSIS — Z79899 Other long term (current) drug therapy: Secondary | ICD-10-CM

## 2016-07-19 DIAGNOSIS — Z23 Encounter for immunization: Secondary | ICD-10-CM | POA: Diagnosis not present

## 2016-07-22 ENCOUNTER — Telehealth: Payer: Self-pay | Admitting: Rheumatology

## 2016-07-22 NOTE — Telephone Encounter (Signed)
Called patient regarding pneumonia vaccine, I have told him to discuss with his primary care. IF he needs the pneumonia vaccines he can get them. Made him aware his immune response may not be as good since he has has been on MTX x12 years. He has also asked about shingles vaccine, he will discuss with his primary care, advised him they have a new Shingles vaccine which is not live which may be better for him. He wants to discuss with PCP, since he states he did not have chicken pox as a child. To you FYi

## 2016-07-22 NOTE — Telephone Encounter (Signed)
Please call patient in reference to a pheumonia vaccine

## 2016-08-05 ENCOUNTER — Ambulatory Visit (INDEPENDENT_AMBULATORY_CARE_PROVIDER_SITE_OTHER): Payer: BLUE CROSS/BLUE SHIELD | Admitting: Internal Medicine

## 2016-08-05 DIAGNOSIS — I10 Essential (primary) hypertension: Secondary | ICD-10-CM | POA: Diagnosis not present

## 2016-08-05 DIAGNOSIS — Z6839 Body mass index (BMI) 39.0-39.9, adult: Secondary | ICD-10-CM | POA: Diagnosis not present

## 2016-08-05 DIAGNOSIS — E119 Type 2 diabetes mellitus without complications: Secondary | ICD-10-CM | POA: Diagnosis not present

## 2016-08-05 DIAGNOSIS — E782 Mixed hyperlipidemia: Secondary | ICD-10-CM | POA: Diagnosis not present

## 2016-08-05 DIAGNOSIS — G894 Chronic pain syndrome: Secondary | ICD-10-CM | POA: Diagnosis not present

## 2016-08-05 DIAGNOSIS — Z1389 Encounter for screening for other disorder: Secondary | ICD-10-CM | POA: Diagnosis not present

## 2016-08-19 DIAGNOSIS — Z1389 Encounter for screening for other disorder: Secondary | ICD-10-CM | POA: Diagnosis not present

## 2016-08-19 DIAGNOSIS — Z6839 Body mass index (BMI) 39.0-39.9, adult: Secondary | ICD-10-CM | POA: Diagnosis not present

## 2016-08-19 DIAGNOSIS — J209 Acute bronchitis, unspecified: Secondary | ICD-10-CM | POA: Diagnosis not present

## 2016-08-20 ENCOUNTER — Encounter (INDEPENDENT_AMBULATORY_CARE_PROVIDER_SITE_OTHER): Payer: Self-pay | Admitting: Internal Medicine

## 2016-08-20 ENCOUNTER — Ambulatory Visit (INDEPENDENT_AMBULATORY_CARE_PROVIDER_SITE_OTHER): Payer: BLUE CROSS/BLUE SHIELD | Admitting: Internal Medicine

## 2016-09-23 ENCOUNTER — Telehealth: Payer: Self-pay | Admitting: Rheumatology

## 2016-09-23 ENCOUNTER — Other Ambulatory Visit: Payer: Self-pay | Admitting: Rheumatology

## 2016-09-23 MED ORDER — METHOTREXATE 2.5 MG PO TABS: 20.0000 mg | ORAL_TABLET | ORAL | 0 refills | Status: DC

## 2016-09-23 NOTE — Telephone Encounter (Signed)
thnaks

## 2016-09-23 NOTE — Telephone Encounter (Signed)
Patient requesting refill on methotrexate.  Please call in @ [facility].

## 2016-09-23 NOTE — Telephone Encounter (Signed)
Need labs now please. Only 30 days supply

## 2016-09-23 NOTE — Telephone Encounter (Signed)
Last Visit: 06/03/16 Next Visit: 11/08/16 Labs: 06/04/16 elevated glucose   Okay to fill 30 supply of MTX?

## 2016-09-23 NOTE — Telephone Encounter (Signed)
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 He spoke with dispatcher and is able to come home to do labs so he can get his MTX.  He is going to go for labs in the morning.

## 2016-09-24 ENCOUNTER — Other Ambulatory Visit: Payer: Self-pay | Admitting: Rheumatology

## 2016-09-24 DIAGNOSIS — Z79899 Other long term (current) drug therapy: Secondary | ICD-10-CM | POA: Diagnosis not present

## 2016-09-25 LAB — CBC WITH DIFFERENTIAL/PLATELET
BASOS ABS: 0 10*3/uL (ref 0.0–0.2)
Basos: 1 %
EOS (ABSOLUTE): 0.2 10*3/uL (ref 0.0–0.4)
Eos: 3 %
HEMOGLOBIN: 13.4 g/dL (ref 13.0–17.7)
Hematocrit: 40.6 % (ref 37.5–51.0)
IMMATURE GRANS (ABS): 0 10*3/uL (ref 0.0–0.1)
IMMATURE GRANULOCYTES: 0 %
LYMPHS: 27 %
Lymphocytes Absolute: 1.7 10*3/uL (ref 0.7–3.1)
MCH: 32.7 pg (ref 26.6–33.0)
MCHC: 33 g/dL (ref 31.5–35.7)
MCV: 99 fL — ABNORMAL HIGH (ref 79–97)
MONOCYTES: 11 %
Monocytes Absolute: 0.7 10*3/uL (ref 0.1–0.9)
NEUTROS ABS: 3.7 10*3/uL (ref 1.4–7.0)
Neutrophils: 58 %
Platelets: 207 10*3/uL (ref 150–379)
RBC: 4.1 x10E6/uL — ABNORMAL LOW (ref 4.14–5.80)
RDW: 14 % (ref 12.3–15.4)
WBC: 6.3 10*3/uL (ref 3.4–10.8)

## 2016-09-25 LAB — COMPREHENSIVE METABOLIC PANEL
ALBUMIN: 3.9 g/dL (ref 3.5–5.5)
ALT: 20 IU/L (ref 0–44)
AST: 15 IU/L (ref 0–40)
Albumin/Globulin Ratio: 1.6 (ref 1.2–2.2)
Alkaline Phosphatase: 47 IU/L (ref 39–117)
BILIRUBIN TOTAL: 0.6 mg/dL (ref 0.0–1.2)
BUN / CREAT RATIO: 21 — AB (ref 9–20)
BUN: 21 mg/dL (ref 6–24)
CALCIUM: 9 mg/dL (ref 8.7–10.2)
CO2: 27 mmol/L (ref 18–29)
CREATININE: 1.02 mg/dL (ref 0.76–1.27)
Chloride: 104 mmol/L (ref 96–106)
GFR, EST AFRICAN AMERICAN: 94 mL/min/{1.73_m2} (ref >59)
GFR, EST NON AFRICAN AMERICAN: 81 mL/min/{1.73_m2} (ref >59)
GLUCOSE: 121 mg/dL — AB (ref 65–99)
Globulin, Total: 2.5 g/dL (ref 1.5–4.5)
Potassium: 5.2 mmol/L (ref 3.5–5.2)
Sodium: 144 mmol/L (ref 134–144)
TOTAL PROTEIN: 6.4 g/dL (ref 6.0–8.5)

## 2016-09-26 NOTE — Progress Notes (Signed)
CBC normal

## 2016-10-11 ENCOUNTER — Other Ambulatory Visit: Payer: Self-pay | Admitting: Rheumatology

## 2016-10-11 MED ORDER — METHOTREXATE 2.5 MG PO TABS: 20.0000 mg | ORAL_TABLET | ORAL | 0 refills | Status: DC

## 2016-10-11 NOTE — Telephone Encounter (Signed)
Last Visit: 06/03/16 Next Visit: 11/08/16 Labs: 09/24/16 WNL  Okay to refill MTX?

## 2016-10-11 NOTE — Telephone Encounter (Signed)
ok 

## 2016-10-11 NOTE — Telephone Encounter (Signed)
Patient needs a refill of MTX sent to [facility]

## 2016-11-04 ENCOUNTER — Ambulatory Visit: Payer: Self-pay | Admitting: Rheumatology

## 2016-11-07 DIAGNOSIS — M71022 Abscess of bursa, left elbow: Secondary | ICD-10-CM | POA: Insufficient documentation

## 2016-11-07 DIAGNOSIS — M5090 Cervical disc disorder, unspecified, unspecified cervical region: Secondary | ICD-10-CM | POA: Insufficient documentation

## 2016-11-07 DIAGNOSIS — M19011 Primary osteoarthritis, right shoulder: Secondary | ICD-10-CM | POA: Insufficient documentation

## 2016-11-07 DIAGNOSIS — M19041 Primary osteoarthritis, right hand: Secondary | ICD-10-CM | POA: Insufficient documentation

## 2016-11-07 DIAGNOSIS — M170 Bilateral primary osteoarthritis of knee: Secondary | ICD-10-CM | POA: Insufficient documentation

## 2016-11-07 DIAGNOSIS — M19012 Primary osteoarthritis, left shoulder: Secondary | ICD-10-CM

## 2016-11-07 DIAGNOSIS — Z79899 Other long term (current) drug therapy: Secondary | ICD-10-CM | POA: Insufficient documentation

## 2016-11-07 DIAGNOSIS — M19042 Primary osteoarthritis, left hand: Secondary | ICD-10-CM

## 2016-11-07 NOTE — Progress Notes (Signed)
Office Visit Note  Patient: Dustin [name]             Date of Birth: [birth year]           MRN: [id number]             PCP: Glo Herring., MD Referring: [place] [employer], MD Visit Date: 11/08/2016 Occupation: @GUAROCC @    Subjective:  Follow-up Rheumatoid arthritis and high risk prescription  History of Present Illness: Dustin [name] is a 56 y.o. male  Last seen in our office 06/03/2016. Overall the patient is stable/doing well with the methotrexate area No additional joint pain swelling and stiffness. He does have some ongoing stiffness to his joints from previous damage but no acute and active issues going on.  Patient takes methotrexate 8 pills per week and folic acid 2 mg daily without any problems or issues.  History of OA of the knee joint in hands and shoulder joint with ongoing discomfort. Some days are better than others regarding those.  We've had x-rays done of patient's bilateral hands feet and knee joint at the last visit in September 2017. Please see report for full details    Activities of Daily Living:  Patient reports morning stiffness for 30 minutes.   Patient Denies nocturnal pain.  Difficulty dressing/grooming: Denies Difficulty climbing stairs: Denies Difficulty getting out of chair: Denies Difficulty using hands for taps, buttons, cutlery, and/or writing: Denies   Review of Systems  Constitutional: Negative for fatigue.  HENT: Negative for mouth sores and mouth dryness.   Eyes: Negative for dryness.  Respiratory: Negative for shortness of breath.   Gastrointestinal: Negative for constipation and diarrhea.  Musculoskeletal: Negative for myalgias and myalgias.  Skin: Negative for sensitivity to sunlight.  Neurological: Negative for memory loss.  Psychiatric/Behavioral: Negative for sleep disturbance.    PMFS History:  Patient Active Problem List   Diagnosis Date Noted  . Abscess of left olecranon bursa 11/07/2016  . High risk medication  use 11/07/2016  . Cervical disc disease 11/07/2016  . Primary osteoarthritis of both hands 11/07/2016  . Primary osteoarthritis of both knees 11/07/2016  . Primary osteoarthritis of both shoulders 11/07/2016  . Precordial pain 01/22/2016  . Pain in the chest   . Hyperlipidemia 01/20/2016  . Depression with anxiety 01/20/2016  . GERD (gastroesophageal reflux disease) 01/20/2016  . Type 2 diabetes mellitus ([place]) 01/20/2016  . Chest pain at rest 09/22/2014  . Tobacco use 09/22/2014  . Morbid obesity ([place]) 09/22/2014  . Rheumatoid arthritis ([place]) 09/22/2014  . Family history of heart disease 09/22/2014  . Chest pain 09/22/2014    Past Medical History:  Diagnosis Date  . Angina    panic attacks- cardiac tests 12  . Anxiety   . Arthritis    RA  . 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 COPD (chronic obstructive pulmonary disease) ([place])   . Coronary artery disease    heart cath in 1999, 2005 and 2012 with patent coronary arteries  . Depression   . Diabetes mellitus type 2, noninsulin dependent ([place])   . GERD (gastroesophageal reflux disease)   . H/O hiatal hernia   . Rheumatoid arthritis ([place])   . Tuberculosis    hx tx    Family History  Problem Relation Age of Onset  . Heart disease Mother   . COPD Mother   . Heart attack Father     sudden death  . Heart attack Sister 40  .  Kidney disease Sister   . Heart disease Sister   . Heart attack Brother   . Heart disease Brother   . Diabetes Brother   . Heart attack Brother   . Heart disease Brother 45    CABG  . Heart attack Brother 32   Past Surgical History:  Procedure Laterality Date  . ANTERIOR CERVICAL DECOMP/DISCECTOMY FUSION  01/21/2012   Procedure: ANTERIOR CERVICAL DECOMPRESSION/DISCECTOMY FUSION 2 LEVELS;  Surgeon: Erline Levine, MD;  Location: [place] [facility];  Service: Neurosurgery;  Laterality: N/A;  Cervical Six-Seven, Cervical Seven-Thoracic One, Anterior Cervical Decompression with Fusion Interbody  Prothesis Plating and Bonegraft possible posterior Cervical Seven-Thoracic One Foraminotomy  . CARDIAC CATHETERIZATION  [phone]   normal coronary arteries  . CARDIAC CATHETERIZATION N/A 01/22/2016   Procedure: Left Heart Cath and Coronary Angiography;  Surgeon: Burnell Blanks, MD;  Location: [facility];  Service: Cardiovascular;  Laterality: N/A;  . CARPAL TUNNEL RELEASE Bilateral   . CERVICAL DISC SURGERY  04  . CHOLECYSTECTOMY N/A 10/26/2014   Procedure: LAPAROSCOPIC CHOLECYSTECTOMY;  Surgeon: Jamesetta So, MD;  Location: [facility];  Service: General;  Laterality: N/A;  . ESOPHAGOGASTRODUODENOSCOPY N/A 05/03/2016   Procedure: ESOPHAGOGASTRODUODENOSCOPY (EGD);  Surgeon: Rogene Houston, MD;  Location: [facility];  Service: Endoscopy;  Laterality: N/A;  10:30  . HERNIA REPAIR Right 60's  . left elbow bursa removed Left   . TONSILLECTOMY  24   Social History   Social History Narrative  . No narrative on file     Objective: Vital Signs: BP 120/68   Pulse 78   Resp 16   Ht 6\' 1"  (1.854 m)   Wt (!) 308 lb (139.7 kg)   BMI 40.64 kg/m    Physical Exam  Constitutional: He is oriented to person, place, and time. He appears well-developed and well-nourished.  HENT:  Head: Normocephalic and atraumatic.  Eyes: Conjunctivae and EOM are normal. Pupils are equal, round, and reactive to light.  Neck: Normal range of motion. Neck supple.  Cardiovascular: Normal rate, regular rhythm and normal heart sounds.  Exam reveals no gallop and no friction rub.   No murmur heard. Pulmonary/Chest: Effort normal and breath sounds normal. No respiratory distress. He has no wheezes. He has no rales. He exhibits no tenderness.  Abdominal: Soft. He exhibits no distension and no mass. There is no tenderness. There is no guarding.  Musculoskeletal: Normal range of motion.  Lymphadenopathy:    He has no cervical adenopathy.  Neurological: He is alert and oriented to person, place, and  time. He exhibits normal muscle tone. Coordination normal.  Skin: Skin is warm and dry. Capillary refill takes less than 2 seconds. No rash noted.  Psychiatric: He has a normal mood and affect. His behavior is normal. Judgment and thought content normal.  Nursing note and vitals reviewed.    Musculoskeletal Exam:  Full range of motion of all joints Grip strength is equal and strong bilaterally Fibromyalgia tender points are all absent  CDAI Exam: CDAI Homunculus Exam:   Swelling:  Right hand: 1st MCP, 2nd MCP and 3rd MCP Left hand: 1st MCP, 2nd MCP and 3rd MCP  Joint Counts:  CDAI Tender Joint count: 0 CDAI Swollen Joint count: 6  Global Assessments:  Patient Global Assessment: 3 Provider Global Assessment: 3  CDAI Calculated Score: 12  No synovitis on examination but patient does have some synovial thickening of the first second and third MCP joints bilaterally  note: They are not  tender to palpation  Investigation: Findings:  July 2017:  CBC was normal.  Comprehensive metabolic panel showed glucose of 171.   -  X-ray of bilateral hands, 2 views today, showed bilateral 1st, 2nd and 3rd MCP joint narrowing, right intercarpal joint space narrowing, all PIP narrowing without any erosive changes.  There was no interval change from his previous x-rays.  Bilateral feet x-ray showed all PIP/DIP narrowing without any erosive changes and juxta-articular osteopenia consistent with osteoarthritis and rheumatoid arthritis overlap.  Bilateral knee joint x-ray showed bilateral moderate medial compartment narrowing and bilateral moderate patellofemoral narrowing consistent with moderate osteoarthritis and moderate chondromalacia patella.     Orders Only on 09/24/2016  Component Date Value Ref Range Status  . WBC 09/24/2016 6.3  3.4 - 10.8 x10E3/uL Final  . RBC 09/24/2016 4.10* 4.14 - 5.80 x10E6/uL Final  . Hemoglobin 09/24/2016 13.4  13.0 - 17.7 g/dL Final  . Hematocrit 09/24/2016 40.6   37.5 - 51.0 % Final  . MCV 09/24/2016 99* 79 - 97 fL Final  . MCH 09/24/2016 32.7  26.6 - 33.0 pg Final  . MCHC 09/24/2016 33.0  31.5 - 35.7 g/dL Final  . RDW 09/24/2016 14.0  12.3 - 15.4 % Final  . Platelets 09/24/2016 207  150 - 379 x10E3/uL Final  . Neutrophils 09/24/2016 58  Not Estab. % Final  . Lymphs 09/24/2016 27  Not Estab. % Final  . Monocytes 09/24/2016 11  Not Estab. % Final  . Eos 09/24/2016 3  Not Estab. % Final  . Basos 09/24/2016 1  Not Estab. % Final  . Neutrophils Absolute 09/24/2016 3.7  1.4 - 7.0 x10E3/uL Final  . Lymphocytes Absolute 09/24/2016 1.7  0.7 - 3.1 x10E3/uL Final  . Monocytes Absolute 09/24/2016 0.7  0.1 - 0.9 x10E3/uL Final  . EOS (ABSOLUTE) 09/24/2016 0.2  0.0 - 0.4 x10E3/uL Final  . Basophils Absolute 09/24/2016 0.0  0.0 - 0.2 x10E3/uL Final  . Immature Granulocytes 09/24/2016 0  Not Estab. % Final  . Immature Grans (Abs) 09/24/2016 0.0  0.0 - 0.1 x10E3/uL Final  . Glucose 09/24/2016 121* 65 - 99 mg/dL Final  . BUN 09/24/2016 21  6 - 24 mg/dL Final  . Creatinine, Ser 09/24/2016 1.02  0.76 - 1.27 mg/dL Final  . GFR calc non Af Amer 09/24/2016 81  >59 mL/min/1.73 Final  . GFR calc Af Amer 09/24/2016 94  >59 mL/min/1.73 Final  . BUN/Creatinine Ratio 09/24/2016 21* 9 - 20 Final  . Sodium 09/24/2016 144  134 - 144 mmol/L Final  . Potassium 09/24/2016 5.2  3.5 - 5.2 mmol/L Final  . Chloride 09/24/2016 104  96 - 106 mmol/L Final  . CO2 09/24/2016 27  18 - 29 mmol/L Final  . Calcium 09/24/2016 9.0  8.7 - 10.2 mg/dL Final  . Total Protein 09/24/2016 6.4  6.0 - 8.5 g/dL Final  . Albumin 09/24/2016 3.9  3.5 - 5.5 g/dL Final  . Globulin, Total 09/24/2016 2.5  1.5 - 4.5 g/dL Final  . Albumin/Globulin Ratio 09/24/2016 1.6  1.2 - 2.2 Final  . Bilirubin Total 09/24/2016 0.6  0.0 - 1.2 mg/dL Final  . Alkaline Phosphatase 09/24/2016 47  39 - 117 IU/L Final  . AST 09/24/2016 15  0 - 40 IU/L Final  . ALT 09/24/2016 20  0 - 44 IU/L Final      Imaging: No  results found.  Speciality Comments: No specialty comments available.    Procedures:  No procedures performed Allergies: Patient has no  known allergies.   Assessment / Plan:     Visit Diagnoses: Rheumatoid arthritis involving multiple sites with positive rheumatoid factor (HCC) - +CCP,+ANA  High risk medication use - MTX-8 / week.  - Plan: CBC with Differential/Platelet, Comprehensive metabolic panel, CBC with Differential/Platelet, Comprehensive metabolic panel  Cervical disc disease  Primary osteoarthritis of both hands  Primary osteoarthritis of both knees  Primary osteoarthritis of both shoulders   Plan: #1: Rheumatoid arthritis. Positive CCP. Ongoing joint discomfort to hands. No synovitis. Some synovial thickening of the first second and third MCP joint bilaterally.  #2: High risk prescription. On methotrexate 8 per week, folic acid 2 pills per day Patient's last labs are January 2018. Patient will be due for repeat labs every 3 months starting in April. He gets them in [facility]. I've given him a printed prescription for those labs but they are in Epic and they should be viewable by the [facility] and patient will need another paper printout  #3: I reviewed labs with the patient that was done back in September 2017. Rheumatoid factor mildly elevated ; CCP greater than 250; sedimentation rate normal  #4: Return to clinic in 5 months.  Orders: Orders Placed This Encounter  Procedures  . CBC with Differential/Platelet  . Comprehensive metabolic panel   Meds ordered this encounter  Medications  . methotrexate (RHEUMATREX) 2.5 MG tablet    Sig: Take 8 tablets (20 mg total) by mouth once a week. Caution:Chemotherapy. Protect from light.    Dispense:  96 tablet    Refill:  0    Order Specific Question:   Supervising Provider    Answer:   Bo Merino [[id number]]  . folic acid (FOLVITE) 1 MG tablet    Sig: Take 1 tablet (1 mg total) by mouth 2  (two) times daily.    Dispense:  180 tablet    Refill:  4    Order Specific Question:   Supervising Provider    Answer:   Bo Merino [phone]    Face-to-face time spent with patient was 30 minutes. 50% of time was spent in counseling and coordination of care.  Follow-Up Instructions: Return in about 5 months (around 04/07/2017).   Eliezer Lofts, PA-C  Note - This record has been created using [employer].  Chart creation errors have been sought, but may not always  have been located. Such creation errors do not reflect on  the standard of medical care.

## 2016-11-08 ENCOUNTER — Ambulatory Visit (INDEPENDENT_AMBULATORY_CARE_PROVIDER_SITE_OTHER): Payer: BLUE CROSS/BLUE SHIELD | Admitting: Rheumatology

## 2016-11-08 ENCOUNTER — Encounter: Payer: Self-pay | Admitting: Rheumatology

## 2016-11-08 VITALS — BP 120/68 | HR 78 | Resp 16 | Ht 73.0 in | Wt 308.0 lb

## 2016-11-08 DIAGNOSIS — D3131 Benign neoplasm of right choroid: Secondary | ICD-10-CM | POA: Diagnosis not present

## 2016-11-08 DIAGNOSIS — M170 Bilateral primary osteoarthritis of knee: Secondary | ICD-10-CM | POA: Diagnosis not present

## 2016-11-08 DIAGNOSIS — M19012 Primary osteoarthritis, left shoulder: Secondary | ICD-10-CM | POA: Diagnosis not present

## 2016-11-08 DIAGNOSIS — Z79899 Other long term (current) drug therapy: Secondary | ICD-10-CM | POA: Diagnosis not present

## 2016-11-08 DIAGNOSIS — E119 Type 2 diabetes mellitus without complications: Secondary | ICD-10-CM | POA: Diagnosis not present

## 2016-11-08 DIAGNOSIS — M5090 Cervical disc disorder, unspecified, unspecified cervical region: Secondary | ICD-10-CM | POA: Diagnosis not present

## 2016-11-08 DIAGNOSIS — M19011 Primary osteoarthritis, right shoulder: Secondary | ICD-10-CM | POA: Diagnosis not present

## 2016-11-08 DIAGNOSIS — M19042 Primary osteoarthritis, left hand: Secondary | ICD-10-CM | POA: Diagnosis not present

## 2016-11-08 DIAGNOSIS — M0579 Rheumatoid arthritis with rheumatoid factor of multiple sites without organ or systems involvement: Secondary | ICD-10-CM

## 2016-11-08 DIAGNOSIS — I10 Essential (primary) hypertension: Secondary | ICD-10-CM | POA: Diagnosis not present

## 2016-11-08 DIAGNOSIS — M19041 Primary osteoarthritis, right hand: Secondary | ICD-10-CM

## 2016-11-08 DIAGNOSIS — Z6839 Body mass index (BMI) 39.0-39.9, adult: Secondary | ICD-10-CM | POA: Diagnosis not present

## 2016-11-08 DIAGNOSIS — M0689 Other specified rheumatoid arthritis, multiple sites: Secondary | ICD-10-CM | POA: Diagnosis not present

## 2016-11-08 DIAGNOSIS — Z1389 Encounter for screening for other disorder: Secondary | ICD-10-CM | POA: Diagnosis not present

## 2016-11-08 MED ORDER — METHOTREXATE 2.5 MG PO TABS: 20.0000 mg | ORAL_TABLET | ORAL | 0 refills | Status: DC

## 2016-11-08 MED ORDER — FOLIC ACID 1 MG PO TABS: 1.0000 mg | ORAL_TABLET | Freq: Two times a day (BID) | ORAL | 4 refills | Status: DC

## 2017-01-09 ENCOUNTER — Telehealth: Payer: Self-pay | Admitting: Rheumatology

## 2017-01-09 DIAGNOSIS — Z79899 Other long term (current) drug therapy: Secondary | ICD-10-CM

## 2017-01-09 MED ORDER — METHOTREXATE 2.5 MG PO TABS: 20.0000 mg | ORAL_TABLET | ORAL | 0 refills | Status: DC

## 2017-01-09 MED ORDER — FOLIC ACID 1 MG PO TABS: 1.0000 mg | ORAL_TABLET | Freq: Two times a day (BID) | ORAL | 4 refills | Status: DC

## 2017-01-09 NOTE — Telephone Encounter (Signed)
Patient sates he was not aware labs were due yet He will go in the morning I have released orders.

## 2017-01-09 NOTE — Telephone Encounter (Signed)
Patient needs rf on MTX. Will need this Saturday. Aslo rf on Folic Acid. Patient uses [facility].Please call when rf called in.

## 2017-01-09 NOTE — Telephone Encounter (Signed)
11/08/16 last visit Next visit 04/18/17 Labs due, last done in Jan  Called patient to advise.   Left message for him to call me back One year of Folic acid was sent in Feb, resent this since not received

## 2017-01-09 NOTE — Telephone Encounter (Signed)
Ok to refill one month supply per Dr Estanislado Pandy

## 2017-01-10 ENCOUNTER — Other Ambulatory Visit: Payer: Self-pay | Admitting: Rheumatology

## 2017-01-10 ENCOUNTER — Other Ambulatory Visit: Payer: Self-pay | Admitting: Radiology

## 2017-01-10 DIAGNOSIS — Z79899 Other long term (current) drug therapy: Secondary | ICD-10-CM

## 2017-01-10 NOTE — Addendum Note (Signed)
Addended byCandice Camp on: 01/10/2017 02:49 PM   Modules accepted: Orders

## 2017-01-10 NOTE — Progress Notes (Signed)
Updated orders, they were put in for [facility], he goes to [facility].

## 2017-01-11 LAB — CBC WITH DIFFERENTIAL/PLATELET
Basophils Absolute: 0 10*3/uL (ref 0.0–0.2)
Basos: 0 %
EOS (ABSOLUTE): 0.2 10*3/uL (ref 0.0–0.4)
Eos: 2 %
HEMATOCRIT: 40.8 % (ref 37.5–51.0)
HEMOGLOBIN: 13.7 g/dL (ref 13.0–17.7)
IMMATURE GRANS (ABS): 0 10*3/uL (ref 0.0–0.1)
IMMATURE GRANULOCYTES: 0 %
Lymphocytes Absolute: 2.1 10*3/uL (ref 0.7–3.1)
Lymphs: 23 %
MCH: 32.5 pg (ref 26.6–33.0)
MCHC: 33.6 g/dL (ref 31.5–35.7)
MCV: 97 fL (ref 79–97)
Monocytes Absolute: 0.8 10*3/uL (ref 0.1–0.9)
Monocytes: 9 %
NEUTROS ABS: 6.2 10*3/uL (ref 1.4–7.0)
Neutrophils: 66 %
Platelets: 230 10*3/uL (ref 150–379)
RBC: 4.22 x10E6/uL (ref 4.14–5.80)
RDW: 13.8 % (ref 12.3–15.4)
WBC: 9.3 10*3/uL (ref 3.4–10.8)

## 2017-01-11 LAB — CMP14+EGFR
ALK PHOS: 54 IU/L (ref 39–117)
ALT: 20 IU/L (ref 0–44)
AST: 17 IU/L (ref 0–40)
Albumin/Globulin Ratio: 1.6 (ref 1.2–2.2)
Albumin: 4.4 g/dL (ref 3.5–5.5)
BILIRUBIN TOTAL: 0.8 mg/dL (ref 0.0–1.2)
BUN / CREAT RATIO: 14 (ref 9–20)
BUN: 16 mg/dL (ref 6–24)
CALCIUM: 9.6 mg/dL (ref 8.7–10.2)
CHLORIDE: 102 mmol/L (ref 96–106)
CO2: 28 mmol/L (ref 18–29)
Creatinine, Ser: 1.12 mg/dL (ref 0.76–1.27)
GFR calc Af Amer: 84 mL/min/{1.73_m2} (ref >59)
GFR calc non Af Amer: 73 mL/min/{1.73_m2} (ref >59)
GLUCOSE: 111 mg/dL — AB (ref 65–99)
Globulin, Total: 2.7 g/dL (ref 1.5–4.5)
Potassium: 4.9 mmol/L (ref 3.5–5.2)
SODIUM: 143 mmol/L (ref 134–144)
Total Protein: 7.1 g/dL (ref 6.0–8.5)

## 2017-01-14 ENCOUNTER — Telehealth: Payer: Self-pay | Admitting: Radiology

## 2017-01-14 MED ORDER — METHOTREXATE 2.5 MG PO TABS: 20.0000 mg | ORAL_TABLET | ORAL | 0 refills | Status: DC

## 2017-01-14 NOTE — Progress Notes (Signed)
WNL

## 2017-01-14 NOTE — Telephone Encounter (Signed)
[facility] labs received via fax, they did not result in epic There is a ticket. I have sent for scanning  I have called patient to advise labs are normal

## 2017-02-17 DIAGNOSIS — M0689 Other specified rheumatoid arthritis, multiple sites: Secondary | ICD-10-CM | POA: Diagnosis not present

## 2017-02-17 DIAGNOSIS — Z1389 Encounter for screening for other disorder: Secondary | ICD-10-CM | POA: Diagnosis not present

## 2017-02-17 DIAGNOSIS — M1991 Primary osteoarthritis, unspecified site: Secondary | ICD-10-CM | POA: Diagnosis not present

## 2017-02-17 DIAGNOSIS — E119 Type 2 diabetes mellitus without complications: Secondary | ICD-10-CM | POA: Diagnosis not present

## 2017-02-17 DIAGNOSIS — I10 Essential (primary) hypertension: Secondary | ICD-10-CM | POA: Diagnosis not present

## 2017-02-17 DIAGNOSIS — Z6836 Body mass index (BMI) 36.0-36.9, adult: Secondary | ICD-10-CM | POA: Diagnosis not present

## 2017-04-14 DIAGNOSIS — M0630 Rheumatoid nodule, unspecified site: Secondary | ICD-10-CM | POA: Insufficient documentation

## 2017-04-14 DIAGNOSIS — F5101 Primary insomnia: Secondary | ICD-10-CM | POA: Insufficient documentation

## 2017-04-14 DIAGNOSIS — Z9289 Personal history of other medical treatment: Secondary | ICD-10-CM | POA: Insufficient documentation

## 2017-04-14 NOTE — Progress Notes (Signed)
Office Visit Note  Patient: Dustin [name]             Date of Birth: 1961-06-02           MRN: [id number]             PCP: Redmond School, MD Referring: Redmond School, MD Visit Date: 04/18/2017 Occupation: @GUAROCC @    Subjective:  Follow-up (5 month follow up. Constant right hand and wrist pain, pain is worse in morning.)   History of Present Illness: ASSAD HARBESON is a 56 y.o. male with history of sero positive rheumatoid arthritis and nodulosis. He states she's noticed more discomfort in his right wrist and right hand. He's also noticed prominence of her DIP joints bilaterally which is been bothersome. He is right-handed. He is not having much discomfort in his shoulders and knee joints currently. His neck is doing all right. He has noticed a nodule on his right elbow. Which is not painful.  Activities of Daily Living:  Patient reports morning stiffness for 0 minute.   Patient Denies nocturnal pain.  Difficulty dressing/grooming: Denies Difficulty climbing stairs: Denies Difficulty getting out of chair: Denies Difficulty using hands for taps, buttons, cutlery, and/or writing: Denies   Review of Systems  Constitutional: Positive for fatigue. Negative for night sweats and weakness ( ).  HENT: Negative for mouth sores, mouth dryness and nose dryness.   Eyes: Negative for redness and dryness.  Respiratory: Negative for shortness of breath and difficulty breathing.   Cardiovascular: Negative for chest pain, palpitations, hypertension, irregular heartbeat and swelling in legs/feet.  Gastrointestinal: Negative for constipation and diarrhea.  Endocrine: Negative for increased urination.  Musculoskeletal: Positive for arthralgias and joint pain. Negative for joint swelling, myalgias, muscle weakness, muscle tenderness and myalgias.  Skin: Negative for color change, rash, hair loss, nodules/bumps, skin tightness, ulcers and sensitivity to sunlight.  Allergic/Immunologic: Negative  for susceptible to infections.  Neurological: Negative for dizziness, fainting, memory loss and night sweats.  Hematological: Negative for swollen glands.  Psychiatric/Behavioral: Positive for sleep disturbance. Negative for depressed mood. The patient is nervous/anxious.     PMFS History:  Patient Active Problem List   Diagnosis Date Noted  . Former smoker 04/18/2017  . Primary insomnia 04/14/2017  . 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 History of positive PPD treated with INH  04/14/2017  . Abscess of left olecranon bursa 11/07/2016  . High risk medication use 11/07/2016  . Cervical disc disease 11/07/2016  . Primary osteoarthritis of both hands 11/07/2016  . Primary osteoarthritis of both knees 11/07/2016  . Primary osteoarthritis of both shoulders 11/07/2016  . Precordial pain 01/22/2016  . Pain in the chest   . Hyperlipidemia 01/20/2016  . Depression with anxiety 01/20/2016  . GERD (gastroesophageal reflux disease) 01/20/2016  . Type 2 diabetes mellitus ([place]) 01/20/2016  . Chest pain at rest 09/22/2014  . Tobacco use 09/22/2014  . Morbid obesity ([place]) 09/22/2014  . Rheumatoid arthritis ([place]) 09/22/2014  . Family history of heart disease 09/22/2014  . Chest pain 09/22/2014    Past Medical History:  Diagnosis Date  . Angina    panic attacks- cardiac tests 12  . Anxiety   . Arthritis    RA  . 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 COPD (chronic obstructive pulmonary disease) ([place])   . Coronary artery disease    heart cath in 1999, 2005 and 2012 with patent coronary arteries  . Depression   .  Diabetes mellitus type 2, noninsulin dependent ([place])   . GERD (gastroesophageal reflux disease)   . H/O hiatal hernia   . Rheumatoid arthritis ([place])   . Tuberculosis    hx tx    Family History  Problem Relation Age of Onset  . Heart disease Mother   . COPD Mother   . Heart attack Father        sudden death  . Heart attack Sister 34  . Kidney disease  Sister   . Heart disease Sister   . Heart attack Brother   . Heart disease Brother   . Diabetes Brother   . Heart attack Brother   . Heart disease Brother 37       CABG  . Heart attack Brother 12   Past Surgical History:  Procedure Laterality Date  . ANTERIOR CERVICAL DECOMP/DISCECTOMY FUSION  01/21/2012   Procedure: ANTERIOR CERVICAL DECOMPRESSION/DISCECTOMY FUSION 2 LEVELS;  Surgeon: Erline Levine, MD;  Location: [facility];  Service: Neurosurgery;  Laterality: N/A;  Cervical Six-Seven, Cervical Seven-Thoracic One, Anterior Cervical Decompression with Fusion Interbody Prothesis Plating and Bonegraft possible posterior Cervical Seven-Thoracic One Foraminotomy  . CARDIAC CATHETERIZATION  [phone]   normal coronary arteries  . CARDIAC CATHETERIZATION N/A 01/22/2016   Procedure: Left Heart Cath and Coronary Angiography;  Surgeon: Burnell Blanks, MD;  Location: [facility];  Service: Cardiovascular;  Laterality: N/A;  . CARPAL TUNNEL RELEASE Bilateral   . CERVICAL DISC SURGERY  04  . CHOLECYSTECTOMY N/A 10/26/2014   Procedure: LAPAROSCOPIC CHOLECYSTECTOMY;  Surgeon: Jamesetta So, MD;  Location: [facility];  Service: General;  Laterality: N/A;  . ESOPHAGOGASTRODUODENOSCOPY N/A 05/03/2016   Procedure: ESOPHAGOGASTRODUODENOSCOPY (EGD);  Surgeon: Rogene Houston, MD;  [facility];  Service: Endoscopy;  Laterality: N/A;  10:30  . HERNIA REPAIR Right 60's  . left elbow bursa removed Left   . TONSILLECTOMY  65   Social History   Social History Narrative  . No narrative on file     Objective: Vital Signs: BP 140/82 (BP Location: Left Arm, Patient Position: Sitting, Cuff Size: Normal)   Pulse 78   Ht 6\' 1"  (1.854 m)   Wt 296 lb (134.3 kg)   BMI 39.05 kg/m    Physical Exam  Constitutional: He is oriented to person, place, and time. He appears well-developed and well-nourished.  HENT:  Head: Normocephalic and atraumatic.  Eyes: Pupils are equal, round, and  reactive to light. Conjunctivae and EOM are normal.  Neck: Normal range of motion. Neck supple.  Cardiovascular: Normal rate, regular rhythm and normal heart sounds.   Pulmonary/Chest: Effort normal and breath sounds normal.  Abdominal: Soft. Bowel sounds are normal.  Neurological: He is alert and oriented to person, place, and time.  Skin: Skin is warm and dry. Capillary refill takes less than 2 seconds.  A small rubbery nodule noted on the right elbow. Few small nodules noted on right thumb.  Psychiatric: He has a normal mood and affect. His behavior is normal.  Nursing note and vitals reviewed.    Musculoskeletal Exam: C-spine limited range of motion. Shoulder joints elbow joints with good range of motion. He had no synovitis on palpation of her wrist joints although he  had tenderness on palpation of her right wrist joint. No synovitis was noted. Over MCPs or PIP joints. He is some DIP thickening consistent with osteoarthritis. Hip joints knee joints ankles MTPs PIPs with good range of motion.  CDAI Exam: CDAI Homunculus Exam:  Tenderness:  RUE: wrist  Joint Counts:  CDAI Tender Joint count: 1 CDAI Swollen Joint count: 0  Global Assessments:  Patient Global Assessment: 5 Provider Global Assessment: 2  CDAI Calculated Score: 8    Investigation: No additional findings. CBC Latest Ref Rng & Units 01/10/2017 09/24/2016 01/22/2016  WBC 3.4 - 10.8 x10E3/uL 9.3 6.3 7.0  Hemoglobin 13.0 - 17.7 g/dL 13.7 13.4 12.7(L)  Hematocrit 37.5 - 51.0 % 40.8 40.6 40.0  Platelets 150 - 379 x10E3/uL 230 207 217   CMP Latest Ref Rng & Units 01/10/2017 09/24/2016 01/22/2016  Glucose 65 - 99 mg/dL 111(H) 121(H) 118(H)  BUN 6 - 24 mg/dL 16 21 18   Creatinine 0.76 - 1.27 mg/dL 1.12 1.02 0.95  Sodium 134 - 144 mmol/L 143 144 139  Potassium 3.5 - 5.2 mmol/L 4.9 5.2 4.3  Chloride 96 - 106 mmol/L 102 104 105  CO2 18 - 29 mmol/L 28 27 25   Calcium 8.7 - 10.2 mg/dL 9.6 9.0 8.7(L)  Total Protein 6.0 - 8.5  g/dL 7.1 6.4 -  Total Bilirubin 0.0 - 1.2 mg/dL 0.8 0.6 -  Alkaline Phos 39 - 117 IU/L 54 47 -  AST 0 - 40 IU/L 17 15 -  ALT 0 - 44 IU/L 20 20 -     Imaging: No results found.  Speciality Comments: No specialty comments available.    Procedures:  No procedures performed Allergies: Patient has no known allergies.   Assessment / Plan:     Visit Diagnoses: Rheumatoid arthritis involving multiple sites  - +RF,+anti-CCP,+ANA, with nodulosis. History rheumatoid arthritis seems to be very well controlled on methotrexate. I do not see any synovitis on examination today. He has intermittent discomfort in his right wrist and hand. His x-rays were done and September 2017 which I reviewed today and did not show any progression.  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 He wants his right elbow nodule resected. Dr. Durward Fortes discuss that with him today. We will schedule an outpatient procedure.  High risk medication use - Methotrexate 8 tablets by mouth every week, folic acid 2 mg by mouth daily. We will check his labs today and then every 3 months to monitor for drug toxicity.  History of positive PPD treated with INH   Primary osteoarthritis of both hands: Joint protection and muscle strengthening discussed.  Primary osteoarthritis of both shoulders: Doing fairly well.  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  Cervical disc disease s/p fusion : He is fairly good range of motion without much discomfort.  Primary insomnia: Manageable with medications.  Morbid obesity ([place]): Weight loss diet and exercise emphasized.  His other medical problems are listed as follows:  History of diabetes mellitus  History of hyperlipidemia  History of depression with anxiety  Gastroesophageal reflux disease with esophagitis - h/o esophageal ulcer  Former smoker    Orders: No  orders of the defined types were placed in this encounter.  No orders of the defined types were placed in this encounter.   Face-to-face time spent with patient was 30 minutes. 50% of time was spent in counseling and coordination of care.  Follow-Up Instructions: Return in about 5 months (around 09/18/2017) for Rheumatoid arthritis, Osteoarthritis.   Bo Merino, MD  Note - This record has been created using Editor, commissioning.  Chart creation errors have been sought, but may not always  have been located.  Such creation errors do not reflect on  the standard of medical care.

## 2017-04-18 ENCOUNTER — Encounter: Payer: Self-pay | Admitting: Rheumatology

## 2017-04-18 ENCOUNTER — Ambulatory Visit (INDEPENDENT_AMBULATORY_CARE_PROVIDER_SITE_OTHER): Payer: BLUE CROSS/BLUE SHIELD | Admitting: Rheumatology

## 2017-04-18 VITALS — BP 140/82 | HR 78 | Ht 73.0 in | Wt 296.0 lb

## 2017-04-18 DIAGNOSIS — M0579 Rheumatoid arthritis with rheumatoid factor of multiple sites without organ or systems involvement: Secondary | ICD-10-CM

## 2017-04-18 DIAGNOSIS — Z79899 Other long term (current) drug therapy: Secondary | ICD-10-CM

## 2017-04-18 DIAGNOSIS — M0630 Rheumatoid nodule, unspecified site: Secondary | ICD-10-CM | POA: Diagnosis not present

## 2017-04-18 DIAGNOSIS — K2100 Gastro-esophageal reflux disease with esophagitis, without bleeding: Secondary | ICD-10-CM

## 2017-04-18 DIAGNOSIS — M5090 Cervical disc disorder, unspecified, unspecified cervical region: Secondary | ICD-10-CM | POA: Diagnosis not present

## 2017-04-18 DIAGNOSIS — M19011 Primary osteoarthritis, right shoulder: Secondary | ICD-10-CM

## 2017-04-18 DIAGNOSIS — M19041 Primary osteoarthritis, right hand: Secondary | ICD-10-CM | POA: Diagnosis not present

## 2017-04-18 DIAGNOSIS — Z8639 Personal history of other endocrine, nutritional and metabolic disease: Secondary | ICD-10-CM

## 2017-04-18 DIAGNOSIS — F5101 Primary insomnia: Secondary | ICD-10-CM

## 2017-04-18 DIAGNOSIS — R7611 Nonspecific reaction to tuberculin skin test without active tuberculosis: Secondary | ICD-10-CM

## 2017-04-18 DIAGNOSIS — Z9289 Personal history of other medical treatment: Secondary | ICD-10-CM

## 2017-04-18 DIAGNOSIS — Z8659 Personal history of other mental and behavioral disorders: Secondary | ICD-10-CM | POA: Diagnosis not present

## 2017-04-18 DIAGNOSIS — M170 Bilateral primary osteoarthritis of knee: Secondary | ICD-10-CM | POA: Diagnosis not present

## 2017-04-18 DIAGNOSIS — M19042 Primary osteoarthritis, left hand: Secondary | ICD-10-CM

## 2017-04-18 DIAGNOSIS — Z87891 Personal history of nicotine dependence: Secondary | ICD-10-CM | POA: Insufficient documentation

## 2017-04-18 DIAGNOSIS — M19012 Primary osteoarthritis, left shoulder: Secondary | ICD-10-CM

## 2017-04-18 LAB — CBC WITH DIFFERENTIAL/PLATELET
BASOS ABS: 71 {cells}/uL (ref 0–200)
Basophils Relative: 1 %
EOS ABS: 142 {cells}/uL (ref 15–500)
Eosinophils Relative: 2 %
HEMATOCRIT: 40.1 % (ref 38.5–50.0)
Hemoglobin: 13.2 g/dL (ref 13.2–17.1)
LYMPHS PCT: 21 %
Lymphs Abs: 1491 cells/uL (ref 850–3900)
MCH: 32.8 pg (ref 27.0–33.0)
MCHC: 32.9 g/dL (ref 32.0–36.0)
MCV: 99.8 fL (ref 80.0–100.0)
MONO ABS: 781 {cells}/uL (ref 200–950)
MONOS PCT: 11 %
MPV: 10.7 fL (ref 7.5–12.5)
Neutro Abs: 4615 cells/uL (ref 1500–7800)
Neutrophils Relative %: 65 %
PLATELETS: 233 10*3/uL (ref 140–400)
RBC: 4.02 MIL/uL — ABNORMAL LOW (ref 4.20–5.80)
RDW: 13.5 % (ref 11.0–15.0)
WBC: 7.1 10*3/uL (ref 3.8–10.8)

## 2017-04-18 NOTE — Addendum Note (Signed)
Addended by: Bo Merino on: 04/18/2017 10:14 AM   Modules accepted: Orders

## 2017-04-18 NOTE — Patient Instructions (Signed)
Standing Labs We placed an order today for your standing lab work.    Please come back and get your standing labs in November 2018 and every 3 months.  We have open lab Monday through Friday from 8:30-11:30 AM and 1:30-4 PM at the office of Dr. Shaili Deveshwar.   The office is located at [address], [address], [place], [place] [address] No appointment is necessary.   Labs are drawn by [facility].  You may receive a bill from [facility] for your lab work. If you have any questions regarding directions or hours of operation,  please call [phone].     

## 2017-04-18 NOTE — Progress Notes (Signed)
Rheumatology Medication Review by a Pharmacist Does the patient feel that his/her medications are working for him/her?  Yes Has the patient been experiencing any side effects to the medications prescribed?  No Does the patient have any problems obtaining medications?  No  Issues to address at subsequent visits: None   Pharmacist comments:  Dustin [name] is a pleasant 56 yo M who presents for follow up of rheumatoid arthritis.  He is currently taking methotrexate 8 tablets weekly and folic acid 2 mg daily.  Most recent standing labs were normal on 01/10/17.  Patient is due for standing labs again today.  He reports he tried to go to [facility] this morning to get his labs drawn but was unable to get them drawn due to the wait.  He will get labs done today then every 3 months.  Patient denies any questions or concerns regarding his medications at this time.   Elisabeth Most, Pharm.D., BCPS, CPP Clinical Pharmacist Pager: [phone] Phone: [phone] 04/18/2017 9:43 AM

## 2017-04-19 LAB — COMPLETE METABOLIC PANEL WITH GFR
ALK PHOS: 43 U/L (ref 40–115)
ALT: 16 U/L (ref 9–46)
AST: 16 U/L (ref 10–35)
Albumin: 4 g/dL (ref 3.6–5.1)
BUN: 19 mg/dL (ref 7–25)
CALCIUM: 9.2 mg/dL (ref 8.6–10.3)
CO2: 24 mmol/L (ref 20–31)
Chloride: 108 mmol/L (ref 98–110)
Creat: 1 mg/dL (ref 0.70–1.33)
GFR, EST NON AFRICAN AMERICAN: 83 mL/min (ref >=60)
Glucose, Bld: 127 mg/dL — ABNORMAL HIGH (ref 65–99)
Potassium: 4.9 mmol/L (ref 3.5–5.3)
Sodium: 142 mmol/L (ref 135–146)
Total Bilirubin: 1.1 mg/dL (ref 0.2–1.2)
Total Protein: 6.4 g/dL (ref 6.1–8.1)

## 2017-04-20 NOTE — Progress Notes (Signed)
WNLs , mildly elevated glucose.

## 2017-04-21 ENCOUNTER — Ambulatory Visit: Payer: BLUE CROSS/BLUE SHIELD | Admitting: Rheumatology

## 2017-05-26 DIAGNOSIS — E669 Obesity, unspecified: Secondary | ICD-10-CM | POA: Diagnosis not present

## 2017-05-26 DIAGNOSIS — M0689 Other specified rheumatoid arthritis, multiple sites: Secondary | ICD-10-CM | POA: Diagnosis not present

## 2017-05-26 DIAGNOSIS — Z1389 Encounter for screening for other disorder: Secondary | ICD-10-CM | POA: Diagnosis not present

## 2017-05-26 DIAGNOSIS — R05 Cough: Secondary | ICD-10-CM | POA: Diagnosis not present

## 2017-05-26 DIAGNOSIS — Z6837 Body mass index (BMI) 37.0-37.9, adult: Secondary | ICD-10-CM | POA: Diagnosis not present

## 2017-05-26 DIAGNOSIS — E119 Type 2 diabetes mellitus without complications: Secondary | ICD-10-CM | POA: Diagnosis not present

## 2017-06-15 DIAGNOSIS — Z23 Encounter for immunization: Secondary | ICD-10-CM | POA: Diagnosis not present

## 2017-07-16 ENCOUNTER — Telehealth: Payer: Self-pay | Admitting: Rheumatology

## 2017-07-16 NOTE — Telephone Encounter (Signed)
Patient called wanting to know when the last time he did blood work.  CB#[phone].  Thank you.

## 2017-07-16 NOTE — Telephone Encounter (Signed)
Patient advised lab labs were 04/18/17. Patient advised he will be due for labs at the beginning on November. Patient states he will update labs on 07/21/17.

## 2017-07-21 DIAGNOSIS — Z79899 Other long term (current) drug therapy: Secondary | ICD-10-CM | POA: Diagnosis not present

## 2017-07-22 LAB — CMP14+EGFR
ALBUMIN: 4.4 g/dL (ref 3.5–5.5)
ALK PHOS: 53 IU/L (ref 39–117)
ALT: 17 IU/L (ref 0–44)
AST: 20 IU/L (ref 0–40)
Albumin/Globulin Ratio: 1.7 (ref 1.2–2.2)
BILIRUBIN TOTAL: 1.2 mg/dL (ref 0.0–1.2)
BUN / CREAT RATIO: 17 (ref 9–20)
BUN: 17 mg/dL (ref 6–24)
CHLORIDE: 98 mmol/L (ref 96–106)
CO2: 27 mmol/L (ref 20–29)
Calcium: 9.4 mg/dL (ref 8.7–10.2)
Creatinine, Ser: 1.02 mg/dL (ref 0.76–1.27)
GFR calc Af Amer: 93 mL/min/{1.73_m2} (ref >59)
GFR calc non Af Amer: 81 mL/min/{1.73_m2} (ref >59)
GLUCOSE: 113 mg/dL — AB (ref 65–99)
Globulin, Total: 2.6 g/dL (ref 1.5–4.5)
Potassium: 4.9 mmol/L (ref 3.5–5.2)
Sodium: 140 mmol/L (ref 134–144)
Total Protein: 7 g/dL (ref 6.0–8.5)

## 2017-07-22 NOTE — Progress Notes (Signed)
CMP normal

## 2017-07-31 ENCOUNTER — Other Ambulatory Visit: Payer: Self-pay | Admitting: Rheumatology

## 2017-07-31 DIAGNOSIS — Z79899 Other long term (current) drug therapy: Secondary | ICD-10-CM

## 2017-08-01 NOTE — Telephone Encounter (Signed)
Last visit: 04/18/2017 Next visit: 09/29/2017 Labs: 07/21/2017 CMP normal   Advised patient he needs a CBC drawn, he will go to [facility] and have that drawn. Order has been placed.   Okay to refill 30 day supply, per Dr. Estanislado Pandy.

## 2017-08-11 ENCOUNTER — Other Ambulatory Visit: Payer: Self-pay | Admitting: Rheumatology

## 2017-08-11 DIAGNOSIS — Z79899 Other long term (current) drug therapy: Secondary | ICD-10-CM | POA: Diagnosis not present

## 2017-08-12 LAB — CBC WITH DIFFERENTIAL/PLATELET
BASOS: 0 %
Basophils Absolute: 0 10*3/uL (ref 0.0–0.2)
EOS (ABSOLUTE): 0.1 10*3/uL (ref 0.0–0.4)
EOS: 1 %
HEMATOCRIT: 41.1 % (ref 37.5–51.0)
HEMOGLOBIN: 13.1 g/dL (ref 13.0–17.7)
IMMATURE GRANS (ABS): 0 10*3/uL (ref 0.0–0.1)
Immature Granulocytes: 0 %
LYMPHS: 16 %
Lymphocytes Absolute: 1.2 10*3/uL (ref 0.7–3.1)
MCH: 31.6 pg (ref 26.6–33.0)
MCHC: 31.9 g/dL (ref 31.5–35.7)
MCV: 99 fL — AB (ref 79–97)
MONOCYTES: 7 %
Monocytes Absolute: 0.6 10*3/uL (ref 0.1–0.9)
NEUTROS ABS: 6.1 10*3/uL (ref 1.4–7.0)
Neutrophils: 76 %
Platelets: 221 10*3/uL (ref 150–379)
RBC: 4.15 x10E6/uL (ref 4.14–5.80)
RDW: 14 % (ref 12.3–15.4)
WBC: 8 10*3/uL (ref 3.4–10.8)

## 2017-08-12 NOTE — Progress Notes (Signed)
CBC stable

## 2017-08-26 ENCOUNTER — Other Ambulatory Visit: Payer: Self-pay | Admitting: Rheumatology

## 2017-08-27 ENCOUNTER — Telehealth: Payer: Self-pay

## 2017-08-27 NOTE — Telephone Encounter (Signed)
Patient would like a Rx refill on MTX .  Send to [facility].  Cb# is [phone].  Thank You.

## 2017-08-27 NOTE — Telephone Encounter (Signed)
Last visit: 04/18/2017 Next visit: 09/29/2017 Labs: 07/21/2017 CMP normal 08/11/17 CBC stable  Okay to refill per Dr. Estanislado Pandy

## 2017-08-27 NOTE — Telephone Encounter (Signed)
prescription has been sent to the pharmacy,.

## 2017-09-10 DIAGNOSIS — R079 Chest pain, unspecified: Secondary | ICD-10-CM | POA: Diagnosis not present

## 2017-09-10 DIAGNOSIS — E748 Other specified disorders of carbohydrate metabolism: Secondary | ICD-10-CM | POA: Diagnosis not present

## 2017-09-10 DIAGNOSIS — I493 Ventricular premature depolarization: Secondary | ICD-10-CM | POA: Diagnosis not present

## 2017-09-10 DIAGNOSIS — Z6836 Body mass index (BMI) 36.0-36.9, adult: Secondary | ICD-10-CM | POA: Diagnosis not present

## 2017-09-10 DIAGNOSIS — I10 Essential (primary) hypertension: Secondary | ICD-10-CM | POA: Diagnosis not present

## 2017-09-10 DIAGNOSIS — E669 Obesity, unspecified: Secondary | ICD-10-CM | POA: Diagnosis not present

## 2017-09-10 DIAGNOSIS — R0609 Other forms of dyspnea: Secondary | ICD-10-CM | POA: Diagnosis not present

## 2017-09-10 DIAGNOSIS — Z1389 Encounter for screening for other disorder: Secondary | ICD-10-CM | POA: Diagnosis not present

## 2017-09-10 DIAGNOSIS — M069 Rheumatoid arthritis, unspecified: Secondary | ICD-10-CM | POA: Diagnosis not present

## 2017-09-10 DIAGNOSIS — E119 Type 2 diabetes mellitus without complications: Secondary | ICD-10-CM | POA: Diagnosis not present

## 2017-09-15 NOTE — Progress Notes (Deleted)
Office Visit Note  Patient: Dustin [name]             Date of Birth: 07-Sep-1961           MRN: [id number]             PCP: Redmond School, MD Referring: Redmond School, MD Visit Date: 09/29/2017 Occupation: @GUAROCC @    Subjective:  No chief complaint on file.   History of Present Illness: Dustin [name] is a 56 y.o. male ***   Activities of Daily Living:  Patient reports morning stiffness for *** {minute/hour:19697}.   Patient {ACTIONS;DENIES/REPORTS:21021675::"Denies"} nocturnal pain.  Difficulty dressing/grooming: {ACTIONS;DENIES/REPORTS:21021675::"Denies"} Difficulty climbing stairs: {ACTIONS;DENIES/REPORTS:21021675::"Denies"} Difficulty getting out of chair: {ACTIONS;DENIES/REPORTS:21021675::"Denies"} Difficulty using hands for taps, buttons, cutlery, and/or writing: {ACTIONS;DENIES/REPORTS:21021675::"Denies"}   No Rheumatology ROS completed.   PMFS History:  Patient Active Problem List   Diagnosis Date Noted  . Former smoker 04/18/2017  . Primary insomnia 04/14/2017  . 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 History of positive PPD treated with INH  04/14/2017  . Abscess of left olecranon bursa 11/07/2016  . High risk medication use 11/07/2016  . Cervical disc disease 11/07/2016  . Primary osteoarthritis of both hands 11/07/2016  . Primary osteoarthritis of both knees 11/07/2016  . Primary osteoarthritis of both shoulders 11/07/2016  . Precordial pain 01/22/2016  . Pain in the chest   . Hyperlipidemia 01/20/2016  . Depression with anxiety 01/20/2016  . GERD (gastroesophageal reflux disease) 01/20/2016  . Type 2 diabetes mellitus ([place]) 01/20/2016  . Chest pain at rest 09/22/2014  . Tobacco use 09/22/2014  . Morbid obesity ([place]) 09/22/2014  . Rheumatoid arthritis ([place]) 09/22/2014  . Family history of heart disease 09/22/2014  . Chest pain 09/22/2014    Past Medical History:  Diagnosis Date  . Angina    panic attacks- cardiac tests 12  . Anxiety   .  Arthritis    RA  . 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 COPD (chronic obstructive pulmonary disease) ([place])   . Coronary artery disease    heart cath in 1999, 2005 and 2012 with patent coronary arteries  . Depression   . Diabetes mellitus type 2, noninsulin dependent ([place])   . GERD (gastroesophageal reflux disease)   . H/O hiatal hernia   . Rheumatoid arthritis ([place])   . Tuberculosis    hx tx    Family History  Problem Relation Age of Onset  . Heart disease Mother   . COPD Mother   . Heart attack Father        sudden death  . Heart attack Sister 1  . Kidney disease Sister   . Heart disease Sister   . Heart attack Brother   . Heart disease Brother   . Diabetes Brother   . Heart attack Brother   . Heart disease Brother 25       CABG  . Heart attack Brother 60   Past Surgical History:  Procedure Laterality Date  . ANTERIOR CERVICAL DECOMP/DISCECTOMY FUSION  01/21/2012   Procedure: ANTERIOR CERVICAL DECOMPRESSION/DISCECTOMY FUSION 2 LEVELS;  Surgeon: Erline Levine, MD;  Location: [facility];  Service: Neurosurgery;  Laterality: N/A;  Cervical Six-Seven, Cervical Seven-Thoracic One, Anterior Cervical Decompression with Fusion Interbody Prothesis Plating and Bonegraft possible posterior Cervical Seven-Thoracic One Foraminotomy  . CARDIAC CATHETERIZATION  [phone]   normal coronary arteries  . CARDIAC CATHETERIZATION N/A 01/22/2016   Procedure: Left Heart Cath and Coronary Angiography;  Surgeon: Harrell Gave  Santina Evans, MD;  Location: [facility];  Service: Cardiovascular;  Laterality: N/A;  . CARPAL TUNNEL RELEASE Bilateral   . CERVICAL DISC SURGERY  04  . CHOLECYSTECTOMY N/A 10/26/2014   Procedure: LAPAROSCOPIC CHOLECYSTECTOMY;  Surgeon: Jamesetta So, MD;  Location: [facility];  Service: General;  Laterality: N/A;  . ESOPHAGOGASTRODUODENOSCOPY N/A 05/03/2016   Procedure: ESOPHAGOGASTRODUODENOSCOPY (EGD);  Surgeon: Rogene Houston, MD;  [facility];  Service: Endoscopy;  Laterality: N/A;  10:30  . HERNIA REPAIR Right 60's  . left elbow bursa removed Left   . TONSILLECTOMY  79   Social History   Social History Narrative  . Not on file     Objective: Vital Signs: There were no vitals taken for this visit.   Physical Exam   Musculoskeletal Exam: ***  CDAI Exam: No CDAI exam completed.    Investigation: No additional findings. CBC Latest Ref Rng & Units 08/11/2017 04/18/2017 01/10/2017  WBC 3.4 - 10.8 x10E3/uL 8.0 7.1 9.3  Hemoglobin 13.0 - 17.7 g/dL 13.1 13.2 13.7  Hematocrit 37.5 - 51.0 % 41.1 40.1 40.8  Platelets 150 - 379 x10E3/uL 221 233 230   CMP Latest Ref Rng & Units 07/21/2017 04/18/2017 01/10/2017  Glucose 65 - 99 mg/dL 113(H) 127(H) 111(H)  BUN 6 - 24 mg/dL 17 19 16   Creatinine 0.76 - 1.27 mg/dL 1.02 1.00 1.12  Sodium 134 - 144 mmol/L 140 142 143  Potassium 3.5 - 5.2 mmol/L 4.9 4.9 4.9  Chloride 96 - 106 mmol/L 98 108 102  CO2 20 - 29 mmol/L 27 24 28   Calcium 8.7 - 10.2 mg/dL 9.4 9.2 9.6  Total Protein 6.0 - 8.5 g/dL 7.0 6.4 7.1  Total Bilirubin 0.0 - 1.2 mg/dL 1.2 1.1 0.8  Alkaline Phos 39 - 117 IU/L 53 43 54  AST 0 - 40 IU/L 20 16 17   ALT 0 - 44 IU/L 17 16 20     Imaging: No results found.  Speciality Comments: No specialty comments available.    Procedures:  No procedures performed Allergies: Patient has no known allergies.   Assessment / Plan:     Visit Diagnoses: No diagnosis found.    Orders: No orders of the defined types were placed in this encounter.  No orders of the defined types were placed in this encounter.   Face-to-face time spent with patient was *** minutes. 50% of time was spent in counseling and coordination of care.  Follow-Up Instructions: No Follow-up on file.   Earnestine Mealing, CMA  Note - This record has been created using Editor, commissioning.  Chart creation errors have been sought, but may not always  have been located. Such creation errors do not reflect  on  the standard of medical care.

## 2017-09-29 ENCOUNTER — Ambulatory Visit: Payer: BLUE CROSS/BLUE SHIELD | Admitting: Rheumatology

## 2017-09-29 NOTE — Progress Notes (Signed)
Office Visit Note  Patient: Dustin [name]             Date of Birth: Jun 12, 1961           MRN: [id number]             PCP: Redmond School, MD Referring: Redmond School, MD Visit Date: 10/06/2017 Occupation: @GUAROCC @    Subjective:  Other (BIL hand/wrist pain and swelling )   History of Present Illness: Dustin [name] is a 57 y.o. male with history of sero positive rheumatoid arthritis. He states she's been having pain and discomfort in his right shoulder. His bilateral hands are painful and his bilateral wrists are painful and swollen.. He has been taking methotrexate on regular basis. He is not having much discomfort in his knees and feet currently. His been complaining of increased cramps in his hands.  Activities of Daily Living:  Patient reports morning stiffness for 10 minutes.   Patient Denies nocturnal pain.  Difficulty dressing/grooming: Denies Difficulty climbing stairs: Denies Difficulty getting out of chair: Denies Difficulty using hands for taps, buttons, cutlery, and/or writing: Denies   Review of Systems  Constitutional: Positive for fatigue. Negative for night sweats and weakness ( ).  HENT: Negative for mouth sores, mouth dryness and nose dryness.   Eyes: Negative for redness and dryness.  Respiratory: Negative for shortness of breath and difficulty breathing.   Cardiovascular: Negative for chest pain, palpitations, hypertension, irregular heartbeat and swelling in legs/feet.  Gastrointestinal: Negative for constipation and diarrhea.  Endocrine: Negative for increased urination.  Musculoskeletal: Positive for arthralgias, joint pain, joint swelling, myalgias, morning stiffness and myalgias. Negative for muscle weakness and muscle tenderness.  Skin: Negative for color change, rash, hair loss, nodules/bumps, skin tightness, ulcers and sensitivity to sunlight.  Allergic/Immunologic: Negative for susceptible to infections.  Neurological: Negative for dizziness,  fainting, memory loss and night sweats.  Hematological: Negative for swollen glands.  Psychiatric/Behavioral: Positive for depressed mood and sleep disturbance. The patient is nervous/anxious.     PMFS History:  Patient Active Problem List   Diagnosis Date Noted  . Former smoker 04/18/2017  . Primary insomnia 04/14/2017  . 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 History of positive PPD treated with INH  04/14/2017  . Abscess of left olecranon bursa 11/07/2016  . High risk medication use 11/07/2016  . Cervical disc disease 11/07/2016  . Primary osteoarthritis of both hands 11/07/2016  . Primary osteoarthritis of both knees 11/07/2016  . Primary osteoarthritis of both shoulders 11/07/2016  . Precordial pain 01/22/2016  . Pain in the chest   . Hyperlipidemia 01/20/2016  . Depression with anxiety 01/20/2016  . GERD (gastroesophageal reflux disease) 01/20/2016  . Type 2 diabetes mellitus ([place]) 01/20/2016  . Chest pain at rest 09/22/2014  . Tobacco use 09/22/2014  . Morbid obesity ([place]) 09/22/2014  . Rheumatoid arthritis ([place]) 09/22/2014  . Family history of heart disease 09/22/2014  . Chest pain 09/22/2014    Past Medical History:  Diagnosis Date  . Angina    panic attacks- cardiac tests 12  . Anxiety   . Arthritis    RA  . 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 COPD (chronic obstructive pulmonary disease) ([place])   . Coronary artery disease    heart cath in 1999, 2005 and 2012 with patent coronary arteries  . Depression   . Diabetes mellitus type 2, noninsulin dependent ([place])   . GERD (gastroesophageal reflux disease)   .  H/O hiatal hernia   . Rheumatoid arthritis ([place])   . Tuberculosis    hx tx    Family History  Problem Relation Age of Onset  . Heart disease Mother   . COPD Mother   . Heart attack Father        sudden death  . Heart attack Sister 62  . Kidney disease Sister   . Heart disease Sister   . Heart attack Brother   . Heart disease  Brother   . Diabetes Brother   . Heart attack Brother   . Heart disease Brother 33       CABG  . Heart attack Brother 45   Past Surgical History:  Procedure Laterality Date  . ANTERIOR CERVICAL DECOMP/DISCECTOMY FUSION  01/21/2012   Procedure: ANTERIOR CERVICAL DECOMPRESSION/DISCECTOMY FUSION 2 LEVELS;  Surgeon: Erline Levine, MD;  Location: [facility];  Service: Neurosurgery;  Laterality: N/A;  Cervical Six-Seven, Cervical Seven-Thoracic One, Anterior Cervical Decompression with Fusion Interbody Prothesis Plating and Bonegraft possible posterior Cervical Seven-Thoracic One Foraminotomy  . CARDIAC CATHETERIZATION  [phone]   normal coronary arteries  . CARDIAC CATHETERIZATION N/A 01/22/2016   Procedure: Left Heart Cath and Coronary Angiography;  Surgeon: Burnell Blanks, MD;  Location: [facility];  Service: Cardiovascular;  Laterality: N/A;  . CARPAL TUNNEL RELEASE Bilateral   . CERVICAL DISC SURGERY  04  . CHOLECYSTECTOMY N/A 10/26/2014   Procedure: LAPAROSCOPIC CHOLECYSTECTOMY;  Surgeon: Jamesetta So, MD;  Location: [facility];  Service: General;  Laterality: N/A;  . ESOPHAGOGASTRODUODENOSCOPY N/A 05/03/2016   Procedure: ESOPHAGOGASTRODUODENOSCOPY (EGD);  Surgeon: Rogene Houston, MD;  [facility];  Service: Endoscopy;  Laterality: N/A;  10:30  . HERNIA REPAIR Right 60's  . left elbow bursa removed Left   . TONSILLECTOMY  11   Social History   Social History Narrative  . Not on file     Objective: Vital Signs: BP 128/89 (BP Location: Left Arm, Patient Position: Sitting, Cuff Size: Normal)   Pulse 90   Resp 19   Ht 6\' 1"  (1.854 m)   Wt 292 lb (132.5 kg)   BMI 38.52 kg/m    Physical Exam  Constitutional: He is oriented to person, place, and time. He appears well-developed and well-nourished.  HENT:  Head: Normocephalic and atraumatic.  Eyes: Conjunctivae and EOM are normal. Pupils are equal, round, and reactive to light.  Neck: Normal range of  motion. Neck supple.  Cardiovascular: Normal rate, regular rhythm and normal heart sounds.  Pulmonary/Chest: Effort normal and breath sounds normal.  Abdominal: Soft. Bowel sounds are normal.  Neurological: He is alert and oriented to person, place, and time.  Skin: Skin is warm and dry. Capillary refill takes less than 2 seconds.  Psychiatric: He has a normal mood and affect. His behavior is normal.  Nursing note and vitals reviewed.    Musculoskeletal Exam: C-spine limited range of motion and thoracic lumbar spine good range of motion. Shoulder joints, elbow joints, wrist joints, MCPs and PIPs with good range of motion. He has some tenderness over his right wrist joint without any warmth swelling or synovitis. He had MCP thickening but no synovitis was noted. PIP/DIP thickening was noted. Knee joints are good range of motion without any warmth swelling or effusion. He has no discomfort over MCPs or PIPs.  CDAI Exam: CDAI Homunculus Exam:   Tenderness:  RUE: wrist  Joint Counts:  CDAI Tender Joint count: 1 CDAI Swollen Joint count: 0  Global Assessments:  Patient Global Assessment: 3 Provider Global Assessment: 1  CDAI Calculated Score: 5    Investigation: No additional findings. CBC Latest Ref Rng & Units 08/11/2017 04/18/2017 01/10/2017  WBC 3.4 - 10.8 x10E3/uL 8.0 7.1 9.3  Hemoglobin 13.0 - 17.7 g/dL 13.1 13.2 13.7  Hematocrit 37.5 - 51.0 % 41.1 40.1 40.8  Platelets 150 - 379 x10E3/uL 221 233 230   CMP Latest Ref Rng & Units 07/21/2017 04/18/2017 01/10/2017  Glucose 65 - 99 mg/dL 113(H) 127(H) 111(H)  BUN 6 - 24 mg/dL 17 19 16   Creatinine 0.76 - 1.27 mg/dL 1.02 1.00 1.12  Sodium 134 - 144 mmol/L 140 142 143  Potassium 3.5 - 5.2 mmol/L 4.9 4.9 4.9  Chloride 96 - 106 mmol/L 98 108 102  CO2 20 - 29 mmol/L 27 24 28   Calcium 8.7 - 10.2 mg/dL 9.4 9.2 9.6  Total Protein 6.0 - 8.5 g/dL 7.0 6.4 7.1  Total Bilirubin 0.0 - 1.2 mg/dL 1.2 1.1 0.8  Alkaline Phos 39 - 117 IU/L 53 43 54   AST 0 - 40 IU/L 20 16 17   ALT 0 - 44 IU/L 17 16 20     Imaging: Xr Foot 2 Views Left  Result Date: 10/06/2017 First MTP, all PIP DIP minimal narrowing was noted. No intertarsal joint space narrowing was noted. No erosive changes were noted. A small posterior calcaneal spur was noted. Impression: These findings are consistent with osteoarthritis of the foot.  Xr Foot 2 Views Right  Result Date: 10/06/2017 First MTP, all PIP DIP minimal narrowing was noted. No intertarsal joint space narrowing was noted. No erosive changes were noted. A small posterior calcaneal spur was noted. Mild dorsal spurring was noted. Impression: These findings are consistent with osteoarthritis of the foot.  Xr Hand 2 View Left  Result Date: 10/06/2017 First second,third and fifth MCP joint narrowing was noted. Juxta articular osteopenia was noted. PIP/DIP and CMC narrowing was noted. Intercarpal and radiocarpal point narrowing was noted. No erosive changes were noted. Impression: These findings are consistent with rheumatoid arthritis and osteoarthritis overlap.  Xr Hand 2 View Right  Result Date: 10/06/2017 First second and third MCP joint narrowing was noted. Juxta articular osteopenia was noted. PIP/DIP and CMC narrowing was noted. Intercarpal and radiocarpal point narrowing was noted. No erosive changes were noted. Impression: These findings are consistent with rheumatoid arthritis and osteoarthritis overlap.   Speciality Comments: No specialty comments available.    Procedures:  No procedures performed Allergies: Patient has no known allergies.   Assessment / Plan:     Visit Diagnoses: Rheumatoid arthritis involving multiple sites with positive rheumatoid factor (HCC) - +RF,+anti-CCP,+ANA, with nodulosis. -He complains of increase I'll challenge as discomfort and intermittent swelling. No synovitis was noted on examination. Plan: XR Foot 2 Views Right, XR Foot 2 Views Left. X-rays were consistent with  mild osteoarthritis no erosive changes were noted.  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 Nodulosis is stable.  High risk medication use - Methotrexate 8 tablets by mouth every week, folic acid 2 mg by mouth daily. His labs are stable. We will continue to monitor labs every 3 months.  History of positive PPD treated with INH   Bilateral hand pain - Plan: XR Hand 2 View Right, XR Hand 2 View Left  Primary osteoarthritis of both hands: Joint protection and muscle strengthening discussed.  Primary osteoarthritis of both shoulders: He has intermittent discomfort in  his right shoulder.  Primary osteoarthritis of both knees: He has chronic mild discomfort.  Cervical disc disease - s/p fusion   Other medical problems are listed as follows:  Former smoker  Primary insomnia - Manageable with medications.  History of gastroesophageal reflux (GERD) - h/o esophageal ulcer  History of obesity  History of depression - with anxiety  History of diabetes mellitus  History of hyperlipidemia    Orders: Orders Placed This Encounter  Procedures  . XR Hand 2 View Right  . XR Hand 2 View Left  . XR Foot 2 Views Right  . XR Foot 2 Views Left   No orders of the defined types were placed in this encounter.   Face-to-face time spent with patient was 30 minutes. Greater than 50% of time was spent in counseling and coordination of care.  Follow-Up Instructions: Return in about 5 months (around 03/06/2018) for Rheumatoid arthritis.   Bo Merino, MD  Note - This record has been created using Editor, commissioning.  Chart creation errors have been sought, but may not always  have been located. Such creation errors do not reflect on  the standard of medical care.

## 2017-10-06 ENCOUNTER — Ambulatory Visit (INDEPENDENT_AMBULATORY_CARE_PROVIDER_SITE_OTHER): Payer: Self-pay

## 2017-10-06 ENCOUNTER — Ambulatory Visit (INDEPENDENT_AMBULATORY_CARE_PROVIDER_SITE_OTHER): Payer: BLUE CROSS/BLUE SHIELD | Admitting: Rheumatology

## 2017-10-06 ENCOUNTER — Encounter (INDEPENDENT_AMBULATORY_CARE_PROVIDER_SITE_OTHER): Payer: Self-pay

## 2017-10-06 ENCOUNTER — Encounter: Payer: Self-pay | Admitting: *Deleted

## 2017-10-06 ENCOUNTER — Encounter: Payer: Self-pay | Admitting: Rheumatology

## 2017-10-06 VITALS — BP 128/89 | HR 90 | Resp 19 | Ht 73.0 in | Wt 292.0 lb

## 2017-10-06 DIAGNOSIS — M5090 Cervical disc disorder, unspecified, unspecified cervical region: Secondary | ICD-10-CM

## 2017-10-06 DIAGNOSIS — J019 Acute sinusitis, unspecified: Secondary | ICD-10-CM | POA: Diagnosis not present

## 2017-10-06 DIAGNOSIS — M19042 Primary osteoarthritis, left hand: Secondary | ICD-10-CM

## 2017-10-06 DIAGNOSIS — M79641 Pain in right hand: Secondary | ICD-10-CM

## 2017-10-06 DIAGNOSIS — M19012 Primary osteoarthritis, left shoulder: Secondary | ICD-10-CM | POA: Diagnosis not present

## 2017-10-06 DIAGNOSIS — F5101 Primary insomnia: Secondary | ICD-10-CM | POA: Diagnosis not present

## 2017-10-06 DIAGNOSIS — M79642 Pain in left hand: Secondary | ICD-10-CM

## 2017-10-06 DIAGNOSIS — M170 Bilateral primary osteoarthritis of knee: Secondary | ICD-10-CM | POA: Diagnosis not present

## 2017-10-06 DIAGNOSIS — M0630 Rheumatoid nodule, unspecified site: Secondary | ICD-10-CM

## 2017-10-06 DIAGNOSIS — M0579 Rheumatoid arthritis with rheumatoid factor of multiple sites without organ or systems involvement: Secondary | ICD-10-CM

## 2017-10-06 DIAGNOSIS — Z79899 Other long term (current) drug therapy: Secondary | ICD-10-CM

## 2017-10-06 DIAGNOSIS — M19011 Primary osteoarthritis, right shoulder: Secondary | ICD-10-CM | POA: Diagnosis not present

## 2017-10-06 DIAGNOSIS — M19041 Primary osteoarthritis, right hand: Secondary | ICD-10-CM | POA: Diagnosis not present

## 2017-10-06 DIAGNOSIS — Z8659 Personal history of other mental and behavioral disorders: Secondary | ICD-10-CM

## 2017-10-06 DIAGNOSIS — J301 Allergic rhinitis due to pollen: Secondary | ICD-10-CM | POA: Diagnosis not present

## 2017-10-06 DIAGNOSIS — Z87891 Personal history of nicotine dependence: Secondary | ICD-10-CM

## 2017-10-06 DIAGNOSIS — Z6837 Body mass index (BMI) 37.0-37.9, adult: Secondary | ICD-10-CM | POA: Diagnosis not present

## 2017-10-06 DIAGNOSIS — R7611 Nonspecific reaction to tuberculin skin test without active tuberculosis: Secondary | ICD-10-CM | POA: Diagnosis not present

## 2017-10-06 DIAGNOSIS — Z9289 Personal history of other medical treatment: Secondary | ICD-10-CM

## 2017-10-06 DIAGNOSIS — Z8719 Personal history of other diseases of the digestive system: Secondary | ICD-10-CM | POA: Diagnosis not present

## 2017-10-06 DIAGNOSIS — Z8639 Personal history of other endocrine, nutritional and metabolic disease: Secondary | ICD-10-CM

## 2017-10-06 DIAGNOSIS — Z1389 Encounter for screening for other disorder: Secondary | ICD-10-CM | POA: Diagnosis not present

## 2017-10-06 NOTE — Patient Instructions (Signed)
Standing Labs We placed an order today for your standing lab work.    Please come back and get your standing labs in February and every 3 months  We have open lab Monday through Friday from 8:30-11:30 AM and 1:30-4 PM at the office of Dr. Lady Wisham.   The office is located at [address], [address], [place], [place] [address] No appointment is necessary.   Labs are drawn by [facility].  You may receive a bill from [facility] for your lab work. If you have any questions regarding directions or hours of operation,  please call [phone].    

## 2017-10-09 DIAGNOSIS — S01531A Puncture wound without foreign body of lip, initial encounter: Secondary | ICD-10-CM | POA: Diagnosis not present

## 2017-10-09 DIAGNOSIS — Z79899 Other long term (current) drug therapy: Secondary | ICD-10-CM | POA: Diagnosis not present

## 2017-10-09 DIAGNOSIS — K1370 Unspecified lesions of oral mucosa: Secondary | ICD-10-CM | POA: Diagnosis not present

## 2017-10-24 ENCOUNTER — Encounter: Payer: Self-pay | Admitting: Cardiology

## 2017-10-24 NOTE — Progress Notes (Signed)
Cardiology Office Note  Date: 10/27/2017   ID: ERUBIEL [name], DOB [birth year], MRN [id number]  PCP: Redmond School, MD  Consulting Cardiologist: Rozann Lesches, MD   Chief Complaint  Patient presents with  . History of chest pain    History of Present Illness: Dustin [name] is a 57 y.o. male referred for cardiology consultation by Dr. Gerarda Fraction for the evaluation of possible arrhythmia.  Limited records were provided.  The patient tells me that he had a routine visit back in December 2018 and was told that his heart rate was in the "30s" based on pulse oximeter check.  This was however followed up by an ECG that the patient recalls showed a normal heart rate but that something was "out of rhythm."  Unfortunately, I do not have this tracing for review this time.  He does not report any dizziness or syncope.  He does state that he has a "skipped" beat from time to time, and this has been a chronic problem.  Patient has undergone multiple cardiac assessments over the years by various providers for recurrent chest pain and family history of premature CAD.  Most recently he underwent cardiac catheterization with Dr. Angelena Form in May 2017 with finding of normal coronary arteries.  He tells me in retrospect that he has had less chest pain since having his gallbladder out a few years ago.  We went over his current medications which include aspirin and Lipitor.  I personally reviewed his ECG today which shows normal sinus rhythm.  Past Medical History:  Diagnosis Date  . Anxiety   . COPD (chronic obstructive pulmonary disease) ([place])   . Depression   . Essential hypertension   . GERD (gastroesophageal reflux disease)   . H/O hiatal hernia   . History of cardiac catheterization    Normal coronaries May 2017  . History of ITP    Childhood  . Osteoarthritis   . Type 2 diabetes mellitus ([place])     Past Surgical History:  Procedure Laterality Date  . ANTERIOR CERVICAL DECOMP/DISCECTOMY  FUSION  01/21/2012   Procedure: ANTERIOR CERVICAL DECOMPRESSION/DISCECTOMY FUSION 2 LEVELS;  Surgeon: Erline Levine, MD;  Location: [facility];  Service: Neurosurgery;  Laterality: N/A;  Cervical Six-Seven, Cervical Seven-Thoracic One, Anterior Cervical Decompression with Fusion Interbody Prothesis Plating and Bonegraft possible posterior Cervical Seven-Thoracic One Foraminotomy  . CARDIAC CATHETERIZATION  [phone]   normal coronary arteries  . CARDIAC CATHETERIZATION N/A 01/22/2016   Procedure: Left Heart Cath and Coronary Angiography;  Surgeon: Burnell Blanks, MD;  Location: [facility];  Service: Cardiovascular;  Laterality: N/A;  . CARPAL TUNNEL RELEASE Bilateral   . CERVICAL DISC SURGERY  04  . CHOLECYSTECTOMY N/A 10/26/2014   Procedure: LAPAROSCOPIC CHOLECYSTECTOMY;  Surgeon: Jamesetta So, MD;  Location: [facility];  Service: General;  Laterality: N/A;  . ESOPHAGOGASTRODUODENOSCOPY N/A 05/03/2016   Procedure: ESOPHAGOGASTRODUODENOSCOPY (EGD);  Surgeon: Rogene Houston, MD;  [facility];  Service: Endoscopy;  Laterality: N/A;  10:30  . HERNIA REPAIR Right 60's  . Left elbow bursa removed    . TONSILLECTOMY  72    Current Outpatient Medications  Medication Sig Dispense Refill  . acetaminophen (TYLENOL) 500 MG tablet Take 500 mg by mouth every 6 (six) hours as needed for mild pain.    Marland Kitchen ALPRAZolam (XANAX) 1 MG tablet Take 1 mg by mouth 3 (three) times daily as needed for anxiety.    Marland Kitchen aspirin 81 MG tablet Take 81  mg by mouth daily.    Marland Kitchen atorvastatin (LIPITOR) 10 MG tablet Take 1 tablet (10 mg total) by mouth daily. 30 tablet 3  . folic acid (FOLVITE) 1 MG tablet Take 1 tablet (1 mg total) by mouth 2 (two) times daily. 180 tablet 4  . metFORMIN (GLUCOPHAGE) 500 MG tablet Take 500 mg by mouth 2 (two) times daily with a meal.    . methotrexate (RHEUMATREX) 2.5 MG tablet TAKE EIGHT TABLETS BY MOUTH ONCE EVERY WEEK - caution: chemotherapy. protect from light 96 tablet  0  . pantoprazole (PROTONIX) 40 MG tablet Take 40 mg by mouth daily.    Marland Kitchen zolpidem (AMBIEN) 10 MG tablet Take 10 mg by mouth at bedtime as needed for sleep. Reported on 01/20/2016     No current facility-administered medications for this visit.    Allergies:  Patient has no known allergies.   Social History: The patient  reports that he quit smoking about 5 weeks ago. His smoking use included cigarettes. He started smoking about 43 years ago. He has a 15.00 pack-year smoking history. he has never used smokeless tobacco. He reports that he does not drink alcohol or use drugs.   Family History: The patient's family history includes COPD in his mother; Diabetes in his brother; Heart attack in his brother, brother, and father; Heart attack (age of onset: 47) in his brother; Heart attack (age of onset: 74) in his sister; Heart disease in his brother, mother, and sister; Heart disease (age of onset: 65) in his brother; Kidney disease in his sister.   ROS:  Please see the history of present illness. Otherwise, complete review of systems is positive for intermittent "twinges" in his chest.  All other systems are reviewed and negative.   Physical Exam: VS:  BP 122/84   Pulse 75   Ht 6\' 1"  (1.854 m)   Wt 284 lb (128.8 kg)   SpO2 92%   BMI 37.47 kg/m , BMI Body mass index is 37.47 kg/m.  Wt Readings from Last 3 Encounters:  10/27/17 284 lb (128.8 kg)  10/06/17 292 lb (132.5 kg)  04/18/17 296 lb (134.3 kg)    General: Obese male, appears comfortable at rest. HEENT: Conjunctiva and lids normal, oropharynx clear. Neck: Supple, no elevated JVP or carotid bruits, no thyromegaly. Lungs: Clear to auscultation, nonlabored breathing at rest. Cardiac: Regular rate and rhythm, no S3 or significant systolic murmur, no pericardial rub. Abdomen: Soft, nontender, bowel sounds present. Extremities: No pitting edema, distal pulses 2+. Skin: Warm and dry. Musculoskeletal: No kyphosis. Neuropsychiatric: Alert  and oriented x3, affect grossly appropriate.  ECG: I personally reviewed the tracing from 01/20/2016 which showed normal sinus rhythm.  Recent Labwork: 07/21/2017: ALT 17; AST 20; BUN 17; Creatinine, Ser 1.02; Potassium 4.9; Sodium 140 08/11/2017: Hemoglobin 13.1; Platelets 221     Component Value Date/Time   CHOL 109 01/22/2016 1250   TRIG 106 01/22/2016 1250   HDL 25 (L) 01/22/2016 1250   CHOLHDL 4.4 01/22/2016 1250   VLDL 21 01/22/2016 1250   LDLCALC 63 01/22/2016 1250  December 2018: Hemoglobin A1c 6.2  Other Studies Reviewed Today:  Cardiac catheterization 01/22/2016: 1. No angiographic evidence of CAD 2. Normal LV systolic function 3. Non-cardiac chest pain  Chest x-ray 01/23/2016: FINDINGS: Lungs are adequately inflated without consolidation or effusion. Cardiomediastinal silhouette is within normal fusion hardware is intact and unchanged over the cervical-thoracic spine.  IMPRESSION: No active cardiopulmonary disease.  VQ scan 01/23/2016: FINDINGS: Ventilation: Somewhat heterogeneous distribution particularly  in the apices right worse than left.  Perfusion: Matching apical perfusion defects right greater than left. Otherwise heterogeneous distribution of radiopharmaceutical. No unmatched segmental or subsegmental perfusion defects.  IMPRESSION: Low likelihood ratio for pulmonary embolism.  Assessment and Plan:  1.  Possible bradycardia and arrhythmia based on limited information.  ECG from [facility] from the visit in question has been requested.  Heart rate today is normal as is his ECG.  It may be that he was having some ectopy at the time of his assessment in December 2018 that was resulting in under counting of heart rate by pulse oximeter.  2.  History of recurrent chest pain, atypical in description most recently.  He has undergone reassuring cardiac assessment over the years including normal coronary arteries by cardiac catheterization in May 2017.  He does have  a family history of premature CAD.  Would continue with risk factor modification including diet and exercise, aspirin and statin therapy.  3.  Mixed hyperlipidemia, continues on Lipitor.  4.  Type 2 diabetes mellitus, on Glucophage, followed by Dr. Gerarda Fraction.  Recent hemoglobin A1c 6.2.  Current medicines were reviewed with the patient today.   Orders Placed This Encounter  Procedures  . EKG 12-Lead    Disposition: Review outside ECG with further recommendations to follow.  Signed, Satira Sark, MD, [facility] 10/27/2017 9:08 AM    [facility] [place], [place], [place] [address] Phone: [phone]; Fax: [phone]

## 2017-10-27 ENCOUNTER — Telehealth: Payer: Self-pay | Admitting: Rheumatology

## 2017-10-27 ENCOUNTER — Encounter: Payer: Self-pay | Admitting: Cardiology

## 2017-10-27 ENCOUNTER — Encounter: Payer: Self-pay | Admitting: *Deleted

## 2017-10-27 ENCOUNTER — Ambulatory Visit (INDEPENDENT_AMBULATORY_CARE_PROVIDER_SITE_OTHER): Payer: BLUE CROSS/BLUE SHIELD | Admitting: Cardiology

## 2017-10-27 VITALS — BP 122/84 | HR 75 | Ht 73.0 in | Wt 284.0 lb

## 2017-10-27 DIAGNOSIS — Z79899 Other long term (current) drug therapy: Secondary | ICD-10-CM | POA: Diagnosis not present

## 2017-10-27 DIAGNOSIS — E782 Mixed hyperlipidemia: Secondary | ICD-10-CM | POA: Diagnosis not present

## 2017-10-27 DIAGNOSIS — Z8249 Family history of ischemic heart disease and other diseases of the circulatory system: Secondary | ICD-10-CM

## 2017-10-27 DIAGNOSIS — Z87898 Personal history of other specified conditions: Secondary | ICD-10-CM | POA: Diagnosis not present

## 2017-10-27 DIAGNOSIS — E119 Type 2 diabetes mellitus without complications: Secondary | ICD-10-CM

## 2017-10-27 DIAGNOSIS — Z794 Long term (current) use of insulin: Secondary | ICD-10-CM

## 2017-10-27 NOTE — Telephone Encounter (Signed)
Lab orders have been faxed. Patient is aware.

## 2017-10-27 NOTE — Patient Instructions (Addendum)
Medication Instructions:  Continue all current medications.  Labwork: none  Testing/Procedures: none  Follow-Up: To be determined - will call back after doctor reviews previous EKG.    Any Other Special Instructions Will Be Listed Below (If Applicable).  If you need a refill on your cardiac medications before your next appointment, please call your pharmacy.

## 2017-10-27 NOTE — Telephone Encounter (Signed)
Patient called stating that he forgot to call on Friday to request his lab work order be sent to [facility] on [address] in [place].  Patient states he has a doctor's appointment this morning at 9:00 am and plans to go after the visit.  Patient was told that his request will be sent back urgent but to call [facility] before he drives over due to the short notice.

## 2017-10-28 LAB — CMP14+EGFR
A/G RATIO: 1.6 (ref 1.2–2.2)
ALT: 20 IU/L (ref 0–44)
AST: 19 IU/L (ref 0–40)
Albumin: 4.5 g/dL (ref 3.5–5.5)
Alkaline Phosphatase: 62 IU/L (ref 39–117)
BUN/Creatinine Ratio: 21 — ABNORMAL HIGH (ref 9–20)
BUN: 22 mg/dL (ref 6–24)
Bilirubin Total: 0.9 mg/dL (ref 0.0–1.2)
CALCIUM: 9.5 mg/dL (ref 8.7–10.2)
CO2: 27 mmol/L (ref 20–29)
CREATININE: 1.07 mg/dL (ref 0.76–1.27)
Chloride: 99 mmol/L (ref 96–106)
GFR, EST AFRICAN AMERICAN: 88 mL/min/{1.73_m2} (ref >59)
GFR, EST NON AFRICAN AMERICAN: 76 mL/min/{1.73_m2} (ref >59)
Globulin, Total: 2.8 g/dL (ref 1.5–4.5)
Glucose: 111 mg/dL — ABNORMAL HIGH (ref 65–99)
POTASSIUM: 6.1 mmol/L — AB (ref 3.5–5.2)
Sodium: 140 mmol/L (ref 134–144)
TOTAL PROTEIN: 7.3 g/dL (ref 6.0–8.5)

## 2017-10-28 LAB — CBC WITH DIFFERENTIAL/PLATELET
Basophils Absolute: 0 10*3/uL (ref 0.0–0.2)
Basos: 0 %
EOS (ABSOLUTE): 0.1 10*3/uL (ref 0.0–0.4)
EOS: 2 %
HEMATOCRIT: 43 % (ref 37.5–51.0)
Hemoglobin: 13.8 g/dL (ref 13.0–17.7)
IMMATURE GRANULOCYTES: 0 %
Immature Grans (Abs): 0 10*3/uL (ref 0.0–0.1)
Lymphocytes Absolute: 1.9 10*3/uL (ref 0.7–3.1)
Lymphs: 25 %
MCH: 31.4 pg (ref 26.6–33.0)
MCHC: 32.1 g/dL (ref 31.5–35.7)
MCV: 98 fL — ABNORMAL HIGH (ref 79–97)
MONOS ABS: 0.7 10*3/uL (ref 0.1–0.9)
Monocytes: 9 %
NEUTROS PCT: 64 %
Neutrophils Absolute: 4.7 10*3/uL (ref 1.4–7.0)
PLATELETS: 290 10*3/uL (ref 150–379)
RBC: 4.39 x10E6/uL (ref 4.14–5.80)
RDW: 14 % (ref 12.3–15.4)
WBC: 7.3 10*3/uL (ref 3.4–10.8)

## 2017-10-29 DIAGNOSIS — E875 Hyperkalemia: Secondary | ICD-10-CM | POA: Diagnosis not present

## 2017-11-10 DIAGNOSIS — E875 Hyperkalemia: Secondary | ICD-10-CM | POA: Diagnosis not present

## 2017-11-11 DIAGNOSIS — E6609 Other obesity due to excess calories: Secondary | ICD-10-CM | POA: Diagnosis not present

## 2017-11-11 DIAGNOSIS — Z6836 Body mass index (BMI) 36.0-36.9, adult: Secondary | ICD-10-CM | POA: Diagnosis not present

## 2017-11-11 DIAGNOSIS — E875 Hyperkalemia: Secondary | ICD-10-CM | POA: Diagnosis not present

## 2017-11-11 DIAGNOSIS — Z1389 Encounter for screening for other disorder: Secondary | ICD-10-CM | POA: Diagnosis not present

## 2017-11-17 ENCOUNTER — Other Ambulatory Visit: Payer: Self-pay | Admitting: Rheumatology

## 2017-11-17 NOTE — Telephone Encounter (Signed)
Last Visit: 10/06/17 Next visit: 03/09/18 Labs: 10/27/17 Potassium is very elevated.PCP and patient aware  Okay to refill per Dr. Estanislado Pandy

## 2017-12-12 DIAGNOSIS — F419 Anxiety disorder, unspecified: Secondary | ICD-10-CM | POA: Diagnosis not present

## 2017-12-12 DIAGNOSIS — E119 Type 2 diabetes mellitus without complications: Secondary | ICD-10-CM | POA: Diagnosis not present

## 2017-12-12 DIAGNOSIS — I10 Essential (primary) hypertension: Secondary | ICD-10-CM | POA: Diagnosis not present

## 2017-12-12 DIAGNOSIS — Z0001 Encounter for general adult medical examination with abnormal findings: Secondary | ICD-10-CM | POA: Diagnosis not present

## 2017-12-12 DIAGNOSIS — E782 Mixed hyperlipidemia: Secondary | ICD-10-CM | POA: Diagnosis not present

## 2017-12-12 DIAGNOSIS — M1991 Primary osteoarthritis, unspecified site: Secondary | ICD-10-CM | POA: Diagnosis not present

## 2017-12-12 DIAGNOSIS — Z1389 Encounter for screening for other disorder: Secondary | ICD-10-CM | POA: Diagnosis not present

## 2017-12-12 DIAGNOSIS — K589 Irritable bowel syndrome without diarrhea: Secondary | ICD-10-CM | POA: Diagnosis not present

## 2017-12-12 DIAGNOSIS — Z6837 Body mass index (BMI) 37.0-37.9, adult: Secondary | ICD-10-CM | POA: Diagnosis not present

## 2017-12-12 DIAGNOSIS — K219 Gastro-esophageal reflux disease without esophagitis: Secondary | ICD-10-CM | POA: Diagnosis not present

## 2017-12-15 DIAGNOSIS — R809 Proteinuria, unspecified: Secondary | ICD-10-CM | POA: Diagnosis not present

## 2017-12-15 DIAGNOSIS — I10 Essential (primary) hypertension: Secondary | ICD-10-CM | POA: Diagnosis not present

## 2017-12-15 DIAGNOSIS — Z79899 Other long term (current) drug therapy: Secondary | ICD-10-CM | POA: Diagnosis not present

## 2017-12-15 DIAGNOSIS — Z1389 Encounter for screening for other disorder: Secondary | ICD-10-CM | POA: Diagnosis not present

## 2017-12-15 DIAGNOSIS — E875 Hyperkalemia: Secondary | ICD-10-CM | POA: Diagnosis not present

## 2017-12-15 DIAGNOSIS — Z6837 Body mass index (BMI) 37.0-37.9, adult: Secondary | ICD-10-CM | POA: Diagnosis not present

## 2018-01-05 ENCOUNTER — Other Ambulatory Visit: Payer: Self-pay | Admitting: *Deleted

## 2018-01-05 ENCOUNTER — Telehealth: Payer: Self-pay | Admitting: Rheumatology

## 2018-01-05 DIAGNOSIS — Z79899 Other long term (current) drug therapy: Secondary | ICD-10-CM | POA: Diagnosis not present

## 2018-01-05 NOTE — Telephone Encounter (Signed)
[facility] in [place] calling for orders to be released. Patient waiting.

## 2018-01-05 NOTE — Telephone Encounter (Signed)
Order's released

## 2018-01-06 ENCOUNTER — Telehealth: Payer: Self-pay | Admitting: Rheumatology

## 2018-01-06 LAB — CBC WITH DIFFERENTIAL/PLATELET
BASOS ABS: 0 10*3/uL (ref 0.0–0.2)
Basos: 1 %
EOS (ABSOLUTE): 0.2 10*3/uL (ref 0.0–0.4)
Eos: 3 %
Hematocrit: 41 % (ref 37.5–51.0)
Hemoglobin: 13.7 g/dL (ref 13.0–17.7)
IMMATURE GRANS (ABS): 0 10*3/uL (ref 0.0–0.1)
IMMATURE GRANULOCYTES: 0 %
LYMPHS: 22 %
Lymphocytes Absolute: 1.5 10*3/uL (ref 0.7–3.1)
MCH: 32.9 pg (ref 26.6–33.0)
MCHC: 33.4 g/dL (ref 31.5–35.7)
MCV: 98 fL — ABNORMAL HIGH (ref 79–97)
MONOS ABS: 0.6 10*3/uL (ref 0.1–0.9)
Monocytes: 9 %
NEUTROS PCT: 65 %
Neutrophils Absolute: 4.5 10*3/uL (ref 1.4–7.0)
PLATELETS: 267 10*3/uL (ref 150–379)
RBC: 4.17 x10E6/uL (ref 4.14–5.80)
RDW: 14.2 % (ref 12.3–15.4)
WBC: 6.9 10*3/uL (ref 3.4–10.8)

## 2018-01-06 LAB — CMP14+EGFR
ALT: 22 IU/L (ref 0–44)
AST: 24 IU/L (ref 0–40)
Albumin/Globulin Ratio: 1.8 (ref 1.2–2.2)
Albumin: 4.4 g/dL (ref 3.5–5.5)
Alkaline Phosphatase: 49 IU/L (ref 39–117)
BILIRUBIN TOTAL: 0.9 mg/dL (ref 0.0–1.2)
BUN/Creatinine Ratio: 20 (ref 9–20)
BUN: 18 mg/dL (ref 6–24)
CALCIUM: 9.4 mg/dL (ref 8.7–10.2)
CHLORIDE: 99 mmol/L (ref 96–106)
CO2: 26 mmol/L (ref 20–29)
Creatinine, Ser: 0.9 mg/dL (ref 0.76–1.27)
GFR, EST AFRICAN AMERICAN: 108 mL/min/{1.73_m2} (ref >59)
GFR, EST NON AFRICAN AMERICAN: 94 mL/min/{1.73_m2} (ref >59)
GLUCOSE: 118 mg/dL — AB (ref 65–99)
Globulin, Total: 2.4 g/dL (ref 1.5–4.5)
POTASSIUM: 4.6 mmol/L (ref 3.5–5.2)
Sodium: 138 mmol/L (ref 134–144)
TOTAL PROTEIN: 6.8 g/dL (ref 6.0–8.5)

## 2018-01-06 NOTE — Telephone Encounter (Signed)
Patient has been advised of lab results.  °

## 2018-01-06 NOTE — Telephone Encounter (Signed)
Patient left a voicemail requesting a return call regarding his lab results.

## 2018-01-26 ENCOUNTER — Other Ambulatory Visit (HOSPITAL_COMMUNITY): Payer: Self-pay | Admitting: Physician Assistant

## 2018-01-26 ENCOUNTER — Ambulatory Visit (HOSPITAL_COMMUNITY)
Admission: RE | Admit: 2018-01-26 | Discharge: 2018-01-26 | Disposition: A | Discharge status: Home / Self Care | Payer: BLUE CROSS/BLUE SHIELD | Source: Ambulatory Visit | Attending: Physician Assistant | Admitting: Physician Assistant

## 2018-01-26 DIAGNOSIS — E6609 Other obesity due to excess calories: Secondary | ICD-10-CM | POA: Diagnosis not present

## 2018-01-26 DIAGNOSIS — R918 Other nonspecific abnormal finding of lung field: Secondary | ICD-10-CM | POA: Diagnosis not present

## 2018-01-26 DIAGNOSIS — M5134 Other intervertebral disc degeneration, thoracic region: Secondary | ICD-10-CM | POA: Diagnosis not present

## 2018-01-26 DIAGNOSIS — R0602 Shortness of breath: Secondary | ICD-10-CM | POA: Diagnosis not present

## 2018-01-26 DIAGNOSIS — M546 Pain in thoracic spine: Principal | ICD-10-CM

## 2018-01-26 DIAGNOSIS — Z87891 Personal history of nicotine dependence: Secondary | ICD-10-CM | POA: Insufficient documentation

## 2018-01-26 DIAGNOSIS — G8929 Other chronic pain: Secondary | ICD-10-CM | POA: Diagnosis not present

## 2018-01-26 DIAGNOSIS — R5383 Other fatigue: Secondary | ICD-10-CM | POA: Diagnosis not present

## 2018-01-26 DIAGNOSIS — Z6836 Body mass index (BMI) 36.0-36.9, adult: Secondary | ICD-10-CM | POA: Diagnosis not present

## 2018-01-26 DIAGNOSIS — G4700 Insomnia, unspecified: Secondary | ICD-10-CM | POA: Diagnosis not present

## 2018-01-26 DIAGNOSIS — Z1389 Encounter for screening for other disorder: Secondary | ICD-10-CM | POA: Diagnosis not present

## 2018-01-27 DIAGNOSIS — E039 Hypothyroidism, unspecified: Secondary | ICD-10-CM | POA: Diagnosis not present

## 2018-02-07 ENCOUNTER — Other Ambulatory Visit: Payer: Self-pay | Admitting: Rheumatology

## 2018-02-10 NOTE — Telephone Encounter (Signed)
Last visit: 10/06/2017 Next visit: 03/09/2018 Labs: 01/05/2018 Glucose is mildly elevated. MCV stable. All other lab values are WNL.  Okay to refill per Dr. Estanislado Pandy.

## 2018-02-16 DIAGNOSIS — E039 Hypothyroidism, unspecified: Secondary | ICD-10-CM | POA: Diagnosis not present

## 2018-02-16 DIAGNOSIS — G4700 Insomnia, unspecified: Secondary | ICD-10-CM | POA: Diagnosis not present

## 2018-02-16 DIAGNOSIS — Z1389 Encounter for screening for other disorder: Secondary | ICD-10-CM | POA: Diagnosis not present

## 2018-02-16 DIAGNOSIS — Z6837 Body mass index (BMI) 37.0-37.9, adult: Secondary | ICD-10-CM | POA: Diagnosis not present

## 2018-02-16 DIAGNOSIS — E875 Hyperkalemia: Secondary | ICD-10-CM | POA: Diagnosis not present

## 2018-02-23 NOTE — Progress Notes (Signed)
Office Visit Note  Patient: Dustin [name]             Date of Birth: Jul 15, 1961           MRN: [id number]             PCP: Redmond School, MD Referring: Redmond School, MD Visit Date: 03/09/2018 Occupation: @GUAROCC @    Subjective: Medication management.   History of Present Illness: Dustin [name] is a 57 y.o. male with history of seropositive rheumatoid arthritis.  He denies any joint swelling.  He has been tolerating methotrexate 8 tablets/week without any problems.  He is noticing some popping sensation in his bilateral thumb recently.  He does have some discomfort in his shoulders off and on.  He and his knee joints are doing better.  He does have limited range of motion in his C-spine which is not causing much discomfort currently.  Activities of Daily Living:  Patient reports morning stiffness for 0 minute.   Patient Denies nocturnal pain.  Difficulty dressing/grooming: Denies Difficulty climbing stairs: Denies Difficulty getting out of chair: Reports Difficulty using hands for taps, buttons, cutlery, and/or writing: Denies   Review of Systems  Constitutional: Positive for fatigue. Negative for night sweats.  HENT: Negative for mouth sores, mouth dryness and nose dryness.   Eyes: Negative for redness and dryness.  Respiratory: Negative for shortness of breath and difficulty breathing.   Cardiovascular: Negative for chest pain, palpitations, hypertension, irregular heartbeat and swelling in legs/feet.  Gastrointestinal: Negative for constipation and diarrhea.  Endocrine: Negative for increased urination.  Musculoskeletal: Positive for arthralgias and joint pain. Negative for joint swelling, myalgias, muscle weakness, morning stiffness, muscle tenderness and myalgias.  Skin: Negative for color change, rash, hair loss, nodules/bumps, skin tightness, ulcers and sensitivity to sunlight.  Allergic/Immunologic: Negative for susceptible to infections.  Neurological: Negative  for dizziness, fainting, memory loss, night sweats and weakness ( ).  Hematological: Negative for swollen glands.  Psychiatric/Behavioral: Negative for depressed mood and sleep disturbance. The patient is not nervous/anxious.     PMFS History:  Patient Active Problem List   Diagnosis Date Noted  . Former smoker 04/18/2017  . Primary insomnia 04/14/2017  . 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 History of positive PPD treated with INH  04/14/2017  . Abscess of left olecranon bursa 11/07/2016  . High risk medication use 11/07/2016  . Cervical disc disease 11/07/2016  . Primary osteoarthritis of both hands 11/07/2016  . Primary osteoarthritis of both knees 11/07/2016  . Primary osteoarthritis of both shoulders 11/07/2016  . Precordial pain 01/22/2016  . Pain in the chest   . Hyperlipidemia 01/20/2016  . Depression with anxiety 01/20/2016  . GERD (gastroesophageal reflux disease) 01/20/2016  . Type 2 diabetes mellitus ([place]) 01/20/2016  . Chest pain at rest 09/22/2014  . Tobacco use 09/22/2014  . Morbid obesity ([place]) 09/22/2014  . Rheumatoid arthritis ([place]) 09/22/2014  . Family history of heart disease 09/22/2014  . Chest pain 09/22/2014    Past Medical History:  Diagnosis Date  . Anxiety   . COPD (chronic obstructive pulmonary disease) ([place])   . Depression   . Essential hypertension   . GERD (gastroesophageal reflux disease)   . H/O hiatal hernia   . History of cardiac catheterization    Normal coronaries May 2017  . History of ITP    Childhood  . Osteoarthritis   . Type 2 diabetes mellitus (HCC)     Family History  Problem Relation Age of  Onset  . Heart disease Mother   . COPD Mother   . Heart attack Father        Sudden death  . Heart attack Sister 67  . Kidney disease Sister   . Heart disease Sister   . Heart attack Brother   . Heart disease Brother   . Diabetes Brother   . Heart attack Brother   . Heart disease Brother 5       CABG  . Heart attack  Brother 72   Past Surgical History:  Procedure Laterality Date  . ANTERIOR CERVICAL DECOMP/DISCECTOMY FUSION  01/21/2012   Procedure: ANTERIOR CERVICAL DECOMPRESSION/DISCECTOMY FUSION 2 LEVELS;  Surgeon: Erline Levine, MD;  Location: [facility];  Service: Neurosurgery;  Laterality: N/A;  Cervical Six-Seven, Cervical Seven-Thoracic One, Anterior Cervical Decompression with Fusion Interbody Prothesis Plating and Bonegraft possible posterior Cervical Seven-Thoracic One Foraminotomy  . CARDIAC CATHETERIZATION  [phone]   normal coronary arteries  . CARDIAC CATHETERIZATION N/A 01/22/2016   Procedure: Left Heart Cath and Coronary Angiography;  Surgeon: Burnell Blanks, MD;  Location: [facility];  Service: Cardiovascular;  Laterality: N/A;  . CARPAL TUNNEL RELEASE Bilateral   . CERVICAL DISC SURGERY  04  . CHOLECYSTECTOMY N/A 10/26/2014   Procedure: LAPAROSCOPIC CHOLECYSTECTOMY;  Surgeon: Jamesetta So, MD;  Location: [facility];  Service: General;  Laterality: N/A;  . ESOPHAGOGASTRODUODENOSCOPY N/A 05/03/2016   Procedure: ESOPHAGOGASTRODUODENOSCOPY (EGD);  Surgeon: Rogene Houston, MD;  Location: [facility];  Service: Endoscopy;  Laterality: N/A;  10:30  . HERNIA REPAIR Right 60's  . Left elbow bursa removed    . TONSILLECTOMY  12   Social History   Social History Narrative  . Not on file     Objective: Vital Signs: BP 131/86 (BP Location: Left Arm, Patient Position: Sitting, Cuff Size: Large)   Pulse 73   Resp 17   Ht 6\' 1"  (1.854 m)   Wt 290 lb (131.5 kg)   BMI 38.26 kg/m    Physical Exam  Constitutional: He is oriented to person, place, and time. He appears well-developed and well-nourished.  HENT:  Head: Normocephalic and atraumatic.  Eyes: Pupils are equal, round, and reactive to light. Conjunctivae and EOM are normal.  Neck: Normal range of motion. Neck supple.  Cardiovascular: Normal rate, regular rhythm and normal heart sounds.  Pulmonary/Chest: Effort  normal and breath sounds normal.  Abdominal: Soft. Bowel sounds are normal.  Neurological: He is alert and oriented to person, place, and time.  Skin: Skin is warm and dry. Capillary refill takes less than 2 seconds.  Nodulosis noted over right thumb.  Psychiatric: He has a normal mood and affect. His behavior is normal.  Nursing note and vitals reviewed.    Musculoskeletal Exam: C-spine limited range of motion.  Shoulder joints elbow joints wrist joints were in good range of motion.  He has some thickening over bilateral first second and third MCP joint with no synovitis.  He has DIP PIP thickening.  Hip joints knee joints ankles were in good range of motion with no synovitis.  CDAI Exam: CDAI Homunculus Exam:   Joint Counts:  CDAI Tender Joint count: 0 CDAI Swollen Joint count: 0  Global Assessments:  Patient Global Assessment: 5 Provider Global Assessment: 2  CDAI Calculated Score: 7    Investigation: No additional findings. CBC Latest Ref Rng & Units 01/05/2018 10/27/2017 08/11/2017  WBC 3.4 - 10.8 x10E3/uL 6.9 7.3 8.0  Hemoglobin 13.0 - 17.7 g/dL 13.7  13.8 13.1  Hematocrit 37.5 - 51.0 % 41.0 43.0 41.1  Platelets 150 - 379 x10E3/uL 267 290 221   CMP Latest Ref Rng & Units 01/05/2018 10/27/2017 07/21/2017  Glucose 65 - 99 mg/dL 118(H) 111(H) 113(H)  BUN 6 - 24 mg/dL 18 22 17   Creatinine 0.76 - 1.27 mg/dL 0.90 1.07 1.02  Sodium 134 - 144 mmol/L 138 140 140  Potassium 3.5 - 5.2 mmol/L 4.6 6.1(H) 4.9  Chloride 96 - 106 mmol/L 99 99 98  CO2 20 - 29 mmol/L 26 27 27   Calcium 8.7 - 10.2 mg/dL 9.4 9.5 9.4  Total Protein 6.0 - 8.5 g/dL 6.8 7.3 7.0  Total Bilirubin 0.0 - 1.2 mg/dL 0.9 0.9 1.2  Alkaline Phos 39 - 117 IU/L 49 62 53  AST 0 - 40 IU/L 24 19 20   ALT 0 - 44 IU/L 22 20 17     Imaging: No results found.  Speciality Comments: No specialty comments available.    Procedures:  No procedures performed Allergies: Patient has no known allergies.   Assessment / Plan:      Visit Diagnoses: Rheumatoid arthritis involving multiple sites with positive rheumatoid factor (HCC) - +RF,+anti-CCP,+ANA, with nodulosis.  Patient has no active synovitis on examination today.  He has intermittent arthralgias.  He has been tolerating methotrexate well.  Rheumatoid nodulosis (HCC)-I offered injection for the rheumatoid nodules but he declined.  High risk medication use - Methotrexate 8 tablets by mouth every week, folic acid 2 mg by mouth daily.  His labs are stable we will continue to monitor his labs every 3 months.  History of positive PPD treated with INH   Primary osteoarthritis of both hands-joint protection muscle strengthening discussed.  Primary osteoarthritis of both shoulders-he has intermittent discomfort at night.  Primary osteoarthritis of both knees-currently he is not having much discomfort for it.  Weight loss will be helpful.  Cervical disc disease - s/p fusion .  He has limited range of motion and some residual left fourth and fifth finger numbness.  History of hyperlipidemia  Former smoker  Primary insomnia  History of depression  History of obesity-weight loss diet and exercise was discussed at length.  History of gastroesophageal reflux (GERD) - h/o esophageal ulcer  History of diabetes mellitus  History of hypothyroidism -new diagnosis.  Association of heart disease with rheumatoid arthritis was discussed. Need to monitor blood pressure, cholesterol, and to exercise 30-60 minutes on daily basis was discussed. Poor dental hygiene can be a predisposing factor for rheumatoid arthritis. Good dental hygiene was discussed.  Orders: Orders Placed This Encounter  Procedures  . CBC with Differential/Platelet   Meds ordered this encounter  Medications  . methotrexate (RHEUMATREX) 2.5 MG tablet    Sig: TAKE EIGHT TABLETS BY MOUTH ONCE WEEKLY    Dispense:  96 tablet    Refill:  0    Face-to-face time spent with patient was 30 minutes.> 50%  of time was spent in counseling and coordination of care.  Follow-Up Instructions: Return in about 5 months (around 08/09/2018) for Rheumatoid arthritis.   Bo Merino, MD  Note - This record has been created using Editor, commissioning.  Chart creation errors have been sought, but may not always  have been located. Such creation errors do not reflect on  the standard of medical care.

## 2018-03-06 DIAGNOSIS — G4730 Sleep apnea, unspecified: Secondary | ICD-10-CM | POA: Diagnosis not present

## 2018-03-09 ENCOUNTER — Encounter: Payer: Self-pay | Admitting: Rheumatology

## 2018-03-09 ENCOUNTER — Ambulatory Visit (INDEPENDENT_AMBULATORY_CARE_PROVIDER_SITE_OTHER): Payer: BLUE CROSS/BLUE SHIELD | Admitting: Rheumatology

## 2018-03-09 VITALS — BP 131/86 | HR 73 | Resp 17 | Ht 73.0 in | Wt 290.0 lb

## 2018-03-09 DIAGNOSIS — M0630 Rheumatoid nodule, unspecified site: Secondary | ICD-10-CM | POA: Diagnosis not present

## 2018-03-09 DIAGNOSIS — Z8659 Personal history of other mental and behavioral disorders: Secondary | ICD-10-CM | POA: Diagnosis not present

## 2018-03-09 DIAGNOSIS — I10 Essential (primary) hypertension: Secondary | ICD-10-CM | POA: Diagnosis not present

## 2018-03-09 DIAGNOSIS — Z8639 Personal history of other endocrine, nutritional and metabolic disease: Secondary | ICD-10-CM

## 2018-03-09 DIAGNOSIS — Z8719 Personal history of other diseases of the digestive system: Secondary | ICD-10-CM

## 2018-03-09 DIAGNOSIS — F5101 Primary insomnia: Secondary | ICD-10-CM | POA: Diagnosis not present

## 2018-03-09 DIAGNOSIS — M19012 Primary osteoarthritis, left shoulder: Secondary | ICD-10-CM

## 2018-03-09 DIAGNOSIS — M19041 Primary osteoarthritis, right hand: Secondary | ICD-10-CM

## 2018-03-09 DIAGNOSIS — M5090 Cervical disc disorder, unspecified, unspecified cervical region: Secondary | ICD-10-CM | POA: Diagnosis not present

## 2018-03-09 DIAGNOSIS — G4709 Other insomnia: Secondary | ICD-10-CM | POA: Diagnosis not present

## 2018-03-09 DIAGNOSIS — Z87891 Personal history of nicotine dependence: Secondary | ICD-10-CM | POA: Diagnosis not present

## 2018-03-09 DIAGNOSIS — Z9289 Personal history of other medical treatment: Secondary | ICD-10-CM

## 2018-03-09 DIAGNOSIS — M0579 Rheumatoid arthritis with rheumatoid factor of multiple sites without organ or systems involvement: Secondary | ICD-10-CM

## 2018-03-09 DIAGNOSIS — F419 Anxiety disorder, unspecified: Secondary | ICD-10-CM | POA: Diagnosis not present

## 2018-03-09 DIAGNOSIS — R809 Proteinuria, unspecified: Secondary | ICD-10-CM | POA: Diagnosis not present

## 2018-03-09 DIAGNOSIS — M19011 Primary osteoarthritis, right shoulder: Secondary | ICD-10-CM

## 2018-03-09 DIAGNOSIS — Z1389 Encounter for screening for other disorder: Secondary | ICD-10-CM | POA: Diagnosis not present

## 2018-03-09 DIAGNOSIS — M19042 Primary osteoarthritis, left hand: Secondary | ICD-10-CM

## 2018-03-09 DIAGNOSIS — M170 Bilateral primary osteoarthritis of knee: Secondary | ICD-10-CM | POA: Diagnosis not present

## 2018-03-09 DIAGNOSIS — G4733 Obstructive sleep apnea (adult) (pediatric): Secondary | ICD-10-CM | POA: Diagnosis not present

## 2018-03-09 DIAGNOSIS — Z79899 Other long term (current) drug therapy: Secondary | ICD-10-CM

## 2018-03-09 DIAGNOSIS — R7611 Nonspecific reaction to tuberculin skin test without active tuberculosis: Secondary | ICD-10-CM | POA: Diagnosis not present

## 2018-03-09 DIAGNOSIS — Z6838 Body mass index (BMI) 38.0-38.9, adult: Secondary | ICD-10-CM | POA: Diagnosis not present

## 2018-03-09 MED ORDER — METHOTREXATE 2.5 MG PO TABS: ORAL_TABLET | ORAL | 0 refills | Status: DC

## 2018-03-09 NOTE — Patient Instructions (Signed)
Association of heart disease with rheumatoid arthritis was discussed. Need to monitor blood pressure, cholesterol, and to exercise 30-60 minutes on daily basis was discussed. Poor dental hygiene can be a predisposing factor for rheumatoid arthritis. Good dental hygiene was discussed.  Standing Labs We placed an order today for your standing lab work.    Please come back and get your standing labs in July and every 3 months  We have open lab Monday through Friday from 8:30-11:30 AM and 1:30-4:00 PM  at the office of Dr. Bo Merino.   You may experience shorter wait times on Monday and Friday afternoons. The office is located at [address], [place], [place], [place] [address] No appointment is necessary.   Labs are drawn by [facility].  You may receive a bill from [facility] for your lab work. If you have any questions regarding directions or hours of operation,  please call [phone].

## 2018-03-20 ENCOUNTER — Telehealth: Payer: Self-pay

## 2018-03-20 MED ORDER — FOLIC ACID 1 MG PO TABS: 1.0000 mg | ORAL_TABLET | Freq: Two times a day (BID) | ORAL | 2 refills | Status: DC

## 2018-03-20 NOTE — Telephone Encounter (Signed)
Refill request received via fax.   Last visit: 03/09/2018 Next visit: 08/03/2018  Okay to refill per Dr. Estanislado Pandy.

## 2018-05-12 ENCOUNTER — Telehealth: Payer: Self-pay | Admitting: Rheumatology

## 2018-05-12 DIAGNOSIS — Z79899 Other long term (current) drug therapy: Secondary | ICD-10-CM

## 2018-05-12 NOTE — Telephone Encounter (Signed)
Lab Orders released.  

## 2018-05-12 NOTE — Telephone Encounter (Signed)
Patient called requesting his labwork orders be sent to [facility] in [place] on [address].  Patient states he will be going on Friday 05/15/18.

## 2018-05-15 ENCOUNTER — Other Ambulatory Visit: Payer: Self-pay | Admitting: Rheumatology

## 2018-05-15 DIAGNOSIS — Z79899 Other long term (current) drug therapy: Secondary | ICD-10-CM | POA: Diagnosis not present

## 2018-05-15 DIAGNOSIS — E039 Hypothyroidism, unspecified: Secondary | ICD-10-CM | POA: Diagnosis not present

## 2018-05-15 DIAGNOSIS — E875 Hyperkalemia: Secondary | ICD-10-CM | POA: Diagnosis not present

## 2018-05-16 LAB — CBC WITH DIFFERENTIAL/PLATELET
BASOS ABS: 0 10*3/uL (ref 0.0–0.2)
Basos: 1 %
EOS (ABSOLUTE): 0.2 10*3/uL (ref 0.0–0.4)
Eos: 3 %
HEMATOCRIT: 43.6 % (ref 37.5–51.0)
HEMOGLOBIN: 14.2 g/dL (ref 13.0–17.7)
IMMATURE GRANULOCYTES: 0 %
Immature Grans (Abs): 0 10*3/uL (ref 0.0–0.1)
LYMPHS ABS: 1.5 10*3/uL (ref 0.7–3.1)
Lymphs: 23 %
MCH: 32.1 pg (ref 26.6–33.0)
MCHC: 32.6 g/dL (ref 31.5–35.7)
MCV: 99 fL — ABNORMAL HIGH (ref 79–97)
MONOCYTES: 8 %
MONOS ABS: 0.5 10*3/uL (ref 0.1–0.9)
NEUTROS PCT: 65 %
Neutrophils Absolute: 4.3 10*3/uL (ref 1.4–7.0)
Platelets: 277 10*3/uL (ref 150–450)
RBC: 4.42 x10E6/uL (ref 4.14–5.80)
RDW: 12.2 % — ABNORMAL LOW (ref 12.3–15.4)
WBC: 6.6 10*3/uL (ref 3.4–10.8)

## 2018-05-16 LAB — CMP14+EGFR
A/G RATIO: 1.6 (ref 1.2–2.2)
ALT: 13 IU/L (ref 0–44)
AST: 16 IU/L (ref 0–40)
Albumin: 4.2 g/dL (ref 3.5–5.5)
Alkaline Phosphatase: 59 IU/L (ref 39–117)
BILIRUBIN TOTAL: 0.5 mg/dL (ref 0.0–1.2)
BUN / CREAT RATIO: 18 (ref 9–20)
BUN: 17 mg/dL (ref 6–24)
CALCIUM: 9.5 mg/dL (ref 8.7–10.2)
CHLORIDE: 105 mmol/L (ref 96–106)
CO2: 25 mmol/L (ref 20–29)
Creatinine, Ser: 0.95 mg/dL (ref 0.76–1.27)
GFR calc Af Amer: 101 mL/min/{1.73_m2} (ref >59)
GFR calc non Af Amer: 87 mL/min/{1.73_m2} (ref >59)
Globulin, Total: 2.7 g/dL (ref 1.5–4.5)
Glucose: 156 mg/dL — ABNORMAL HIGH (ref 65–99)
Potassium: 5.2 mmol/L (ref 3.5–5.2)
Sodium: 143 mmol/L (ref 134–144)
Total Protein: 6.9 g/dL (ref 6.0–8.5)

## 2018-06-12 ENCOUNTER — Telehealth: Payer: Self-pay | Admitting: Rheumatology

## 2018-06-12 MED ORDER — METHOTREXATE 2.5 MG PO TABS: ORAL_TABLET | ORAL | 0 refills | Status: DC

## 2018-06-12 NOTE — Telephone Encounter (Signed)
Patient called requesting prescription refill of Methotrexate to be sent to [facility].  Patient states he is out of medication and due to take his pills tomorrow.

## 2018-06-12 NOTE — Telephone Encounter (Signed)
Last visit: 03/09/2018 Next visit: 08/03/2018 Labs: 05/15/18 Glucose is elevated-156. CBC stable.  Okay to refill per Dr. Estanislado Pandy

## 2018-06-15 DIAGNOSIS — Z6838 Body mass index (BMI) 38.0-38.9, adult: Secondary | ICD-10-CM | POA: Diagnosis not present

## 2018-06-15 DIAGNOSIS — F419 Anxiety disorder, unspecified: Secondary | ICD-10-CM | POA: Diagnosis not present

## 2018-06-15 DIAGNOSIS — Z23 Encounter for immunization: Secondary | ICD-10-CM | POA: Diagnosis not present

## 2018-06-15 DIAGNOSIS — G4709 Other insomnia: Secondary | ICD-10-CM | POA: Diagnosis not present

## 2018-06-15 DIAGNOSIS — E119 Type 2 diabetes mellitus without complications: Secondary | ICD-10-CM | POA: Diagnosis not present

## 2018-06-15 DIAGNOSIS — Z1389 Encounter for screening for other disorder: Secondary | ICD-10-CM | POA: Diagnosis not present

## 2018-07-27 ENCOUNTER — Ambulatory Visit: Payer: BLUE CROSS/BLUE SHIELD | Admitting: Physician Assistant

## 2018-07-30 ENCOUNTER — Telehealth: Payer: Self-pay | Admitting: Rheumatology

## 2018-07-30 DIAGNOSIS — Z79899 Other long term (current) drug therapy: Secondary | ICD-10-CM

## 2018-07-30 NOTE — Telephone Encounter (Signed)
Lab orders released.  

## 2018-07-30 NOTE — Telephone Encounter (Signed)
Patient called requesting his labwork orders be sent to [facility] in [place].  Patient states he will be going tomorrow morning.

## 2018-07-31 ENCOUNTER — Other Ambulatory Visit: Payer: Self-pay | Admitting: Rheumatology

## 2018-07-31 DIAGNOSIS — Z79899 Other long term (current) drug therapy: Secondary | ICD-10-CM | POA: Diagnosis not present

## 2018-08-01 LAB — CMP14+EGFR
ALK PHOS: 55 IU/L (ref 39–117)
ALT: 14 IU/L (ref 0–44)
AST: 13 IU/L (ref 0–40)
Albumin/Globulin Ratio: 1.7 (ref 1.2–2.2)
Albumin: 4.3 g/dL (ref 3.5–5.5)
BUN/Creatinine Ratio: 17 (ref 9–20)
BUN: 16 mg/dL (ref 6–24)
Bilirubin Total: 0.9 mg/dL (ref 0.0–1.2)
CALCIUM: 9.2 mg/dL (ref 8.7–10.2)
CO2: 26 mmol/L (ref 20–29)
CREATININE: 0.96 mg/dL (ref 0.76–1.27)
Chloride: 100 mmol/L (ref 96–106)
GFR calc Af Amer: 100 mL/min/{1.73_m2} (ref >59)
GFR, EST NON AFRICAN AMERICAN: 86 mL/min/{1.73_m2} (ref >59)
GLUCOSE: 176 mg/dL — AB (ref 65–99)
Globulin, Total: 2.6 g/dL (ref 1.5–4.5)
Potassium: 4.9 mmol/L (ref 3.5–5.2)
Sodium: 138 mmol/L (ref 134–144)
Total Protein: 6.9 g/dL (ref 6.0–8.5)

## 2018-08-01 LAB — CBC WITH DIFFERENTIAL/PLATELET
Basophils Absolute: 0.1 10*3/uL (ref 0.0–0.2)
Basos: 1 %
EOS (ABSOLUTE): 0.2 10*3/uL (ref 0.0–0.4)
EOS: 3 %
HEMATOCRIT: 42.5 % (ref 37.5–51.0)
HEMOGLOBIN: 14.2 g/dL (ref 13.0–17.7)
IMMATURE GRANS (ABS): 0 10*3/uL (ref 0.0–0.1)
IMMATURE GRANULOCYTES: 0 %
LYMPHS: 24 %
Lymphocytes Absolute: 1.7 10*3/uL (ref 0.7–3.1)
MCH: 31.7 pg (ref 26.6–33.0)
MCHC: 33.4 g/dL (ref 31.5–35.7)
MCV: 95 fL (ref 79–97)
MONOCYTES: 8 %
Monocytes Absolute: 0.5 10*3/uL (ref 0.1–0.9)
NEUTROS PCT: 64 %
Neutrophils Absolute: 4.4 10*3/uL (ref 1.4–7.0)
Platelets: 249 10*3/uL (ref 150–450)
RBC: 4.48 x10E6/uL (ref 4.14–5.80)
RDW: 12.4 % (ref 12.3–15.4)
WBC: 6.9 10*3/uL (ref 3.4–10.8)

## 2018-08-03 ENCOUNTER — Ambulatory Visit: Payer: BLUE CROSS/BLUE SHIELD | Admitting: Physician Assistant

## 2018-08-21 NOTE — Progress Notes (Signed)
Office Visit Note  Patient: Dustin [name]             Date of Birth: 03-16-1961           MRN: [id number]             PCP: Redmond School, MD Referring: Redmond School, MD Visit Date: 09/04/2018 Occupation: _0 @  Subjective:  Medication monitoring    History of Present Illness: KUSH FARABEE is a 57 y.o. male with history of seropositive rheumatoid arthritis and osteoarthritis.  He is taking MTX 8 tablets by mouth once weekly and folic acid 1 mg po daily.  He has not missed any doses of methotrexate recently.  He has not had any recent rheumatoid arthritis flares.  He reports that he has had a upper respiratory tract infection for the past 3 to 4 weeks.  He states that he was started on a Z-Pak but continued to be symptomatic and followed up with his primary care second time and was started on doxycycline and had a IM steroid injection on Monday.  He states that his symptoms have been improving since starting on doxycycline.  He denies any fevers but continues to have cough and congestion.  He has been taking Mucinex over-the-counter for symptomatic relief.  He reports that he be following up with his primary care on 09/14/2018.  He states that due to insurance change his co-pays are very high for specialty visits.  He states that he has discussed with his primary care starting to prescribe methotrexate and checking lab work every 3 months.  He would like to follow-up with [place] on a yearly basis.  He denies any joint pain or joint swelling at this time.  He denies any morning stiffness.  He states he experiences some cramping in bilateral hands but does not have any other concerns at this time.  Activities of Daily Living:  Patient reports morning stiffness for 0 minutes.   Patient Denies nocturnal pain.  Difficulty dressing/grooming: Denies Difficulty climbing stairs: Denies Difficulty getting out of chair: Denies Difficulty using hands for taps, buttons, cutlery, and/or writing:  Denies  Review of Systems  Constitutional: Negative for fatigue and night sweats.  HENT: Negative for mouth sores, trouble swallowing, trouble swallowing, mouth dryness and nose dryness.   Eyes: Negative for redness, visual disturbance and dryness.  Respiratory: Positive for cough. Negative for hemoptysis, shortness of breath and difficulty breathing.   Cardiovascular: Negative for chest pain, palpitations, hypertension, irregular heartbeat and swelling in legs/feet.  Gastrointestinal: Negative for blood in stool, constipation and diarrhea.  Endocrine: Negative for increased urination.  Genitourinary: Negative for painful urination.  Musculoskeletal: Negative for arthralgias, joint pain, joint swelling, myalgias, muscle weakness, morning stiffness, muscle tenderness and myalgias.  Skin: Negative for color change, rash, hair loss, nodules/bumps, skin tightness, ulcers and sensitivity to sunlight.  Allergic/Immunologic: Negative for susceptible to infections.  Neurological: Negative for dizziness, fainting, memory loss, night sweats and weakness.  Hematological: Negative for swollen glands.  Psychiatric/Behavioral: Negative for depressed mood and sleep disturbance. The patient is nervous/anxious (Xanax).     PMFS History:  Patient Active Problem List   Diagnosis Date Noted  . Former smoker 04/18/2017  . Primary insomnia 04/14/2017  . 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 History of positive PPD treated with INH  04/14/2017  . Abscess of left olecranon bursa 11/07/2016  . High risk medication use 11/07/2016  . Cervical disc disease 11/07/2016  . Primary osteoarthritis of both hands 11/07/2016  .  Primary osteoarthritis of both knees 11/07/2016  . Primary osteoarthritis of both shoulders 11/07/2016  . Precordial pain 01/22/2016  . Pain in the chest   . Hyperlipidemia 01/20/2016  . Depression with anxiety 01/20/2016  . GERD (gastroesophageal reflux disease) 01/20/2016  . Type 2  diabetes mellitus ([place]) 01/20/2016  . Chest pain at rest 09/22/2014  . Tobacco use 09/22/2014  . Morbid obesity ([place]) 09/22/2014  . Rheumatoid arthritis ([place]) 09/22/2014  . Family history of heart disease 09/22/2014  . Chest pain 09/22/2014    Past Medical History:  Diagnosis Date  . Anxiety   . COPD (chronic obstructive pulmonary disease) ([place])   . Depression   . Essential hypertension   . GERD (gastroesophageal reflux disease)   . H/O hiatal hernia   . History of cardiac catheterization    Normal coronaries May 2017  . History of ITP    Childhood  . Osteoarthritis   . Type 2 diabetes mellitus (HCC)     Family History  Problem Relation Age of Onset  . Heart disease Mother   . COPD Mother   . Heart attack Father        Sudden death  . Heart attack Sister 79  . Kidney disease Sister   . Heart disease Sister   . Heart attack Brother   . Heart disease Brother   . Diabetes Brother   . Heart attack Brother   . Heart disease Brother 72       CABG  . Heart attack Brother 69   Past Surgical History:  Procedure Laterality Date  . ANTERIOR CERVICAL DECOMP/DISCECTOMY FUSION  01/21/2012   Procedure: ANTERIOR CERVICAL DECOMPRESSION/DISCECTOMY FUSION 2 LEVELS;  Surgeon: Erline Levine, MD;  Location: [facility];  Service: Neurosurgery;  Laterality: N/A;  Cervical Six-Seven, Cervical Seven-Thoracic One, Anterior Cervical Decompression with Fusion Interbody Prothesis Plating and Bonegraft possible posterior Cervical Seven-Thoracic One Foraminotomy  . CARDIAC CATHETERIZATION  [phone]   normal coronary arteries  . CARDIAC CATHETERIZATION N/A 01/22/2016   Procedure: Left Heart Cath and Coronary Angiography;  Surgeon: Burnell Blanks, MD;  Location: [facility];  Service: Cardiovascular;  Laterality: N/A;  . CARPAL TUNNEL RELEASE Bilateral   . CERVICAL DISC SURGERY  04  . CHOLECYSTECTOMY N/A 10/26/2014   Procedure: LAPAROSCOPIC CHOLECYSTECTOMY;  Surgeon: Jamesetta So,  MD;  Location: [facility];  Service: General;  Laterality: N/A;  . ESOPHAGOGASTRODUODENOSCOPY N/A 05/03/2016   Procedure: ESOPHAGOGASTRODUODENOSCOPY (EGD);  Surgeon: Rogene Houston, MD;  Location: [facility];  Service: Endoscopy;  Laterality: N/A;  10:30  . HERNIA REPAIR Right 60's  . Left elbow bursa removed    . TONSILLECTOMY  64   Social History   Social History Narrative  . Not on file    Objective: Vital Signs: BP 134/88 (BP Location: Left Arm, Patient Position: Sitting, Cuff Size: Large)   Pulse 71   Resp 16   Ht _0  (1.854 m)   Wt 284 lb 9.6 oz (129.1 kg)   BMI 37.55 kg/m    Physical Exam Vitals signs and nursing note reviewed.  Constitutional:      Appearance: He is well-developed.  HENT:     Head: Normocephalic and atraumatic.  Eyes:     Conjunctiva/sclera: Conjunctivae normal.     Pupils: Pupils are equal, round, and reactive to light.  Neck:     Musculoskeletal: Normal range of motion and neck supple.  Cardiovascular:     Rate and Rhythm: Normal rate  and regular rhythm.     Heart sounds: Normal heart sounds.  Pulmonary:     Effort: Pulmonary effort is normal.     Breath sounds: Normal breath sounds.  Abdominal:     General: Bowel sounds are normal.     Palpations: Abdomen is soft.  Lymphadenopathy:     Cervical: No cervical adenopathy.  Skin:    General: Skin is warm and dry.     Capillary Refill: Capillary refill takes less than 2 seconds.  Neurological:     Mental Status: He is alert and oriented to person, place, and time.  Psychiatric:        Behavior: Behavior normal.      Musculoskeletal Exam: C-spine, thoracic spine, lumbar spine good range of motion.  No midline spinal tenderness.  No SI joint tenderness.  Shoulder joints, elbow joints, wrist joints, MCPs, PIPs, DIPs good range of motion with no synovitis.  He is synovial thickening of bilateral second and third MCP joints.  He has PIP and DIP synovial thickening consistent with osteoarthritis  of bilateral hands.  He has complete fist formation bilaterally.  Hip joints, knee joints, ankle joints, MCPs, PIPs and DIPs good range of motion no synovitis.  No warmth or effusion bilateral knee joints.  No tenderness or swelling of ankle joints.  No tenderness of trochanteric bursa bilaterally.   CDAI Exam: CDAI Score: 0.4  Patient Global Assessment: 2 (mm); Provider Global Assessment: 2 (mm) Swollen: 0 ; Tender: 0  Joint Exam   Not documented   There is currently no information documented on the homunculus. Go to the Rheumatology activity and complete the homunculus joint exam.  Investigation: No additional findings.  Imaging: No results found.  Recent Labs: Lab Results  Component Value Date   WBC 6.9 07/31/2018   HGB 14.2 07/31/2018   PLT 249 07/31/2018   NA 138 07/31/2018   K 4.9 07/31/2018   CL 100 07/31/2018   CO2 26 07/31/2018   GLUCOSE 176 (H) 07/31/2018   BUN 16 07/31/2018   CREATININE 0.96 07/31/2018   BILITOT 0.9 07/31/2018   ALKPHOS 55 07/31/2018   AST 13 07/31/2018   ALT 14 07/31/2018   PROT 6.9 07/31/2018   ALBUMIN 4.3 07/31/2018   CALCIUM 9.2 07/31/2018   GFRAA 100 07/31/2018    Speciality Comments: No specialty comments available.  Procedures:  No procedures performed Allergies: Patient has no known allergies.     Assessment / Plan:     Visit Diagnoses: Rheumatoid arthritis involving multiple sites with positive rheumatoid factor (HCC) - +RF,+anti-CCP,+ANA, with nodulosis: He has no synovitis on exam.  He has not had any recent rheumatoid arthritis flares.  He has no joint pain or joint swelling at this time.  He has no morning stiffness.  He is clinically doing well on methotrexate 8 tablets by mouth once weekly and folic acid 2 mg by mouth daily.  X-rays of both hands and feet were taken on 10/06/17. Due to change in insurance his co-pay for specialty visits is very expensive.  He was discussed with his primary care taking over prescribing  methotrexate as well as lab work every 3 months.  He already follows up with his primary care every 3 months and plans on having lab work at those visits.  He would like to continue to follow-up in our office on a yearly basis.  He was advised to notify us if he develops a flare.  He will continue on his current treatment regimen.  A refill of methotrexate was sent to the pharmacy today.  High risk medication use - Methotrexate 8 tablets by mouth every week, folic acid 2 mg by mouth daily.  He had the seasonal influenza vaccination.  Glucose was 176 but all other lab work was within normal limits.  Standing orders are in place.  He will be following up with his primary care on 09/14/2018.  He will be following up with his primary care every 3 months and will have lab work at that time.  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   History of positive PPD treated with INH   Primary osteoarthritis of both hands: He has PIP and DIP synovial thickening consistent with osteoarthritis of bilateral hands.  He has complete fist formation bilaterally.  Joint protection and muscle strengthening were discussed.  Primary osteoarthritis of both shoulders: He has good range of motion of bilateral shoulder joints with no discomfort.  No effusion noted.  Primary osteoarthritis of both knees: No warmth or effusion.  Good range of motion with no discomfort.  Cervical disc disease: He has good range of motion on exam.  He has no discomfort at this time.  He has no symptoms of radiculopathy.  Other medical conditions are listed as follows:  History of hyperlipidemia  Former smoker  Primary insomnia  History of depression  History of gastroesophageal reflux (GERD)  History of diabetes mellitus  History of hypothyroidism   Orders: Orders Placed This Encounter    Procedures  . CMP14+EGFR   Meds ordered this encounter  Medications  . methotrexate (RHEUMATREX) 2.5 MG tablet    Sig: TAKE EIGHT TABLETS BY MOUTH ONCE WEEKLY    Dispense:  96 tablet    Refill:  0     Follow-Up Instructions: Return in about 1 year (around 09/05/2019) for Rheumatoid arthritis.   Ofilia Neas, PA-C  Note - This record has been created using Dragon software.  Chart creation errors have been sought, but may not always  have been located. Such creation errors do not reflect on  the standard of medical care.

## 2018-08-31 DIAGNOSIS — Z681 Body mass index (BMI) 19 or less, adult: Secondary | ICD-10-CM | POA: Diagnosis not present

## 2018-08-31 DIAGNOSIS — Z1389 Encounter for screening for other disorder: Secondary | ICD-10-CM | POA: Diagnosis not present

## 2018-08-31 DIAGNOSIS — R0602 Shortness of breath: Secondary | ICD-10-CM | POA: Diagnosis not present

## 2018-08-31 DIAGNOSIS — R05 Cough: Secondary | ICD-10-CM | POA: Diagnosis not present

## 2018-08-31 DIAGNOSIS — J343 Hypertrophy of nasal turbinates: Secondary | ICD-10-CM | POA: Diagnosis not present

## 2018-08-31 DIAGNOSIS — J019 Acute sinusitis, unspecified: Secondary | ICD-10-CM | POA: Diagnosis not present

## 2018-08-31 DIAGNOSIS — R062 Wheezing: Secondary | ICD-10-CM | POA: Diagnosis not present

## 2018-09-04 ENCOUNTER — Encounter: Payer: Self-pay | Admitting: Physician Assistant

## 2018-09-04 ENCOUNTER — Ambulatory Visit (INDEPENDENT_AMBULATORY_CARE_PROVIDER_SITE_OTHER): Payer: BLUE CROSS/BLUE SHIELD | Admitting: Physician Assistant

## 2018-09-04 VITALS — BP 134/88 | HR 71 | Resp 16 | Ht 73.0 in | Wt 284.6 lb

## 2018-09-04 DIAGNOSIS — F5101 Primary insomnia: Secondary | ICD-10-CM

## 2018-09-04 DIAGNOSIS — M0579 Rheumatoid arthritis with rheumatoid factor of multiple sites without organ or systems involvement: Secondary | ICD-10-CM | POA: Diagnosis not present

## 2018-09-04 DIAGNOSIS — Z79899 Other long term (current) drug therapy: Secondary | ICD-10-CM | POA: Diagnosis not present

## 2018-09-04 DIAGNOSIS — Z8639 Personal history of other endocrine, nutritional and metabolic disease: Secondary | ICD-10-CM

## 2018-09-04 DIAGNOSIS — M5090 Cervical disc disorder, unspecified, unspecified cervical region: Secondary | ICD-10-CM

## 2018-09-04 DIAGNOSIS — M0630 Rheumatoid nodule, unspecified site: Secondary | ICD-10-CM | POA: Diagnosis not present

## 2018-09-04 DIAGNOSIS — Z9289 Personal history of other medical treatment: Secondary | ICD-10-CM | POA: Diagnosis not present

## 2018-09-04 DIAGNOSIS — M170 Bilateral primary osteoarthritis of knee: Secondary | ICD-10-CM

## 2018-09-04 DIAGNOSIS — Z8719 Personal history of other diseases of the digestive system: Secondary | ICD-10-CM

## 2018-09-04 DIAGNOSIS — M19011 Primary osteoarthritis, right shoulder: Secondary | ICD-10-CM

## 2018-09-04 DIAGNOSIS — Z87891 Personal history of nicotine dependence: Secondary | ICD-10-CM

## 2018-09-04 DIAGNOSIS — Z8659 Personal history of other mental and behavioral disorders: Secondary | ICD-10-CM

## 2018-09-04 DIAGNOSIS — M19042 Primary osteoarthritis, left hand: Secondary | ICD-10-CM

## 2018-09-04 DIAGNOSIS — M19041 Primary osteoarthritis, right hand: Secondary | ICD-10-CM

## 2018-09-04 DIAGNOSIS — M19012 Primary osteoarthritis, left shoulder: Secondary | ICD-10-CM

## 2018-09-04 MED ORDER — METHOTREXATE 2.5 MG PO TABS: ORAL_TABLET | ORAL | 0 refills | Status: DC

## 2018-09-04 NOTE — Patient Instructions (Signed)
Standing Labs We placed an order today for your standing lab work.    Please come back and get your standing labs in February and every 3 months   We have open lab Monday through Friday from 8:30-11:30 AM and 1:30-4:00 PM  at the office of Dr. Shaili Deveshwar.   You may experience shorter wait times on Monday and Friday afternoons. The office is located at [address], [address], [place], [place] [address] No appointment is necessary.   Labs are drawn by [facility].  You may receive a bill from [facility] for your lab work.  If you wish to have your labs drawn at another location, please call the office 24 hours in advance to send orders.  If you have any questions regarding directions or hours of operation,  please call [phone].   Just as a reminder please drink plenty of water prior to coming for your lab work. Thanks!   

## 2018-10-26 DIAGNOSIS — G4709 Other insomnia: Secondary | ICD-10-CM | POA: Diagnosis not present

## 2018-10-26 DIAGNOSIS — Z6838 Body mass index (BMI) 38.0-38.9, adult: Secondary | ICD-10-CM | POA: Diagnosis not present

## 2018-10-26 DIAGNOSIS — E039 Hypothyroidism, unspecified: Secondary | ICD-10-CM | POA: Diagnosis not present

## 2018-10-26 DIAGNOSIS — Z1389 Encounter for screening for other disorder: Secondary | ICD-10-CM | POA: Diagnosis not present

## 2018-10-26 DIAGNOSIS — M069 Rheumatoid arthritis, unspecified: Secondary | ICD-10-CM | POA: Diagnosis not present

## 2018-10-26 DIAGNOSIS — F419 Anxiety disorder, unspecified: Secondary | ICD-10-CM | POA: Diagnosis not present

## 2018-10-26 DIAGNOSIS — E119 Type 2 diabetes mellitus without complications: Secondary | ICD-10-CM | POA: Diagnosis not present

## 2018-10-26 DIAGNOSIS — Z79899 Other long term (current) drug therapy: Secondary | ICD-10-CM | POA: Diagnosis not present

## 2018-10-27 LAB — CBC WITH DIFFERENTIAL/PLATELET
BASOS: 1 %
Basophils Absolute: 0.1 10*3/uL (ref 0.0–0.2)
EOS (ABSOLUTE): 0.2 10*3/uL (ref 0.0–0.4)
Eos: 2 %
Hematocrit: 43.4 % (ref 37.5–51.0)
Hemoglobin: 14.8 g/dL (ref 13.0–17.7)
IMMATURE GRANS (ABS): 0 10*3/uL (ref 0.0–0.1)
Immature Granulocytes: 0 %
LYMPHS: 21 %
Lymphocytes Absolute: 1.5 10*3/uL (ref 0.7–3.1)
MCH: 32.9 pg (ref 26.6–33.0)
MCHC: 34.1 g/dL (ref 31.5–35.7)
MCV: 96 fL (ref 79–97)
Monocytes Absolute: 0.5 10*3/uL (ref 0.1–0.9)
Monocytes: 8 %
NEUTROS PCT: 68 %
Neutrophils Absolute: 4.9 10*3/uL (ref 1.4–7.0)
PLATELETS: 247 10*3/uL (ref 150–450)
RBC: 4.5 x10E6/uL (ref 4.14–5.80)
RDW: 12.5 % (ref 11.6–15.4)
WBC: 7.2 10*3/uL (ref 3.4–10.8)

## 2018-10-27 LAB — CMP14+EGFR
ALBUMIN: 4.2 g/dL (ref 3.8–4.9)
ALK PHOS: 62 IU/L (ref 39–117)
ALT: 19 IU/L (ref 0–44)
AST: 18 IU/L (ref 0–40)
Albumin/Globulin Ratio: 1.7 (ref 1.2–2.2)
BUN/Creatinine Ratio: 17 (ref 9–20)
BUN: 18 mg/dL (ref 6–24)
Bilirubin Total: 0.9 mg/dL (ref 0.0–1.2)
CHLORIDE: 97 mmol/L (ref 96–106)
CO2: 28 mmol/L (ref 20–29)
CREATININE: 1.04 mg/dL (ref 0.76–1.27)
Calcium: 9.4 mg/dL (ref 8.7–10.2)
GFR calc non Af Amer: 78 mL/min/{1.73_m2} (ref >59)
GFR, EST AFRICAN AMERICAN: 90 mL/min/{1.73_m2} (ref >59)
GLUCOSE: 158 mg/dL — AB (ref 65–99)
Globulin, Total: 2.5 g/dL (ref 1.5–4.5)
Potassium: 5.5 mmol/L — ABNORMAL HIGH (ref 3.5–5.2)
SODIUM: 139 mmol/L (ref 134–144)
Total Protein: 6.7 g/dL (ref 6.0–8.5)

## 2018-12-13 ENCOUNTER — Other Ambulatory Visit: Payer: Self-pay | Admitting: Rheumatology

## 2018-12-14 NOTE — Telephone Encounter (Signed)
Last Visit: 09/04/18 Next visit: 09/03/19  Okay to refill per Dr. Estanislado Pandy

## 2018-12-22 DIAGNOSIS — M069 Rheumatoid arthritis, unspecified: Secondary | ICD-10-CM | POA: Diagnosis not present

## 2019-01-25 DIAGNOSIS — Z0001 Encounter for general adult medical examination with abnormal findings: Secondary | ICD-10-CM | POA: Diagnosis not present

## 2019-01-25 DIAGNOSIS — E119 Type 2 diabetes mellitus without complications: Secondary | ICD-10-CM | POA: Diagnosis not present

## 2019-01-25 DIAGNOSIS — Z6838 Body mass index (BMI) 38.0-38.9, adult: Secondary | ICD-10-CM | POA: Diagnosis not present

## 2019-01-25 DIAGNOSIS — Z23 Encounter for immunization: Secondary | ICD-10-CM | POA: Diagnosis not present

## 2019-01-25 DIAGNOSIS — I10 Essential (primary) hypertension: Secondary | ICD-10-CM | POA: Diagnosis not present

## 2019-01-25 DIAGNOSIS — Z125 Encounter for screening for malignant neoplasm of prostate: Secondary | ICD-10-CM | POA: Diagnosis not present

## 2019-01-25 DIAGNOSIS — Z1389 Encounter for screening for other disorder: Secondary | ICD-10-CM | POA: Diagnosis not present

## 2019-01-25 DIAGNOSIS — R351 Nocturia: Secondary | ICD-10-CM | POA: Diagnosis not present

## 2019-03-22 DIAGNOSIS — E119 Type 2 diabetes mellitus without complications: Secondary | ICD-10-CM | POA: Diagnosis not present

## 2019-05-10 DIAGNOSIS — Z6838 Body mass index (BMI) 38.0-38.9, adult: Secondary | ICD-10-CM | POA: Diagnosis not present

## 2019-05-10 DIAGNOSIS — E875 Hyperkalemia: Secondary | ICD-10-CM | POA: Diagnosis not present

## 2019-05-10 DIAGNOSIS — M1991 Primary osteoarthritis, unspecified site: Secondary | ICD-10-CM | POA: Diagnosis not present

## 2019-05-10 DIAGNOSIS — E119 Type 2 diabetes mellitus without complications: Secondary | ICD-10-CM | POA: Diagnosis not present

## 2019-08-16 DIAGNOSIS — Z6837 Body mass index (BMI) 37.0-37.9, adult: Secondary | ICD-10-CM | POA: Diagnosis not present

## 2019-08-16 DIAGNOSIS — E039 Hypothyroidism, unspecified: Secondary | ICD-10-CM | POA: Diagnosis not present

## 2019-08-16 DIAGNOSIS — M069 Rheumatoid arthritis, unspecified: Secondary | ICD-10-CM | POA: Diagnosis not present

## 2019-08-16 DIAGNOSIS — E119 Type 2 diabetes mellitus without complications: Secondary | ICD-10-CM | POA: Diagnosis not present

## 2019-08-16 DIAGNOSIS — Z1389 Encounter for screening for other disorder: Secondary | ICD-10-CM | POA: Diagnosis not present

## 2019-09-03 ENCOUNTER — Ambulatory Visit: Payer: Self-pay | Admitting: Rheumatology

## 2019-09-27 ENCOUNTER — Other Ambulatory Visit: Payer: Self-pay | Admitting: Rheumatology

## 2019-11-15 DIAGNOSIS — E559 Vitamin D deficiency, unspecified: Secondary | ICD-10-CM | POA: Diagnosis not present

## 2019-11-15 DIAGNOSIS — Z6838 Body mass index (BMI) 38.0-38.9, adult: Secondary | ICD-10-CM | POA: Diagnosis not present

## 2019-11-15 DIAGNOSIS — E063 Autoimmune thyroiditis: Secondary | ICD-10-CM | POA: Diagnosis not present

## 2019-11-15 DIAGNOSIS — E7849 Other hyperlipidemia: Secondary | ICD-10-CM | POA: Diagnosis not present

## 2019-11-15 DIAGNOSIS — B079 Viral wart, unspecified: Secondary | ICD-10-CM | POA: Diagnosis not present

## 2019-11-15 DIAGNOSIS — M0689 Other specified rheumatoid arthritis, multiple sites: Secondary | ICD-10-CM | POA: Diagnosis not present

## 2019-11-15 DIAGNOSIS — E119 Type 2 diabetes mellitus without complications: Secondary | ICD-10-CM | POA: Diagnosis not present

## 2019-11-15 DIAGNOSIS — Z125 Encounter for screening for malignant neoplasm of prostate: Secondary | ICD-10-CM | POA: Diagnosis not present

## 2019-11-15 DIAGNOSIS — M0630 Rheumatoid nodule, unspecified site: Secondary | ICD-10-CM | POA: Diagnosis not present

## 2019-11-15 DIAGNOSIS — K219 Gastro-esophageal reflux disease without esophagitis: Secondary | ICD-10-CM | POA: Diagnosis not present

## 2019-12-08 DIAGNOSIS — Z23 Encounter for immunization: Secondary | ICD-10-CM | POA: Diagnosis not present

## 2020-01-05 DIAGNOSIS — Z23 Encounter for immunization: Secondary | ICD-10-CM | POA: Diagnosis not present

## 2020-03-10 DIAGNOSIS — E119 Type 2 diabetes mellitus without complications: Secondary | ICD-10-CM | POA: Diagnosis not present

## 2020-03-10 DIAGNOSIS — E782 Mixed hyperlipidemia: Secondary | ICD-10-CM | POA: Diagnosis not present

## 2020-03-10 DIAGNOSIS — Z6837 Body mass index (BMI) 37.0-37.9, adult: Secondary | ICD-10-CM | POA: Diagnosis not present

## 2020-03-10 DIAGNOSIS — M069 Rheumatoid arthritis, unspecified: Secondary | ICD-10-CM | POA: Diagnosis not present

## 2020-03-10 DIAGNOSIS — I10 Essential (primary) hypertension: Secondary | ICD-10-CM | POA: Diagnosis not present

## 2020-03-10 DIAGNOSIS — Z024 Encounter for examination for driving license: Secondary | ICD-10-CM | POA: Diagnosis not present

## 2020-03-28 DIAGNOSIS — E063 Autoimmune thyroiditis: Secondary | ICD-10-CM | POA: Diagnosis not present

## 2020-03-28 DIAGNOSIS — M1991 Primary osteoarthritis, unspecified site: Secondary | ICD-10-CM | POA: Diagnosis not present

## 2020-03-28 DIAGNOSIS — I10 Essential (primary) hypertension: Secondary | ICD-10-CM | POA: Diagnosis not present

## 2020-03-28 DIAGNOSIS — Z6837 Body mass index (BMI) 37.0-37.9, adult: Secondary | ICD-10-CM | POA: Diagnosis not present

## 2020-03-28 DIAGNOSIS — J329 Chronic sinusitis, unspecified: Secondary | ICD-10-CM | POA: Diagnosis not present

## 2020-06-05 ENCOUNTER — Telehealth: Payer: Self-pay | Admitting: Rheumatology

## 2020-06-05 DIAGNOSIS — Z79899 Other long term (current) drug therapy: Secondary | ICD-10-CM

## 2020-06-05 NOTE — Telephone Encounter (Signed)
Patient advised he would need to have his PCP fax lab work. Patient states his PCP has been prescribing the MTX. Patient advised to come to office before his 3 years are up. Patient states he is having pain in his hips and has scheduled an appointment for evaluation.

## 2020-06-05 NOTE — Telephone Encounter (Signed)
Patient called stating he has been getting his labwork every 3 months with Dr. Gerarda Fraction.  Patient is requesting a return call to let him know if he needs to call their office and have a copy of the results sent to Dr. Estanislado Pandy.

## 2020-06-15 ENCOUNTER — Telehealth: Payer: Self-pay

## 2020-06-15 ENCOUNTER — Other Ambulatory Visit: Payer: Self-pay | Admitting: *Deleted

## 2020-06-15 DIAGNOSIS — Z79899 Other long term (current) drug therapy: Secondary | ICD-10-CM

## 2020-06-15 NOTE — Telephone Encounter (Signed)
Patient called requesting prescription refill of [facility] in [place].  Patient states he will be going tomorrow morning 06/16/20.

## 2020-06-15 NOTE — Telephone Encounter (Signed)
Lab Orders released.  

## 2020-06-16 DIAGNOSIS — Z6836 Body mass index (BMI) 36.0-36.9, adult: Secondary | ICD-10-CM | POA: Diagnosis not present

## 2020-06-16 DIAGNOSIS — E039 Hypothyroidism, unspecified: Secondary | ICD-10-CM | POA: Diagnosis not present

## 2020-06-16 DIAGNOSIS — Z1389 Encounter for screening for other disorder: Secondary | ICD-10-CM | POA: Diagnosis not present

## 2020-06-16 DIAGNOSIS — Z1331 Encounter for screening for depression: Secondary | ICD-10-CM | POA: Diagnosis not present

## 2020-06-16 DIAGNOSIS — M1991 Primary osteoarthritis, unspecified site: Secondary | ICD-10-CM | POA: Diagnosis not present

## 2020-06-16 DIAGNOSIS — E6609 Other obesity due to excess calories: Secondary | ICD-10-CM | POA: Diagnosis not present

## 2020-06-16 DIAGNOSIS — E119 Type 2 diabetes mellitus without complications: Secondary | ICD-10-CM | POA: Diagnosis not present

## 2020-06-16 DIAGNOSIS — G894 Chronic pain syndrome: Secondary | ICD-10-CM | POA: Diagnosis not present

## 2020-06-16 DIAGNOSIS — E7849 Other hyperlipidemia: Secondary | ICD-10-CM | POA: Diagnosis not present

## 2020-06-16 DIAGNOSIS — Z79899 Other long term (current) drug therapy: Secondary | ICD-10-CM | POA: Diagnosis not present

## 2020-06-16 DIAGNOSIS — I10 Essential (primary) hypertension: Secondary | ICD-10-CM | POA: Diagnosis not present

## 2020-06-16 DIAGNOSIS — F419 Anxiety disorder, unspecified: Secondary | ICD-10-CM | POA: Diagnosis not present

## 2020-06-16 DIAGNOSIS — E063 Autoimmune thyroiditis: Secondary | ICD-10-CM | POA: Diagnosis not present

## 2020-06-16 DIAGNOSIS — Z0001 Encounter for general adult medical examination with abnormal findings: Secondary | ICD-10-CM | POA: Diagnosis not present

## 2020-06-16 DIAGNOSIS — G4709 Other insomnia: Secondary | ICD-10-CM | POA: Diagnosis not present

## 2020-06-17 LAB — CBC WITH DIFFERENTIAL/PLATELET
Basophils Absolute: 0.1 10*3/uL (ref 0.0–0.2)
Basos: 1 %
EOS (ABSOLUTE): 0.1 10*3/uL (ref 0.0–0.4)
Eos: 1 %
Hematocrit: 42.7 % (ref 37.5–51.0)
Hemoglobin: 14.5 g/dL (ref 13.0–17.7)
Immature Grans (Abs): 0 10*3/uL (ref 0.0–0.1)
Immature Granulocytes: 0 %
Lymphocytes Absolute: 1.6 10*3/uL (ref 0.7–3.1)
Lymphs: 21 %
MCH: 33.3 pg — ABNORMAL HIGH (ref 26.6–33.0)
MCHC: 34 g/dL (ref 31.5–35.7)
MCV: 98 fL — ABNORMAL HIGH (ref 79–97)
Monocytes Absolute: 0.5 10*3/uL (ref 0.1–0.9)
Monocytes: 7 %
Neutrophils Absolute: 5.3 10*3/uL (ref 1.4–7.0)
Neutrophils: 70 %
Platelets: 276 10*3/uL (ref 150–450)
RBC: 4.35 x10E6/uL (ref 4.14–5.80)
RDW: 12.7 % (ref 11.6–15.4)
WBC: 7.6 10*3/uL (ref 3.4–10.8)

## 2020-06-17 LAB — CMP14+EGFR
ALT: 12 IU/L (ref 0–44)
AST: 14 IU/L (ref 0–40)
Albumin/Globulin Ratio: 1.6 (ref 1.2–2.2)
Albumin: 4.3 g/dL (ref 3.8–4.8)
Alkaline Phosphatase: 61 IU/L (ref 44–121)
BUN/Creatinine Ratio: 22 (ref 10–24)
BUN: 18 mg/dL (ref 8–27)
Bilirubin Total: 1 mg/dL (ref 0.0–1.2)
CO2: 29 mmol/L (ref 20–29)
Calcium: 9.4 mg/dL (ref 8.6–10.2)
Chloride: 103 mmol/L (ref 96–106)
Creatinine, Ser: 0.82 mg/dL (ref 0.76–1.27)
GFR calc Af Amer: 110 mL/min/{1.73_m2} (ref >59)
GFR calc non Af Amer: 95 mL/min/{1.73_m2} (ref >59)
Globulin, Total: 2.7 g/dL (ref 1.5–4.5)
Glucose: 120 mg/dL — ABNORMAL HIGH (ref 65–99)
Potassium: 5 mmol/L (ref 3.5–5.2)
Sodium: 142 mmol/L (ref 134–144)
Total Protein: 7 g/dL (ref 6.0–8.5)

## 2020-06-17 NOTE — Progress Notes (Signed)
Glucose is elevated, probably not a fasting sample. CBC is normal.

## 2020-06-23 NOTE — Progress Notes (Signed)
Office Visit Note  Patient: Dustin [name]             Date of Birth: 11/24/1960           MRN: [id number]             PCP: Redmond School, MD Referring: Redmond School, MD Visit Date: 07/07/2020 Occupation: @GUAROCC @  Subjective:  Medication monitoring   History of Present Illness: Dustin [name] is a 59 y.o. male with history of seropositive rheumatoid arthritis and osteoarthritis.  He is taking methotrexate 8 tablets by mouth once weekly and folic acid 2 mg po daily.  He denies any recent rheumatoid arthritis flares.  He discontinued MTX in April 2020 due to the concern for contracting the covid-19 infection during the pandemic.  He resumed MTX in May 2021. He did not notice any increased joint pain or inflammation while off of MTX.  He is not having any joint pain or joint swelling at this time. He experiences morning stiffness every morning for about 1 hour.  He denies any nocturnal pain or difficulty with ALDs.  He has received both covid-19 vaccine doses and is considering receiving the booster dose.  He denies any recent infections.   Activities of Daily Living:  Patient reports morning stiffness for 1 hour.   Patient Denies nocturnal pain.  Difficulty dressing/grooming: Denies Difficulty climbing stairs: Denies Difficulty getting out of chair: Denies Difficulty using hands for taps, buttons, cutlery, and/or writing: Denies  Review of Systems  Constitutional: Positive for fatigue. Negative for night sweats.  HENT: Negative for mouth sores, mouth dryness and nose dryness.   Eyes: Negative for redness and dryness.  Respiratory: Negative for cough, hemoptysis, shortness of breath and difficulty breathing.   Cardiovascular: Negative for chest pain, palpitations, hypertension, irregular heartbeat and swelling in legs/feet.  Gastrointestinal: Negative for constipation and diarrhea.  Endocrine: Negative for excessive thirst and increased urination.  Genitourinary: Negative for  difficulty urinating and painful urination.  Musculoskeletal: Positive for morning stiffness. Negative for arthralgias, joint pain, joint swelling, myalgias, muscle weakness, muscle tenderness and myalgias.  Skin: Negative for color change, rash, hair loss, nodules/bumps, skin tightness, ulcers and sensitivity to sunlight.  Allergic/Immunologic: Negative for susceptible to infections.  Neurological: Positive for numbness. Negative for dizziness, fainting, memory loss, night sweats and weakness.  Hematological: Positive for bruising/bleeding tendency. Negative for swollen glands.  Psychiatric/Behavioral: Negative for depressed mood and sleep disturbance. The patient is not nervous/anxious.     PMFS History:  Patient Active Problem List   Diagnosis Date Noted  . Former smoker 04/18/2017  . Primary insomnia 04/14/2017  . 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 History of positive PPD treated with INH  04/14/2017  . Abscess of left olecranon bursa 11/07/2016  . High risk medication use 11/07/2016  . Cervical disc disease 11/07/2016  . Primary osteoarthritis of both hands 11/07/2016  . Primary osteoarthritis of both knees 11/07/2016  . Primary osteoarthritis of both shoulders 11/07/2016  . Precordial pain 01/22/2016  . Pain in the chest   . Hyperlipidemia 01/20/2016  . Depression with anxiety 01/20/2016  . GERD (gastroesophageal reflux disease) 01/20/2016  . Type 2 diabetes mellitus ([place]) 01/20/2016  . Chest pain at rest 09/22/2014  . Tobacco use 09/22/2014  . Morbid obesity ([place]) 09/22/2014  . Rheumatoid arthritis ([place]) 09/22/2014  . Family history of heart disease 09/22/2014  . Chest pain 09/22/2014    Past Medical History:  Diagnosis Date  . Anxiety   . COPD (  chronic obstructive pulmonary disease) ([place])   . Depression   . Essential hypertension   . GERD (gastroesophageal reflux disease)   . H/O hiatal hernia   . History of cardiac catheterization    Normal coronaries May  2017  . History of ITP    Childhood  . Osteoarthritis   . Type 2 diabetes mellitus (HCC)     Family History  Problem Relation Age of Onset  . Heart disease Mother   . COPD Mother   . Heart attack Father        Sudden death  . Heart attack Sister 44  . Kidney disease Sister   . Heart disease Sister   . Heart attack Brother   . Heart disease Brother   . Diabetes Brother   . Heart attack Brother   . Heart disease Brother 22       CABG  . Heart attack Brother 53   Past Surgical History:  Procedure Laterality Date  . ANTERIOR CERVICAL DECOMP/DISCECTOMY FUSION  01/21/2012   Procedure: ANTERIOR CERVICAL DECOMPRESSION/DISCECTOMY FUSION 2 LEVELS;  Surgeon: Erline Levine, MD;  Location: [facility];  Service: Neurosurgery;  Laterality: N/A;  Cervical Six-Seven, Cervical Seven-Thoracic One, Anterior Cervical Decompression with Fusion Interbody Prothesis Plating and Bonegraft possible posterior Cervical Seven-Thoracic One Foraminotomy  . CARDIAC CATHETERIZATION  [phone]   normal coronary arteries  . CARDIAC CATHETERIZATION N/A 01/22/2016   Procedure: Left Heart Cath and Coronary Angiography;  Surgeon: Burnell Blanks, MD;  Location: [facility];  Service: Cardiovascular;  Laterality: N/A;  . CARPAL TUNNEL RELEASE Bilateral   . CERVICAL DISC SURGERY  04  . CHOLECYSTECTOMY N/A 10/26/2014   Procedure: LAPAROSCOPIC CHOLECYSTECTOMY;  Surgeon: Jamesetta So, MD;  Location: [facility];  Service: General;  Laterality: N/A;  . ESOPHAGOGASTRODUODENOSCOPY N/A 05/03/2016   Procedure: ESOPHAGOGASTRODUODENOSCOPY (EGD);  Surgeon: Rogene Houston, MD;  Location: [facility];  Service: Endoscopy;  Laterality: N/A;  10:30  . HERNIA REPAIR Right 60's  . Left elbow bursa removed    . TONSILLECTOMY  41   Social History   Social History Narrative  . Not on file   Immunization History  Administered Date(s) Administered  . Moderna SARS-COVID-2 Vaccination 12/13/2019, 01/03/2020      Objective: Vital Signs: BP 115/76 (BP Location: Right Arm, Patient Position: Sitting, Cuff Size: Normal)   Pulse 88   Resp 18   Ht 6\' 1"  (1.854 m)   Wt 271 lb 3.2 oz (123 kg)   BMI 35.78 kg/m    Physical Exam Vitals and nursing note reviewed.  Constitutional:      Appearance: He is well-developed.  HENT:     Head: Normocephalic and atraumatic.  Eyes:     Conjunctiva/sclera: Conjunctivae normal.     Pupils: Pupils are equal, round, and reactive to light.  Cardiovascular:     Rate and Rhythm: Normal rate.  Abdominal:     Palpations: Abdomen is soft.  Musculoskeletal:     Cervical back: Normal range of motion.  Skin:    General: Skin is warm and dry.     Capillary Refill: Capillary refill takes less than 2 seconds.  Neurological:     Mental Status: He is alert and oriented to person, place, and time.  Psychiatric:        Behavior: Behavior normal.      Musculoskeletal Exam: C-spine, thoracic spine, and lumbar spine good ROM. No midline spinal tenderness.  No SI joint tenderness. Shoulder joints, elbow  joints, wrist joints, MCPs, PIPs, and DIPs good ROM with no synovitis.  Complete fist formation bilaterally.  PIP and DIP thickening consistent with osteoarthritis of both hands.  Knee joints good ROM with no warmth or effusion.  Ankle joints good ROM with no tenderness or inflammation.   CDAI Exam: CDAI Score: 0.4  Patient Global: 2 mm; Provider Global: 2 mm Swollen: 0 ; Tender: 0  Joint Exam 07/07/2020   No joint exam has been documented for this visit   There is currently no information documented on the homunculus. Go to the Rheumatology activity and complete the homunculus joint exam.  Investigation: No additional findings.  Imaging: No results found.  Recent Labs: Lab Results  Component Value Date   WBC 7.6 06/16/2020   HGB 14.5 06/16/2020   PLT 276 06/16/2020   NA 142 06/16/2020   K 5.0 06/16/2020   CL 103 06/16/2020   CO2 29 06/16/2020   GLUCOSE 120  (H) 06/16/2020   BUN 18 06/16/2020   CREATININE 0.82 06/16/2020   BILITOT 1.0 06/16/2020   ALKPHOS 61 06/16/2020   AST 14 06/16/2020   ALT 12 06/16/2020   PROT 7.0 06/16/2020   ALBUMIN 4.3 06/16/2020   CALCIUM 9.4 06/16/2020   GFRAA 110 06/16/2020    Speciality Comments: No specialty comments available.  Procedures:  No procedures performed Allergies: Patient has no known allergies.   Assessment / Plan:     Visit Diagnoses: Rheumatoid arthritis involving multiple sites with positive rheumatoid factor (HCC) - +RF,+anti-CCP,+ANA, with nodulosis: He has no joint tenderness or synovitis on exam. He has not had any recent rheumatoid arthritis flares. He is clinically doing well methotrexate 8 tablets by mouth once weekly and folic acid 2 mg by mouth daily. He discontinued methotrexate in April 2020 and restarted it in May 2021. 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 He has not had any recent infections. He has received both COVID-19 vaccinations and is considering receiving a third dose. He did not develop any increased joint pain or joint swelling while off of methotrexate. He continues to have morning stiffness lasting about 1 hour but has not had any nocturnal pain or difficulty with ADLs. He will continue taking methotrexate as prescribed by his PCP. He was advised to notify us if he develops increased joint pain or joint swelling. He will follow-up in the office in 5 months.  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  High risk medication use - Methotrexate 8 tablets by mouth every week and folic acid 2 mg by mouth daily. Results of CBC and CMP from 06/16/2020 were reviewed with the patient today in the office. All questions were addressed. His next lab work will be due in January and every 3 months to monitor for drug toxicity. Standing orders for CBC and CMP are in place. He has not had any recent infections. We discussed the importance of  holding methotrexate if he develops signs or symptoms of an infection and to resume once the infection has completely cleared. He has received both COVID-19 vaccinations and is planning on receiving the third dose. He was advised to hold methotrexate 1 week after receiving a third dose. He was also advised to avoid taking Tylenol and NSAIDs 24 hours prior to the third he was encouraged to continue to wear a mask and social distance. He was advised to notify us if he develops a COVID-19 infection in order to receive the monoclonal antibody infusion. He  voiced understanding.  History of positive PPD treated with INH   Primary osteoarthritis of both hands: He has PIP and DIP thickening consistent with osteoarthritis of both hands.  He has no joint tenderness or inflammation on examination today. He is able to make a complete fist bilaterally. Joint protection and muscle strengthening were discussed.  Primary osteoarthritis of both shoulders: He has good ROM of both shoulder joints with no discomfort.  No joint tenderness noted on exam. He has not been experiencing any nocturnal pain.   Primary osteoarthritis of both knees: He has good ROM of both knee joints with no discomfort.  No warmth or effusion noted on exam.  He has no difficulty rising from a seated position or climbing steps.   Other medical conditions are listed as follows:   Cervical disc disease  History of hyperlipidemia  Former smoker  Primary insomnia  History of depression  History of gastroesophageal reflux (GERD)  History of diabetes mellitus  History of hypothyroidism  Orders: No orders of the defined types were placed in this encounter.  No orders of the defined types were placed in this encounter.     Follow-Up Instructions: Return in about 1 year (around 07/07/2021) for Rheumatoid arthritis, Osteoarthritis.   Ofilia Neas, PA-C  Note - This record has been created using Dragon software.  Chart creation  errors have been sought, but may not always  have been located. Such creation errors do not reflect on  the standard of medical care.

## 2020-07-07 ENCOUNTER — Encounter: Payer: Self-pay | Admitting: Physician Assistant

## 2020-07-07 ENCOUNTER — Other Ambulatory Visit: Payer: Self-pay

## 2020-07-07 ENCOUNTER — Ambulatory Visit (INDEPENDENT_AMBULATORY_CARE_PROVIDER_SITE_OTHER): Payer: BC Managed Care – PPO | Admitting: Physician Assistant

## 2020-07-07 VITALS — BP 115/76 | HR 88 | Resp 18 | Ht 73.0 in | Wt 271.2 lb

## 2020-07-07 DIAGNOSIS — Z79899 Other long term (current) drug therapy: Secondary | ICD-10-CM | POA: Diagnosis not present

## 2020-07-07 DIAGNOSIS — M5090 Cervical disc disorder, unspecified, unspecified cervical region: Secondary | ICD-10-CM

## 2020-07-07 DIAGNOSIS — Z8719 Personal history of other diseases of the digestive system: Secondary | ICD-10-CM

## 2020-07-07 DIAGNOSIS — M0579 Rheumatoid arthritis with rheumatoid factor of multiple sites without organ or systems involvement: Secondary | ICD-10-CM | POA: Diagnosis not present

## 2020-07-07 DIAGNOSIS — M19041 Primary osteoarthritis, right hand: Secondary | ICD-10-CM

## 2020-07-07 DIAGNOSIS — Z9289 Personal history of other medical treatment: Secondary | ICD-10-CM | POA: Diagnosis not present

## 2020-07-07 DIAGNOSIS — Z8639 Personal history of other endocrine, nutritional and metabolic disease: Secondary | ICD-10-CM

## 2020-07-07 DIAGNOSIS — M0630 Rheumatoid nodule, unspecified site: Secondary | ICD-10-CM

## 2020-07-07 DIAGNOSIS — M19042 Primary osteoarthritis, left hand: Secondary | ICD-10-CM

## 2020-07-07 DIAGNOSIS — M19012 Primary osteoarthritis, left shoulder: Secondary | ICD-10-CM

## 2020-07-07 DIAGNOSIS — M170 Bilateral primary osteoarthritis of knee: Secondary | ICD-10-CM

## 2020-07-07 DIAGNOSIS — Z8659 Personal history of other mental and behavioral disorders: Secondary | ICD-10-CM

## 2020-07-07 DIAGNOSIS — M19011 Primary osteoarthritis, right shoulder: Secondary | ICD-10-CM

## 2020-07-07 DIAGNOSIS — F5101 Primary insomnia: Secondary | ICD-10-CM

## 2020-07-07 DIAGNOSIS — Z87891 Personal history of nicotine dependence: Secondary | ICD-10-CM

## 2020-07-07 NOTE — Patient Instructions (Signed)
Standing Labs We placed an order today for your standing lab work.   Please have your standing labs drawn in January and every 3 months   If possible, please have your labs drawn 2 weeks prior to your appointment so that the provider can discuss your results at your appointment.  We have open lab daily Monday through Thursday from 8:30-12:30 PM and 1:30-4:30 PM and Friday from 8:30-12:30 PM and 1:30-4:00 PM at the office of Dr. Bo Merino, [facility].   Please be advised, patients with office appointments requiring lab work will take precedents over walk-in lab work.  If possible, please come for your lab work on Monday and Friday afternoons, as you may experience shorter wait times. The office is located at [address] [place], [place], [place] [address] No appointment is necessary.   Labs are drawn by [facility]. Please bring your co-pay at the time of your lab draw.  You may receive a bill from [facility] for your lab work.  If you wish to have your labs drawn at another location, please call the office 24 hours in advance to send orders.  If you have any questions regarding directions or hours of operation,  please call [phone].   As a reminder, please drink plenty of water prior to coming for your lab work. Thanks! COVID-19 vaccine recommendations:   COVID-19 vaccine is recommended for everyone (unless you are allergic to a vaccine component), even if you are on a medication that suppresses your immune system.   If you are on Methotrexate, Cellcept (mycophenolate), Rinvoq, Morrie Sheldon, and Olumiant- hold the medication for 1 week after each vaccine. Hold Methotrexate for 2 weeks after the single dose COVID-19 vaccine.   If you are on Orencia subcutaneous injection - hold medication one week prior to and one week after the first COVID-19 vaccine dose (only).   If you are on Orencia IV infusions- time vaccination administration so that the first COVID-19 vaccination  will occur four weeks after the infusion and postpone the subsequent infusion by one week.   If you are on Cyclophosphamide or Rituxan infusions please contact your doctor prior to receiving the COVID-19 vaccine.   Do not take Tylenol or any anti-inflammatory medications (NSAIDs) 24 hours prior to the COVID-19 vaccination.   There is no direct evidence about the efficacy of the COVID-19 vaccine in individuals who are on medications that suppress the immune system.   Even if you are fully vaccinated, and you are on any medications that suppress your immune system, please continue to wear a mask, maintain at least six feet social distance and practice hand hygiene.   If you develop a COVID-19 infection, please contact your PCP or our office to determine if you need antibody infusion.  The booster vaccine is now available for immunocompromised patients. It is advised that if you had Pfizer vaccine you should get [employer] booster.  If you had a Moderna vaccine then you should get a Moderna booster. Johnson and Wynetta Emery does not have a booster vaccine at this time.  Please see the following web sites for updated information.   https://[email or web address]/[email or web address]/0/Files/COVID-19-Vaccination-Patient-Resources.pdf  https://[email or web address].org/About-Us/Newsroom/Press-Releases/ID/1159

## 2020-08-28 ENCOUNTER — Ambulatory Visit: Payer: BLUE CROSS/BLUE SHIELD | Admitting: Rheumatology

## 2020-09-29 ENCOUNTER — Other Ambulatory Visit: Payer: Self-pay | Admitting: *Deleted

## 2020-09-29 ENCOUNTER — Telehealth: Payer: Self-pay

## 2020-09-29 DIAGNOSIS — Z79899 Other long term (current) drug therapy: Secondary | ICD-10-CM | POA: Diagnosis not present

## 2020-09-29 DIAGNOSIS — K219 Gastro-esophageal reflux disease without esophagitis: Secondary | ICD-10-CM | POA: Diagnosis not present

## 2020-09-29 DIAGNOSIS — Z6841 Body Mass Index (BMI) 40.0 and over, adult: Secondary | ICD-10-CM | POA: Diagnosis not present

## 2020-09-29 DIAGNOSIS — E7849 Other hyperlipidemia: Secondary | ICD-10-CM | POA: Diagnosis not present

## 2020-09-29 DIAGNOSIS — I10 Essential (primary) hypertension: Secondary | ICD-10-CM | POA: Diagnosis not present

## 2020-09-29 DIAGNOSIS — E119 Type 2 diabetes mellitus without complications: Secondary | ICD-10-CM | POA: Diagnosis not present

## 2020-09-29 DIAGNOSIS — R519 Headache, unspecified: Secondary | ICD-10-CM | POA: Diagnosis not present

## 2020-09-29 NOTE — Telephone Encounter (Signed)
Lab Orders released.  

## 2020-09-29 NOTE — Telephone Encounter (Signed)
Patient is requesting his labwork orders be sent to [facility] in [place].  Patient will be going at 9:00 am.

## 2020-09-30 LAB — CMP14+EGFR
ALT: 15 IU/L (ref 0–44)
AST: 20 IU/L (ref 0–40)
Albumin/Globulin Ratio: 1.8 (ref 1.2–2.2)
Albumin: 4.6 g/dL (ref 3.8–4.8)
Alkaline Phosphatase: 59 IU/L (ref 44–121)
BUN/Creatinine Ratio: 23 (ref 10–24)
BUN: 20 mg/dL (ref 8–27)
Bilirubin Total: 0.9 mg/dL (ref 0.0–1.2)
CO2: 29 mmol/L (ref 20–29)
Calcium: 9.8 mg/dL (ref 8.6–10.2)
Chloride: 101 mmol/L (ref 96–106)
Creatinine, Ser: 0.86 mg/dL (ref 0.76–1.27)
GFR calc Af Amer: 108 mL/min/{1.73_m2} (ref >59)
GFR calc non Af Amer: 94 mL/min/{1.73_m2} (ref >59)
Globulin, Total: 2.6 g/dL (ref 1.5–4.5)
Glucose: 133 mg/dL — ABNORMAL HIGH (ref 65–99)
Potassium: 5.1 mmol/L (ref 3.5–5.2)
Sodium: 143 mmol/L (ref 134–144)
Total Protein: 7.2 g/dL (ref 6.0–8.5)

## 2020-09-30 LAB — CBC WITH DIFFERENTIAL/PLATELET
Basophils Absolute: 0 10*3/uL (ref 0.0–0.2)
Basos: 0 %
EOS (ABSOLUTE): 0.1 10*3/uL (ref 0.0–0.4)
Eos: 1 %
Hematocrit: 45.8 % (ref 37.5–51.0)
Hemoglobin: 15.1 g/dL (ref 13.0–17.7)
Immature Grans (Abs): 0 10*3/uL (ref 0.0–0.1)
Immature Granulocytes: 0 %
Lymphocytes Absolute: 1.6 10*3/uL (ref 0.7–3.1)
Lymphs: 17 %
MCH: 33 pg (ref 26.6–33.0)
MCHC: 33 g/dL (ref 31.5–35.7)
MCV: 100 fL — ABNORMAL HIGH (ref 79–97)
Monocytes Absolute: 0.5 10*3/uL (ref 0.1–0.9)
Monocytes: 6 %
Neutrophils Absolute: 7.2 10*3/uL — ABNORMAL HIGH (ref 1.4–7.0)
Neutrophils: 76 %
Platelets: 300 10*3/uL (ref 150–450)
RBC: 4.58 x10E6/uL (ref 4.14–5.80)
RDW: 12.6 % (ref 11.6–15.4)
WBC: 9.5 10*3/uL (ref 3.4–10.8)

## 2020-09-30 NOTE — Progress Notes (Signed)
CBC and CMP are stable. Glucose is mildly elevated probably not a fasting sample.

## 2020-10-18 DIAGNOSIS — Z6836 Body mass index (BMI) 36.0-36.9, adult: Secondary | ICD-10-CM | POA: Diagnosis not present

## 2020-10-18 DIAGNOSIS — I10 Essential (primary) hypertension: Secondary | ICD-10-CM | POA: Diagnosis not present

## 2020-10-18 DIAGNOSIS — K219 Gastro-esophageal reflux disease without esophagitis: Secondary | ICD-10-CM | POA: Diagnosis not present

## 2020-10-18 DIAGNOSIS — R519 Headache, unspecified: Secondary | ICD-10-CM | POA: Diagnosis not present

## 2020-10-18 DIAGNOSIS — E063 Autoimmune thyroiditis: Secondary | ICD-10-CM | POA: Diagnosis not present

## 2020-10-18 DIAGNOSIS — M069 Rheumatoid arthritis, unspecified: Secondary | ICD-10-CM | POA: Diagnosis not present

## 2020-10-19 ENCOUNTER — Other Ambulatory Visit: Payer: Self-pay | Admitting: Internal Medicine

## 2020-10-19 ENCOUNTER — Other Ambulatory Visit (HOSPITAL_COMMUNITY): Payer: Self-pay | Admitting: Internal Medicine

## 2020-10-19 DIAGNOSIS — R519 Headache, unspecified: Secondary | ICD-10-CM

## 2020-10-19 DIAGNOSIS — J323 Chronic sphenoidal sinusitis: Secondary | ICD-10-CM

## 2020-10-27 ENCOUNTER — Other Ambulatory Visit: Payer: Self-pay

## 2020-10-27 ENCOUNTER — Ambulatory Visit (HOSPITAL_COMMUNITY)
Admission: RE | Admit: 2020-10-27 | Discharge: 2020-10-27 | Disposition: A | Discharge status: Home / Self Care | Payer: BC Managed Care – PPO | Source: Ambulatory Visit | Attending: Internal Medicine | Admitting: Internal Medicine

## 2020-10-27 DIAGNOSIS — R519 Headache, unspecified: Secondary | ICD-10-CM

## 2020-10-27 DIAGNOSIS — J323 Chronic sphenoidal sinusitis: Secondary | ICD-10-CM | POA: Diagnosis not present

## 2020-12-22 DIAGNOSIS — G43701 Chronic migraine without aura, not intractable, with status migrainosus: Secondary | ICD-10-CM | POA: Diagnosis not present

## 2021-01-05 ENCOUNTER — Telehealth: Payer: Self-pay | Admitting: Rheumatology

## 2021-01-05 DIAGNOSIS — Z79899 Other long term (current) drug therapy: Secondary | ICD-10-CM

## 2021-01-05 NOTE — Telephone Encounter (Signed)
Lab orders released for [facility]. I called patient to advise.

## 2021-01-05 NOTE — Telephone Encounter (Signed)
Patient going to [facility] in [place] for lab draw this am. Please release orders.

## 2021-01-12 DIAGNOSIS — Z6836 Body mass index (BMI) 36.0-36.9, adult: Secondary | ICD-10-CM | POA: Diagnosis not present

## 2021-01-12 DIAGNOSIS — Z125 Encounter for screening for malignant neoplasm of prostate: Secondary | ICD-10-CM | POA: Diagnosis not present

## 2021-01-12 DIAGNOSIS — I10 Essential (primary) hypertension: Secondary | ICD-10-CM | POA: Diagnosis not present

## 2021-01-12 DIAGNOSIS — E119 Type 2 diabetes mellitus without complications: Secondary | ICD-10-CM | POA: Diagnosis not present

## 2021-01-12 DIAGNOSIS — Z79899 Other long term (current) drug therapy: Secondary | ICD-10-CM | POA: Diagnosis not present

## 2021-01-12 DIAGNOSIS — E6609 Other obesity due to excess calories: Secondary | ICD-10-CM | POA: Diagnosis not present

## 2021-01-12 DIAGNOSIS — E7849 Other hyperlipidemia: Secondary | ICD-10-CM | POA: Diagnosis not present

## 2021-01-13 LAB — CBC WITH DIFFERENTIAL/PLATELET
Basophils Absolute: 0.1 10*3/uL (ref 0.0–0.2)
Basos: 1 %
EOS (ABSOLUTE): 0.2 10*3/uL (ref 0.0–0.4)
Eos: 2 %
Hematocrit: 43 % (ref 37.5–51.0)
Hemoglobin: 14.4 g/dL (ref 13.0–17.7)
Immature Grans (Abs): 0 10*3/uL (ref 0.0–0.1)
Immature Granulocytes: 0 %
Lymphocytes Absolute: 1.8 10*3/uL (ref 0.7–3.1)
Lymphs: 22 %
MCH: 33.3 pg — ABNORMAL HIGH (ref 26.6–33.0)
MCHC: 33.5 g/dL (ref 31.5–35.7)
MCV: 100 fL — ABNORMAL HIGH (ref 79–97)
Monocytes Absolute: 0.6 10*3/uL (ref 0.1–0.9)
Monocytes: 7 %
Neutrophils Absolute: 5.4 10*3/uL (ref 1.4–7.0)
Neutrophils: 68 %
Platelets: 286 10*3/uL (ref 150–450)
RBC: 4.32 x10E6/uL (ref 4.14–5.80)
RDW: 12.5 % (ref 11.6–15.4)
WBC: 8.1 10*3/uL (ref 3.4–10.8)

## 2021-01-13 LAB — CMP14+EGFR
ALT: 16 IU/L (ref 0–44)
AST: 12 IU/L (ref 0–40)
Albumin/Globulin Ratio: 1.6 (ref 1.2–2.2)
Albumin: 4.3 g/dL (ref 3.8–4.8)
Alkaline Phosphatase: 72 IU/L (ref 44–121)
BUN/Creatinine Ratio: 20 (ref 10–24)
BUN: 22 mg/dL (ref 8–27)
Bilirubin Total: 0.6 mg/dL (ref 0.0–1.2)
CO2: 24 mmol/L (ref 20–29)
Calcium: 9.1 mg/dL (ref 8.6–10.2)
Chloride: 107 mmol/L — ABNORMAL HIGH (ref 96–106)
Creatinine, Ser: 1.08 mg/dL (ref 0.76–1.27)
Globulin, Total: 2.7 g/dL (ref 1.5–4.5)
Glucose: 139 mg/dL — ABNORMAL HIGH (ref 65–99)
Potassium: 4.9 mmol/L (ref 3.5–5.2)
Sodium: 143 mmol/L (ref 134–144)
Total Protein: 7 g/dL (ref 6.0–8.5)
eGFR: 78 mL/min/{1.73_m2} (ref >59)

## 2021-01-13 NOTE — Telephone Encounter (Signed)
Glucose is elevated.  CBC is normal except MCV is mildly elevated.  Please advise patient to take folic acid 2 mg daily.

## 2021-03-04 ENCOUNTER — Other Ambulatory Visit: Payer: Self-pay

## 2021-03-04 ENCOUNTER — Observation Stay (HOSPITAL_COMMUNITY)
Admission: EM | Admit: 2021-03-04 | Discharge: 2021-03-05 | Disposition: A | Discharge status: Home / Self Care | Payer: BC Managed Care – PPO | Attending: Internal Medicine | Admitting: Internal Medicine

## 2021-03-04 ENCOUNTER — Emergency Department (HOSPITAL_COMMUNITY): Payer: BC Managed Care – PPO

## 2021-03-04 ENCOUNTER — Encounter (HOSPITAL_COMMUNITY): Payer: Self-pay | Admitting: *Deleted

## 2021-03-04 DIAGNOSIS — Z7984 Long term (current) use of oral hypoglycemic drugs: Secondary | ICD-10-CM | POA: Insufficient documentation

## 2021-03-04 DIAGNOSIS — Z79899 Other long term (current) drug therapy: Secondary | ICD-10-CM | POA: Diagnosis not present

## 2021-03-04 DIAGNOSIS — I10 Essential (primary) hypertension: Secondary | ICD-10-CM | POA: Insufficient documentation

## 2021-03-04 DIAGNOSIS — M069 Rheumatoid arthritis, unspecified: Secondary | ICD-10-CM | POA: Diagnosis present

## 2021-03-04 DIAGNOSIS — E119 Type 2 diabetes mellitus without complications: Secondary | ICD-10-CM | POA: Diagnosis not present

## 2021-03-04 DIAGNOSIS — R0789 Other chest pain: Principal | ICD-10-CM | POA: Insufficient documentation

## 2021-03-04 DIAGNOSIS — J439 Emphysema, unspecified: Secondary | ICD-10-CM | POA: Diagnosis not present

## 2021-03-04 DIAGNOSIS — M0579 Rheumatoid arthritis with rheumatoid factor of multiple sites without organ or systems involvement: Secondary | ICD-10-CM

## 2021-03-04 DIAGNOSIS — R110 Nausea: Secondary | ICD-10-CM | POA: Diagnosis not present

## 2021-03-04 DIAGNOSIS — K219 Gastro-esophageal reflux disease without esophagitis: Secondary | ICD-10-CM | POA: Diagnosis present

## 2021-03-04 DIAGNOSIS — Z720 Tobacco use: Secondary | ICD-10-CM | POA: Diagnosis present

## 2021-03-04 DIAGNOSIS — Z8249 Family history of ischemic heart disease and other diseases of the circulatory system: Secondary | ICD-10-CM

## 2021-03-04 DIAGNOSIS — R079 Chest pain, unspecified: Secondary | ICD-10-CM

## 2021-03-04 DIAGNOSIS — E1122 Type 2 diabetes mellitus with diabetic chronic kidney disease: Secondary | ICD-10-CM

## 2021-03-04 DIAGNOSIS — J449 Chronic obstructive pulmonary disease, unspecified: Secondary | ICD-10-CM | POA: Insufficient documentation

## 2021-03-04 DIAGNOSIS — Z20822 Contact with and (suspected) exposure to covid-19: Secondary | ICD-10-CM | POA: Diagnosis not present

## 2021-03-04 DIAGNOSIS — Z87891 Personal history of nicotine dependence: Secondary | ICD-10-CM | POA: Insufficient documentation

## 2021-03-04 DIAGNOSIS — R61 Generalized hyperhidrosis: Secondary | ICD-10-CM | POA: Diagnosis not present

## 2021-03-04 LAB — CBC
HCT: 44.7 % (ref 39.0–52.0)
Hemoglobin: 14.5 g/dL (ref 13.0–17.0)
MCH: 33.9 pg (ref 26.0–34.0)
MCHC: 32.4 g/dL (ref 30.0–36.0)
MCV: 104.4 fL — ABNORMAL HIGH (ref 80.0–100.0)
Platelets: 242 10*3/uL (ref 150–400)
RBC: 4.28 MIL/uL (ref 4.22–5.81)
RDW: 13.4 % (ref 11.5–15.5)
WBC: 7.4 10*3/uL (ref 4.0–10.5)
nRBC: 0 % (ref 0.0–0.2)

## 2021-03-04 LAB — RESP PANEL BY RT-PCR (FLU A&B, COVID) ARPGX2
Influenza A by PCR: NEGATIVE
Influenza B by PCR: NEGATIVE
SARS Coronavirus 2 by RT PCR: NEGATIVE

## 2021-03-04 LAB — BASIC METABOLIC PANEL WITH GFR
Anion gap: 6 (ref 5–15)
BUN: 27 mg/dL — ABNORMAL HIGH (ref 8–23)
CO2: 33 mmol/L — ABNORMAL HIGH (ref 22–32)
Calcium: 9.2 mg/dL (ref 8.9–10.3)
Chloride: 102 mmol/L (ref 98–111)
Creatinine, Ser: 0.82 mg/dL (ref 0.61–1.24)
GFR, Estimated: >60 mL/min (ref >60)
Glucose, Bld: 143 mg/dL — ABNORMAL HIGH (ref 70–99)
Potassium: 4.4 mmol/L (ref 3.5–5.1)
Sodium: 141 mmol/L (ref 135–145)

## 2021-03-04 LAB — D-DIMER, QUANTITATIVE: D-Dimer, Quant: 0.4 ug/mL-FEU (ref 0.00–0.50)

## 2021-03-04 LAB — TROPONIN I (HIGH SENSITIVITY)
Troponin I (High Sensitivity): 5 ng/L (ref <18)
Troponin I (High Sensitivity): 4 ng/L (ref <18)
Troponin I (High Sensitivity): 5 ng/L (ref <18)

## 2021-03-04 LAB — CBG MONITORING, ED: Glucose-Capillary: 162 mg/dL — ABNORMAL HIGH (ref 70–99)

## 2021-03-04 MED ORDER — PANTOPRAZOLE SODIUM 40 MG PO TBEC
40.0000 mg | DELAYED_RELEASE_TABLET | Freq: Every day | ORAL | Status: DC
  Administered 2021-03-05: 40 mg via ORAL
  Filled 2021-03-04: qty 1

## 2021-03-04 MED ORDER — INSULIN ASPART 100 UNIT/ML IJ SOLN: 0.0000 [IU] | Freq: Every day | INTRAMUSCULAR | Status: DC

## 2021-03-04 MED ORDER — ENOXAPARIN SODIUM 40 MG/0.4ML IJ SOSY
40.0000 mg | PREFILLED_SYRINGE | INTRAMUSCULAR | Status: DC
  Filled 2021-03-04: qty 0.4

## 2021-03-04 MED ORDER — ZOLPIDEM TARTRATE 5 MG PO TABS
10.0000 mg | ORAL_TABLET | Freq: Every evening | ORAL | Status: DC | PRN
  Administered 2021-03-04: 10 mg via ORAL
  Filled 2021-03-04: qty 2

## 2021-03-04 MED ORDER — ACETAMINOPHEN 325 MG PO TABS
650.0000 mg | ORAL_TABLET | ORAL | Status: DC | PRN
  Administered 2021-03-04 – 2021-03-05 (×2): 650 mg via ORAL
  Filled 2021-03-04 (×2): qty 2

## 2021-03-04 MED ORDER — INSULIN ASPART 100 UNIT/ML IJ SOLN: 0.0000 [IU] | Freq: Three times a day (TID) | INTRAMUSCULAR | Status: DC

## 2021-03-04 MED ORDER — IOHEXOL 300 MG/ML  SOLN
75.0000 mL | Freq: Once | INTRAMUSCULAR | Status: AC | PRN
  Administered 2021-03-04: 75 mL via INTRAVENOUS

## 2021-03-04 MED ORDER — INSULIN GLARGINE 100 UNIT/ML ~~LOC~~ SOLN
15.0000 [IU] | Freq: Every day | SUBCUTANEOUS | Status: DC
  Filled 2021-03-04 (×2): qty 0.15

## 2021-03-04 MED ORDER — ALPRAZOLAM 1 MG PO TABS
1.0000 mg | ORAL_TABLET | Freq: Three times a day (TID) | ORAL | Status: DC | PRN
  Administered 2021-03-04: 1 mg via ORAL
  Filled 2021-03-04: qty 2

## 2021-03-04 MED ORDER — FOLIC ACID 1 MG PO TABS
1.0000 mg | ORAL_TABLET | Freq: Two times a day (BID) | ORAL | Status: DC
  Administered 2021-03-04 – 2021-03-05 (×2): 1 mg via ORAL
  Filled 2021-03-04 (×2): qty 1

## 2021-03-04 MED ORDER — LEVOTHYROXINE SODIUM 50 MCG PO TABS
50.0000 ug | ORAL_TABLET | Freq: Every day | ORAL | Status: DC
  Administered 2021-03-05: 50 ug via ORAL
  Filled 2021-03-04: qty 1

## 2021-03-04 MED ORDER — ATORVASTATIN CALCIUM 10 MG PO TABS
10.0000 mg | ORAL_TABLET | Freq: Every day | ORAL | Status: DC
  Administered 2021-03-05: 10 mg via ORAL
  Filled 2021-03-04: qty 1

## 2021-03-04 MED ORDER — ONDANSETRON HCL 4 MG/2ML IJ SOLN: 4.0000 mg | Freq: Four times a day (QID) | INTRAMUSCULAR | Status: DC | PRN

## 2021-03-04 MED ORDER — NICOTINE 14 MG/24HR TD PT24: 14.0000 mg | MEDICATED_PATCH | Freq: Every day | TRANSDERMAL | Status: DC | PRN

## 2021-03-04 NOTE — ED Triage Notes (Signed)
Pt c/o mid chest pain radiating down left arm, diaphoresis, nausea x 1-2 weeks. He reports this pain has kept him up all night. Pt also c/o headache x 6 months. Pt reports being under a lot of stress lately.

## 2021-03-04 NOTE — ED Notes (Signed)
Patient transported to CT 

## 2021-03-04 NOTE — ED Provider Notes (Signed)
[facility] Provider Note   CSN: [id number] Arrival date & time: 03/04/21  0759     History Chief Complaint  Patient presents with   Chest Pain    GOBLE FUDALA is a 60 y.o. male.  HPI  60 year old male with history of anxiety, COPD, depression, hypertension, GERD, hiatal hernia, ITP, osteoarthritis, diabetes, who presents to the emergency department today for evaluation of chest pain.  Patient states that he has had chest pain intermittently for the last several weeks however over the last 24 hours the pain has been more constant and severe.  Last night the pain kept him up all night.  He describes the pain as a dull ache/pressure and he currently rates it a 3/10.  He reports the pain radiates to the left upper extremity and he also had some associated diaphoresis and nausea last night.  He denies any vomiting, shortness of breath, pleuritic pain, leg swelling, calf pain.  Has history of tobacco use but none currently.  Reports significant early family history of cardiac disease.  His father died of heart disease at 96, his brother died of heart disease at 7 and 2 of his sisters died before the age of 61.  He states he has had many cardiac work-ups that his last cath was not since 2017.  Past Medical History:  Diagnosis Date   Anxiety    COPD (chronic obstructive pulmonary disease) ([place])    Depression    Essential hypertension    GERD (gastroesophageal reflux disease)    H/O hiatal hernia    History of cardiac catheterization    Normal coronaries May 2017   History of ITP    Childhood   Osteoarthritis    Type 2 diabetes mellitus (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   History of positive PPD treated with INH  04/14/2017   Abscess of left olecranon bursa 11/07/2016   High risk medication use 11/07/2016   Cervical disc disease  11/07/2016   Primary osteoarthritis of both hands 11/07/2016   Primary osteoarthritis of both knees 11/07/2016   Primary osteoarthritis of both shoulders 11/07/2016   Precordial pain 01/22/2016   Pain in the chest    Hyperlipidemia 01/20/2016   Depression with anxiety 01/20/2016   GERD (gastroesophageal reflux disease) 01/20/2016   Type 2 diabetes mellitus ([place]) 01/20/2016   Chest pain at rest 09/22/2014   Tobacco use 09/22/2014   Morbid obesity ([place]) 09/22/2014   Rheumatoid arthritis ([place]) 09/22/2014   Family history of heart disease 09/22/2014   Chest pain 09/22/2014    Past Surgical History:  Procedure Laterality Date   ANTERIOR CERVICAL DECOMP/DISCECTOMY FUSION  01/21/2012   Procedure: ANTERIOR CERVICAL DECOMPRESSION/DISCECTOMY FUSION 2 LEVELS;  Surgeon: Erline Levine, MD;  Location: [facility];  Service: Neurosurgery;  Laterality: N/A;  Cervical Six-Seven, Cervical Seven-Thoracic One, Anterior Cervical Decompression with Fusion Interbody Prothesis Plating and Bonegraft possible posterior Cervical Seven-Thoracic One Foraminotomy   CARDIAC CATHETERIZATION  [phone]   normal coronary arteries   CARDIAC CATHETERIZATION N/A 01/22/2016   Procedure: Left Heart Cath and Coronary Angiography;  Surgeon: Burnell Blanks, MD;  Location: [facility];  Service: Cardiovascular;  Laterality: N/A;   CARPAL TUNNEL RELEASE Bilateral    CERVICAL DISC SURGERY  04   CHOLECYSTECTOMY N/A 10/26/2014   Procedure: LAPAROSCOPIC CHOLECYSTECTOMY;  Surgeon: Jamesetta So,  MD;  Location: [facility];  Service: General;  Laterality: N/A;   ESOPHAGOGASTRODUODENOSCOPY N/A 05/03/2016   Procedure: ESOPHAGOGASTRODUODENOSCOPY (EGD);  Surgeon: Rogene Houston, MD;  Location: [facility];  Service: Endoscopy;  Laterality: N/A;  10:30   HERNIA REPAIR Right 60's   Left elbow bursa removed     TONSILLECTOMY  31       Family History  Problem Relation Age of Onset   Heart disease Mother    COPD Mother     Heart attack Father        Sudden death   Heart attack Sister 77   Kidney disease Sister    Heart disease Sister    Heart attack Brother    Heart disease Brother    Diabetes Brother    Heart attack Brother    Heart disease Brother 88       CABG   Heart attack Brother 76    Social History   Tobacco Use   Smoking status: Every Day    Packs/day: 1.00    Years: 30.00    Pack years: 30.00    Types: Cigarettes    Start date: 03/13/1974    Last attempt to quit: 09/16/2017    Years since quitting: 3.4   Smokeless tobacco: Never  Vaping Use   Vaping Use: Never used  Substance Use Topics   Alcohol use: No    Alcohol/week: 0.0 standard drinks   Drug use: Never    Home Medications Prior to Admission medications   Medication Sig Start Date End Date Taking? Authorizing Provider  acetaminophen (TYLENOL) 500 MG tablet Take 500 mg by mouth every 6 (six) hours as needed for mild pain.    [provider]  ALPRAZolam Duanne Moron) 1 MG tablet Take 1 mg by mouth 3 (three) times daily as needed for anxiety.    [provider]  Ascorbic Acid (VITAMIN C) 500 MG CAPS Take by mouth.    [provider]  Cholecalciferol (DIALYVITE VITAMIN D 5000 PO) Take by mouth.    [provider]  folic acid (FOLVITE) 1 MG tablet TAKE 1 TABLET BY MOUTH TWICE DAILY 12/14/18   Bo Merino, MD  levothyroxine (SYNTHROID, LEVOTHROID) 50 MCG tablet Take 50 mcg by mouth daily. 02/23/18   [provider]  lisinopril (ZESTRIL) 2.5 MG tablet Take 2.5 mg by mouth daily. 04/07/20   [provider]  metFORMIN (GLUCOPHAGE) 500 MG tablet Take 500 mg by mouth 2 (two) times daily with a meal.    [provider]  methotrexate (RHEUMATREX) 2.5 MG tablet TAKE EIGHT TABLETS BY MOUTH ONCE WEEKLY 09/04/18   Ofilia Neas, [facility] [name]  pantoprazole (PROTONIX) 40 MG tablet Take 40 mg by mouth daily.    [provider]  sildenafil (REVATIO) 20 MG tablet Take 20 mg by mouth  daily as needed. 06/16/20   [provider]  Zinc 15 MG CAPS Take by mouth.    [provider]  zolpidem (AMBIEN) 10 MG tablet Take 10 mg by mouth at bedtime as needed for sleep. Reported on 01/20/2016    [provider]    Allergies    Patient has no known allergies.  Review of Systems   Review of Systems  Constitutional:  Positive for diaphoresis. Negative for chills and fever.  HENT:  Negative for ear pain and sore throat.   Eyes:  Negative for pain and visual disturbance.  Respiratory:  Negative for cough and shortness of breath.   Cardiovascular:  Positive for chest pain. Negative for palpitations.  Gastrointestinal:  Positive for nausea. Negative for abdominal pain, constipation, diarrhea and vomiting.  Genitourinary:  Negative for dysuria and hematuria.  Musculoskeletal:  Negative for back pain.  Skin:  Negative for color change and rash.  Neurological:  Negative for headaches.  All other systems reviewed and are negative.  Physical Exam Updated Vital Signs BP 119/71   Pulse (!) 56   Temp 97.9 F (36.6 C) (Oral)   Resp 15   Ht 6\' 1"  (1.854 m)   Wt 122.5 kg   SpO2 95%   BMI 35.62 kg/m   Physical Exam Vitals and nursing note reviewed.  Constitutional:      Appearance: He is well-developed.  HENT:     Head: Normocephalic and atraumatic.  Eyes:     Conjunctiva/sclera: Conjunctivae normal.  Cardiovascular:     Rate and Rhythm: Normal rate and regular rhythm.     Heart sounds: Normal heart sounds. No murmur heard. Pulmonary:     Effort: Pulmonary effort is normal. No respiratory distress.     Breath sounds: Normal breath sounds. No decreased breath sounds.  Abdominal:     Palpations: Abdomen is soft.     Tenderness: There is no abdominal tenderness.  Musculoskeletal:     Cervical back: Neck supple.     Right lower leg: No tenderness. No edema.     Left lower leg: No tenderness. No edema.  Skin:    General: Skin is warm and dry.   Neurological:     Mental Status: He is alert.    ED Results / Procedures / Treatments   Labs (all labs ordered are listed, but only abnormal results are displayed) Labs Reviewed  BASIC METABOLIC PANEL - Abnormal; Notable for the following components:      Result Value   CO2 33 (*)    Glucose, Bld 143 (*)    BUN 27 (*)    All other components within normal limits  CBC - Abnormal; Notable for the following components:   MCV 104.4 (*)    All other components within normal limits  RESP PANEL BY RT-PCR (FLU A&B, COVID) ARPGX2  D-DIMER, QUANTITATIVE  TROPONIN I (HIGH SENSITIVITY)  TROPONIN I (HIGH SENSITIVITY)    EKG None  Radiology DG Chest 2 View  Result Date: 03/04/2021 CLINICAL DATA:  Chest pain. Mid chest pain radiating to left arm, diaphoresis, nausea for 1-2 weeks. EXAM: CHEST - 2 VIEW COMPARISON:  Prior chest radiographs 01/26/2018 and earlier. FINDINGS: Heart size within normal limits. Apparent nodule within the right lung base measuring at least 6 mm (nipple shadow considered less likely). No appreciable airspace consolidation. No evidence of pleural effusion or pneumothorax. No acute bony abnormality identified. Degenerative changes of the spine. Partially visualized ACDF hardware. Surgical clips within the upper abdomen. IMPRESSION: Apparent nodule within the right lung base measuring at least 6 mm (nipple shadow considered less likely). A chest CT is recommended for further evaluation (this may be nonemergent). Elsewhere, there is no appreciable airspace consolidation. Heart size within normal limits. Electronically Signed   By: Kellie Simmering DO   On: 03/04/2021 10:22   CT Chest W Contrast  Result Date: 03/04/2021 CLINICAL DATA:  Normal x-ray. EXAM: CT CHEST WITH CONTRAST TECHNIQUE: Multidetector CT imaging of the chest was performed during intravenous contrast administration. CONTRAST:  38mL OMNIPAQUE IOHEXOL 300 MG/ML  SOLN COMPARISON:  November 04, 2011 FINDINGS:  Cardiovascular: No significant vascular findings. Normal heart size. No pericardial  effusion. Mediastinum/Nodes: No enlarged mediastinal, hilar, or axillary lymph nodes. Thyroid gland, trachea, and esophagus demonstrate no significant findings. Lungs/Pleura: Upper lobe predominant emphysema. No pulmonary nodules seen. Upper Abdomen: No acute abnormality. Musculoskeletal: No chest wall abnormality. No acute or significant osseous findings. Prior cervical and thoracic spine fusion. IMPRESSION: 1. Upper lobe predominant emphysema, moderate to severe. 2. No evidence of pulmonary nodules. Emphysema (ICD10-J43.9). Electronically Signed   By: Fidela Salisbury M.D.   On: 03/04/2021 13:21    Procedures Procedures   382 AM Cardiac monitoring reveals NSR, HR 75 (Rate & rhythm), as reviewed and interpreted by me. Cardiac monitoring was ordered due to chest pain and to monitor patient for dysrhythmia.   Medications Ordered in ED Medications  iohexol (OMNIPAQUE) 300 MG/ML solution 75 mL (75 mLs Intravenous Contrast Given 03/04/21 1242)    ED Course  I have reviewed the triage vital signs and the nursing notes.  Pertinent labs & imaging results that were available during my care of the patient were reviewed by me and considered in my medical decision making (see chart for details).    MDM Rules/Calculators/A&P                          60 year old male presenting the emergency department today for evaluation of chest pain with radiation to the left upper extremity associate with nausea and diaphoresis.  No associated shortness of breath.  Reviewed/interpreted labs CBC unremarkable BMP with mildly elevated bicarb at 33, mildly elevated BUN at 27, otherwise normal creatinine normal electrolytes Delta troponins are negative D-dimer is negative therefore low suspicion for PE  COVID/flu testing negative  EKG - NSR, low voltage QRS. No STEMI  Reviewed/interpreted imaging  CXR - Apparent nodule within  the right lung base measuring at least 6 mm (nipple shadow considered less likely). Elsewhere, there is no appreciable airspace consolidation. Heart size within normal limits. CT chest - IMPRESSION: 1. Upper lobe predominant emphysema, moderate to severe. 2. No evidence of pulmonary nodules.  Patient's cardiac work-up thus far is reassuring however given his significant family history we will touch base with cardiology for further recommendations  3:00 PM CONSULT with Dr. Harl Bowie with cardiology who recommends admission to [facility] and recommends patient have echo and stress test given that his last cardiac work-up has not been for the last 5 years.  3:43 CONSULT with Dr. Nehemiah Settle who accepts patient for admission    Final Clinical Impression(s) / ED Diagnoses Final diagnoses:  Chest pain, unspecified type    Rx / DC Orders ED Discharge Orders     None        Rodney Booze, [name] 03/04/21 1545    Milton Ferguson, MD 03/05/21 [phone]

## 2021-03-04 NOTE — H&P (Signed)
History and Physical  Dustin [name] [id number]:[id number] DOB: 12-03-1960 DOA: 03/04/2021  Referring physician: Cecilie Lowers, PA-C, EDP PCP: Redmond School, MD  Outpatient Specialists:   Patient Coming From: home  Chief Complaint: chest pain  HPI: Dustin [name] is a 60 y.o. male with a history of type 2 diabetes, hypertension, hyperlipidemia, tobacco abuse, family history of early MI including his father who died at 23, a brother who died at 58 from heart disease, and 2 sisters died before the age of 52 due to heart disease.  The patient presents with chest pain that had been intermittent over the past several weeks but a constant chest pain that started over the past 24 hours.  Last night, the patient states that he had difficulties with sleeping due to the chest pain.  He was nauseated, diaphoretic, short of breath.  He presented this morning with chest pain.  No palliating or provoking factors.  The pain does radiate into his left upper extremity.  He currently denies radiation, shortness of breath, nausea, pleurisy, leg swelling.  He continues to have some mild chest pain but declines medication for it.  Emergency Department Course: High sensitivity troponins negative x2.  Chest x-ray negative, CT with contrast negative.  EDP discussed patient with cardiology, who recommended observation, with cardiac work-up.  Review of Systems:   Pt denies any fevers, chills, nausea, vomiting, diarrhea, constipation, abdominal pain, shortness of breath, dyspnea on exertion, orthopnea, cough, wheezing, palpitations, headache, vision changes, lightheadedness, dizziness, melena, rectal bleeding.  Review of systems are otherwise negative  Past Medical History:  Diagnosis Date   Anxiety    COPD (chronic obstructive pulmonary disease) ([place])    Depression    Essential hypertension    GERD (gastroesophageal reflux disease)    H/O hiatal hernia    History of cardiac catheterization    Normal coronaries May  2017   History of ITP    Childhood   Osteoarthritis    Type 2 diabetes mellitus ([place])    Past Surgical History:  Procedure Laterality Date   ANTERIOR CERVICAL DECOMP/DISCECTOMY FUSION  01/21/2012   Procedure: ANTERIOR CERVICAL DECOMPRESSION/DISCECTOMY FUSION 2 LEVELS;  Surgeon: Erline Levine, MD;  Location: [facility];  Service: Neurosurgery;  Laterality: N/A;  Cervical Six-Seven, Cervical Seven-Thoracic One, Anterior Cervical Decompression with Fusion Interbody Prothesis Plating and Bonegraft possible posterior Cervical Seven-Thoracic One Foraminotomy   CARDIAC CATHETERIZATION  [phone]   normal coronary arteries   CARDIAC CATHETERIZATION N/A 01/22/2016   Procedure: Left Heart Cath and Coronary Angiography;  Surgeon: Burnell Blanks, MD;  Location: [facility];  Service: Cardiovascular;  Laterality: N/A;   CARPAL TUNNEL RELEASE Bilateral    CERVICAL DISC SURGERY  04   CHOLECYSTECTOMY N/A 10/26/2014   Procedure: LAPAROSCOPIC CHOLECYSTECTOMY;  Surgeon: Jamesetta So, MD;  Location: [facility];  Service: General;  Laterality: N/A;   ESOPHAGOGASTRODUODENOSCOPY N/A 05/03/2016   Procedure: ESOPHAGOGASTRODUODENOSCOPY (EGD);  Surgeon: Rogene Houston, MD;  Location: [facility];  Service: Endoscopy;  Laterality: N/A;  10:30   HERNIA REPAIR Right 60's   Left elbow bursa removed     TONSILLECTOMY  76   Social History:  reports that he has been smoking cigarettes. He started smoking about 47 years ago. He has a 30.00 pack-year smoking history. He has never used smokeless tobacco. He reports that he does not drink alcohol and does not use drugs. Patient lives at home  No Known Allergies  Family History  Problem Relation Age of  Onset   Heart disease Mother    COPD Mother    Heart attack Father        Sudden death   Heart attack Sister 19   Kidney disease Sister    Heart disease Sister    Heart attack Brother    Heart disease Brother    Diabetes Brother    Heart attack  Brother    Heart disease Brother 104       CABG   Heart attack Brother 69      Prior to Admission medications   Medication Sig Start Date End Date Taking? Authorizing Provider  acetaminophen (TYLENOL) 500 MG tablet Take 500 mg by mouth every 6 (six) hours as needed for mild pain.   Yes [provider]  ALPRAZolam Duanne Moron) 1 MG tablet Take 1 mg by mouth 3 (three) times daily as needed for anxiety.   Yes [provider]  atorvastatin (LIPITOR) 10 MG tablet Take 10 mg by mouth daily. 01/23/21  Yes [provider]  Cholecalciferol (DIALYVITE VITAMIN D 5000 PO) Take 1 capsule by mouth daily.   Yes [provider]  folic acid (FOLVITE) 1 MG tablet TAKE 1 TABLET BY MOUTH TWICE DAILY Patient taking differently: Take 1 mg by mouth 2 (two) times daily. 12/14/18  Yes Deveshwar, Abel Presto, MD  levothyroxine (SYNTHROID, LEVOTHROID) 50 MCG tablet Take 50 mcg by mouth daily. 02/23/18  Yes [provider]  metFORMIN (GLUCOPHAGE) 500 MG tablet Take 500 mg by mouth 2 (two) times daily with a meal.   Yes [provider]  methotrexate (RHEUMATREX) 2.5 MG tablet TAKE EIGHT TABLETS BY MOUTH ONCE WEEKLY Patient taking differently: Take 20 mg by mouth once a week. TAKE EIGHT TABLETS BY MOUTH ONCE WEEKLY. On Saturdays 09/04/18  Yes Ofilia Neas, PA-C  pantoprazole (PROTONIX) 40 MG tablet Take 40 mg by mouth daily.   Yes [provider]  sildenafil (VIAGRA) 25 MG tablet Take 25 mg by mouth daily as needed for erectile dysfunction. 01/12/21  Yes [provider]  zolpidem (AMBIEN) 10 MG tablet Take 10 mg by mouth at bedtime as needed for sleep. Reported on 01/20/2016   Yes [provider]  topiramate (TOPAMAX) 25 MG tablet Take 50 mg by mouth daily. Patient not taking: Reported on 03/04/2021 12/22/20   [provider]    Physical Exam: BP 119/71   Pulse (!) 56   Temp 97.9 F (36.6 C) (Oral)   Resp 15   Ht 6\' 1"  (1.854 m)   Wt 122.5 kg    SpO2 95%   BMI 35.62 kg/m   General: Older male. Awake and alert and oriented x3. No acute cardiopulmonary distress.  HEENT: Normocephalic atraumatic.  Right and left ears normal in appearance.  Pupils equal, round, reactive to light. Extraocular muscles are intact. Sclerae anicteric and noninjected.  Moist mucosal membranes. No mucosal lesions.  Neck: Neck supple without lymphadenopathy. No carotid bruits. No masses palpated.  Cardiovascular: Regular rate with normal S1-S2 sounds. No murmurs, rubs, gallops auscultated. No JVD.  Respiratory: Good respiratory effort with no wheezes, rales, rhonchi. Lungs clear to auscultation bilaterally.  No accessory muscle use. Abdomen: Soft, nontender, nondistended. Active bowel sounds. No masses or hepatosplenomegaly  Skin: No rashes, lesions, or ulcerations.  Dry, warm to touch. 2+ dorsalis pedis and radial pulses. Musculoskeletal: No calf or leg pain. All major joints not erythematous nontender.  No upper or lower joint deformation.  Good ROM.  No contractures  Psychiatric: Intact  judgment and insight. Pleasant and cooperative. Neurologic: No focal neurological deficits. Strength is 5/5 and symmetric in upper and lower extremities.  Cranial nerves II through XII are grossly intact.           Labs on Admission: I have personally reviewed following labs and imaging studies  CBC: Recent Labs  Lab 03/04/21 0935  WBC 7.4  HGB 14.5  HCT 44.7  MCV 104.4*  PLT 902   Basic Metabolic Panel: Recent Labs  Lab 03/04/21 0935  NA 141  K 4.4  CL 102  CO2 33*  GLUCOSE 143*  BUN 27*  CREATININE 0.82  CALCIUM 9.2   GFR: Estimated Creatinine Clearance: 129.7 mL/min (by C-G formula based on SCr of 0.82 mg/dL). Liver Function Tests: No results for input(s): AST, ALT, ALKPHOS, BILITOT, PROT, ALBUMIN in the last 168 hours. No results for input(s): LIPASE, AMYLASE in the last 168 hours. No results for input(s): AMMONIA in the last 168  hours. Coagulation Profile: No results for input(s): INR, PROTIME in the last 168 hours. Cardiac Enzymes: No results for input(s): CKTOTAL, CKMB, CKMBINDEX, TROPONINI in the last 168 hours. BNP (last 3 results) No results for input(s): PROBNP in the last 8760 hours. HbA1C: No results for input(s): HGBA1C in the last 72 hours. CBG: No results for input(s): GLUCAP in the last 168 hours. Lipid Profile: No results for input(s): CHOL, HDL, LDLCALC, TRIG, CHOLHDL, LDLDIRECT in the last 72 hours. Thyroid Function Tests: No results for input(s): TSH, T4TOTAL, FREET4, T3FREE, THYROIDAB in the last 72 hours. Anemia Panel: No results for input(s): VITAMINB12, FOLATE, FERRITIN, TIBC, IRON, RETICCTPCT in the last 72 hours. Urine analysis: No results found for: COLORURINE, APPEARANCEUR, LABSPEC, PHURINE, GLUCOSEU, HGBUR, BILIRUBINUR, KETONESUR, PROTEINUR, UROBILINOGEN, NITRITE, LEUKOCYTESUR Sepsis Labs: @LABRCNTIP (procalcitonin:4,lacticidven:4) ) Recent Results (from the past 240 hour(s))  Resp Panel by RT-PCR (Flu A&B, Covid) Nasopharyngeal Swab     Status: None   Collection Time: 03/04/21 10:49 AM   Specimen: Nasopharyngeal Swab; Nasopharyngeal(NP) swabs in vial transport medium  Result Value Ref Range Status   SARS Coronavirus 2 by RT PCR NEGATIVE NEGATIVE Final    Comment: (NOTE) SARS-CoV-2 target nucleic acids are NOT DETECTED.  The SARS-CoV-2 RNA is generally detectable in upper respiratory specimens during the acute phase of infection. The lowest concentration of SARS-CoV-2 viral copies this assay can detect is 138 copies/mL. A negative result does not preclude SARS-Cov-2 infection and should not be used as the sole basis for treatment or other patient management decisions. A negative result may occur with  improper specimen collection/handling, submission of specimen other than nasopharyngeal swab, presence of viral mutation(s) within the areas targeted by this assay, and inadequate  number of viral copies(<138 copies/mL). A negative result must be combined with clinical observations, patient history, and epidemiological information. The expected result is Negative.  Fact Sheet for Patients:  EntrepreneurPulse.com.au  Fact Sheet for Healthcare Providers:  IncredibleEmployment.be  This test is no t yet approved or cleared by the [place] FDA and  has been authorized for detection and/or diagnosis of SARS-CoV-2 by FDA under an Emergency Use Authorization (EUA). This EUA will remain  in effect (meaning this test can be used) for the duration of the COVID-19 declaration under Section 564(b)(1) of the Act, 21 U.S.C.section 360bbb-3(b)(1), unless the authorization is terminated  or revoked sooner.       Influenza A by PCR NEGATIVE NEGATIVE Final   Influenza B by PCR NEGATIVE NEGATIVE Final    Comment: (NOTE) The Xpert  Xpress SARS-CoV-2/FLU/RSV plus assay is intended as an aid in the diagnosis of influenza from Nasopharyngeal swab specimens and should not be used as a sole basis for treatment. Nasal washings and aspirates are unacceptable for Xpert Xpress SARS-CoV-2/FLU/RSV testing.  Fact Sheet for Patients: EntrepreneurPulse.com.au  Fact Sheet for Healthcare Providers: IncredibleEmployment.be  This test is not yet approved or cleared by the [place] FDA and has been authorized for detection and/or diagnosis of SARS-CoV-2 by FDA under an Emergency Use Authorization (EUA). This EUA will remain in effect (meaning this test can be used) for the duration of the COVID-19 declaration under Section 564(b)(1) of the Act, 21 U.S.C. section 360bbb-3(b)(1), unless the authorization is terminated or revoked.  Performed at [facility], [address] [place], [place] [address]      Radiological Exams on Admission: DG Chest 2 View  Result Date: 03/04/2021 CLINICAL DATA:  Chest pain. Mid  chest pain radiating to left arm, diaphoresis, nausea for 1-2 weeks. EXAM: CHEST - 2 VIEW COMPARISON:  Prior chest radiographs 01/26/2018 and earlier. FINDINGS: Heart size within normal limits. Apparent nodule within the right lung base measuring at least 6 mm (nipple shadow considered less likely). No appreciable airspace consolidation. No evidence of pleural effusion or pneumothorax. No acute bony abnormality identified. Degenerative changes of the spine. Partially visualized ACDF hardware. Surgical clips within the upper abdomen. IMPRESSION: Apparent nodule within the right lung base measuring at least 6 mm (nipple shadow considered less likely). A chest CT is recommended for further evaluation (this may be nonemergent). Elsewhere, there is no appreciable airspace consolidation. Heart size within normal limits. Electronically Signed   By: Kellie Simmering DO   On: 03/04/2021 10:22   CT Chest W Contrast  Result Date: 03/04/2021 CLINICAL DATA:  Normal x-ray. EXAM: CT CHEST WITH CONTRAST TECHNIQUE: Multidetector CT imaging of the chest was performed during intravenous contrast administration. CONTRAST:  8mL OMNIPAQUE IOHEXOL 300 MG/ML  SOLN COMPARISON:  November 04, 2011 FINDINGS: Cardiovascular: No significant vascular findings. Normal heart size. No pericardial effusion. Mediastinum/Nodes: No enlarged mediastinal, hilar, or axillary lymph nodes. Thyroid gland, trachea, and esophagus demonstrate no significant findings. Lungs/Pleura: Upper lobe predominant emphysema. No pulmonary nodules seen. Upper Abdomen: No acute abnormality. Musculoskeletal: No chest wall abnormality. No acute or significant osseous findings. Prior cervical and thoracic spine fusion. IMPRESSION: 1. Upper lobe predominant emphysema, moderate to severe. 2. No evidence of pulmonary nodules. Emphysema (ICD10-J43.9). Electronically Signed   By: Fidela Salisbury M.D.   On: 03/04/2021 13:21    EKG: Independently reviewed.  Normal sinus rhythm.   No acute ST changes.  Assessment/Plan: Principal Problem:   Chest pain Active Problems:   Tobacco use   Rheumatoid arthritis (HCC)   Family history of heart disease   GERD (gastroesophageal reflux disease)   Type 2 diabetes mellitus ([place])    This patient was discussed with the ED physician, including pertinent vitals, physical exam findings, labs, and imaging.  We also discussed care given by the ED provider.  Chest pain  Observation Telemetry Troponin tonight and tomorrow morning Lipid panel in the morning Consult cardiology Echo Cardiology recommended stress test  Type 2 diabetes CT with contrast Hold metformin Will give Lantus and sliding scale insulin. Tobacco abuse 18 patch ordered GERD Continue Protonix Family history of heart disease Rheumatoid arthritis  DVT prophylaxis: Lovenox Consultants: Cardiology Code Status: Full code Family Communication: Wife present Disposition Plan: Patient should be able to discharge to home following work-up   Truett Mainland,  DO

## 2021-03-04 NOTE — ED Notes (Signed)
ED TO INPATIENT HANDOFF REPORT  ED Nurse Name and Phone #:   S Name/Age/Gender Dustin [name] 60 y.o. male Room/Bed: APFT21/APFT21  Code Status   Code Status: Prior  Home/SNF/Other Home Patient oriented to: self, place, time and situation Is this baseline? Yes   Triage Complete: Triage complete  Chief Complaint Chest pain [R07.9]  Triage Note Pt c/o mid chest pain radiating down left arm, diaphoresis, nausea x 1-2 weeks. He reports this pain has kept him up all night. Pt also c/o headache x 6 months. Pt reports being under a lot of stress lately.     Allergies No Known Allergies  Level of Care/Admitting Diagnosis ED Disposition    ED Disposition  Admit   Condition  --   [place]: [facility] [[id number]]  Level of Care: Telemetry [5]  Covid Evaluation: Asymptomatic Screening Protocol (No Symptoms)  Diagnosis: Chest pain [242683]  Admitting Physician: Truett Mainland [[id number]]  Attending Physician: Truett Mainland [[id number]]         B Medical/Surgery History Past Medical History:  Diagnosis Date  . Anxiety   . COPD (chronic obstructive pulmonary disease) ([place])   . Depression   . Essential hypertension   . GERD (gastroesophageal reflux disease)   . H/O hiatal hernia   . History of cardiac catheterization    Normal coronaries May 2017  . History of ITP    Childhood  . Osteoarthritis   . Type 2 diabetes mellitus ([place])    Past Surgical History:  Procedure Laterality Date  . ANTERIOR CERVICAL DECOMP/DISCECTOMY FUSION  01/21/2012   Procedure: ANTERIOR CERVICAL DECOMPRESSION/DISCECTOMY FUSION 2 LEVELS;  Surgeon: Erline Levine, MD;  Location: [place] NEURO ORS;  Service: Neurosurgery;  Laterality: N/A;  Cervical Six-Seven, Cervical Seven-Thoracic One, Anterior Cervical Decompression with Fusion Interbody Prothesis Plating and Bonegraft possible posterior Cervical Seven-Thoracic One Foraminotomy  . CARDIAC CATHETERIZATION  [phone]   normal  coronary arteries  . CARDIAC CATHETERIZATION N/A 01/22/2016   Procedure: Left Heart Cath and Coronary Angiography;  Surgeon: Burnell Blanks, MD;  Location: [place] CV LAB;  Service: Cardiovascular;  Laterality: N/A;  . CARPAL TUNNEL RELEASE Bilateral   . CERVICAL DISC SURGERY  04  . CHOLECYSTECTOMY N/A 10/26/2014   Procedure: LAPAROSCOPIC CHOLECYSTECTOMY;  Surgeon: Jamesetta So, MD;  Location: [facility];  Service: General;  Laterality: N/A;  . ESOPHAGOGASTRODUODENOSCOPY N/A 05/03/2016   Procedure: ESOPHAGOGASTRODUODENOSCOPY (EGD);  Surgeon: Rogene Houston, MD;  Location[facility];  Service: Endoscopy;  Laterality: N/A;  10:30  . HERNIA REPAIR Right 60's  . Left elbow bursa removed    . TONSILLECTOMY  74     A IV Location/Drains/Wounds Patient Lines/Drains/Airways Status    Active Line/Drains/Airways    Name Placement date Placement time Site Days   Peripheral IV 03/04/21 20 G 1" Right Forearm 03/04/21  1214  Forearm  less than 1   Incision (Closed) 10/26/14 Abdomen Other (Comment) 10/26/14  1035  -- 2321   Incision - 4 Ports Abdomen Umbilicus Right Right;Upper Right;Superior 10/26/14  1012  -- 2321          Intake/Output Last 24 hours No intake or output data in the 24 hours ending 03/04/21 2043  Labs/Imaging Results for orders placed or performed during the hospital encounter of 03/04/21 (from the past 48 hour(s))  D-dimer, quantitative     Status: None   Collection Time: 03/04/21  9:32 AM  Result Value Ref Range   D-Dimer, America Brown  0.40 0.00 - 0.50 ug/mL-FEU    Comment: (NOTE) At the manufacturer cut-off value of 0.5 g/mL FEU, this assay has a negative predictive value of 95-100%.This assay is intended for use in conjunction with a clinical pretest probability (PTP) assessment model to exclude pulmonary embolism (PE) and deep venous thrombosis (DVT) in outpatients suspected of PE or DVT. Results should be correlated with clinical presentation. Performed at  [facility], [address], [place], [place] [address]   Basic metabolic panel     Status: Abnormal   Collection Time: 03/04/21  9:35 AM  Result Value Ref Range   Sodium 141 135 - 145 mmol/L   Potassium 4.4 3.5 - 5.1 mmol/L   Chloride 102 98 - 111 mmol/L   CO2 33 (H) 22 - 32 mmol/L   Glucose, Bld 143 (H) 70 - 99 mg/dL    Comment: Glucose reference range applies only to samples taken after fasting for at least 8 hours.   BUN 27 (H) 8 - 23 mg/dL   Creatinine, Ser 0.82 0.61 - 1.24 mg/dL   Calcium 9.2 8.9 - 10.3 mg/dL   GFR, Estimated >60 >60 mL/min    Comment: (NOTE) Calculated using the CKD-EPI Creatinine Equation (2021)    Anion gap 6 5 - 15    Comment: Performed at [facility], [address], [place], [place] [address]  CBC     Status: Abnormal   Collection Time: 03/04/21  9:35 AM  Result Value Ref Range   WBC 7.4 4.0 - 10.5 K/uL   RBC 4.28 4.22 - 5.81 MIL/uL   Hemoglobin 14.5 13.0 - 17.0 g/dL   HCT 44.7 39.0 - 52.0 %   MCV 104.4 (H) 80.0 - 100.0 fL   MCH 33.9 26.0 - 34.0 pg   MCHC 32.4 30.0 - 36.0 g/dL   RDW 13.4 11.5 - 15.5 %   Platelets 242 150 - 400 K/uL   nRBC 0.0 0.0 - 0.2 %    Comment: Performed at [facility], [address], [place], [place] [address]  Troponin I (High Sensitivity)     Status: None   Collection Time: 03/04/21  9:35 AM  Result Value Ref Range   Troponin I (High Sensitivity) 5 <18 ng/L    Comment: (NOTE) Elevated high sensitivity troponin I (hsTnI) values and significant  changes across serial measurements may suggest ACS but many other  chronic and acute conditions are known to elevate hsTnI results.  Refer to the "Links" section for chest pain algorithms and additional  guidance. Performed at [facility], [address], [place], [place] [address]   Resp Panel by RT-PCR (Flu A&B, Covid) Nasopharyngeal Swab     Status: None   Collection Time: 03/04/21 10:49 AM   Specimen: Nasopharyngeal Swab; Nasopharyngeal(NP) swabs in vial transport medium   Result Value Ref Range   SARS Coronavirus 2 by RT PCR NEGATIVE NEGATIVE    Comment: (NOTE) SARS-CoV-2 target nucleic acids are NOT DETECTED.  The SARS-CoV-2 RNA is generally detectable in upper respiratory specimens during the acute phase of infection. The lowest concentration of SARS-CoV-2 viral copies this assay can detect is 138 copies/mL. A negative result does not preclude SARS-Cov-2 infection and should not be used as the sole basis for treatment or other patient management decisions. A negative result may occur with  improper specimen collection/handling, submission of specimen other than nasopharyngeal swab, presence of viral mutation(s) within the areas targeted by this assay, and inadequate number of viral copies(<138 copies/mL). A negative  result must be combined with clinical observations, patient history, and epidemiological information. The expected result is Negative.  Fact Sheet for Patients:  EntrepreneurPulse.com.au  Fact Sheet for Healthcare Providers:  IncredibleEmployment.be  This test is no t yet approved or cleared by the [place] FDA and  has been authorized for detection and/or diagnosis of SARS-CoV-2 by FDA under an Emergency Use Authorization (EUA). This EUA will remain  in effect (meaning this test can be used) for the duration of the COVID-19 declaration under Section 564(b)(1) of the Act, 21 U.S.C.section 360bbb-3(b)(1), unless the authorization is terminated  or revoked sooner.       Influenza A by PCR NEGATIVE NEGATIVE   Influenza B by PCR NEGATIVE NEGATIVE    Comment: (NOTE) The Xpert Xpress SARS-CoV-2/FLU/RSV plus assay is intended as an aid in the diagnosis of influenza from Nasopharyngeal swab specimens and should not be used as a sole basis for treatment. Nasal washings and aspirates are unacceptable for Xpert Xpress SARS-CoV-2/FLU/RSV testing.  Fact Sheet for  Patients: EntrepreneurPulse.com.au  Fact Sheet for Healthcare Providers: IncredibleEmployment.be  This test is not yet approved or cleared by the [place] FDA and has been authorized for detection and/or diagnosis of SARS-CoV-2 by FDA under an Emergency Use Authorization (EUA). This EUA will remain in effect (meaning this test can be used) for the duration of the COVID-19 declaration under Section 564(b)(1) of the Act, 21 U.S.C. section 360bbb-3(b)(1), unless the authorization is terminated or revoked.  Performed at [facility], [address] [place], [place] [address]   Troponin I (High Sensitivity)     Status: None   Collection Time: 03/04/21 11:32 AM  Result Value Ref Range   Troponin I (High Sensitivity) 4 <18 ng/L    Comment: (NOTE) Elevated high sensitivity troponin I (hsTnI) values and significant  changes across serial measurements may suggest ACS but many other  chronic and acute conditions are known to elevate hsTnI results.  Refer to the "Links" section for chest pain algorithms and additional  guidance. Performed at [facility], [address], [place], [place] [address]    DG Chest 2 View  Result Date: 03/04/2021 CLINICAL DATA:  Chest pain. Mid chest pain radiating to left arm, diaphoresis, nausea for 1-2 weeks. EXAM: CHEST - 2 VIEW COMPARISON:  Prior chest radiographs 01/26/2018 and earlier. FINDINGS: Heart size within normal limits. Apparent nodule within the right lung base measuring at least 6 mm (nipple shadow considered less likely). No appreciable airspace consolidation. No evidence of pleural effusion or pneumothorax. No acute bony abnormality identified. Degenerative changes of the spine. Partially visualized ACDF hardware. Surgical clips within the upper abdomen. IMPRESSION: Apparent nodule within the right lung base measuring at least 6 mm (nipple shadow considered less likely). A chest CT is recommended for further  evaluation (this may be nonemergent). Elsewhere, there is no appreciable airspace consolidation. Heart size within normal limits. Electronically Signed   By: Kellie Simmering DO   On: 03/04/2021 10:22   CT Chest W Contrast  Result Date: 03/04/2021 CLINICAL DATA:  Normal x-ray. EXAM: CT CHEST WITH CONTRAST TECHNIQUE: Multidetector CT imaging of the chest was performed during intravenous contrast administration. CONTRAST:  61mL OMNIPAQUE IOHEXOL 300 MG/ML  SOLN COMPARISON:  November 04, 2011 FINDINGS: Cardiovascular: No significant vascular findings. Normal heart size. No pericardial effusion. Mediastinum/Nodes: No enlarged mediastinal, hilar, or axillary lymph nodes. Thyroid gland, trachea, and esophagus demonstrate no significant findings. Lungs/Pleura: Upper lobe predominant emphysema. No pulmonary nodules seen. Upper Abdomen: No  acute abnormality. Musculoskeletal: No chest wall abnormality. No acute or significant osseous findings. Prior cervical and thoracic spine fusion. IMPRESSION: 1. Upper lobe predominant emphysema, moderate to severe. 2. No evidence of pulmonary nodules. Emphysema (ICD10-J43.9). Electronically Signed   By: Fidela Salisbury M.D.   On: 03/04/2021 13:21    Pending Labs [employer] (From admission, onward)    Start     Ordered   Signed and Held  Hemoglobin A1c  Once,   R       Comments: To assess prior glycemic control    Signed and Held   Signed and Held  HIV Antibody (routine testing w rflx)  (HIV Antibody (Routine testing w reflex) panel)  Tomorrow morning,   R        Signed and Held          Vitals/Pain Today's Vitals   03/04/21 1445 03/04/21 1500 03/04/21 1530 03/04/21 1947  BP: 116/75 119/71 (!) 127/104 137/74  Pulse: 63 (!) 56 82 72  Resp: 20 15 (!) 21 16  Temp:      TempSrc:      SpO2: 96% 95% 93% 95%  Weight:      Height:      PainSc:        Isolation Precautions Airborne and Contact precautions  Medications Medications  iohexol (OMNIPAQUE) 300  MG/ML solution 75 mL (75 mLs Intravenous Contrast Given 03/04/21 1242)    Mobility walks Low fall risk      R Recommendations: See Admitting Provider Note  Report given to:   Additional Notes:

## 2021-03-05 ENCOUNTER — Observation Stay (HOSPITAL_COMMUNITY): Payer: BC Managed Care – PPO

## 2021-03-05 ENCOUNTER — Observation Stay (HOSPITAL_BASED_OUTPATIENT_CLINIC_OR_DEPARTMENT_OTHER): Payer: BC Managed Care – PPO

## 2021-03-05 DIAGNOSIS — R079 Chest pain, unspecified: Secondary | ICD-10-CM | POA: Diagnosis not present

## 2021-03-05 DIAGNOSIS — E1169 Type 2 diabetes mellitus with other specified complication: Secondary | ICD-10-CM

## 2021-03-05 DIAGNOSIS — F17210 Nicotine dependence, cigarettes, uncomplicated: Secondary | ICD-10-CM | POA: Diagnosis not present

## 2021-03-05 DIAGNOSIS — J439 Emphysema, unspecified: Secondary | ICD-10-CM

## 2021-03-05 DIAGNOSIS — E785 Hyperlipidemia, unspecified: Secondary | ICD-10-CM | POA: Diagnosis not present

## 2021-03-05 LAB — NM MYOCAR MULTI W/SPECT W/WALL MOTION / EF
LV dias vol: 102 mL (ref 62–150)
LV sys vol: 44 mL
Peak HR: 101 {beats}/min
RATE: 0.63
Rest HR: 72 {beats}/min
SDS: 2
SRS: 2
SSS: 4
TID: 0.97

## 2021-03-05 LAB — GLUCOSE, CAPILLARY
Glucose-Capillary: 136 mg/dL — ABNORMAL HIGH (ref 70–99)
Glucose-Capillary: 169 mg/dL — ABNORMAL HIGH (ref 70–99)

## 2021-03-05 LAB — ECHOCARDIOGRAM COMPLETE
Area-P 1/2: 3.08 cm2
S' Lateral: 3.5 cm

## 2021-03-05 LAB — HIV ANTIBODY (ROUTINE TESTING W REFLEX): HIV Screen 4th Generation wRfx: NONREACTIVE

## 2021-03-05 MED ORDER — SODIUM CHLORIDE FLUSH 0.9 % IV SOLN
INTRAVENOUS | Status: AC
  Administered 2021-03-05: 10 mL via INTRAVENOUS
  Filled 2021-03-05: qty 10

## 2021-03-05 MED ORDER — REGADENOSON 0.4 MG/5ML IV SOLN
0.4000 mg | Freq: Once | INTRAVENOUS | Status: AC
  Administered 2021-03-05: 0.4 mg via INTRAVENOUS
  Filled 2021-03-05: qty 5

## 2021-03-05 MED ORDER — REGADENOSON 0.4 MG/5ML IV SOLN
INTRAVENOUS | Status: AC
  Filled 2021-03-05: qty 5

## 2021-03-05 MED ORDER — TECHNETIUM TC 99M TETROFOSMIN IV KIT
30.0000 | PACK | Freq: Once | INTRAVENOUS | Status: AC | PRN
  Administered 2021-03-05: 33 via INTRAVENOUS

## 2021-03-05 MED ORDER — TECHNETIUM TC 99M TETROFOSMIN IV KIT
10.0000 | PACK | Freq: Once | INTRAVENOUS | Status: AC | PRN
  Administered 2021-03-05: 10 via INTRAVENOUS
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
## 2021-03-05 NOTE — Plan of Care (Signed)

## 2021-03-05 NOTE — ED Notes (Signed)
Pt here for chest pain, Pt will not wear cardiac monitor.  Pt did agree to wear BP cuff O2

## 2021-03-05 NOTE — Discharge Summary (Signed)
Physician Discharge Summary  EMAN MORIMOTO PZW:[id number] DOB: 26-Oct-1960 DOA: 03/04/2021  PCP: Redmond School, MD  Admit date: 03/04/2021  Discharge date: 03/05/2021  Admitted From:Home  Disposition:  Home  Recommendations for Outpatient Follow-up:  Follow up with PCP in 1-2 weeks Follow-up with Dr. Domenic Polite with cardiology as previously scheduled Continue home medications as prior Encouraged smoking cessation  Home Health: None  Equipment/Devices: None  Discharge Condition:Stable  CODE STATUS: Full  Diet recommendation: Heart Healthy/carb modified  Brief/Interim Summary: Dustin [name] is a 60 y.o. male with a history of type 2 diabetes, hypertension, hyperlipidemia, tobacco abuse, family history of early MI including his father who died at 10, a brother who died at 24 from heart disease, and 2 sisters died before the age of 35 due to heart disease.  He presented with recurrent chest pain that has progressively become more constant over the last several weeks and intensified in the last 24 hours.  He states that he had been under a great deal of stress with his family and this usually bothers him with chest pain.  He had been seen by cardiology with recommendations for nuclear stress test and was noted to have a normal stress test with EF of 57%.  He is currently in stable condition for discharge and will continue his usual home medications.  He has had no further chest pain or other symptomatology during the course of the stay.  Discharge Diagnoses:  Principal Problem:   Chest pain Active Problems:   Tobacco use   Rheumatoid arthritis (HCC)   Family history of heart disease   GERD (gastroesophageal reflux disease)   Type 2 diabetes mellitus ([place])  Principal discharge diagnosis: Atypical chest pain.  Discharge Instructions  Discharge Instructions     Diet - low sodium heart healthy   Complete by: As directed    Increase activity slowly   Complete by: As directed        Allergies as of 03/05/2021   No Known Allergies      Medication List     TAKE these medications    acetaminophen 500 MG tablet Commonly known as: TYLENOL Take 500 mg by mouth every 6 (six) hours as needed for mild pain.   ALPRAZolam 1 MG tablet Commonly known as: XANAX Take 1 mg by mouth 3 (three) times daily as needed for anxiety.   atorvastatin 10 MG tablet Commonly known as: LIPITOR Take 10 mg by mouth daily.   DIALYVITE VITAMIN D 5000 PO Take 1 capsule by mouth daily.   folic acid 1 MG tablet Commonly known as: FOLVITE TAKE 1 TABLET BY MOUTH TWICE DAILY   levothyroxine 50 MCG tablet Commonly known as: SYNTHROID Take 50 mcg by mouth daily.   metFORMIN 500 MG tablet Commonly known as: GLUCOPHAGE Take 500 mg by mouth 2 (two) times daily with a meal.   methotrexate 2.5 MG tablet Commonly known as: RHEUMATREX TAKE EIGHT TABLETS BY MOUTH ONCE WEEKLY What changed:  how much to take how to take this when to take this additional instructions   pantoprazole 40 MG tablet Commonly known as: PROTONIX Take 40 mg by mouth daily.   sildenafil 25 MG tablet Commonly known as: VIAGRA Take 25 mg by mouth daily as needed for erectile dysfunction.   topiramate 25 MG tablet Commonly known as: TOPAMAX Take 50 mg by mouth daily.   zolpidem 10 MG tablet Commonly known as: AMBIEN Take 10 mg by mouth at bedtime as needed for sleep. Reported  on 01/20/2016        Follow-up Information     Redmond School, MD. Schedule an appointment as soon as possible for a visit in 1 week(s).   Specialty: Internal Medicine Contact information: [address] [place] [address] [phone]         Satira Sark, MD. Go to.   Specialty: Cardiology Why: As needed, If symptoms worsen Contact information: [place] [address] [phone]                No Known Allergies  Consultations: Cardiology   Procedures/Studies: DG  Chest 2 View  Result Date: 03/04/2021 CLINICAL DATA:  Chest pain. Mid chest pain radiating to left arm, diaphoresis, nausea for 1-2 weeks. EXAM: CHEST - 2 VIEW COMPARISON:  Prior chest radiographs 01/26/2018 and earlier. FINDINGS: Heart size within normal limits. Apparent nodule within the right lung base measuring at least 6 mm (nipple shadow considered less likely). No appreciable airspace consolidation. No evidence of pleural effusion or pneumothorax. No acute bony abnormality identified. Degenerative changes of the spine. Partially visualized ACDF hardware. Surgical clips within the upper abdomen. IMPRESSION: Apparent nodule within the right lung base measuring at least 6 mm (nipple shadow considered less likely). A chest CT is recommended for further evaluation (this may be nonemergent). Elsewhere, there is no appreciable airspace consolidation. Heart size within normal limits. Electronically Signed   By: Kellie Simmering DO   On: 03/04/2021 10:22   CT Chest W Contrast  Result Date: 03/04/2021 CLINICAL DATA:  Normal x-ray. EXAM: CT CHEST WITH CONTRAST TECHNIQUE: Multidetector CT imaging of the chest was performed during intravenous contrast administration. CONTRAST:  66mL OMNIPAQUE IOHEXOL 300 MG/ML  SOLN COMPARISON:  November 04, 2011 FINDINGS: Cardiovascular: No significant vascular findings. Normal heart size. No pericardial effusion. Mediastinum/Nodes: No enlarged mediastinal, hilar, or axillary lymph nodes. Thyroid gland, trachea, and esophagus demonstrate no significant findings. Lungs/Pleura: Upper lobe predominant emphysema. No pulmonary nodules seen. Upper Abdomen: No acute abnormality. Musculoskeletal: No chest wall abnormality. No acute or significant osseous findings. Prior cervical and thoracic spine fusion. IMPRESSION: 1. Upper lobe predominant emphysema, moderate to severe. 2. No evidence of pulmonary nodules. Emphysema (ICD10-J43.9). Electronically Signed   By: Fidela Salisbury M.D.   On:  03/04/2021 13:21   NM Myocar Multi W/Spect Tamela Oddi Motion / EF  Result Date: 03/05/2021  Lexiscan stress is electrically negative for iscchemia  Myoview scan shows probable normal perfusion and mild soft tissue attenuation (diaphragm) No significant ischemia or scar  LVEF calculated at 57%  Low risk study    ECHOCARDIOGRAM COMPLETE  Result Date: 03/05/2021    ECHOCARDIOGRAM REPORT   Patient Name:   Dustin [name] Date of Exam: 03/05/2021 Medical Rec #:  [id number]      Height:       74.0 in Accession #:    [id number]     Weight:       261.1 lb Date of Birth:  04/22/1961      BSA:          2.434 m Patient Age:    50 years       BP: Patient Gender: M              HR:           82 bpm. Exam Location:  [facility] Procedure: 2D Echo Indications:    Chest pain  Sonographer:    BW Referring Phys: [place]  1.  Left ventricular ejection fraction, by estimation, is 60 to 65%. The left ventricle has normal function. The left ventricle has no regional wall motion abnormalities. There is mild left ventricular hypertrophy. Left ventricular diastolic parameters are indeterminate.  2. Right ventricular systolic function is normal. The right ventricular size is normal.  3. Pericardial fat pad.  4. Mild mitral valve regurgitation.  5. Aortic valve regurgitation is not visualized. Mild aortic valve sclerosis is present, with no evidence of aortic valve stenosis. FINDINGS  Left Ventricle: Left ventricular ejection fraction, by estimation, is 60 to 65%. The left ventricle has normal function. The left ventricle has no regional wall motion abnormalities. The left ventricular internal cavity size was normal in size. There is  mild left ventricular hypertrophy. Left ventricular diastolic parameters are indeterminate. Right Ventricle: The right ventricular size is normal. Right vetricular wall thickness was not assessed. Right ventricular systolic function is normal. Left Atrium: Left atrial size was normal  in size. Right Atrium: Right atrial size was normal in size. Pericardium: Pericardial fat pad. There is no evidence of pericardial effusion. Mitral Valve: There is mild thickening of the mitral valve leaflet(s). Mild mitral annular calcification. Mild mitral valve regurgitation. Tricuspid Valve: The tricuspid valve is normal in structure. Tricuspid valve regurgitation is trivial. Aortic Valve: Aortic valve regurgitation is not visualized. Mild aortic valve sclerosis is present, with no evidence of aortic valve stenosis. Pulmonic Valve: The pulmonic valve was normal in structure. Pulmonic valve regurgitation is not visualized. Aorta: The aortic root is normal in size and structure.  LEFT VENTRICLE PLAX 2D LVIDd:         5.00 cm  Diastology LVIDs:         3.50 cm  LV e' medial:    7.29 cm/s LV PW:         1.00 cm  LV E/e' medial:  20.9 LV IVS:        1.20 cm  LV e' lateral:   9.79 cm/s LVOT diam:     1.90 cm  LV E/e' lateral: 15.5 LV SV:         62 LV SV Index:   25 LVOT Area:     2.84 cm  RIGHT VENTRICLE RV S prime:     15.80 cm/s TAPSE (M-mode): 2.5 cm LEFT ATRIUM             Index       RIGHT ATRIUM           Index LA diam:        4.50 cm 1.85 cm/m  RA Area:     17.90 cm LA Vol (A2C):   75.8 ml 31.14 ml/m RA Volume:   48.60 ml  19.97 ml/m LA Vol (A4C):   62.8 ml 25.80 ml/m LA Biplane Vol: 69.2 ml 28.43 ml/m  AORTIC VALVE LVOT Vmax:   118.00 cm/s LVOT Vmean:  78.800 cm/s LVOT VTI:    0.217 m  AORTA Ao Root diam: 3.30 cm MITRAL VALVE MV Area (PHT): 3.08 cm     SHUNTS MV Decel Time: 246 msec     Systemic VTI:  0.22 m MV E velocity: 152.00 cm/s  Systemic Diam: 1.90 cm MV A velocity: 143.00 cm/s MV E/A ratio:  1.06 Dorris Carnes MD Electronically signed by Dorris Carnes MD Signature Date/Time: 03/05/2021/12:58:12 PM    Final      Discharge Exam: Vitals:   03/05/21 0832 03/05/21 1322  BP: 136/86 132/87  Pulse: 75 83  Resp: 18  20  Temp: 98.3 F (36.8 C) 98 F (36.7 C)  SpO2: 93% 94%   Vitals:   03/05/21  0245 03/05/21 0500 03/05/21 0832 03/05/21 1322  BP: (!) 117/54 125/80 136/86 132/87  Pulse: 64 65 75 83  Resp: 17 16 18 20   Temp:   98.3 F (36.8 C) 98 F (36.7 C)  TempSrc:   Oral Oral  SpO2: 95% 93% 93% 94%  Weight:   118.4 kg   Height:   6\' 2"  (1.88 m)     General: Pt is alert, awake, not in acute distress Cardiovascular: RRR, S1/S2 +, no rubs, no gallops Respiratory: CTA bilaterally, no wheezing, no rhonchi Abdominal: Soft, NT, ND, bowel sounds + Extremities: no edema, no cyanosis    The results of significant diagnostics from this hospitalization (including imaging, microbiology, ancillary and laboratory) are listed below for reference.     Microbiology: Recent Results (from the past 240 hour(s))  Resp Panel by RT-PCR (Flu A&B, Covid) Nasopharyngeal Swab     Status: None   Collection Time: 03/04/21 10:49 AM   Specimen: Nasopharyngeal Swab; Nasopharyngeal(NP) swabs in vial transport medium  Result Value Ref Range Status   SARS Coronavirus 2 by RT PCR NEGATIVE NEGATIVE Final    Comment: (NOTE) SARS-CoV-2 target nucleic acids are NOT DETECTED.  The SARS-CoV-2 RNA is generally detectable in upper respiratory specimens during the acute phase of infection. The lowest concentration of SARS-CoV-2 viral copies this assay can detect is 138 copies/mL. A negative result does not preclude SARS-Cov-2 infection and should not be used as the sole basis for treatment or other patient management decisions. A negative result may occur with  improper specimen collection/handling, submission of specimen other than nasopharyngeal swab, presence of viral mutation(s) within the areas targeted by this assay, and inadequate number of viral copies(<138 copies/mL). A negative result must be combined with clinical observations, patient history, and epidemiological information. The expected result is Negative.  Fact Sheet for Patients:  EntrepreneurPulse.com.au  Fact Sheet  for Healthcare Providers:  IncredibleEmployment.be  This test is no t yet approved or cleared by the [place] FDA and  has been authorized for detection and/or diagnosis of SARS-CoV-2 by FDA under an Emergency Use Authorization (EUA). This EUA will remain  in effect (meaning this test can be used) for the duration of the COVID-19 declaration under Section 564(b)(1) of the Act, 21 U.S.C.section 360bbb-3(b)(1), unless the authorization is terminated  or revoked sooner.       Influenza A by PCR NEGATIVE NEGATIVE Final   Influenza B by PCR NEGATIVE NEGATIVE Final    Comment: (NOTE) The Xpert Xpress SARS-CoV-2/FLU/RSV plus assay is intended as an aid in the diagnosis of influenza from Nasopharyngeal swab specimens and should not be used as a sole basis for treatment. Nasal washings and aspirates are unacceptable for Xpert Xpress SARS-CoV-2/FLU/RSV testing.  Fact Sheet for Patients: EntrepreneurPulse.com.au  Fact Sheet for Healthcare Providers: IncredibleEmployment.be  This test is not yet approved or cleared by the [place] FDA and has been authorized for detection and/or diagnosis of SARS-CoV-2 by FDA under an Emergency Use Authorization (EUA). This EUA will remain in effect (meaning this test can be used) for the duration of the COVID-19 declaration under Section 564(b)(1) of the Act, 21 U.S.C. section 360bbb-3(b)(1), unless the authorization is terminated or revoked.  Performed at [facility], [address] [place], [place] [address]      Labs: BNP (last 3 results) No results for input(s): BNP in the  last 8760 hours. Basic Metabolic Panel: Recent Labs  Lab 03/04/21 0935  NA 141  K 4.4  CL 102  CO2 33*  GLUCOSE 143*  BUN 27*  CREATININE 0.82  CALCIUM 9.2   Liver Function Tests: No results for input(s): AST, ALT, ALKPHOS, BILITOT, PROT, ALBUMIN in the last 168 hours. No results for input(s): LIPASE,  AMYLASE in the last 168 hours. No results for input(s): AMMONIA in the last 168 hours. CBC: Recent Labs  Lab 03/04/21 0935  WBC 7.4  HGB 14.5  HCT 44.7  MCV 104.4*  PLT 242   Cardiac Enzymes: No results for input(s): CKTOTAL, CKMB, CKMBINDEX, TROPONINI in the last 168 hours. BNP: Invalid input(s): POCBNP CBG: Recent Labs  Lab 03/04/21 2217 03/05/21 0850 03/05/21 1237  GLUCAP 162* 136* 169*   D-Dimer Recent Labs    03/04/21 0932  DDIMER 0.40   Hgb A1c No results for input(s): HGBA1C in the last 72 hours. Lipid Profile No results for input(s): CHOL, HDL, LDLCALC, TRIG, CHOLHDL, LDLDIRECT in the last 72 hours. Thyroid function studies No results for input(s): TSH, T4TOTAL, T3FREE, THYROIDAB in the last 72 hours.  Invalid input(s): FREET3 Anemia work up No results for input(s): VITAMINB12, FOLATE, FERRITIN, TIBC, IRON, RETICCTPCT in the last 72 hours. Urinalysis No results found for: COLORURINE, APPEARANCEUR, [place], [place], GLUCOSEU, [place], [place], [place], PROTEINUR, UROBILINOGEN, NITRITE, LEUKOCYTESUR Sepsis Labs Invalid input(s): PROCALCITONIN,  WBC,  LACTICIDVEN Microbiology Recent Results (from the past 240 hour(s))  Resp Panel by RT-PCR (Flu A&B, Covid) Nasopharyngeal Swab     Status: None   Collection Time: 03/04/21 10:49 AM   Specimen: Nasopharyngeal Swab; Nasopharyngeal(NP) swabs in vial transport medium  Result Value Ref Range Status   SARS Coronavirus 2 by RT PCR NEGATIVE NEGATIVE Final    Comment: (NOTE) SARS-CoV-2 target nucleic acids are NOT DETECTED.  The SARS-CoV-2 RNA is generally detectable in upper respiratory specimens during the acute phase of infection. The lowest concentration of SARS-CoV-2 viral copies this assay can detect is 138 copies/mL. A negative result does not preclude SARS-Cov-2 infection and should not be used as the sole basis for treatment or other patient management decisions. A negative result may occur with   improper specimen collection/handling, submission of specimen other than nasopharyngeal swab, presence of viral mutation(s) within the areas targeted by this assay, and inadequate number of viral copies(<138 copies/mL). A negative result must be combined with clinical observations, patient history, and epidemiological information. The expected result is Negative.  Fact Sheet for Patients:  EntrepreneurPulse.com.au  Fact Sheet for Healthcare Providers:  IncredibleEmployment.be  This test is no t yet approved or cleared by the [place] FDA and  has been authorized for detection and/or diagnosis of SARS-CoV-2 by FDA under an Emergency Use Authorization (EUA). This EUA will remain  in effect (meaning this test can be used) for the duration of the COVID-19 declaration under Section 564(b)(1) of the Act, 21 U.S.C.section 360bbb-3(b)(1), unless the authorization is terminated  or revoked sooner.       Influenza A by PCR NEGATIVE NEGATIVE Final   Influenza B by PCR NEGATIVE NEGATIVE Final    Comment: (NOTE) The Xpert Xpress SARS-CoV-2/FLU/RSV plus assay is intended as an aid in the diagnosis of influenza from Nasopharyngeal swab specimens and should not be used as a sole basis for treatment. Nasal washings and aspirates are unacceptable for Xpert Xpress SARS-CoV-2/FLU/RSV testing.  Fact Sheet for Patients: EntrepreneurPulse.com.au  Fact Sheet for Healthcare Providers: IncredibleEmployment.be  This test is not  yet approved or cleared by the [place] and has been authorized for detection and/or diagnosis of SARS-CoV-2 by FDA under an Emergency Use Authorization (EUA). This EUA will remain in effect (meaning this test can be used) for the duration of the COVID-19 declaration under Section 564(b)(1) of the Act, 21 U.S.C. section 360bbb-3(b)(1), unless the authorization is terminated  or revoked.  Performed at [facility], [address] [place], [place] [address]      Time coordinating discharge: 35 minutes  SIGNED:   Rodena Goldmann, DO [facility] 03/05/2021, 4:08 PM  If 7PM-7AM, please contact night-coverage [email or web address]

## 2021-03-06 LAB — HEMOGLOBIN A1C
Hgb A1c MFr Bld: 6.8 % — ABNORMAL HIGH (ref 4.8–5.6)
Mean Plasma Glucose: 148 mg/dL

## 2021-03-16 DIAGNOSIS — R0789 Other chest pain: Secondary | ICD-10-CM | POA: Diagnosis not present

## 2021-03-16 DIAGNOSIS — F410 Panic disorder [episodic paroxysmal anxiety] without agoraphobia: Secondary | ICD-10-CM | POA: Diagnosis not present

## 2021-03-16 DIAGNOSIS — Z6837 Body mass index (BMI) 37.0-37.9, adult: Secondary | ICD-10-CM | POA: Diagnosis not present

## 2021-03-16 DIAGNOSIS — I10 Essential (primary) hypertension: Secondary | ICD-10-CM | POA: Diagnosis not present

## 2021-03-16 DIAGNOSIS — E119 Type 2 diabetes mellitus without complications: Secondary | ICD-10-CM | POA: Diagnosis not present

## 2021-03-16 DIAGNOSIS — E063 Autoimmune thyroiditis: Secondary | ICD-10-CM | POA: Diagnosis not present

## 2021-04-06 DIAGNOSIS — M542 Cervicalgia: Secondary | ICD-10-CM | POA: Diagnosis not present

## 2021-04-06 DIAGNOSIS — R030 Elevated blood-pressure reading, without diagnosis of hypertension: Secondary | ICD-10-CM | POA: Diagnosis not present

## 2021-04-06 DIAGNOSIS — Z6835 Body mass index (BMI) 35.0-35.9, adult: Secondary | ICD-10-CM | POA: Diagnosis not present

## 2021-04-11 DIAGNOSIS — E063 Autoimmune thyroiditis: Secondary | ICD-10-CM | POA: Diagnosis not present

## 2021-04-11 DIAGNOSIS — I10 Essential (primary) hypertension: Secondary | ICD-10-CM | POA: Diagnosis not present

## 2021-04-11 DIAGNOSIS — I889 Nonspecific lymphadenitis, unspecified: Secondary | ICD-10-CM | POA: Diagnosis not present

## 2021-04-11 DIAGNOSIS — E6609 Other obesity due to excess calories: Secondary | ICD-10-CM | POA: Diagnosis not present

## 2021-04-11 DIAGNOSIS — G4489 Other headache syndrome: Secondary | ICD-10-CM | POA: Diagnosis not present

## 2021-04-11 DIAGNOSIS — Z6836 Body mass index (BMI) 36.0-36.9, adult: Secondary | ICD-10-CM | POA: Diagnosis not present

## 2021-04-13 ENCOUNTER — Other Ambulatory Visit: Payer: Self-pay | Admitting: Neurosurgery

## 2021-04-13 DIAGNOSIS — M542 Cervicalgia: Secondary | ICD-10-CM

## 2021-04-27 ENCOUNTER — Ambulatory Visit
Admission: RE | Admit: 2021-04-27 | Discharge: 2021-04-27 | Disposition: A | Discharge status: Home / Self Care | Payer: BC Managed Care – PPO | Source: Ambulatory Visit | Attending: Neurosurgery | Admitting: Neurosurgery

## 2021-04-27 ENCOUNTER — Other Ambulatory Visit: Payer: Self-pay

## 2021-04-27 DIAGNOSIS — M0689 Other specified rheumatoid arthritis, multiple sites: Secondary | ICD-10-CM | POA: Diagnosis not present

## 2021-04-27 DIAGNOSIS — Z6836 Body mass index (BMI) 36.0-36.9, adult: Secondary | ICD-10-CM | POA: Diagnosis not present

## 2021-04-27 DIAGNOSIS — M542 Cervicalgia: Secondary | ICD-10-CM

## 2021-04-27 DIAGNOSIS — E6609 Other obesity due to excess calories: Secondary | ICD-10-CM | POA: Diagnosis not present

## 2021-04-27 DIAGNOSIS — I10 Essential (primary) hypertension: Secondary | ICD-10-CM | POA: Diagnosis not present

## 2021-04-27 DIAGNOSIS — K219 Gastro-esophageal reflux disease without esophagitis: Secondary | ICD-10-CM | POA: Diagnosis not present

## 2021-04-27 DIAGNOSIS — M1991 Primary osteoarthritis, unspecified site: Secondary | ICD-10-CM | POA: Diagnosis not present

## 2021-04-27 DIAGNOSIS — E063 Autoimmune thyroiditis: Secondary | ICD-10-CM | POA: Diagnosis not present

## 2021-04-27 DIAGNOSIS — E119 Type 2 diabetes mellitus without complications: Secondary | ICD-10-CM | POA: Diagnosis not present

## 2021-05-10 DIAGNOSIS — M542 Cervicalgia: Secondary | ICD-10-CM | POA: Diagnosis not present

## 2021-05-10 DIAGNOSIS — R519 Headache, unspecified: Secondary | ICD-10-CM | POA: Diagnosis not present

## 2021-05-10 DIAGNOSIS — M47812 Spondylosis without myelopathy or radiculopathy, cervical region: Secondary | ICD-10-CM | POA: Diagnosis not present

## 2021-05-10 DIAGNOSIS — R590 Localized enlarged lymph nodes: Secondary | ICD-10-CM | POA: Diagnosis not present

## 2021-05-11 DIAGNOSIS — E6609 Other obesity due to excess calories: Secondary | ICD-10-CM | POA: Diagnosis not present

## 2021-05-11 DIAGNOSIS — M1991 Primary osteoarthritis, unspecified site: Secondary | ICD-10-CM | POA: Diagnosis not present

## 2021-05-11 DIAGNOSIS — M5030 Other cervical disc degeneration, unspecified cervical region: Secondary | ICD-10-CM | POA: Diagnosis not present

## 2021-05-11 DIAGNOSIS — I10 Essential (primary) hypertension: Secondary | ICD-10-CM | POA: Diagnosis not present

## 2021-05-11 DIAGNOSIS — Z6836 Body mass index (BMI) 36.0-36.9, adult: Secondary | ICD-10-CM | POA: Diagnosis not present

## 2021-05-11 DIAGNOSIS — R590 Localized enlarged lymph nodes: Secondary | ICD-10-CM | POA: Diagnosis not present

## 2021-05-11 DIAGNOSIS — R599 Enlarged lymph nodes, unspecified: Secondary | ICD-10-CM | POA: Diagnosis not present

## 2021-05-18 ENCOUNTER — Telehealth: Payer: Self-pay | Admitting: Nurse Practitioner

## 2021-05-18 NOTE — Telephone Encounter (Signed)
Called patient no answer.  Called wife's cell phone and LMVM for Dustin [name] regarding appointment for 9/8. Also left our phone number should he need to reschedule or need any other info

## 2021-05-23 ENCOUNTER — Encounter: Payer: Self-pay | Admitting: *Deleted

## 2021-05-23 NOTE — Progress Notes (Signed)
New Hematology/Oncology Consult   Requesting MD: Dr. Redmond School  [phone]  Reason for Consult: Lymphadenopathy  HPI: Dustin [name] is a 60 year old man referred for evaluation of lymphadenopathy.  He had an MRI of the cervical spine 04/27/2021 to evaluate neck pain.  He was noted to have bilateral upper jugular lymphadenopathy.  A right level 2 node measured 18 mm in length.  He reports a 3 to 75-monthhistory of left neck pain.  6 to 8 weeks ago he noticed an enlarged lymph node in the left neck.  The lymph node has not changed.  He completed a course of antibiotics and steroids with no improvement.  No fevers or sweats.  He has a good appetite.  No weight loss.  For the past 6 to 8 months he has noted food intermittently getting "stuck" in the back of his throat.  He reports a cough related to smoking.  No shortness of breath.  No sore throat.  He thinks he may have had COVID in January 2020.  Of note, he had a chest CT 03/04/2021 which showed upper lobe predominant emphysema, moderate to severe.  There were no pulmonary nodules, no enlarged mediastinal, hilar or axillary lymph nodes.     Past Medical History:  Diagnosis Date   Anxiety    COPD (chronic obstructive pulmonary disease) ([facility]    Depression    Essential hypertension    GERD (gastroesophageal reflux disease)    H/O hiatal hernia    History of cardiac catheterization    Normal coronaries May 2017   History of ITP    Childhood   Osteoarthritis    Type 2 diabetes mellitus ([facility]   :   Past Surgical History:  Procedure Laterality Date   ANTERIOR CERVICAL DECOMP/DISCECTOMY FUSION  01/21/2012   Procedure: ANTERIOR CERVICAL DECOMPRESSION/DISCECTOMY FUSION 2 LEVELS;  Surgeon: JErline Levine MD;  [facility];  Service: Neurosurgery;  Laterality: N/A;  Cervical Six-Seven, Cervical Seven-Thoracic One, Anterior Cervical Decompression with Fusion Interbody Prothesis Plating and Bonegraft possible posterior Cervical  Seven-Thoracic One Foraminotomy   CARDIAC CATHETERIZATION  [phone]  normal coronary arteries   CARDIAC CATHETERIZATION N/A 01/22/2016   Procedure: Left Heart Cath and Coronary Angiography;  Surgeon: CBurnell Blanks MD;  [facility];  Service: Cardiovascular;  Laterality: N/A;   CARPAL TUNNEL RELEASE Bilateral    CERVICAL DISC SURGERY  04   CHOLECYSTECTOMY N/A 10/26/2014   Procedure: LAPAROSCOPIC CHOLECYSTECTOMY;  Surgeon: MJamesetta So MD;  Location: [facility];  Service: General;  Laterality: N/A;   ESOPHAGOGASTRODUODENOSCOPY N/A 05/03/2016   Procedure: ESOPHAGOGASTRODUODENOSCOPY (EGD);  Surgeon: NRogene Houston MD;  [facility]: [facility];  Service: Endoscopy;  Laterality: N/A;  10:30   HERNIA REPAIR Right 60's   Left elbow bursa removed     TONSILLECTOMY  72  :   Current Outpatient Medications:    acetaminophen (TYLENOL) 500 MG tablet, Take 500 mg by mouth every 6 (six) hours as needed for mild pain., Disp: , Rfl:    ALPRAZolam (XANAX) 1 MG tablet, Take 1 mg by mouth 3 (three) times daily as needed for anxiety., Disp: , Rfl:    atorvastatin (LIPITOR) 10 MG tablet, Take 10 mg by mouth daily., Disp: , Rfl:    Cholecalciferol (DIALYVITE VITAMIN D 5000 PO), Take 1 capsule by mouth daily., Disp: , Rfl:    folic acid (FOLVITE) 1 MG tablet, TAKE 1 TABLET BY MOUTH TWICE DAILY, Disp: 180 tablet, Rfl: 2  levothyroxine (SYNTHROID) 50 MCG tablet, Take by mouth., Disp: , Rfl:    lisinopril (ZESTRIL) 2.5 MG tablet, Take by mouth., Disp: , Rfl:    metFORMIN (GLUCOPHAGE) 500 MG tablet, Take 500 mg by mouth 2 (two) times daily with a meal., Disp: , Rfl:    methotrexate (RHEUMATREX) 2.5 MG tablet, TAKE EIGHT TABLETS BY MOUTH ONCE WEEKLY, Disp: 96 tablet, Rfl: 0   pantoprazole (PROTONIX) 40 MG tablet, Take 40 mg by mouth daily., Disp: , Rfl:    sildenafil (VIAGRA) 25 MG tablet, Take 25 mg by mouth daily as needed for erectile dysfunction., Disp: , Rfl:    zolpidem (AMBIEN)  10 MG tablet, Take 10 mg by mouth at bedtime as needed for sleep. Reported on 01/20/2016, Disp: , Rfl:    ketorolac (TORADOL) 10 MG tablet, Take 10 mg by mouth 4 (four) times daily as needed. (Patient not taking: Reported on 05/24/2021), Disp: , Rfl:    meloxicam (MOBIC) 7.5 MG tablet, Take 7.5 mg by mouth 2 (two) times daily as needed. (Patient not taking: Reported on 05/24/2021), Disp: , Rfl: :  :  No Known Allergies:  FH: No family history of malignancy.  SOCIAL HISTORY: He lives in [place].  He is married.  He works as a Administrator.  He has a daughter age 24 and an adopted daughter age 72.  Current tobacco use reported at 1 to 1-1/2 packs/day.  He has been smoking intermittently since he was 60 years old.  He denies EtOH intake.  Review of Systems: No anorexia or weight loss.  No fevers or sweats.  Generalized headache for the past 6 months.  No double vision.  No mouth sores or ulcers.  He denies sore throat.  He notes that food intermittently gets "stuck" in the back of his throat over the past 6 to 8 months.  No shortness of breath.  He has a cough which he relates to smoking.  No nausea or vomiting.  No change in bowel habits.  No leg swelling.  Physical Exam:  Blood pressure (!) 141/95, pulse 74, temperature 97.8 F (36.6 C), temperature source Oral, resp. rate 18, height '6\' 2"'$  (1.88 m), weight 278 lb 6.4 oz (126.3 kg), SpO2 94 %.  HEENT: No thrush or ulcers.  No mass in the oral cavity. Lungs: Distant breath sounds.  Scattered wheezes anteriorly.  No respiratory distress. Cardiac: Distant heart sounds, regular. Abdomen: Soft and nontender.  No hepatosplenomegaly.  No mass. Vascular: Trace bilateral lower leg edema. Lymph nodes: Approximate 2 cm left cervical lymph node.  No other palpable cervical lymph nodes.  No supraclavicular, axillary or inguinal lymph nodes. Neurologic: Alert and oriented.  LABS: Pending  RADIOLOGY:  MR CERVICAL SPINE WO CONTRAST  Result Date:  04/27/2021 CLINICAL DATA:  Neck pain for 3 months after surgery. EXAM: MRI CERVICAL SPINE WITHOUT CONTRAST TECHNIQUE: Multiplanar, multisequence MR imaging of the cervical spine was performed. No intravenous contrast was administered. COMPARISON:  12/21/2011 FINDINGS: Alignment: Physiologic Vertebrae: No fracture, evidence of discitis, or bone lesion. Solid arthrodesis from C3-C5. ACDF at C6-T1. Cord: Normal cord signal and morphology. Posterior Fossa, vertebral arteries, paraspinal tissues: Faint palpable marker in the region of the lateral left neck. Bilateral enlarged upper jugular chain lymph nodes. Right level 2 node measures 18 mm in length. Disc levels: C2-3: Tiny central protrusion.  Negative facets C3-4: ACDF with solid arthrodesis. C4-5: ACDF with solid arthrodesis.  No impingement C5-6: Disc bulging with small central protrusion. Patent canal  and foramina C6-7: ACDF.  Patent canal and foramina C7-T1:ACDF.  Patent canal and foramina. IMPRESSION: 1. Bilateral upper jugular lymphadenopathy which presumably relates to the palpable complaint. Most of the neck is obscured for cervical spine optimization. If imaging is needed, CT neck with contrast would be the best modality. 2. C3-C5 ACDF with solid arthrodesis.  C6-T1 ACDF. 3. No neural compression or visible inflammation. Electronically Signed   By: Monte Fantasia M.D.   On: 04/27/2021 21:40    Assessment and Plan:   Neck adenopathy MRI cervical spine 04/27/2021-bilateral enlarged upper jugular chain lymph nodes.  Right level 2 node measures 18 mm Rheumatoid arthritis Diabetes COPD Tobacco use Hypothyroid Remote history ITP, age 71  Dustin [name] has been referred for evaluation of neck adenopathy.  Dr. Benay Spice reviewed potential etiologies including inflammation, infection, malignancy.  We are referring him for a neck CT.  We made a referral to ENT.  He will return to the lab today for a CBC, chemistry panel, LDH and sed rate.  He will return for  follow-up once CT and ENT evaluation are complete.  Patient seen with Dr. Benay Spice.  Ned Card, NP 05/24/2021, 8:58 AM   This was a shared visit with Ned Card.  Dustin [name] was interviewed and examined.  He is referred for evaluation of cervical lymphadenopathy.  There is a single palpable left neck node on exam today.  No clinical or physical exam evidence of a primary tumor site.  We discussed the differential diagnosis with Dustin [name] and his wife.  This includes noninflammatory lymph nodes, lymphoma, and a metastatic carcinoma.  He is at risk for head and neck and lung cancer with his history of smoking.  He complains of headaches.  We cannot relate the headaches to the cervical lymphadenopathy at present.  He will be referred for a CT of the neck and ENT evaluation.  I was present for greater than 50% of today's visit.  I performed medical stage making.  Julieanne Manson, MD

## 2021-05-24 ENCOUNTER — Inpatient Hospital Stay: Payer: BC Managed Care – PPO | Attending: Nurse Practitioner | Admitting: Nurse Practitioner

## 2021-05-24 ENCOUNTER — Other Ambulatory Visit: Payer: Self-pay

## 2021-05-24 ENCOUNTER — Encounter: Payer: Self-pay | Admitting: Nurse Practitioner

## 2021-05-24 ENCOUNTER — Inpatient Hospital Stay: Payer: BC Managed Care – PPO

## 2021-05-24 VITALS — BP 141/95 | HR 74 | Temp 97.8°F | Resp 18 | Ht 74.0 in | Wt 278.4 lb

## 2021-05-24 DIAGNOSIS — E039 Hypothyroidism, unspecified: Secondary | ICD-10-CM | POA: Diagnosis not present

## 2021-05-24 DIAGNOSIS — J449 Chronic obstructive pulmonary disease, unspecified: Secondary | ICD-10-CM | POA: Diagnosis not present

## 2021-05-24 DIAGNOSIS — M069 Rheumatoid arthritis, unspecified: Secondary | ICD-10-CM | POA: Diagnosis not present

## 2021-05-24 DIAGNOSIS — R591 Generalized enlarged lymph nodes: Secondary | ICD-10-CM

## 2021-05-24 DIAGNOSIS — E119 Type 2 diabetes mellitus without complications: Secondary | ICD-10-CM | POA: Diagnosis not present

## 2021-05-24 DIAGNOSIS — D693 Immune thrombocytopenic purpura: Secondary | ICD-10-CM | POA: Diagnosis not present

## 2021-05-24 DIAGNOSIS — F17210 Nicotine dependence, cigarettes, uncomplicated: Secondary | ICD-10-CM | POA: Diagnosis not present

## 2021-05-24 DIAGNOSIS — I10 Essential (primary) hypertension: Secondary | ICD-10-CM | POA: Diagnosis not present

## 2021-05-24 DIAGNOSIS — Z79899 Other long term (current) drug therapy: Secondary | ICD-10-CM | POA: Insufficient documentation

## 2021-05-24 LAB — CBC WITH DIFFERENTIAL (CANCER CENTER ONLY)
Abs Immature Granulocytes: 0.02 10*3/uL (ref 0.00–0.07)
Basophils Absolute: 0.1 10*3/uL (ref 0.0–0.1)
Basophils Relative: 1 %
Eosinophils Absolute: 0.2 10*3/uL (ref 0.0–0.5)
Eosinophils Relative: 2 %
HCT: 43 % (ref 39.0–52.0)
Hemoglobin: 14.1 g/dL (ref 13.0–17.0)
Immature Granulocytes: 0 %
Lymphocytes Relative: 21 %
Lymphs Abs: 1.6 10*3/uL (ref 0.7–4.0)
MCH: 32.8 pg (ref 26.0–34.0)
MCHC: 32.8 g/dL (ref 30.0–36.0)
MCV: 100 fL (ref 80.0–100.0)
Monocytes Absolute: 0.5 10*3/uL (ref 0.1–1.0)
Monocytes Relative: 7 %
Neutro Abs: 5.4 10*3/uL (ref 1.7–7.7)
Neutrophils Relative %: 69 %
Platelet Count: 234 10*3/uL (ref 150–400)
RBC: 4.3 MIL/uL (ref 4.22–5.81)
RDW: 12.7 % (ref 11.5–15.5)
WBC Count: 7.8 10*3/uL (ref 4.0–10.5)
nRBC: 0 % (ref 0.0–0.2)

## 2021-05-24 LAB — CMP (CANCER CENTER ONLY)
ALT: 10 U/L (ref 0–44)
AST: 11 U/L — ABNORMAL LOW (ref 15–41)
Albumin: 4.2 g/dL (ref 3.5–5.0)
Alkaline Phosphatase: 52 U/L (ref 38–126)
Anion gap: 5 (ref 5–15)
BUN: 18 mg/dL (ref 8–23)
CO2: 31 mmol/L (ref 22–32)
Calcium: 9 mg/dL (ref 8.9–10.3)
Chloride: 103 mmol/L (ref 98–111)
Creatinine: 0.86 mg/dL (ref 0.61–1.24)
GFR, Estimated: >60 mL/min (ref >60)
Glucose, Bld: 145 mg/dL — ABNORMAL HIGH (ref 70–99)
Potassium: 4.7 mmol/L (ref 3.5–5.1)
Sodium: 139 mmol/L (ref 135–145)
Total Bilirubin: 1 mg/dL (ref 0.3–1.2)
Total Protein: 7.1 g/dL (ref 6.5–8.1)

## 2021-05-24 LAB — SEDIMENTATION RATE: Sed Rate: 20 mm/hr — ABNORMAL HIGH (ref 0–16)

## 2021-05-24 LAB — LACTATE DEHYDROGENASE: LDH: 179 U/L (ref 98–192)

## 2021-05-25 ENCOUNTER — Ambulatory Visit (HOSPITAL_COMMUNITY)
Admission: RE | Admit: 2021-05-25 | Discharge: 2021-05-25 | Disposition: A | Discharge status: Home / Self Care | Payer: BC Managed Care – PPO | Source: Ambulatory Visit | Attending: Nurse Practitioner | Admitting: Nurse Practitioner

## 2021-05-25 DIAGNOSIS — C119 Malignant neoplasm of nasopharynx, unspecified: Secondary | ICD-10-CM | POA: Diagnosis not present

## 2021-05-25 DIAGNOSIS — R590 Localized enlarged lymph nodes: Secondary | ICD-10-CM | POA: Diagnosis not present

## 2021-05-25 DIAGNOSIS — R591 Generalized enlarged lymph nodes: Secondary | ICD-10-CM | POA: Diagnosis not present

## 2021-05-25 DIAGNOSIS — D3705 Neoplasm of uncertain behavior of pharynx: Secondary | ICD-10-CM | POA: Diagnosis not present

## 2021-05-25 MED ORDER — IOHEXOL 350 MG/ML SOLN
60.0000 mL | Freq: Once | INTRAVENOUS | Status: AC | PRN
  Administered 2021-05-25: 60 mL via INTRAVENOUS

## 2021-05-28 ENCOUNTER — Telehealth: Payer: Self-pay | Admitting: Nurse Practitioner

## 2021-05-28 ENCOUNTER — Other Ambulatory Visit: Payer: Self-pay | Admitting: *Deleted

## 2021-05-28 DIAGNOSIS — R591 Generalized enlarged lymph nodes: Secondary | ICD-10-CM

## 2021-05-28 NOTE — Telephone Encounter (Signed)
Reviewed CT results with Dustin [name].  Questions answered.  He has an appointment with Dr. Constance Holster 06/01/2021.

## 2021-06-01 ENCOUNTER — Encounter (HOSPITAL_COMMUNITY): Payer: Self-pay | Admitting: Otolaryngology

## 2021-06-01 ENCOUNTER — Ambulatory Visit: Payer: BC Managed Care – PPO | Admitting: Oncology

## 2021-06-01 DIAGNOSIS — J392 Other diseases of pharynx: Secondary | ICD-10-CM | POA: Diagnosis not present

## 2021-06-01 DIAGNOSIS — F17210 Nicotine dependence, cigarettes, uncomplicated: Secondary | ICD-10-CM | POA: Diagnosis not present

## 2021-06-01 DIAGNOSIS — R590 Localized enlarged lymph nodes: Secondary | ICD-10-CM | POA: Diagnosis not present

## 2021-06-01 NOTE — H&P (Signed)
HPI:   Dustin [name] is a 60 y.o. male who presents as a consult Patient.   Referring Provider: Electa Sniff,*  Chief complaint: Nasopharyngeal tumor.  HPI: He noticed a lump in his left neck about 6 or 7 weeks ago. He was treated with antibiotics without any effect. He has had some headache for about 6 months which is unusual for him. He is a chronic smoker. He quit drinking years ago. He denies any trouble swallowing or sore throat. He denies nosebleeds or nasal obstruction. He had an MRI and then a CT scan which revealed multiple enlarged lymph nodes worse on the left. 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  PMH/Meds/All/SocHx/FamHx/ROS:   Past Medical History:  Diagnosis Date   Diabetes mellitus ([place])   Past Surgical History:  Procedure Laterality Date   CARPAL TUNNEL RELEASE   CHOLECYSTECTOMY   HERNIA REPAIR   NECK SURGERY   TONSILLECTOMY   No family history of bleeding disorders, wound healing problems or difficulty with anesthesia.   Social History   Socioeconomic History   Marital status: Married  Spouse name: Not on file   Number of children: Not on file   Years of education: Not on file   Highest education level: Not on file  Occupational History   Not on file  Tobacco Use   Smoking status: Current Every Day Smoker  Packs/day: 1.50   Smokeless tobacco: Never Used  Vaping Use   Vaping Use: Never used  Substance and Sexual Activity   Alcohol use: Not on file   Drug use: Not on file   Sexual activity: Not on file  Other Topics Concern   Not on file  Social History Narrative   Not on file   Social Determinants of Health   Financial Resource Strain: Not on file  Food Insecurity: Not on file  Transportation Needs: Not on file  Physical Activity: Not on file  Stress: Not on file  Social Connections: Not on file  Housing Stability: Not on file   Current Outpatient Medications:   acetaminophen (TYLENOL) 325 MG  tablet, Take 650 mg by mouth every 6 (six) hours., Disp: , Rfl:   ALPRAZolam (XANAX) 1 MG tablet, Take 1 mg by mouth 3 (three) times daily as needed., Disp: , Rfl:   atorvastatin (LIPITOR) 10 MG tablet, Take 10 mg by mouth daily., Disp: , Rfl:   folic acid (FOLVITE) 1 MG tablet, Take 1 mg by mouth 2 times daily., Disp: , Rfl:   ibuprofen (ADVIL,MOTRIN) 200 MG tablet, Take 200 mg by mouth every 6 (six) hours as needed., Disp: , Rfl:   levothyroxine (SYNTHROID) 50 MCG tablet, Take by mouth daily., Disp: , Rfl:   lisinopriL (PRINIVIL,ZESTRIL) 2.5 MG tablet, Take 2.5 mg by mouth daily., Disp: , Rfl:   metFORMIN (GLUCOPHAGE) 500 MG tablet, Take 500 mg by mouth 2 times daily., Disp: , Rfl:   methotrexate 2.5 MG tablet, TAKE 8 TABLETS BY MOUTH EVERY SATURDAY, Disp: , Rfl:   pantoprazole (PROTONIX) 40 MG tablet, Take 40 mg by mouth 2 times daily., Disp: , Rfl:   sildenafiL (VIAGRA) 25 MG tablet, sildenafil 25 mg tablet TAKE 1 TABLET BY ORAL ROUTE ONCE DAILY AS NEEDED APPROXIMATELY 1 HOUR BEFORE SEXUAL ACTIVITY, Disp: , Rfl:   zolpidem (AMBIEN) 10 mg tablet, TAKE 1 TABLET BY MOUTH ONCE DAILY AT BEDTIME AS NEEDED, Disp: , Rfl:   A complete ROS was performed with pertinent positives/negatives noted in the  HPI. The remainder of the ROS are negative.   Physical Exam:   BP 154/80  Pulse 87  Temp 97.3 F (36.3 C)  Ht 1.854 m ('6\' 1"'$ )  Wt 126.1 kg (278 lb)  BMI 36.68 kg/m   General: Healthy and alert, in no distress, breathing easily. Normal affect. In a pleasant mood. Head: Normocephalic, atraumatic. No masses, or scars. Eyes: Pupils are equal, and reactive to light. Vision is grossly intact. No spontaneous or gaze nystagmus. Ears: Ear canals are clear. Tympanic membranes are intact, with normal landmarks and the middle ears are clear and healthy. Hearing: Grossly normal. Nose: Nasal cavities are clear with healthy mucosa, no polyps or exudate. Airways are patent. Face: No masses or scars, facial  nerve function is symmetric. Oral Cavity: No mucosal abnormalities are noted. Tongue with normal mobility. Dentition appears healthy. Oropharynx: Tonsils are symmetric. There are no mucosal masses identified. Tongue base appears normal and healthy. Larynx/Hypopharynx: indirect exam reveals healthy, mobile vocal cords, without mucosal lesions in the hypopharynx or larynx. Chest: Deferred Neck: 2 cm slightly tender left level 2 node, no thyroid nodules or enlargement. Neuro: Cranial nerves II-XII with normal function. Balance: Normal gate. Other findings: none.  Independent Review of Additional Tests or Records:  CT scan:  Procedures:  Fiberoptic nasal endoscopy:  Topical Afrin/Xylocaine was applied to the nasal cavities. The flexible scope was passed down bilaterally. The nasal cavities are clear. In the nasopharynx there is irregular fullness left greater than right covered by mucosa involving the superior nasopharynx. There is no surface ulceration.  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 Due to the submucosal nature I would like to do a biopsy in the operating room under anesthesia using nasopharyngoscopy. This will be performed on Monday. We will present him at tumor board later in the week. He will likely require PET scan imaging. We had a long discussion about work-up and treatment including chemo/radiation. All questions were answered.

## 2021-06-01 NOTE — Progress Notes (Signed)
DUE TO COVID-19 ONLY ONE VISITOR IS ALLOWED TO COME WITH YOU AND STAY IN THE WAITING ROOM ONLY DURING PRE OP AND PROCEDURE DAY OF SURGERY.  PCP - Dr Redmond School Cardiologist - n/a Rheumatology - Dr Bo Merino  Chest x-ray - 03/04/21 (2V) EKG - 03/04/21 Stress Test - 03/05/21 ECHO - 03/05/21 Cardiac Cath - 01/22/16  ICD Pacemaker/Loop - n/a  Sleep Study -  n/a CPAP - none  DM, Type 2 Fasting Blood Sugar - unknown Checks Blood Sugar 0 times a day  Do not take metformin on the morning of surgery.  STOP now taking any Aspirin (unless otherwise instructed by your surgeon), Aleve, Naproxen, Ibuprofen, Motrin, Advil, Goody's, BC's, all herbal medications, fish oil, and all vitamins.   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  [place]  Patient verbalized understanding of instructions that were given via phone.

## 2021-06-04 ENCOUNTER — Encounter (HOSPITAL_COMMUNITY)
Admission: RE | Disposition: A | Discharge status: Home / Self Care | Payer: Self-pay | Source: Home / Self Care | Attending: Otolaryngology

## 2021-06-04 ENCOUNTER — Ambulatory Visit (HOSPITAL_COMMUNITY)
Admission: RE | Admit: 2021-06-04 | Discharge: 2021-06-04 | Disposition: A | Discharge status: Home / Self Care | Payer: BC Managed Care – PPO | Attending: Otolaryngology | Admitting: Otolaryngology

## 2021-06-04 ENCOUNTER — Ambulatory Visit (HOSPITAL_COMMUNITY): Payer: BC Managed Care – PPO | Admitting: Certified Registered Nurse Anesthetist

## 2021-06-04 ENCOUNTER — Encounter (HOSPITAL_COMMUNITY): Payer: Self-pay | Admitting: Otolaryngology

## 2021-06-04 DIAGNOSIS — F17210 Nicotine dependence, cigarettes, uncomplicated: Secondary | ICD-10-CM | POA: Insufficient documentation

## 2021-06-04 DIAGNOSIS — Z79890 Hormone replacement therapy (postmenopausal): Secondary | ICD-10-CM | POA: Diagnosis not present

## 2021-06-04 DIAGNOSIS — F418 Other specified anxiety disorders: Secondary | ICD-10-CM | POA: Diagnosis not present

## 2021-06-04 DIAGNOSIS — J392 Other diseases of pharynx: Secondary | ICD-10-CM | POA: Diagnosis not present

## 2021-06-04 DIAGNOSIS — Z79899 Other long term (current) drug therapy: Secondary | ICD-10-CM | POA: Diagnosis not present

## 2021-06-04 DIAGNOSIS — C112 Malignant neoplasm of lateral wall of nasopharynx: Secondary | ICD-10-CM | POA: Insufficient documentation

## 2021-06-04 DIAGNOSIS — C119 Malignant neoplasm of nasopharynx, unspecified: Secondary | ICD-10-CM | POA: Diagnosis not present

## 2021-06-04 DIAGNOSIS — Z7984 Long term (current) use of oral hypoglycemic drugs: Secondary | ICD-10-CM | POA: Insufficient documentation

## 2021-06-04 DIAGNOSIS — M069 Rheumatoid arthritis, unspecified: Secondary | ICD-10-CM | POA: Diagnosis not present

## 2021-06-04 DIAGNOSIS — R221 Localized swelling, mass and lump, neck: Secondary | ICD-10-CM | POA: Diagnosis not present

## 2021-06-04 DIAGNOSIS — E785 Hyperlipidemia, unspecified: Secondary | ICD-10-CM | POA: Diagnosis not present

## 2021-06-04 HISTORY — DX: Hypothyroidism, unspecified: E03.9

## 2021-06-04 HISTORY — PX: NASOPHARYNGOSCOPY: SHX5210

## 2021-06-04 LAB — GLUCOSE, CAPILLARY
Glucose-Capillary: 141 mg/dL — ABNORMAL HIGH (ref 70–99)
Glucose-Capillary: 127 mg/dL — ABNORMAL HIGH (ref 70–99)

## 2021-06-04 SURGERY — NASOPHARYNGOSCOPY
Anesthesia: General | Site: Nose

## 2021-06-04 MED ORDER — CHLORHEXIDINE GLUCONATE 0.12 % MT SOLN
OROMUCOSAL | Status: AC
  Administered 2021-06-04: 15 mL via OROMUCOSAL
  Filled 2021-06-04: qty 15

## 2021-06-04 MED ORDER — IPRATROPIUM-ALBUTEROL 0.5-2.5 (3) MG/3ML IN SOLN
RESPIRATORY_TRACT | Status: AC
  Administered 2021-06-04: 3 mL via RESPIRATORY_TRACT
  Filled 2021-06-04: qty 3

## 2021-06-04 MED ORDER — PROPOFOL 10 MG/ML IV BOLUS
INTRAVENOUS | Status: AC
  Filled 2021-06-04: qty 20

## 2021-06-04 MED ORDER — SUGAMMADEX SODIUM 500 MG/5ML IV SOLN
INTRAVENOUS | Status: AC
  Filled 2021-06-04: qty 5

## 2021-06-04 MED ORDER — ROCURONIUM BROMIDE 10 MG/ML (PF) SYRINGE
PREFILLED_SYRINGE | INTRAVENOUS | Status: DC | PRN
Start: 2021-06-04 — End: 2021-06-04
  Administered 2021-06-04: 60 mg via INTRAVENOUS

## 2021-06-04 MED ORDER — EPHEDRINE SULFATE-NACL 50-0.9 MG/10ML-% IV SOSY
PREFILLED_SYRINGE | INTRAVENOUS | Status: DC | PRN
  Administered 2021-06-04: 5 mg via INTRAVENOUS

## 2021-06-04 MED ORDER — MIDAZOLAM HCL 5 MG/5ML IJ SOLN
INTRAMUSCULAR | Status: DC | PRN
  Administered 2021-06-04: 2 mg via INTRAVENOUS

## 2021-06-04 MED ORDER — PHENYLEPHRINE 40 MCG/ML (10ML) SYRINGE FOR IV PUSH (FOR BLOOD PRESSURE SUPPORT)
PREFILLED_SYRINGE | INTRAVENOUS | Status: AC
  Filled 2021-06-04: qty 10

## 2021-06-04 MED ORDER — ONDANSETRON HCL 4 MG/2ML IJ SOLN
INTRAMUSCULAR | Status: DC | PRN
  Administered 2021-06-04: 4 mg via INTRAVENOUS

## 2021-06-04 MED ORDER — PROPOFOL 10 MG/ML IV BOLUS
INTRAVENOUS | Status: DC | PRN
  Administered 2021-06-04: 200 mg via INTRAVENOUS

## 2021-06-04 MED ORDER — SUGAMMADEX SODIUM 500 MG/5ML IV SOLN
INTRAVENOUS | Status: DC | PRN
  Administered 2021-06-04: 300 mg via INTRAVENOUS

## 2021-06-04 MED ORDER — PHENYLEPHRINE 40 MCG/ML (10ML) SYRINGE FOR IV PUSH (FOR BLOOD PRESSURE SUPPORT)
PREFILLED_SYRINGE | INTRAVENOUS | Status: DC | PRN
Start: 2021-06-04 — End: 2021-06-04
  Administered 2021-06-04 (×5): 80 ug via INTRAVENOUS

## 2021-06-04 MED ORDER — LIDOCAINE 2% (20 MG/ML) 5 ML SYRINGE
INTRAMUSCULAR | Status: DC | PRN
  Administered 2021-06-04: 60 mg via INTRAVENOUS

## 2021-06-04 MED ORDER — ACETAMINOPHEN 10 MG/ML IV SOLN: 1000.0000 mg | Freq: Once | INTRAVENOUS | Status: DC | PRN

## 2021-06-04 MED ORDER — AMISULPRIDE (ANTIEMETIC) 5 MG/2ML IV SOLN: 10.0000 mg | Freq: Once | INTRAVENOUS | Status: DC | PRN

## 2021-06-04 MED ORDER — BACITRACIN ZINC 500 UNIT/GM EX OINT
TOPICAL_OINTMENT | CUTANEOUS | Status: AC
  Filled 2021-06-04: qty 28.35

## 2021-06-04 MED ORDER — FENTANYL CITRATE (PF) 250 MCG/5ML IJ SOLN
INTRAMUSCULAR | Status: AC
  Filled 2021-06-04: qty 5

## 2021-06-04 MED ORDER — OXYMETAZOLINE HCL 0.05 % NA SOLN: 2.0000 | NASAL | Status: DC

## 2021-06-04 MED ORDER — FENTANYL CITRATE (PF) 100 MCG/2ML IJ SOLN: 25.0000 ug | INTRAMUSCULAR | Status: DC | PRN

## 2021-06-04 MED ORDER — LIDOCAINE-EPINEPHRINE 1 %-1:100000 IJ SOLN
INTRAMUSCULAR | Status: DC | PRN
  Administered 2021-06-04: 2 mL

## 2021-06-04 MED ORDER — KETOROLAC TROMETHAMINE 30 MG/ML IJ SOLN: 30.0000 mg | Freq: Once | INTRAMUSCULAR | Status: DC

## 2021-06-04 MED ORDER — LACTATED RINGERS IV SOLN: INTRAVENOUS | Status: DC | PRN

## 2021-06-04 MED ORDER — OXYMETAZOLINE HCL 0.05 % NA SOLN
NASAL | Status: DC | PRN
  Administered 2021-06-04: 1 via TOPICAL

## 2021-06-04 MED ORDER — DEXAMETHASONE SODIUM PHOSPHATE 10 MG/ML IJ SOLN
INTRAMUSCULAR | Status: DC | PRN
  Administered 2021-06-04: 5 mg via INTRAVENOUS

## 2021-06-04 MED ORDER — OXYCODONE HCL 5 MG PO TABS: 5.0000 mg | ORAL_TABLET | Freq: Once | ORAL | Status: DC | PRN

## 2021-06-04 MED ORDER — OXYCODONE HCL 5 MG/5ML PO SOLN: 5.0000 mg | Freq: Once | ORAL | Status: DC | PRN

## 2021-06-04 MED ORDER — 0.9 % SODIUM CHLORIDE (POUR BTL) OPTIME
TOPICAL | Status: DC | PRN
Start: 2021-06-04 — End: 2021-06-04
  Administered 2021-06-04: 1000 mL

## 2021-06-04 MED ORDER — FENTANYL CITRATE (PF) 250 MCG/5ML IJ SOLN
INTRAMUSCULAR | Status: DC | PRN
  Administered 2021-06-04: 50 ug via INTRAVENOUS

## 2021-06-04 MED ORDER — PROMETHAZINE HCL 25 MG/ML IJ SOLN: 6.2500 mg | INTRAMUSCULAR | Status: DC | PRN

## 2021-06-04 MED ORDER — OXYMETAZOLINE HCL 0.05 % NA SOLN
NASAL | Status: AC
  Administered 2021-06-04: 2 via NASAL
  Filled 2021-06-04: qty 30

## 2021-06-04 MED ORDER — CHLORHEXIDINE GLUCONATE 0.12 % MT SOLN: 15.0000 mL | Freq: Once | OROMUCOSAL | Status: AC

## 2021-06-04 MED ORDER — LIDOCAINE 2% (20 MG/ML) 5 ML SYRINGE
INTRAMUSCULAR | Status: AC
  Filled 2021-06-04: qty 5

## 2021-06-04 MED ORDER — IPRATROPIUM-ALBUTEROL 0.5-2.5 (3) MG/3ML IN SOLN: 3.0000 mL | Freq: Once | RESPIRATORY_TRACT | Status: AC

## 2021-06-04 MED ORDER — ROCURONIUM BROMIDE 10 MG/ML (PF) SYRINGE
PREFILLED_SYRINGE | INTRAVENOUS | Status: AC
  Filled 2021-06-04: qty 10

## 2021-06-04 MED ORDER — LIDOCAINE-EPINEPHRINE 1 %-1:100000 IJ SOLN
INTRAMUSCULAR | Status: AC
  Filled 2021-06-04: qty 1

## 2021-06-04 MED ORDER — ONDANSETRON HCL 4 MG/2ML IJ SOLN
INTRAMUSCULAR | Status: AC
  Filled 2021-06-04: qty 2

## 2021-06-04 MED ORDER — ORAL CARE MOUTH RINSE: 15.0000 mL | Freq: Once | OROMUCOSAL | Status: AC

## 2021-06-04 MED ORDER — OXYMETAZOLINE HCL 0.05 % NA SOLN
NASAL | Status: AC
  Filled 2021-06-04: qty 30

## 2021-06-04 MED ORDER — LACTATED RINGERS IV SOLN: INTRAVENOUS | Status: DC

## 2021-06-04 MED ORDER — MIDAZOLAM HCL 2 MG/2ML IJ SOLN
INTRAMUSCULAR | Status: AC
  Filled 2021-06-04: qty 2

## 2021-06-04 SURGICAL SUPPLY — 52 items
BAG COUNTER SPONGE SURGICOUNT (BAG) ×3 IMPLANT
BAG SPNG CNTER NS LX DISP (BAG) ×1
BLADE RAD40 ROTATE 4M 4 5PK (BLADE) IMPLANT
BLADE RAD60 ROTATE M4 4 5PK (BLADE) IMPLANT
BLADE SURG 15 STRL LF DISP TIS (BLADE) IMPLANT
BLADE SURG 15 STRL SS (BLADE) ×2
BLADE TRICUT ROTATE M4 4 5PK (BLADE) ×1 IMPLANT
CANISTER SUCT 3000ML PPV (MISCELLANEOUS) ×3 IMPLANT
CNTNR URN SCR LID CUP LEK RST (MISCELLANEOUS) IMPLANT
COAGULATOR SUCT SWTCH 10FR 6 (ELECTROSURGICAL) ×1 IMPLANT
CONT SPEC 4OZ STRL OR WHT (MISCELLANEOUS) ×2
DRAPE HALF SHEET 40X57 (DRAPES) IMPLANT
DRESSING NASAL KENNEDY 3.5X.9 (MISCELLANEOUS) IMPLANT
DRSG NASAL KENNEDY 3.5X.9 (MISCELLANEOUS)
DRSG NASOPORE 8CM (GAUZE/BANDAGES/DRESSINGS) IMPLANT
DRSG TELFA 3X8 NADH (GAUZE/BANDAGES/DRESSINGS) ×2 IMPLANT
ELECT REM PT RETURN 9FT ADLT (ELECTROSURGICAL)
ELECTRODE REM PT RTRN 9FT ADLT (ELECTROSURGICAL) IMPLANT
FILTER ARTHROSCOPY CONVERTOR (FILTER) IMPLANT
GAUZE SPONGE 2X2 8PLY STRL LF (GAUZE/BANDAGES/DRESSINGS) ×1 IMPLANT
GLOVE SURG LTX SZ7.5 (GLOVE) ×3 IMPLANT
GOWN STRL REUS W/ TWL LRG LVL3 (GOWN DISPOSABLE) ×4 IMPLANT
GOWN STRL REUS W/TWL LRG LVL3 (GOWN DISPOSABLE) ×4
KIT BASIN OR (CUSTOM PROCEDURE TRAY) ×3 IMPLANT
KIT TURNOVER KIT B (KITS) ×3 IMPLANT
NDL PRECISIONGLIDE 27X1.5 (NEEDLE) ×2 IMPLANT
NDL SPNL 22GX3.5 QUINCKE BK (NEEDLE) IMPLANT
NDL SPNL 25GX3.5 QUINCKE BL (NEEDLE) IMPLANT
NEEDLE PRECISIONGLIDE 27X1.5 (NEEDLE) ×2 IMPLANT
NEEDLE SPNL 22GX3.5 QUINCKE BK (NEEDLE) ×2 IMPLANT
NEEDLE SPNL 25GX3.5 QUINCKE BL (NEEDLE) ×2 IMPLANT
NS IRRIG 1000ML POUR BTL (IV SOLUTION) ×3 IMPLANT
PAD ARMBOARD 7.5X6 YLW CONV (MISCELLANEOUS) ×6 IMPLANT
PAD DRESSING TELFA 3X8 NADH (GAUZE/BANDAGES/DRESSINGS) ×1 IMPLANT
PATTIES SURGICAL .5 X3 (DISPOSABLE) ×3 IMPLANT
POSITIONER HEAD DONUT 9IN (MISCELLANEOUS) ×2 IMPLANT
SHEATH ENDOSCRUB 0 DEG (SHEATH) IMPLANT
SHEATH ENDOSCRUB 30 DEG (SHEATH) IMPLANT
SPECIMEN JAR SMALL (MISCELLANEOUS) ×1 IMPLANT
SPONGE GAUZE 2X2 STER 10/PKG (GAUZE/BANDAGES/DRESSINGS) ×1
SUT CHROMIC 4 0 P 3 18 (SUTURE) IMPLANT
SUT ETHILON 3 0 PS 1 (SUTURE) IMPLANT
SUT PLAIN 4 0 ~~LOC~~ 1 (SUTURE) IMPLANT
SWAB COLLECTION DEVICE MRSA (MISCELLANEOUS) IMPLANT
SWAB CULTURE ESWAB REG 1ML (MISCELLANEOUS) IMPLANT
SYR 50ML SLIP (SYRINGE) IMPLANT
TAPE PAPER 1X10 WHT MICROPORE (GAUZE/BANDAGES/DRESSINGS) ×1 IMPLANT
TOWEL GREEN STERILE FF (TOWEL DISPOSABLE) ×3 IMPLANT
TRAY ENT MC OR (CUSTOM PROCEDURE TRAY) ×3 IMPLANT
TUBE CONNECTING 12X1/4 (SUCTIONS) ×2 IMPLANT
TUBING EXTENTION W/L.L. (IV SETS) IMPLANT
WATER STERILE IRR 1000ML POUR (IV SOLUTION) ×2 IMPLANT

## 2021-06-04 NOTE — Anesthesia Postprocedure Evaluation (Signed)
Anesthesia Post Note  Patient: Dustin [name]  Procedure(s) Performed: NASAL ENDOSCOPY WITH BIOPSY OF NASOPHARYNX; FROZEN SECTION (Nose)     Patient location during evaluation: PACU Anesthesia Type: General Level of consciousness: awake and alert Pain management: pain level controlled Vital Signs Assessment: post-procedure vital signs reviewed and stable Respiratory status: spontaneous breathing, nonlabored ventilation, respiratory function stable and patient connected to nasal cannula oxygen Cardiovascular status: blood pressure returned to baseline and stable Postop Assessment: no apparent nausea or vomiting Anesthetic complications: no   No notable events documented.  Last Vitals:  Vitals:   06/04/21 1300 06/04/21 1315  BP: 129/74 133/82  Pulse: 73 80  Resp: 14 11  Temp:    SpO2: (!) 87% 99%    Last Pain:  Vitals:   06/04/21 1245  TempSrc:   PainSc: 0-No pain                 Tiajuana Amass

## 2021-06-04 NOTE — Transfer of Care (Signed)
Immediate Anesthesia Transfer of Care Note  Patient: Zada Finders  Procedure(s) Performed: NASAL ENDOSCOPY WITH BIOPSY OF NASOPHARYNX; FROZEN SECTION (Nose)  Patient Location: PACU  Anesthesia Type:General  Level of Consciousness: awake, alert  and oriented  Airway & Oxygen Therapy: Patient Spontanous Breathing  Post-op Assessment: Report given to RN, Post -op Vital signs reviewed and stable and Patient moving all extremities X 4  Post vital signs: Reviewed and stable  Last Vitals:  Vitals Value Taken Time  BP    Temp    Pulse 93 06/04/21 1215  Resp 17 06/04/21 1215  SpO2 93 % 06/04/21 1215  Vitals shown include unvalidated device data.  Last Pain:  Vitals:   06/04/21 1045  TempSrc:   PainSc: 5       Patients Stated Pain Goal: 2 (00/71/21 9758)  Complications: No notable events documented.

## 2021-06-04 NOTE — Anesthesia Preprocedure Evaluation (Addendum)
Anesthesia Evaluation  Patient identified by MRN, date of birth, ID band Patient awake    Reviewed: Allergy & Precautions, NPO status , Patient's Chart, lab work & pertinent test results  Airway Mallampati: II  TM Distance: >3 FB Neck ROM: Full    Dental  (+) Edentulous Upper, Missing   Pulmonary COPD, Current SmokerPatient did not abstain from smoking.,    Pulmonary exam normal breath sounds clear to auscultation       Cardiovascular hypertension, Pt. on medications Normal cardiovascular exam Rhythm:Regular Rate:Normal  ECHO: 1. Left ventricular ejection fraction, by estimation, is 60 to 65%. The left ventricle has normal function. The left ventricle has no regional wall motion abnormalities. There is mild left ventricular hypertrophy. Left ventricular diastolic parameters are indeterminate. 2. Right ventricular systolic function is normal. The right ventricular size is normal. 3. Pericardial fat pad. 4. Mild mitral valve regurgitation. 5. Aortic valve regurgitation is not visualized. Mild aortic valve sclerosis is present, with no evidence of aortic valve stenosis.   Neuro/Psych PSYCHIATRIC DISORDERS Anxiety Depression negative neurological ROS     GI/Hepatic Neg liver ROS, hiatal hernia, GERD  Medicated and Controlled,  Endo/Other  diabetes, Oral Hypoglycemic AgentsHypothyroidism   Renal/GU negative Renal ROS     Musculoskeletal  (+) Arthritis , Rheumatoid disorders,    Abdominal (+) + obese,   Peds  Hematology [facility]   Anesthesia Other Findings Nasopharyngeal mass  Reproductive/Obstetrics                           Anesthesia Physical Anesthesia Plan  ASA: 3  Anesthesia Plan: General   Post-op Pain Management:    Induction: Intravenous  PONV Risk Score and Plan: 2 and Ondansetron, Dexamethasone, Midazolam and Treatment may vary due to age or medical condition  Airway Management  Planned: Oral ETT  Additional Equipment:   Intra-op Plan:   Post-operative Plan: Extubation in OR  Informed Consent: I have reviewed the patients History and Physical, chart, labs and discussed the procedure including the risks, benefits and alternatives for the proposed anesthesia with the patient or authorized representative who has indicated his/her understanding and acceptance.     Dental advisory given  Plan Discussed with: CRNA  Anesthesia Plan Comments:       Anesthesia Quick Evaluation

## 2021-06-04 NOTE — OR Nursing (Signed)
Pathologist( Dr. Joana Reamer) called into operating room 1 at 1156 am with frozen report. Placed on speaker phone so he could directly talk to Dr. Constance Holster.

## 2021-06-04 NOTE — Interval H&P Note (Signed)
History and Physical Interval Note:  06/04/2021 11:05 AM  Dustin [name]  has presented today for surgery, with the diagnosis of Nasopharyngeal mass.  The various methods of treatment have been discussed with the patient and family. After consideration of risks, benefits and other options for treatment, the patient has consented to  Procedure(s): NASAL ENDOSCOPY WITH BIOPSY OF NASOPHARYNX; FROZEN SECTION (N/A) as a surgical intervention.  The patient's history has been reviewed, patient examined, no change in status, stable for surgery.  I have reviewed the patient's chart and labs.  Questions were answered to the patient's satisfaction.     Izora Gala

## 2021-06-04 NOTE — Op Note (Signed)
OPERATIVE REPORT  DATE OF SURGERY: 06/04/2021  PATIENT:  Dustin [name],  60 y.o. male  PRE-OPERATIVE DIAGNOSIS:  Nasopharyngeal mass  POST-OPERATIVE DIAGNOSIS:  Nasopharyngeal mass  PROCEDURE:  Procedure(s): NASAL ENDOSCOPY WITH BIOPSY OF NASOPHARYNX; FROZEN SECTION  SURGEON:  Beckie Salts, MD  ASSISTANTS: None  ANESTHESIA:   General   EBL: 30 ml  DRAINS: None  LOCAL MEDICATIONS USED:  None  SPECIMEN: Nasopharyngeal biopsy, frozen section positive for carcinoma.  COUNTS:  Correct  PROCEDURE DETAILS: The patient was taken to the operating room and placed on the operating table in the supine position. Following induction of general endotracheal anesthesia, the face was prepped and draped in a standard fashion.  Afrin spray was used preoperatively in the nasal cavities.  Afrin pledgets were placed bilaterally in the nasal cavities.  1% Xylocaine with epinephrine was infiltrated into the left side nasopharynx mucosa using a 25-gauge spinal needle.  Using a 0 degree nasal endoscope and biopsy forceps multiple samples were taken from the left side nasopharynx.  Frozen section analysis revealed findings consistent with carcinoma.  Topical Afrin was used on pledgets for hemostasis.  The pharynx was suctioned of blood and secretions.  Patient was awakened extubated and transferred to recovery in stable condition.    PATIENT DISPOSITION:  To PACU, stable

## 2021-06-04 NOTE — Anesthesia Procedure Notes (Signed)

## 2021-06-05 ENCOUNTER — Encounter (HOSPITAL_COMMUNITY): Payer: Self-pay | Admitting: Otolaryngology

## 2021-06-06 ENCOUNTER — Other Ambulatory Visit: Payer: Self-pay

## 2021-06-06 DIAGNOSIS — C119 Malignant neoplasm of nasopharynx, unspecified: Secondary | ICD-10-CM

## 2021-06-06 NOTE — Progress Notes (Signed)
Oncology Nurse Navigator Documentation   Placed introductory call to new referral patient Dustin [name] Introduced myself as the H&N oncology nurse navigator that works with Dr. Isidore Moos and Dr. Benay Spice to whom he has been referred by Dr. Constance Holster. He confirmed understanding of referral. Briefly explained my role as his navigator, provided my contact information.  Confirmed understanding of upcoming appts and [place] location, explained arrival and registration process. I explained the purpose of a dental evaluation prior to starting RT, indicated he would be contacted by [facility] to arrange an appt.   I encouraged him to call with questions/concerns as he moves forward with appts and procedures.   He verbalized understanding of information provided, expressed appreciation for my call.   Navigator Initial Assessment Employment Status: he is working Currently on [employer] / STD: yes Living Situation: he lives with his wife Support System: wife, family PCP: Redmond School MD PCD: Financial Concerns:yes Transportation Needs: no Sensory Deficits:no Language Barriers/Interpreter Needed:  no Ambulation Needs: no DME Used in Home: no Psychosocial Needs:  no Concerns/Needs Understanding Cancer:  addressed/answered by navigator to best of ability Self-Expressed Needs: no   Clinical biochemist, BSN, OCN Head & Neck Oncology Nurse [place] [facility] Phone # [phone]  Fax # [phone]

## 2021-06-07 ENCOUNTER — Telehealth: Payer: Self-pay | Admitting: *Deleted

## 2021-06-07 ENCOUNTER — Other Ambulatory Visit: Payer: Self-pay | Admitting: Oncology

## 2021-06-07 ENCOUNTER — Other Ambulatory Visit: Payer: Self-pay

## 2021-06-07 DIAGNOSIS — C119 Malignant neoplasm of nasopharynx, unspecified: Secondary | ICD-10-CM

## 2021-06-07 MED ORDER — TRAMADOL HCL 50 MG PO TABS: 50.0000 mg | ORAL_TABLET | Freq: Four times a day (QID) | ORAL | 0 refills | Status: DC | PRN

## 2021-06-08 LAB — SURGICAL PATHOLOGY

## 2021-06-12 ENCOUNTER — Inpatient Hospital Stay: Payer: BC Managed Care – PPO | Admitting: Oncology

## 2021-06-12 ENCOUNTER — Other Ambulatory Visit: Payer: Self-pay

## 2021-06-12 VITALS — BP 131/82 | HR 80 | Temp 98.2°F | Resp 20 | Ht 73.0 in | Wt 265.0 lb

## 2021-06-12 DIAGNOSIS — I10 Essential (primary) hypertension: Secondary | ICD-10-CM | POA: Diagnosis not present

## 2021-06-12 DIAGNOSIS — F17210 Nicotine dependence, cigarettes, uncomplicated: Secondary | ICD-10-CM | POA: Diagnosis not present

## 2021-06-12 DIAGNOSIS — D693 Immune thrombocytopenic purpura: Secondary | ICD-10-CM | POA: Diagnosis not present

## 2021-06-12 DIAGNOSIS — J449 Chronic obstructive pulmonary disease, unspecified: Secondary | ICD-10-CM | POA: Diagnosis not present

## 2021-06-12 DIAGNOSIS — E039 Hypothyroidism, unspecified: Secondary | ICD-10-CM | POA: Diagnosis not present

## 2021-06-12 DIAGNOSIS — R591 Generalized enlarged lymph nodes: Secondary | ICD-10-CM | POA: Diagnosis not present

## 2021-06-12 DIAGNOSIS — C119 Malignant neoplasm of nasopharynx, unspecified: Secondary | ICD-10-CM | POA: Diagnosis not present

## 2021-06-12 DIAGNOSIS — E119 Type 2 diabetes mellitus without complications: Secondary | ICD-10-CM | POA: Diagnosis not present

## 2021-06-12 DIAGNOSIS — Z79899 Other long term (current) drug therapy: Secondary | ICD-10-CM | POA: Diagnosis not present

## 2021-06-12 DIAGNOSIS — M069 Rheumatoid arthritis, unspecified: Secondary | ICD-10-CM | POA: Diagnosis not present

## 2021-06-12 NOTE — Progress Notes (Signed)
START ON PATHWAY REGIMEN - Head and Neck   Cisplatin 80 mg/m2 IV D1 + Gemcitabine 1,000 mg/m2 IV D1,8 q21 Days:   A cycle is every 21 days:     Gemcitabine      Cisplatin    Cisplatin 40 mg/m2 IV D1 q7 Days + RT:   A cycle is every 7 days:     Cisplatin   **Always confirm dose/schedule in your pharmacy ordering system**  Patient Characteristics: Nasopharyngeal, Stage II - IVA Disease Classification: Nasopharyngeal Current Disease Status: No Distant Metastases and No Recurrent Disease AJCC T Category: T3 AJCC N Category: N2 AJCC M Category: M0 AJCC 8 Stage Grouping: III Intent of Therapy: Curative Intent, Discussed with Patient

## 2021-06-12 NOTE — Progress Notes (Signed)
[place] OFFICE PROGRESS NOTE   Diagnosis: Nasopharyngeal carcinoma  INTERVAL HISTORY:   [name] returns for a scheduled visit.  A CT of the neck on 05/25/2021 revealed a nasopharyngeal mass eroding skull base at the level of the clivus and inferior sphenoid sinus.  An enlarged right jugulodigastric node and 2 enlarged left level 2 nodes were noted.  He was referred to Dr. Constance Holster and was taken to the operating room on 06/04/2021 for a biopsy of the nasopharyngeal mass.  The pathology revealed squamous cell carcinoma.  Mr. Trieu continues to have headaches.  Tramadol has not helped.  No new complaint.  Objective:  Vital signs in last 24 hours:  Blood pressure 131/82, pulse 80, temperature 98.2 F (36.8 C), temperature source Oral, resp. rate 20, height 6\' 1"  (1.854 m), weight 265 lb (120.2 kg), SpO2 95 %.    HEENT: Oropharynx without visible  Lymphatics: 1-2 cm firm left anterior cervical node, no other palpable cervical or supraclavicular nodes Resp: Distant breath sounds, no respiratory distress Cardio: Distant heart sounds, regular rate and rhythm GI: No hepatosplenomegaly Vascular: No leg edema  Lab Results:  Lab Results  Component Value Date   WBC 7.8 05/24/2021   HGB 14.1 05/24/2021   HCT 43.0 05/24/2021   MCV 100.0 05/24/2021   PLT 234 05/24/2021   NEUTROABS 5.4 05/24/2021    CMP  Lab Results  Component Value Date   NA 139 05/24/2021   K 4.7 05/24/2021   CL 103 05/24/2021   CO2 31 05/24/2021   GLUCOSE 145 (H) 05/24/2021   BUN 18 05/24/2021   CREATININE 0.86 05/24/2021   CALCIUM 9.0 05/24/2021   PROT 7.1 05/24/2021   ALBUMIN 4.2 05/24/2021   AST 11 (L) 05/24/2021   ALT 10 05/24/2021   ALKPHOS 52 05/24/2021   BILITOT 1.0 05/24/2021   GFRNONAA >60 05/24/2021   GFRAA 108 09/29/2020      Imaging:  Medications: I have reviewed the patient's current medications.   Assessment/Plan: Nasopharyngeal cancer, presenting with left neck  adenopathy MRI cervical spine 04/27/2021-bilateral enlarged upper jugular chain lymph nodes.  Right level 2 node measures 18 mm CT neck 05/25/2021-bilateral nasopharyngeal mass eroding the skull base at the level of the clivus and inferior sphenoid sinus, enlarged right jugular digastric node into enlarged left level 2 nodes, mild soft tissue density in the inferior left sphenoid sinus-tumor? 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  I discussed treatment options with him.  He has been scheduled for a staging PET scan and MRI of the face.  He appears to have clinical stage III (T3N2) disease.  If this is confirmed on the staging studies I will recommend induction chemotherapy with gemcitabine and cisplatin.  We reviewed potential toxicities associated with the gemcitabine and cisplatin regimen including the chance of nausea/vomiting, alopecia, hematologic toxicity, infection, and bleeding.  We discussed the nephrotoxicity, ototoxicity, and neuropathy associated with cisplatin.  We reviewed the fever, rash, and pneumonitis seen with gemcitabine.  He agrees to proceed.  Mr. Oddo will return for an office visit and further discussion after the restaging studies next week.  He will be referred for Port-A-Cath placement.  The plan is to begin gemcitabine/cisplatin during the week of 06/25/2021.  A chemotherapy plan was entered today.  [name], MD  06/12/2021  5:05 PM

## 2021-06-13 ENCOUNTER — Other Ambulatory Visit: Payer: Self-pay

## 2021-06-13 ENCOUNTER — Telehealth: Payer: Self-pay

## 2021-06-13 DIAGNOSIS — C119 Malignant neoplasm of nasopharynx, unspecified: Secondary | ICD-10-CM

## 2021-06-13 NOTE — Telephone Encounter (Signed)
Audiology referral made per Dr Benay Spice.

## 2021-06-14 ENCOUNTER — Encounter: Payer: Self-pay | Admitting: *Deleted

## 2021-06-14 ENCOUNTER — Telehealth: Payer: Self-pay

## 2021-06-14 NOTE — Progress Notes (Signed)
Completed FMLA form and provider section of his STD form. Patient will pick up on 06/20/21 when here for patient education. Copy to HIM to scan.

## 2021-06-14 NOTE — Telephone Encounter (Signed)
Patient called to let Dr. Estanislado Pandy know that he was diagnosed with Nasopharyngeal cancer.  Patient states his oncologist told him he can take his Methotrexate this Saturday, 06/16/21, but that will be his last dose until further notice.  Patient also cancelled his appointment that was scheduled for 07/06/21.  Patient states he won't be able to reschedule until he finishes with his treatment.

## 2021-06-15 ENCOUNTER — Ambulatory Visit (HOSPITAL_COMMUNITY): Payer: BC Managed Care – PPO

## 2021-06-15 NOTE — Telephone Encounter (Signed)
I returned patient's call.  He stated that he would be receiving chemotherapy and radiation therapy per Dr. Learta Codding.  He will discontinue methotrexate.  I advised him to contact us when he is ready to restart on the medications.

## 2021-06-18 ENCOUNTER — Ambulatory Visit (INDEPENDENT_AMBULATORY_CARE_PROVIDER_SITE_OTHER): Payer: BC Managed Care – PPO | Admitting: Dentistry

## 2021-06-18 ENCOUNTER — Encounter (HOSPITAL_COMMUNITY): Payer: Self-pay | Admitting: Dentistry

## 2021-06-18 ENCOUNTER — Encounter: Payer: Self-pay | Admitting: Oncology

## 2021-06-18 ENCOUNTER — Other Ambulatory Visit: Payer: Self-pay

## 2021-06-18 DIAGNOSIS — K0889 Other specified disorders of teeth and supporting structures: Secondary | ICD-10-CM

## 2021-06-18 DIAGNOSIS — K029 Dental caries, unspecified: Secondary | ICD-10-CM

## 2021-06-18 DIAGNOSIS — F40232 Fear of other medical care: Secondary | ICD-10-CM

## 2021-06-18 DIAGNOSIS — Z01818 Encounter for other preprocedural examination: Secondary | ICD-10-CM

## 2021-06-18 DIAGNOSIS — K030 Excessive attrition of teeth: Secondary | ICD-10-CM

## 2021-06-18 DIAGNOSIS — K036 Deposits [accretions] on teeth: Secondary | ICD-10-CM

## 2021-06-18 DIAGNOSIS — K08109 Complete loss of teeth, unspecified cause, unspecified class: Secondary | ICD-10-CM

## 2021-06-18 DIAGNOSIS — K0530 Chronic periodontitis, unspecified: Secondary | ICD-10-CM

## 2021-06-18 DIAGNOSIS — K06020 Generalized gingival recession, unspecified: Secondary | ICD-10-CM

## 2021-06-18 DIAGNOSIS — C119 Malignant neoplasm of nasopharynx, unspecified: Secondary | ICD-10-CM

## 2021-06-18 NOTE — Patient Instructions (Signed)
[facility] Keijuan Schellhase B. Jnya Brossard, D.M.D. Phone: [phone] Fax: [phone]   It was a pleasure seeing you today!  Please refer to the information below regarding your dental visit with us.  Call if you have any questions or concerns that come up after you leave.   Thank you for letting us provide care for you.  If there is anything we can do for you, please let us know.    RADIATION THERAPY AND INFORMATION REGARDING YOUR TEETH   XEROSTOMIA (DRY MOUTH):  Your salivary glands may be in the field of radiation.  Radiation may include all or only part of your salivary glands.  This will cause your saliva to dry up, and you will have a dry mouth.  The dry mouth will be for the rest of your life unless your radiation oncologist tells you otherwise.  Your saliva has many functions: It wets your tongue for speaking. It coats your teeth and the inside of your mouth for easier movement. It helps with chewing and swallowing food. It helps clean away harmful acid and toxic products made by the germs in your mouth, therefore it helps prevent cavities. It kills some germs in your mouth and helps to prevent gum disease. It helps to carry flavor to your taste buds.  Once you have lost your saliva, you will be at higher risk for tooth decay and gum disease.    What can be done to help improve your mouth when there's not enough saliva? Your dentist may give a recommendation for CLoSYS.  It will not bring back all of your saliva but may bring back some of it.  Also, your saliva may be thick and ropy or white and foamy.  It will not feel like it use to feel. You will need to swish with water every time your mouth feels dry.  YOU CANNOT suck on any cough drops, mints, lemon drops, candy, vitamin C or any other products.  You cannot use anything other than water to make your mouth feel less dry.  If you want to drink anything else, you have to drink it all at once and brush  afterwards.  Be sure to discuss the details of your diet habits with your dentist or hygienist.   RADIATION CARIES:  This is decay (cavities) that happens very quickly once your mouth is very dry due to radiation therapy.  Normally, cavities take six months to two years to become a problem.  When you have dry mouth, cavities may take as little as eight weeks to cause you a problem.    Dental check-ups every two months are necessary as long as you have a dry mouth. Radiation caries typically, but not always, start at your gum line where it is hard to see the cavity.  It is therefore also hard to fill these cavities adequately.  This high rate of cavities happens because your mouth no longer has saliva and therefore the acid made by the germs starts the decay process.  Whenever you eat anything the germs in your mouth change the food into acid.  The acid then burns a small hole in your tooth.  This small hole is the beginning of a cavity.  If this is not treated then it will grow bigger and become a cavity.  The way to avoid this hole getting bigger is to use fluoride every evening as prescribed by your dentist following your radiation. NOTE:  You have to make sure   that your teeth are very clean before you use the fluoride.  This fluoride in turn will strengthen your teeth and prepare them for another day of fighting acid. If you develop radiation caries many times, the damage is so large that you will have to have all your teeth removed.  This could be a big problem if some of these teeth are in the field of radiation.  Further details of why this could be a big problem will follow (see Osteoradionecrosis below).   DYSGEUSIA (LOSS OF TASTE):  This happens to varying degrees once you've had radiation therapy to your jaw region.  Many times taste is not completely lost, but becomes limited.  The loss of taste is mostly due to radiation affecting your taste buds.  However, if you have no saliva in your mouth  to carry the flavor to your taste buds, it would be difficult for your taste buds to taste anything.  That is why using water or a prescription for Salagen prior to meals and during meal times may help with some of the taste.  Keep in mind that taste generally returns very slowly over the course of several months or several years after radiation therapy.  Don't give up hope.   TRISMUS (LIMITED JAW OPENING):  According to your radiation oncologist, your TMJ or jaw joints are going to be partially or fully in the field of radiation.  This means that over time the muscles that help you open and close your mouth may get stiff.  This will potentially result in your not being able to open your mouth wide enough or as wide as you can open it now.    Let me give you an example of how slowly this happens and how unaware people are of it:   A gentlemen that had radiation therapy two years ago came back to me complaining that bananas are just too large for him to be able to fit them in between his teeth.  He was not able to open wide enough to bite into a banana.  This happens slowly and over a period of time.  What we do to try and prevent this:   Your dentist will probably give you a stack of sticks called a trismus exercise device.  This stack will help remind your muscles and your jaw joints to open up to the same distance every day.  Use these sticks every morning when you wake up, or according to the instructions given by your dentist.    You must use these sticks for at least one to two years after radiation therapy.  The reason for that is because it happens so slowly and keeps going on for about two years after radiation therapy.  Your hospital dentist will help you monitor your mouth opening and make sure that it's not getting smaller after radiation.  TRISMUS EXERCISES: Using the stack of sticks given to you by your dentist, place the stack in your mouth and hold onto the other end for support. Leave  the sticks in your mouth while holding the other end.  Allow 30 seconds for muscle stretching. Rest for a few seconds. Repeat 3-5 times. This exercise is recommended in the mornings and evenings unless otherwise instructed. The exercise should be done for a period of 2 YEARS after the end of radiation. Your maximum jaw opening should be checked regularly at recall dental visits by your general dentist. You should report any changes, soreness, or difficulties encountered   when doing the exercises to your dentist.   OSTEORADIONECROSIS (ORN):  This is a condition where your jaw bone after radiation therapy becomes very dry.  It has very little blood supply to keep it alive.  If you develop a cavity that turns into an abscess or an infection, then the jaw bone does not have enough blood supply to help fight the infection.  At this point it is very likely that the infection could cause the death of your jaw bone.  When you have dead bone it has to be removed.  Therefore, you might end up having to have surgery to remove part of your jaw bone, the part of the jaw bone that has been affected.     Healing is also a problem if you are to have surgery (like a tooth extraction) in the areas where the bone has had radiation therapy.  If you have surgery, you need more blood supply to heal which is not available.  When blood supply and oxygen are not available, there is a chance for the bone to die. Occasionally, ORN happens on its own with no obvious reason, but this is quite rare.  We believe that patients who continue to smoke and/or drink alcohol have a higher chance of having this problem. Once your jaw bone has had radiation therapy, if there are any remaining teeth in that area, it is not recommended to have them pulled unless your dentist or oral surgeon is aware of your history of radiation and believes it is safe.  The risks for ORN either from infection or spontaneously occurring (with no reason) are life  long.   QUESTIONS? Call our office during office hours at [phone].  

## 2021-06-18 NOTE — Progress Notes (Signed)
Department of Dental Medicine          OUTPATIENT CONSULT   Service Date:   06/18/2021  Patient Name:   Dustin [name] Date of Birth:   24-Jun-1961 Medical Record Number: [id number]  Referring Provider:                Eppie Gibson, M.D.  >  Plan/Recommendations <   Assessment There are no current signs of acute odontogenic infection including abscess, edema or erythema, or suspicious lesion requiring biopsy.   There are teeth with cavities and heavy calculus build-up on remaining dentition causing gingival inflammation.  Recommendations Debridement of remaining dentition.   Establish dental care at a dental office of the patient's choice for routine care including cleanings/periodontal therapy and periodic exams following radiation therapy.  Plan Discuss case with medical team and coordinate treatment as needed.  Final treatment plan/recommendations TBD pending Dr. Pearlie Oyster visit with the patient on 10/5. Return for debridement of teeth and then follow-up after radiation therapy.  Discussed in detail all treatment options and recommendations with the patient and they are agreeable to the plan.    Thank you for consulting with [facility] and for the opportunity to participate in this patient's treatment.  Should you have any questions or concerns, please contact the [facility] at [phone].    06/18/2021 Consult Note:  COVID-19 SCREENING:  The patient denies symptoms concerning for COVID-19 infection including fever, chills, cough, or newly developed shortness of breath.   HISTORY OF PRESENT ILLNESS: Dustin [name] is a very pleasant 60 y.o. male with h/o hyperlipidemia, GERD, type 2 diabetes mellitus, depression with anxiety, COPD and tobacco use who was recently diagnosed with nasopharyngeal cancer and is anticipating head and neck radiation.  The patient presents today for a medically necessary dental consultation as part of their pre-radiation  therapy work-up.   DENTAL HISTORY: The patient reports that it has been several years since he has last seen a dentist.  He states that the remaining teeth he has on the bottom are in bad shape.  He does have dental insurance and said about 5 years he had a complete upper denture made, but he did not like them esthetically because the teeth were too small. He said that this was also when he and his wife both had dental insurance so routine care and his dentures were covered, but now he has only his insurance which has also been a reason that he has not established care at another office since then.  He currently denies any dental/orofacial pain or sensitivity. Patient is able to manage oral secretions.  Patient denies dysphagia, odynophagia, dysphonia, SOB and neck pain.  Patient denies fever, rigors and malaise.   CHIEF COMPLAINT:  Here for a pre-head and neck radiation dental exam.   Patient Active Problem List   Diagnosis Date Noted  . 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 Chest pain of uncertain etiology   . Former smoker 04/18/2017  . Primary insomnia 04/14/2017  . 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 History of positive PPD treated with INH  04/14/2017  . Abscess of left olecranon bursa 11/07/2016  . High risk medication use 11/07/2016  . Cervical disc disease 11/07/2016  . Primary osteoarthritis of both hands 11/07/2016  . Primary osteoarthritis of both knees 11/07/2016  . Primary osteoarthritis of both shoulders 11/07/2016  . Precordial pain 01/22/2016  . Pain in the chest   . Hyperlipidemia 01/20/2016  .  Depression with anxiety 01/20/2016  . GERD (gastroesophageal reflux disease) 01/20/2016  . Type 2 diabetes mellitus ([place]) 01/20/2016  . Chest pain at rest 09/22/2014  . Tobacco use 09/22/2014  . Morbid obesity ([place]) 09/22/2014  . Rheumatoid arthritis ([place]) 09/22/2014  . Family history of heart disease 09/22/2014  . Chest pain 09/22/2014   Past Medical  History:  Diagnosis Date  . Anxiety   . COPD (chronic obstructive pulmonary disease) ([place])   . Depression   . Essential hypertension   . GERD (gastroesophageal reflux disease)   . H/O hiatal hernia   . History of cardiac catheterization    Normal coronaries May 2017  . History of ITP    Childhood  . Hypothyroidism   . Osteoarthritis   . Type 2 diabetes mellitus ([place])    no meds   Past Surgical History:  Procedure Laterality Date  . ANTERIOR CERVICAL DECOMP/DISCECTOMY FUSION  01/21/2012   Procedure: ANTERIOR CERVICAL DECOMPRESSION/DISCECTOMY FUSION 2 LEVELS;  Surgeon: Erline Levine, MD;  Location: [place] [facility] ORS;  Service: Neurosurgery;  Laterality: N/A;  Cervical Six-Seven, Cervical Seven-Thoracic One, Anterior Cervical Decompression with Fusion Interbody Prothesis Plating and Bonegraft possible posterior Cervical Seven-Thoracic One Foraminotomy  . CARDIAC CATHETERIZATION  [phone]   normal coronary arteries  . CARDIAC CATHETERIZATION N/A 01/22/2016   Procedure: Left Heart Cath and Coronary Angiography;  Surgeon: Burnell Blanks, MD;  Location: [place] CV LAB;  Service: Cardiovascular;  Laterality: N/A;  . CARPAL TUNNEL RELEASE Bilateral   . CERVICAL DISC SURGERY  04  . CHOLECYSTECTOMY N/A 10/26/2014   Procedure: LAPAROSCOPIC CHOLECYSTECTOMY;  Surgeon: Jamesetta So, MD;  Location: [facility];  Service: General;  Laterality: N/A;  . ESOPHAGOGASTRODUODENOSCOPY N/A 05/03/2016   Procedure: ESOPHAGOGASTRODUODENOSCOPY (EGD);  Surgeon: Rogene Houston, MD;  Location: [facility];  Service: Endoscopy;  Laterality: N/A;  10:30  . HERNIA REPAIR Right 60's  . Left elbow bursa removed    . NASOPHARYNGOSCOPY N/A 06/04/2021   Procedure: NASAL ENDOSCOPY WITH BIOPSY OF NASOPHARYNX; FROZEN SECTION;  Surgeon: Izora Gala, MD;  Location: [facility];  Service: ENT;  Laterality: N/A;  . TONSILLECTOMY  72   No Known Allergies Current Outpatient Medications  Medication Sig Dispense Refill  .  acetaminophen (TYLENOL) 500 MG tablet Take 500 mg by mouth every 6 (six) hours as needed for mild pain.    Marland Kitchen ALPRAZolam (XANAX) 1 MG tablet Take 1 mg by mouth 3 (three) times daily as needed for anxiety.    Marland Kitchen atorvastatin (LIPITOR) 10 MG tablet Take 10 mg by mouth at bedtime.    . Cholecalciferol (DIALYVITE VITAMIN D 5000 PO) Take 1 capsule by mouth daily.    . folic acid (FOLVITE) 1 MG tablet TAKE 1 TABLET BY MOUTH TWICE DAILY 180 tablet 2  . ketorolac (TORADOL) 10 MG tablet Take 10 mg by mouth 4 (four) times daily as needed. (Patient not taking: No sig reported)    . levothyroxine (SYNTHROID) 50 MCG tablet Take 50 mcg by mouth at bedtime.    Marland Kitchen lisinopril (ZESTRIL) 2.5 MG tablet Take by mouth.    . meloxicam (MOBIC) 7.5 MG tablet Take 7.5 mg by mouth 2 (two) times daily as needed. (Patient not taking: No sig reported)    . metFORMIN (GLUCOPHAGE) 500 MG tablet Take 500 mg by mouth 2 (two) times daily with a meal.    . methotrexate (RHEUMATREX) 2.5 MG tablet TAKE EIGHT TABLETS BY MOUTH ONCE WEEKLY (Patient taking differently: TAKE EIGHT TABLETS BY  MOUTH ONCE WEEKLY on Sat) 96 tablet 0  . pantoprazole (PROTONIX) 40 MG tablet Take 40 mg by mouth at bedtime.    . sildenafil (VIAGRA) 25 MG tablet Take 25 mg by mouth daily as needed for erectile dysfunction.    . traMADol (ULTRAM) 50 MG tablet Take 1 tablet (50 mg total) by mouth every 6 (six) hours as needed. Do not drive while taking tramadol 30 tablet 0  . zolpidem (AMBIEN) 10 MG tablet Take 10 mg by mouth at bedtime as needed for sleep. Reported on 01/20/2016     No current facility-administered medications for this visit.    LABS: Lab Results  Component Value Date   WBC 7.8 05/24/2021   HGB 14.1 05/24/2021   HCT 43.0 05/24/2021   MCV 100.0 05/24/2021   PLT 234 05/24/2021      Component Value Date/Time   NA 139 05/24/2021 0953   NA 143 01/12/2021 1113   K 4.7 05/24/2021 0953   CL 103 05/24/2021 0953   CO2 31 05/24/2021 0953   GLUCOSE  145 (H) 05/24/2021 0953   BUN 18 05/24/2021 0953   BUN 22 01/12/2021 1113   CREATININE 0.86 05/24/2021 0953   CREATININE 1.00 04/18/2017 1056   CALCIUM 9.0 05/24/2021 0953   GFRNONAA >60 05/24/2021 0953   GFRNONAA 83 04/18/2017 1056   GFRAA 108 09/29/2020 1004   GFRAA >89 04/18/2017 1056   Lab Results  Component Value Date   INR 1.11 01/22/2016   No results found for: PTT  Social History   Socioeconomic History  . Marital status: Married    Spouse name: Not on file  . Number of children: Not on file  . Years of education: Not on file  . Highest education level: Not on file  Occupational History  . Not on file  Tobacco Use  . Smoking status: Every Day    Packs/day: 1.00    Years: 30.00    Pack years: 30.00    Types: Cigarettes    Start date: 03/13/1974    Last attempt to quit: 09/16/2017    Years since quitting: 3.7  . Smokeless tobacco: Never  Vaping Use  . Vaping Use: Never used  Substance and Sexual Activity  . Alcohol use: No    Alcohol/week: 0.0 standard drinks  . Drug use: Never  . Sexual activity: Yes    Partners: Female  Other Topics Concern  . Not on file  Social History Narrative  . Not on file   Social Determinants of Health   Financial Resource Strain: Not on file  Food Insecurity: Not on file  Transportation Needs: Not on file  Physical Activity: Not on file  Stress: Not on file  Social Connections: Not on file  Intimate Partner Violence: Not on file   Family History  Problem Relation Age of Onset  . Heart disease Mother   . COPD Mother   . Heart attack Father        Sudden death  . Heart attack Sister 18  . Kidney disease Sister   . Heart disease Sister   . Heart attack Brother   . Heart disease Brother   . Diabetes Brother   . Heart attack Brother   . Heart disease Brother 49       CABG  . Heart attack Brother 89     REVIEW OF SYSTEMS:  Reviewed with the patient as per HPI. Psych: (+) Dental phobia   VITAL SIGNS: BP  125/80 (BP  Location: Right Arm, Patient Position: Sitting, Cuff Size: Normal)   Pulse 78   Temp 98.2 F (36.8 C) (Oral)    PHYSICAL EXAM: General:  Well-developed, comfortable and in no apparent distress. Neurological:  Alert and oriented to person, place and  time. Extraoral:  Facial symmetry present without any edema or erythema.  No swelling.  TMJ asymptomatic without clicks or crepitations. Intraoral:  Soft tissues appear well-perfused and mucous membranes moist.  FOM and vestibules soft and not raised. Oral cavity without mass or lesion. No signs of infection, parulis, sinus tract, edema or erythema evident upon exam.     DENTAL EXAM: Hard tissue exam completed and charted. Overall impression:  Fair remaining dentition. Oral hygiene:  Poor   Periodontal:  Inflamed and erythematous gingival tissue.  Heavy, generalized calculus accumulation.  Generalized gingival recession.  (+) Mobility:  Class II on #24 and #25 Caries:  #18O, #31O Removable/fixed prosthodontics:  Patient denies wearing partial or complete dentures. Occlusion:  Unable to assess molar occlusion.   (+) Non-functional teeth:  Remaining teeth are non-functional d/t no opposing dentition. (+) Supra-erupted teeth: #18, #20 and #31 Other findings:   (+) Attrition/wear: #22-#27 incisal   RADIOGRAPHIC EXAM:  PAN and 7 periapical images exposed and interpreted.  Condyles seated bilaterally in fossas.  No evidence of abnormal pathology.  All visualized osseous structures appear WNL. #18, #20 and #31 appear supra-erupted; #18 is inclined mesially.  Generalized moderate horizontal bone loss consistent with moderate periodontitis. Radiographic calculus accumulation evident.  Missing teeth, existing restorations on teeth numbers 18, 21 and 28.     ASSESSMENT:  1.  Nasopharyngeal cancer 2.  Pre-head and neck radiation dental exam 3.  Missing teeth 4.  Caries 5.  Accretions on teeth 6.  Chronic periodontitis 7.   Attrition/wear 8.  Gingival recession, generalized 9.  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  The discussion included side effects of trismus (limited opening), dysgeusia (loss of taste), xerostomia (dry mouth), radiation caries and osteoradionecrosis of the jaw.  I also discussed the importance of maintaining optimal oral hygiene and oral health before, during and after radiation to decrease the risk of developing radiation cavities and the need for any surgery such as extractions after therapy.     PLAN AND RECOMMENDATIONS: I discussed the risks, benefits, and complications of various scenarios with the patient in relationship to their medical and dental conditions, which included systemic infection or other serious issues such as osteoradionecrosis that could potentially occur either before, during or after their anticipated radiation therapy if dental/oral concerns are not addressed.  I explained that if any chronic or acute dental/oral infection(s) are addressed and subsequently not maintained following medical optimization and recovery, their risk of the previously mentioned complications are just as high and could potentially occur postoperatively.  I explained all significant findings of the dental consultation with the patient including a few teeth with cavities and heavy calculus or tartar build-up on his remaining teeth which is causing bone loss and chronic inflammation/infection of his gums, and the recommended care including a full mouth debridement of his remaining dentition in order to optimize them for radiation from a dental standpoint.  The patient verbalized understanding of all findings, discussion, and recommendations.  We then discussed various treatment options to include no treatment, multiple extractions with alveoloplasty, pre-prosthetic  surgery as indicated, periodontal  therapy, dental restorations, root canal therapy, crown and bridge therapy, implant therapy, and replacement of missing teeth as indicated.  I also explained that the current recommended/indicated plan is tentative and is dependent on his appointment with Dr. Isidore Moos on Wednesday to see where/how much radiation he will be getting to any of his teeth roots and/or jawbones.  For right now, we will schedule him for a debridement and we can discuss any additional indicated treatment or plan to follow-up with him after radiation therapy at that time.  The patient verbalized understanding of all options, and currently wishes to proceed with full mouth debridement as recommended. Plan to discuss all findings and recommendations with medical team and coordinate future care as needed.  The patient will need to establish care at a dental office of his choice for routine dental care including replacement of missing teeth as needed, cleanings and exams.  All questions and concerns were invited and addressed.  The patient tolerated today's visit well and departed in stable condition.  I spent in excess of 120 minutes during the conduct of this consultation and >50% of this time involved direct face-to-face encounter for counseling and/or coordination of the patient's care.  Pray Benson Norway, D.M.D.

## 2021-06-19 ENCOUNTER — Encounter (HOSPITAL_COMMUNITY)
Admission: RE | Admit: 2021-06-19 | Discharge: 2021-06-19 | Disposition: A | Discharge status: Home / Self Care | Payer: BC Managed Care – PPO | Source: Ambulatory Visit | Attending: Radiation Oncology | Admitting: Radiation Oncology

## 2021-06-19 ENCOUNTER — Ambulatory Visit (HOSPITAL_COMMUNITY)
Admission: RE | Admit: 2021-06-19 | Discharge: 2021-06-19 | Disposition: A | Discharge status: Home / Self Care | Payer: BC Managed Care – PPO | Source: Ambulatory Visit | Attending: Radiation Oncology | Admitting: Radiation Oncology

## 2021-06-19 DIAGNOSIS — J432 Centrilobular emphysema: Secondary | ICD-10-CM | POA: Diagnosis not present

## 2021-06-19 DIAGNOSIS — N200 Calculus of kidney: Secondary | ICD-10-CM | POA: Diagnosis not present

## 2021-06-19 DIAGNOSIS — C119 Malignant neoplasm of nasopharynx, unspecified: Secondary | ICD-10-CM | POA: Diagnosis not present

## 2021-06-19 DIAGNOSIS — D492 Neoplasm of unspecified behavior of bone, soft tissue, and skin: Secondary | ICD-10-CM | POA: Diagnosis not present

## 2021-06-19 DIAGNOSIS — R590 Localized enlarged lymph nodes: Secondary | ICD-10-CM | POA: Diagnosis not present

## 2021-06-19 DIAGNOSIS — C4492 Squamous cell carcinoma of skin, unspecified: Secondary | ICD-10-CM | POA: Diagnosis not present

## 2021-06-19 DIAGNOSIS — K5730 Diverticulosis of large intestine without perforation or abscess without bleeding: Secondary | ICD-10-CM | POA: Diagnosis not present

## 2021-06-19 DIAGNOSIS — J392 Other diseases of pharynx: Secondary | ICD-10-CM | POA: Diagnosis not present

## 2021-06-19 LAB — GLUCOSE, CAPILLARY: Glucose-Capillary: 143 mg/dL — ABNORMAL HIGH (ref 70–99)

## 2021-06-19 MED ORDER — FLUDEOXYGLUCOSE F - 18 (FDG) INJECTION
10.0000 | Freq: Once | INTRAVENOUS | Status: AC | PRN
  Administered 2021-06-19: 13.23 via INTRAVENOUS

## 2021-06-19 MED ORDER — GADOBUTROL 1 MMOL/ML IV SOLN
10.0000 mL | Freq: Once | INTRAVENOUS | Status: AC | PRN
  Administered 2021-06-19: 10 mL via INTRAVENOUS

## 2021-06-20 ENCOUNTER — Other Ambulatory Visit (HOSPITAL_BASED_OUTPATIENT_CLINIC_OR_DEPARTMENT_OTHER): Payer: Self-pay

## 2021-06-20 ENCOUNTER — Ambulatory Visit: Payer: BC Managed Care – PPO | Attending: Internal Medicine

## 2021-06-20 ENCOUNTER — Other Ambulatory Visit: Payer: Self-pay | Admitting: *Deleted

## 2021-06-20 ENCOUNTER — Encounter: Payer: Self-pay | Admitting: *Deleted

## 2021-06-20 ENCOUNTER — Other Ambulatory Visit: Payer: Self-pay

## 2021-06-20 ENCOUNTER — Ambulatory Visit
Admission: RE | Admit: 2021-06-20 | Discharge: 2021-06-20 | Disposition: A | Discharge status: Home / Self Care | Payer: BC Managed Care – PPO | Source: Ambulatory Visit | Attending: Radiation Oncology | Admitting: Radiation Oncology

## 2021-06-20 ENCOUNTER — Encounter: Payer: Self-pay | Admitting: Oncology

## 2021-06-20 ENCOUNTER — Inpatient Hospital Stay: Payer: BC Managed Care – PPO | Admitting: Nutrition

## 2021-06-20 ENCOUNTER — Ambulatory Visit: Payer: BC Managed Care – PPO

## 2021-06-20 ENCOUNTER — Encounter: Payer: Self-pay | Admitting: Radiation Oncology

## 2021-06-20 ENCOUNTER — Other Ambulatory Visit: Payer: Self-pay | Admitting: Nurse Practitioner

## 2021-06-20 ENCOUNTER — Inpatient Hospital Stay: Payer: BC Managed Care – PPO | Attending: Oncology

## 2021-06-20 ENCOUNTER — Inpatient Hospital Stay: Payer: BC Managed Care – PPO

## 2021-06-20 DIAGNOSIS — C118 Malignant neoplasm of overlapping sites of nasopharynx: Secondary | ICD-10-CM | POA: Diagnosis not present

## 2021-06-20 DIAGNOSIS — Z23 Encounter for immunization: Secondary | ICD-10-CM

## 2021-06-20 DIAGNOSIS — J449 Chronic obstructive pulmonary disease, unspecified: Secondary | ICD-10-CM | POA: Diagnosis not present

## 2021-06-20 DIAGNOSIS — C111 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: Secondary | ICD-10-CM

## 2021-06-20 DIAGNOSIS — M069 Rheumatoid arthritis, unspecified: Secondary | ICD-10-CM | POA: Diagnosis not present

## 2021-06-20 DIAGNOSIS — C119 Malignant neoplasm of nasopharynx, unspecified: Secondary | ICD-10-CM

## 2021-06-20 DIAGNOSIS — Z5111 Encounter for antineoplastic chemotherapy: Secondary | ICD-10-CM | POA: Diagnosis not present

## 2021-06-20 DIAGNOSIS — E039 Hypothyroidism, unspecified: Secondary | ICD-10-CM | POA: Insufficient documentation

## 2021-06-20 DIAGNOSIS — Z79899 Other long term (current) drug therapy: Secondary | ICD-10-CM | POA: Diagnosis not present

## 2021-06-20 DIAGNOSIS — E119 Type 2 diabetes mellitus without complications: Secondary | ICD-10-CM | POA: Diagnosis not present

## 2021-06-20 DIAGNOSIS — F17210 Nicotine dependence, cigarettes, uncomplicated: Secondary | ICD-10-CM | POA: Diagnosis not present

## 2021-06-20 LAB — CMP (CANCER CENTER ONLY)
ALT: 11 U/L (ref 0–44)
AST: 13 U/L — ABNORMAL LOW (ref 15–41)
Albumin: 4.5 g/dL (ref 3.5–5.0)
Alkaline Phosphatase: 48 U/L (ref 38–126)
Anion gap: 6 (ref 5–15)
BUN: 26 mg/dL — ABNORMAL HIGH (ref 8–23)
CO2: 31 mmol/L (ref 22–32)
Calcium: 10 mg/dL (ref 8.9–10.3)
Chloride: 102 mmol/L (ref 98–111)
Creatinine: 0.75 mg/dL (ref 0.61–1.24)
GFR, Estimated: >60 mL/min (ref >60)
Glucose, Bld: 188 mg/dL — ABNORMAL HIGH (ref 70–99)
Potassium: 4.7 mmol/L (ref 3.5–5.1)
Sodium: 139 mmol/L (ref 135–145)
Total Bilirubin: 1 mg/dL (ref 0.3–1.2)
Total Protein: 7.7 g/dL (ref 6.5–8.1)

## 2021-06-20 LAB — CBC WITH DIFFERENTIAL (CANCER CENTER ONLY)
Abs Immature Granulocytes: 0.02 10*3/uL (ref 0.00–0.07)
Basophils Absolute: 0.1 10*3/uL (ref 0.0–0.1)
Basophils Relative: 1 %
Eosinophils Absolute: 0.1 10*3/uL (ref 0.0–0.5)
Eosinophils Relative: 2 %
HCT: 45.1 % (ref 39.0–52.0)
Hemoglobin: 14.8 g/dL (ref 13.0–17.0)
Immature Granulocytes: 0 %
Lymphocytes Relative: 22 %
Lymphs Abs: 1.7 10*3/uL (ref 0.7–4.0)
MCH: 32.7 pg (ref 26.0–34.0)
MCHC: 32.8 g/dL (ref 30.0–36.0)
MCV: 99.6 fL (ref 80.0–100.0)
Monocytes Absolute: 0.6 10*3/uL (ref 0.1–1.0)
Monocytes Relative: 8 %
Neutro Abs: 5 10*3/uL (ref 1.7–7.7)
Neutrophils Relative %: 67 %
Platelet Count: 267 10*3/uL (ref 150–400)
RBC: 4.53 MIL/uL (ref 4.22–5.81)
RDW: 12.4 % (ref 11.5–15.5)
WBC Count: 7.5 10*3/uL (ref 4.0–10.5)
nRBC: 0 % (ref 0.0–0.2)

## 2021-06-20 LAB — MAGNESIUM: Magnesium: 1.9 mg/dL (ref 1.7–2.4)

## 2021-06-20 MED ORDER — LIDOCAINE-PRILOCAINE 2.5-2.5 % EX CREA: 1.0000 "application " | TOPICAL_CREAM | CUTANEOUS | 0 refills | Status: DC | PRN

## 2021-06-20 MED ORDER — BUPROPION HCL ER (SR) 150 MG PO TB12: ORAL_TABLET | ORAL | 2 refills | Status: DC

## 2021-06-20 MED ORDER — HYDROCODONE-ACETAMINOPHEN 7.5-325 MG/15ML PO SOLN: 10.0000 mL | Freq: Four times a day (QID) | ORAL | 0 refills | Status: DC | PRN

## 2021-06-20 MED ORDER — PROCHLORPERAZINE MALEATE 10 MG PO TABS
10.0000 mg | ORAL_TABLET | Freq: Four times a day (QID) | ORAL | 0 refills | Status: DC | PRN
Start: 2021-06-20 — End: 2021-08-01

## 2021-06-20 MED ORDER — ONDANSETRON HCL 8 MG PO TABS: 8.0000 mg | ORAL_TABLET | Freq: Three times a day (TID) | ORAL | 0 refills | Status: DC | PRN

## 2021-06-20 MED ORDER — NICOTINE 7 MG/24HR TD PT24: 7.0000 mg | MEDICATED_PATCH | Freq: Every day | TRANSDERMAL | 0 refills | Status: DC

## 2021-06-20 MED ORDER — MODERNA COVID-19 BIVAL BOOSTER 50 MCG/0.5ML IM SUSP
INTRAMUSCULAR | 0 refills | Status: DC
  Filled 2021-06-20: qty 0.5, 1d supply, fill #0

## 2021-06-20 MED ORDER — NICOTINE 21 MG/24HR TD PT24: 21.0000 mg | MEDICATED_PATCH | Freq: Every day | TRANSDERMAL | 2 refills | Status: DC

## 2021-06-20 MED ORDER — NICOTINE 14 MG/24HR TD PT24: 14.0000 mg | MEDICATED_PATCH | Freq: Every day | TRANSDERMAL | 0 refills | Status: DC

## 2021-06-20 NOTE — Progress Notes (Signed)
Met with patient today for Patient education and review of upcoming treatment regimen. We discussed PAC placement and possible need of G tube, Dr Benay Spice will discuss with pt tomorrow at his appt. PAC will be placed next Wednesday, with treatment beginning on 10/17 due to family visiting

## 2021-06-20 NOTE — Progress Notes (Signed)
60 year old male diagnosed with nasopharyngeal cancer and followed by Dr. Benay Spice and Dr. Isidore Moos.  PMH includes Tobacco, Rheumatoid Arthritis, DM, COPD, Hypothyroid disease, ITP.  Medications include: Xanax, Vit D, Folvite, Synthroid, Glucophage, Protonix.  Labs reviewed.  Height: 6'1". Weight: 265 pounds 06-12-21 UBW: 278 Pounds.05-24-21 BMI: 34.96  Patient reports he is just getting started with appointments for newly diagnosed nasopharyngeal cancer. He endorsed recent 13 pound wt loss.  Nutrition Diagnosis: Predicted sub optimal energy intake related to new cancer dx as evidenced by history or condition for which research shows inadequate oral intake.  Intervention: Educated to increase calories and protein in small frequent meals and snacks throughout the day. Minimize wt loss. Consider high calorie, high protein nutrition supplements. Brief review of nutrition impact symptoms with diet strategies. Provided nutrition fact sheets. Questions answered and teach back method used. Contact information provided.  Monitoring, Evaluation, Goals: Patient will tolerate increased calories and protein to minimize wt loss.  Next Visit: To be scheduled weekly with treatment.

## 2021-06-20 NOTE — Progress Notes (Signed)
   Covid-19 Vaccination Clinic  Name:  Dustin [name]    MRN: [id number] DOB: 1961-08-11  06/20/2021  Mr. Car was observed post Covid-19 immunization for 15 minutes without incident. He was provided with Vaccine Information Sheet and instruction to access the V-Safe system.   [name] was instructed to call 911 with any severe reactions post vaccine: Difficulty breathing  Swelling of face and throat  A fast heartbeat  A bad rash all over body  Dizziness and weakness

## 2021-06-20 NOTE — Progress Notes (Signed)
Head and Neck Cancer Location of Tumor / Histology:  Squamous cell carcinoma of nasopharynx, EBV (-)  Patient presented with symptoms of: noticed a lump in his left neck about 6 or 7 weeks ago. He was treated with antibiotics without any effect. He has had some headache for about 6 months which is unusual for him.  PET Scan 06/19/2021 --IMPRESSION: 1. Substantially reduced size of the posterior nasopharyngeal mass. Current maximum SUV is 12.5, compatible with malignancy. There still adjacent sclerosis and irregularity in the clivus probably from some degree of local invasion. 2. Bilateral single level IIa lymph nodes are mildly enlarged and hypermetabolic as detailed above, compatible with malignant involvement. 3. No findings of metastatic disease to the chest, abdomen, or pelvis. 4. Other imaging findings of potential clinical significance: Aortic Atherosclerosis (ICD10-I70.0). Mild mitral valve calcifications. Emphysema (ICD10-J43.9). Nonobstructive right nephrolithiasis. Sigmoid colon diverticulosis.  MRI Face Trigeminal w/ & w/o Contrast 06/19/2021 IMPRESSION: 1. Extensive tumor invasion of the skull base, throughout an area of roughly 4 cm in the midline including the entire clivus, posterior sphenoid sinuses, and the floor of the bony sella. 2. But no Trigeminal or other distinct cranial nerve involvement is identified. And no intracranial extension is evident. 3. Primary bulky, roughly 2 cm thick bilateral nasopharyngeal soft tissue mass and visible abnormal bilateral level 2 nodes are stable from the CT last month.   Biopsies revealed:  06/04/2021 FINAL MICROSCOPIC DIAGNOSIS:  A. NASOPHARYNGEAL MASS, EXCISION:  - Squamous cell carcinoma ADDENDUM:  In situ hybridization for Epstein-Barr virus (EBV) is negative.  Nutrition Status Yes No Comments  Weight changes? []  [x]    Swallowing concerns? []  [x]  Does report occasionally food feels like it gets "hung-up" in his throat, but state  this happens rarely  PEG? []  [x]     Referrals Yes No Comments  Social Work? [x]  []    Dentistry? [x]  []  Saw Dr. Sandi Mariscal on 06/18/2021  Swallowing therapy? [x]  []    Nutrition? [x]  []    Med/Onc? [x]  []  Dr. [name]   Safety Issues Yes No Comments  Prior radiation? []  [x]    Pacemaker/ICD? []  [x]    Possible current pregnancy? []  [x]  N/A  Is the patient on methotrexate? [x]  []  20 mg PO weekly; last dose 06/09/2021   Tobacco/Marijuana/Snuff/ETOH use: Patient is a current every day smoker (smokes about 1 pack/day). Denies any smokeless tobacco use, alcohol consumption, or recreational drug use.   Past/Anticipated interventions by otolaryngology, if any:  06/04/2021 Dr. Izora Gala --[place] --[place]  Past/Anticipated interventions by medical oncology, if any:  Under care of Dr. [name] 06/12/2021 Disposition: Mr. Kabel is been diagnosed with squamous cell carcinoma of the nasopharynx.  I discussed treatment options with him.   He has been scheduled for a staging PET scan and MRI of the face.  He appears to have clinical stage III (T3N2) disease.  If this is confirmed on the staging studies I will recommend induction chemotherapy with gemcitabine and cisplatin. We reviewed potential toxicities associated with the gemcitabine and cisplatin regimen including the chance of nausea/vomiting, alopecia, hematologic toxicity, infection, and bleeding.  We discussed the nephrotoxicity, ototoxicity, and neuropathy associated with cisplatin.  We reviewed the fever, rash, and pneumonitis seen with gemcitabine.  He agrees to proceed. Mr. Tumolo will return for an office visit and further discussion after the restaging studies next week.  He will be referred for Port-A-Cath placement.   The plan is to begin gemcitabine/cisplatin during the week of 06/25/2021.  A chemotherapy plan was entered today.   Current Complaints / other details:  Nothing  else of note

## 2021-06-20 NOTE — Progress Notes (Signed)
Order for Nutrition consult entered

## 2021-06-20 NOTE — Progress Notes (Signed)
Radiation Oncology         [phone] ________________________________  Initial Outpatient Consultation by telemedicine to maximize safety during the pandemic.  MyChart video was used   Name: ARON INGE MRN: [id number]  Date: 06/20/2021  DOB: 03/18/1961  CC:[employer], MD  Izora Gala, MD   REFERRING PHYSICIAN: Izora Gala, MD  DIAGNOSIS: C11.1   ICD-10-CM   1. 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HCC)  C11.9 nicotine (NICODERM CQ - DOSED IN MG/24 HOURS) 21 mg/24hr patch    nicotine (NICODERM CQ - DOSED IN MG/24 HOURS) 14 mg/24hr patch    nicotine (NICODERM CQ - DOSED IN MG/24 HR) 7 mg/24hr patch    buPROPion (WELLBUTRIN SR) 150 MG 12 hr tablet    3. 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 male who presented with a lump in his left neck, first noticed this past July/August, to Dr. Gerarda Fraction. He was prescribed antibiotics at that time without any effect. Of note: MRI of cervical spine performed on 04/27/21 (ordered due to neck pain following surgery 3 months prior) revealed bilateral upper jugular lymphadenopathy.   Soft tissue neck CT performed on 05/25/21 revealed an aggressive nasopharyngeal tumor with bilateral lymphadenopathy and skull base erosion.  Subsequently, the patient saw Dr. Constance Holster on 06/01/21 who performed fiberoptic nasal endoscopy. Endoscopy revealed a nasopharyngeal mass defined by irregular fullness left greater than right in the nasopharynx, covered by mucosa involving the superior nasopharynx. Dr. Constance Holster noted these findings accompanied by enlarged lymph nodes in bilateral neck as very suspicious for nasopharyngeal carcinoma. Patient was recommended to undergo biopsy.  Biopsy of the  nasopharyngeal mass on 06/04/21 revealed: squamous cell carcinoma, EBV negative  Pertinent imaging thus far includes  (I personally reviewed his imaging at tumor board today) --PET performed on 06/19/21 revealing Substantially reduced size of the posterior nasopharyngeal mass. Current maximum SUV is 12.5, compatible with malignancy. There still adjacent sclerosis and irregularity in the clivus probably from some degree of local invasion. Bilateral single level IIa lymph nodes are mildly enlarged and hypermetabolic as detailed above, compatible with malignant involvement.. No findings of metastatic disease to the chest, abdomen, or pelvis.Marland Kitchen    --Trigeminal MRI on 06/19/21 revealing extensive tumor invasion of the skull base, throughout an area of roughly 4 cm in the midline including the entire clivus, posterior sphenoid sinuses, and the floor of the bony sella. No trigeminal or other distinct cranial nerve involvement was otherwise seen. MRI also again revealed the the primary bulky nasopharyngeal soft tissue mass measuring roughly 2 cm, as well as the visible abnormal bilateral level 2 nodes; both appeared stable since imaging last month.  Patient met with Dr. Learta Codding on 06/12/21 to discuss treatment options. The patient opted to proceed with treatment consisting of  induction gemcitabine and cisplatin. The patient is planned to begin treatment during the week of 06/25/2021.  Swallowing issues, if any: denies any trouble swallowing or sore throat  Weight Changes: none  Other symptoms: Ongoing headache for about 6 months reported to Dr. Constance Holster on 06/01/21, food getting stuck in the back of his throat intermittently for the past 6 to 8 months, chronic cough related to smoking  Tobacco history, if any: chronic smoker >>  1ppd  ETOH abuse, if any: quit drinking years ago  Prior cancers, if any: none  He is currently taking MTX.  PREVIOUS RADIATION THERAPY: No  PAST MEDICAL HISTORY:  has a past  medical history of Anxiety, COPD (chronic obstructive pulmonary disease) ([place]), Depression, Essential hypertension, GERD (gastroesophageal reflux disease), H/O hiatal hernia, History of cardiac catheterization, History of ITP, Hypothyroidism, Osteoarthritis, and Type 2 diabetes mellitus ([place]).    PAST SURGICAL HISTORY: Past Surgical History:  Procedure Laterality Date   ANTERIOR CERVICAL DECOMP/DISCECTOMY FUSION  01/21/2012   Procedure: ANTERIOR CERVICAL DECOMPRESSION/DISCECTOMY FUSION 2 LEVELS;  Surgeon: Erline Levine, MD;  Location: [facility];  Service: Neurosurgery;  Laterality: N/A;  Cervical Six-Seven, Cervical Seven-Thoracic One, Anterior Cervical Decompression with Fusion Interbody Prothesis Plating and Bonegraft possible posterior Cervical Seven-Thoracic One Foraminotomy   CARDIAC CATHETERIZATION  [phone]   normal coronary arteries   CARDIAC CATHETERIZATION N/A 01/22/2016   Procedure: Left Heart Cath and Coronary Angiography;  Surgeon: Burnell Blanks, MD;  Location: [facility];  Service: Cardiovascular;  Laterality: N/A;   CARPAL TUNNEL RELEASE Bilateral    CERVICAL DISC SURGERY  04   CHOLECYSTECTOMY N/A 10/26/2014   Procedure: LAPAROSCOPIC CHOLECYSTECTOMY;  Surgeon: Jamesetta So, MD;  Location: [facility];  Service: General;  Laterality: N/A;   ESOPHAGOGASTRODUODENOSCOPY N/A 05/03/2016   Procedure: ESOPHAGOGASTRODUODENOSCOPY (EGD);  Surgeon: Rogene Houston, MD;  Location: [facility];  Service: Endoscopy;  Laterality: N/A;  10:30   HERNIA REPAIR Right 60's   Left elbow bursa removed     NASOPHARYNGOSCOPY N/A 06/04/2021   Procedure: NASAL ENDOSCOPY WITH BIOPSY OF NASOPHARYNX; FROZEN SECTION;  Surgeon: Izora Gala, MD;  Location: [facility];  Service: ENT;  Laterality: N/A;   TONSILLECTOMY  72    FAMILY HISTORY: family history includes COPD in his mother; Diabetes in his brother; Heart attack in his brother, brother, and father; Heart attack (age of onset: 36) in his  brother; Heart attack (age of onset: 72) in his sister; Heart disease in his brother, mother, and sister; Heart disease (age of onset: 51) in his brother; Kidney disease in his sister.  SOCIAL HISTORY:  reports that he has been smoking cigarettes. He started smoking about 47 years ago. He has a 30.00 pack-year smoking history. He has never used smokeless tobacco. He reports that he does not drink alcohol and does not use drugs.  ALLERGIES: Patient has no known allergies.  MEDICATIONS:  Current Outpatient Medications  Medication Sig Dispense Refill   buPROPion (WELLBUTRIN SR) 150 MG 12 hr tablet Start one week before quit date. Take 1 tab daily x 3 days, then 1 tab BID thereafter. 60 tablet 2   nicotine (NICODERM CQ - DOSED IN MG/24 HOURS) 14 mg/24hr patch Place 1 patch (14 mg total) onto the skin daily. Apply 21 mg patch daily x 6 wk, then $Remove'14mg'NPXIoJb$  patch daily x 2 wk, then 7 mg patch daily x 2 wk 14 patch 0   nicotine (NICODERM CQ - DOSED IN MG/24 HOURS) 21 mg/24hr patch Place 1 patch (21 mg total) onto the skin daily. Apply 21 mg patch daily x 6 wk, then $Remove'14mg'VnFQWCm$  patch daily x 2 wk, then 7 mg patch daily x 2 wk 14 patch 2   nicotine (NICODERM CQ - DOSED IN MG/24 HR) 7 mg/24hr patch Place 1 patch (7 mg total) onto the skin daily. Apply 21 mg patch daily x 6 wk, then $Remove'14mg'OOFOfqU$  patch daily x 2 wk, then 7 mg patch  daily x 2 wk 14 patch 0   ALPRAZolam (XANAX) 1 MG tablet Take 1 mg by mouth 3 (three) times daily as needed for anxiety.     atorvastatin (LIPITOR) 10 MG tablet Take 10 mg by mouth at bedtime.     Cholecalciferol (DIALYVITE VITAMIN D 5000 PO) Take 1 capsule by mouth daily.     COVID-19 mRNA bivalent vaccine, Moderna, (MODERNA COVID-19 BIVAL BOOSTER) 50 MCG/0.5ML injection Inject into the muscle. 0.5 mL 0   folic acid (FOLVITE) 1 MG tablet TAKE 1 TABLET BY MOUTH TWICE DAILY 180 tablet 2   HYDROcodone-acetaminophen (HYCET) 7.5-325 mg/15 ml solution Take 10-15 mLs by mouth every 6 (six) hours as needed for  moderate pain. 240 mL 0   influenza vac split quadrivalent PF (FLUARIX) 0.5 ML injection Inject into the muscle. 0.5 mL 0   levothyroxine (SYNTHROID) 50 MCG tablet Take 50 mcg by mouth at bedtime.     lidocaine-prilocaine (EMLA) cream Apply 1 application topically as needed. (Patient not taking: Reported on 06/21/2021) 30 g 0   lisinopril (ZESTRIL) 2.5 MG tablet Take by mouth.     metFORMIN (GLUCOPHAGE) 500 MG tablet Take 500 mg by mouth 2 (two) times daily with a meal.     methotrexate (RHEUMATREX) 2.5 MG tablet TAKE EIGHT TABLETS BY MOUTH ONCE WEEKLY (Patient not taking: No sig reported) 96 tablet 0   ondansetron (ZOFRAN) 8 MG tablet Take 1 tablet (8 mg total) by mouth every 8 (eight) hours as needed for nausea or vomiting (As needed starting 3 days after Cisplatin chemo). 20 tablet 0   pantoprazole (PROTONIX) 40 MG tablet Take 40 mg by mouth at bedtime.     prochlorperazine (COMPAZINE) 10 MG tablet Take 1 tablet (10 mg total) by mouth every 6 (six) hours as needed for nausea or vomiting. 30 tablet 0   sildenafil (VIAGRA) 25 MG tablet Take 25 mg by mouth daily as needed for erectile dysfunction.     zolpidem (AMBIEN) 10 MG tablet Take 10 mg by mouth at bedtime as needed for sleep. Reported on 01/20/2016     No current facility-administered medications for this encounter.    REVIEW OF SYSTEMS:  Notable for that above.   PHYSICAL EXAM:  vitals were not taken for this visit.   General: Alert and oriented, in no acute distress HEENT:   Extraocular movements are intact.  Psychiatric: Judgment and insight are intact. Affect is appropriate.     LABORATORY DATA:  Lab Results  Component Value Date   WBC 7.5 06/20/2021   HGB 14.8 06/20/2021   HCT 45.1 06/20/2021   MCV 99.6 06/20/2021   PLT 267 06/20/2021   CMP     Component Value Date/Time   NA 139 06/20/2021 1140   NA 143 01/12/2021 1113   K 4.7 06/20/2021 1140   CL 102 06/20/2021 1140   CO2 31 06/20/2021 1140   GLUCOSE 188 (H)  06/20/2021 1140   BUN 26 (H) 06/20/2021 1140   BUN 22 01/12/2021 1113   CREATININE 0.75 06/20/2021 1140   CREATININE 1.00 04/18/2017 1056   CALCIUM 10.0 06/20/2021 1140   PROT 7.7 06/20/2021 1140   PROT 7.0 01/12/2021 1113   ALBUMIN 4.5 06/20/2021 1140   ALBUMIN 4.3 01/12/2021 1113   AST 13 (L) 06/20/2021 1140   ALT 11 06/20/2021 1140   ALKPHOS 48 06/20/2021 1140   BILITOT 1.0 06/20/2021 1140   GFRNONAA >60 06/20/2021 1140   GFRNONAA 83 04/18/2017 1056   GFRAA 108  09/29/2020 1004   GFRAA >89 04/18/2017 1056      No results found for: TSH   RADIOGRAPHY: CT Soft Tissue Neck W Contrast  Result Date: 05/27/2021 CLINICAL DATA:  Neck lymphadenopathy EXAM: CT NECK WITH CONTRAST TECHNIQUE: Multidetector CT imaging of the neck was performed using the standard protocol following the bolus administration of intravenous contrast. CONTRAST:  62mL OMNIPAQUE IOHEXOL 350 MG/ML SOLN COMPARISON:  Preceding cervical MRI FINDINGS: Pharynx and larynx: Bilateral, left eccentric nasopharyngeal mass with indistinct margins and at least 4.6 cm span, eroding the skull base at the level of the clivus and inferior sphenoid sinus. Salivary glands: No inflammation, mass, or stone. Thyroid: Normal. Lymph nodes: Enlarged right jugulodigastric node measuring up to 16 mm in length. At least 2 enlarged lymph nodes in the left level 2 neck with mildly indistinct adjacent fat margination and measurement of up to 17 mm. Vascular: Negative. Limited intracranial: Negative. Visualized orbits: Negative Mastoids and visualized paranasal sinuses: Mild soft tissue density in the inferior left sphenoid sinus which could be mucosal or tumoral. Skeleton: Clivus erosion and sclerosis as noted above. No visible extension to the dura. Upper chest: Emphysema and airway thickening. These results will be called to the ordering clinician or representative by the Radiologist Assistant, and communication documented in the PACS or [employer].  IMPRESSION: Aggressive nasopharyngeal tumor with bilateral lymphadenopathy and skull base erosion. Suggest MR staging of the skull base. Electronically Signed   By: Jorje Guild M.D.   On: 05/27/2021 21:29   NM PET Image Initial (PI) Skull Base To Thigh  Result Date: 06/20/2021 CLINICAL DATA:  Initial treatment strategy for nasopharyngeal malignancy. EXAM: NUCLEAR MEDICINE PET SKULL BASE TO THIGH TECHNIQUE: 13.2 mCi F-18 FDG was injected intravenously. Full-ring PET imaging was performed from the skull base to thigh after the radiotracer. CT data was obtained and used for attenuation correction and anatomic localization. Fasting blood glucose: 143 mg/dl COMPARISON:  CT neck 05/25/2021 FINDINGS: Mediastinal blood pool activity: SUV max 3.2 Liver activity: SUV max 4.7 NECK: The posterior nasopharyngeal mass partially invading the clivus has a maximum SUV of 12.5, compatible with malignancy. This is fairly bilaterally symmetric at this time. The anterior-posterior thickness of this mass is 1.8 cm on image 9 of series 4, previously 2.5 cm on 05/25/2021. A right level IIa lymph node measuring 1.1 cm in short axis on image 21 series 4 has maximum SUV of 10.2. This previously measured 1.2 cm in short axis on 05/25/2021. A left level IIa lymph node measures 1.1 cm in short axis on image 23 series 4 with maximum SUV 7.4. This previously measured 1.1 cm in short axis on 05/25/2021. There some small left level IIb lymph nodes. A left level V lymph node measuring 0.4 cm in short axis on image 29 series 4 has a maximum SUV of 2.0. Incidental CT findings: none CHEST: No significant abnormal hypermetabolic activity in this region. Incidental CT findings: Mild atherosclerotic calcification of the aortic arch and branch vessels. Mild mitral valve calcification. Centrilobular emphysema. ABDOMEN/PELVIS: Multiple segments of accentuated bowel activity without CT abnormality, hence considered physiologic. Incidental CT findings:  Cholecystectomy. Nonobstructive right nephrolithiasis. Atherosclerosis is present, including aortoiliac atherosclerotic disease. Sigmoid colon diverticulosis. Activity in the right hand is injection related. SKELETON: As noted above, there is sclerosis and some irregularity in the clivus likely representing invasion of the posterior nasopharyngeal mass, not independently measurable separate from the posterior nasopharyngeal mass. Incidental CT findings: Postoperative findings in the lower cervical and upper  thoracic spine. IMPRESSION: 1. Substantially reduced size of the posterior nasopharyngeal mass. Current maximum SUV is 12.5, compatible with malignancy. There still adjacent sclerosis and irregularity in the clivus probably from some degree of local invasion. 2. Bilateral single level IIa lymph nodes are mildly enlarged and hypermetabolic as detailed above, compatible with malignant involvement. 3. No findings of metastatic disease to the chest, abdomen, or pelvis. 4. Other imaging findings of potential clinical significance: Aortic Atherosclerosis (ICD10-I70.0). Mild mitral valve calcifications. Emphysema (ICD10-J43.9). Nonobstructive right nephrolithiasis. Sigmoid colon diverticulosis. Electronically Signed   By: Van Clines M.D.   On: 06/20/2021 08:24   MR FACE/TRIGEMINAL WO/W CM  Result Date: 06/19/2021 CLINICAL DATA:  60 year old male with nasopharyngeal mass, skull base erosion, bilateral cervical lymphadenopathy. Squamous cell carcinoma. EXAM: MRI FACE TRIGEMINAL WITHOUT AND WITH CONTRAST TECHNIQUE: Multiplanar, multi-echo pulse sequences of the face and surrounding structures, including thin-slice imaging of the trigeminal nerves, were acquired before and after intravenous contrast administration. CONTRAST:  38mL GADAVIST GADOBUTROL 1 MMOL/ML IV SOLN COMPARISON:  CT neck 05/25/2021. MRI cervical spine 04/27/2021. FINDINGS: Bulky but fairly symmetric increased soft tissue of the nasopharynx with  fairly homogeneous increased STIR signal (series 8, image 16) and enhancement (series 12, image 17). Outside of the skull base this measures up to 2 cm in thickness (series 12, image 16). However, there is extensive invasion of the midline bony skull base including the clivus (series 5, image 14) with associated enhancement of the invasive tumor into the floor of the sphenoid sinus, and all the way to the floor of the sella turcica. In the transverse plane this involves the central roughly 3.9 cm of the skull base, with margins with more normal marrow signal visible on series 10, image 28. The occipital condyles are spared. The visible cervical vertebrae are spared. Partially visible prior cervical ACDF beginning at C3. Brainstem and cisternal 5th nerve segments are within normal limits. Bilateral Meckel's cave remain normal. Bilateral V3 trunks and inferior alveolar nerves remain symmetric and within normal limits. The V2 trunks remain within normal limits, although the left is more easily identified following contrast on series 3, image 23. The infraorbital nerves are symmetric and within normal limits. And there is no intraorbital mass or abnormal enhancement identified. No dural thickening is identified at the skull base (CSF pulsation artifact in the prepontine cistern on several sequences). The cavernous sinus remains symmetric. Major intracranial vascular flow voids are preserved. Mild sphenoid sinus mucosal thickening is greater on the left and appears to be reactive. The nasal cavity appears to be spared. Mastoids are clear. Visible internal auditory structures appear normal. Visible salivary glands and superficial face soft tissues are within normal limits. Partially visible enlarged and heterogeneous bilateral level 2A cervical lymph nodes are stable since last month, best on series 8, images 30 (13 mm on the left) and 28 (12 mm on the right). Smaller visible bilateral retropharyngeal, level 1 and other  level 2 lymph nodes appear stable. IMPRESSION: 1. Extensive tumor invasion of the skull base, throughout an area of roughly 4 cm in the midline including the entire clivus, posterior sphenoid sinuses, and the floor of the bony sella. 2. But no Trigeminal or other distinct cranial nerve involvement is identified. And no intracranial extension is evident. 3. Primary bulky, roughly 2 cm thick bilateral nasopharyngeal soft tissue mass and visible abnormal bilateral level 2 nodes are stable from the CT last month. Electronically Signed   By: Genevie Ann M.D.   On: 06/19/2021 15:53  IMPRESSION/PLAN:  This is a delightful patient with head and neck cancer - nasopharyngeal SCC.  Following induction chemotherapy, I recommend radiotherapy for this patient with concurrent chemotherapy.  We discussed the potential risks, benefits, and side effects of radiotherapy. We talked in detail about acute and late effects. We discussed that some of the most bothersome acute effects may be mucositis, dysgeusia, salivary changes, skin irritation, hair loss, dehydration, weight loss and fatigue. We talked about late effects which include but are not necessarily limited to dysphagia, hypothyroidism, nerve injury, vascular injury, spinal cord injury, xerostomia, trismus, neck edema, and potential injury to any of the tissues in the head and neck region. No guarantees of treatment were given.The patient is enthusiastic about proceeding with treatment. I look forward to participating in the patient's care.    Simulation (treatment planning) will take place once he is released by med/onc.  Anticipate restaging w/ PET prior to RT planning.  We also discussed that the treatment of head and neck cancer is a multidisciplinary process to maximize treatment outcomes and quality of life. For this reason the following referrals have been or will be made:   Medical oncology to discuss chemotherapy    Dentistry for dental evaluation,  possible extractions in the radiation fields, and /or advice on reducing risk of cavities, osteoradionecrosis, or other oral issues.   Nutritionist for nutrition support during and after treatment.   Speech language pathology for swallowing and/or speech therapy.   Social work for social support.    Physical therapy due to risk of lymphedema in neck and deconditioning.   Baseline labs including TSH.  I asked the patient today about tobacco use. The patient uses tobacco.  I advised the patient to quit. Services were offered by me today including outpatient counseling and pharmacotherapy. I assessed for the willingness to attempt to quit and provided encouragement and demonstrated willingness to make referrals and/or prescriptions to help the patient attempt to quit. The patient has follow-up with the oncologic team to touch base on their tobacco use and /or cessation efforts.  Over 3 minutes were spent on this issue. He chose a Quit date of 06/28/21. Nicotine patches and bupropion Rx'd today.   This encounter was provided by telemedicine platform; patient desired telemedicine during pandemic precautions.  MyChart Video was used. The patient has given verbal consent for this type of encounter and has been advised to only accept a meeting of this type in a secure network environment. On date of service, in total, I spent 55 minutes on this encounter.   The attendants for this meeting include Eppie Gibson  and Zada Finders During the encounter, Eppie Gibson was located at [facility] Radiation Oncology Department.  Yostin Shelle Iron was located at home.   __________________________________________   Eppie Gibson, MD  This document serves as a record of services personally performed by Eppie Gibson, MD. It was created on her behalf by Roney Mans, a trained medical scribe. The creation of this record is based on the scribe's personal observations and the provider's statements to  them. This document has been checked and approved by the attending provider.

## 2021-06-20 NOTE — Progress Notes (Signed)
Oncology Nurse Navigator Documentation   Met with patient during initial MyChart  video consult with He was accompanied by his wife.  Further introduced myself as his/their Navigator, explained my role as a member of the Care Team. Assisted with post-consult appt scheduling. They verbalized understanding of information provided. I encouraged them to call with questions/concerns moving forward.  Harlow Asa, RN, BSN, OCN Head & Neck Oncology Nurse [place] [facility] [place] [phone]

## 2021-06-21 ENCOUNTER — Inpatient Hospital Stay (HOSPITAL_BASED_OUTPATIENT_CLINIC_OR_DEPARTMENT_OTHER): Payer: BC Managed Care – PPO | Admitting: Oncology

## 2021-06-21 ENCOUNTER — Other Ambulatory Visit (HOSPITAL_BASED_OUTPATIENT_CLINIC_OR_DEPARTMENT_OTHER): Payer: Self-pay

## 2021-06-21 VITALS — BP 131/77 | HR 98 | Temp 97.9°F | Resp 18 | Ht 73.0 in | Wt 264.8 lb

## 2021-06-21 DIAGNOSIS — J449 Chronic obstructive pulmonary disease, unspecified: Secondary | ICD-10-CM | POA: Diagnosis not present

## 2021-06-21 DIAGNOSIS — C119 Malignant neoplasm of nasopharynx, unspecified: Secondary | ICD-10-CM

## 2021-06-21 DIAGNOSIS — E119 Type 2 diabetes mellitus without complications: Secondary | ICD-10-CM | POA: Diagnosis not present

## 2021-06-21 DIAGNOSIS — Z5111 Encounter for antineoplastic chemotherapy: Secondary | ICD-10-CM | POA: Diagnosis not present

## 2021-06-21 DIAGNOSIS — Z79899 Other long term (current) drug therapy: Secondary | ICD-10-CM | POA: Diagnosis not present

## 2021-06-21 DIAGNOSIS — E039 Hypothyroidism, unspecified: Secondary | ICD-10-CM | POA: Diagnosis not present

## 2021-06-21 DIAGNOSIS — M069 Rheumatoid arthritis, unspecified: Secondary | ICD-10-CM | POA: Diagnosis not present

## 2021-06-21 MED ORDER — INFLUENZA VAC SPLIT QUAD 0.5 ML IM SUSY
PREFILLED_SYRINGE | INTRAMUSCULAR | 0 refills | Status: DC
  Filled 2021-06-21: qty 0.5, 1d supply, fill #0

## 2021-06-21 NOTE — Progress Notes (Signed)
[facility] [place] OFFICE PROGRESS NOTE   Diagnosis: Nasopharyngeal carcinoma  INTERVAL HISTORY:   Dustin [name] returns as scheduled.  He continues to have a headache.  The headache is relieved with hydrocodone.  No other complaint.  He underwent a staging face MRI and PET scan on 06/19/2021.  He is scheduled for placement of a Port-A-Cath next week.  He saw Dr. Isidore Moos yesterday.  Objective:  Vital signs in last 24 hours:  Blood pressure 131/77, pulse 98, temperature 97.9 F (36.6 C), temperature source Oral, resp. rate 18, height 6\' 1"  (1.854 m), weight 264 lb 12.8 oz (120.1 kg), SpO2 96 %.    Lymphatics: 2 cm high left anterior cervical node Resp: Distant breath sounds, scattered wheeze Cardio: Regular rate and rhythm GI: No hepatosplenomegaly Vascular: No leg edema  Lab Results:  Lab Results  Component Value Date   WBC 7.5 06/20/2021   HGB 14.8 06/20/2021   HCT 45.1 06/20/2021   MCV 99.6 06/20/2021   PLT 267 06/20/2021   NEUTROABS 5.0 06/20/2021    CMP  Lab Results  Component Value Date   NA 139 06/20/2021   K 4.7 06/20/2021   CL 102 06/20/2021   CO2 31 06/20/2021   GLUCOSE 188 (H) 06/20/2021   BUN 26 (H) 06/20/2021   CREATININE 0.75 06/20/2021   CALCIUM 10.0 06/20/2021   PROT 7.7 06/20/2021   ALBUMIN 4.5 06/20/2021   AST 13 (L) 06/20/2021   ALT 11 06/20/2021   ALKPHOS 48 06/20/2021   BILITOT 1.0 06/20/2021   GFRNONAA >60 06/20/2021   GFRAA 108 09/29/2020    Imaging:  NM PET Image Initial (PI) Skull Base To Thigh  Result Date: 06/20/2021 CLINICAL DATA:  Initial treatment strategy for nasopharyngeal malignancy. EXAM: NUCLEAR MEDICINE PET SKULL BASE TO THIGH TECHNIQUE: 13.2 mCi F-18 FDG was injected intravenously. Full-ring PET imaging was performed from the skull base to thigh after the radiotracer. CT data was obtained and used for attenuation correction and anatomic localization. Fasting blood glucose: 143 mg/dl COMPARISON:  CT neck 05/25/2021  FINDINGS: Mediastinal blood pool activity: SUV max 3.2 Liver activity: SUV max 4.7 NECK: The posterior nasopharyngeal mass partially invading the clivus has a maximum SUV of 12.5, compatible with malignancy. This is fairly bilaterally symmetric at this time. The anterior-posterior thickness of this mass is 1.8 cm on image 9 of series 4, previously 2.5 cm on 05/25/2021. A right level IIa lymph node measuring 1.1 cm in short axis on image 21 series 4 has maximum SUV of 10.2. This previously measured 1.2 cm in short axis on 05/25/2021. A left level IIa lymph node measures 1.1 cm in short axis on image 23 series 4 with maximum SUV 7.4. This previously measured 1.1 cm in short axis on 05/25/2021. There some small left level IIb lymph nodes. A left level V lymph node measuring 0.4 cm in short axis on image 29 series 4 has a maximum SUV of 2.0. Incidental CT findings: none CHEST: No significant abnormal hypermetabolic activity in this region. Incidental CT findings: Mild atherosclerotic calcification of the aortic arch and branch vessels. Mild mitral valve calcification. Centrilobular emphysema. ABDOMEN/PELVIS: Multiple segments of accentuated bowel activity without CT abnormality, hence considered physiologic. Incidental CT findings: Cholecystectomy. Nonobstructive right nephrolithiasis. Atherosclerosis is present, including aortoiliac atherosclerotic disease. Sigmoid colon diverticulosis. Activity in the right hand is injection related. SKELETON: As noted above, there is sclerosis and some irregularity in the clivus likely representing invasion of the posterior nasopharyngeal mass, not independently measurable  separate from the posterior nasopharyngeal mass. Incidental CT findings: Postoperative findings in the lower cervical and upper thoracic spine. IMPRESSION: 1. Substantially reduced size of the posterior nasopharyngeal mass. Current maximum SUV is 12.5, compatible with malignancy. There still adjacent sclerosis and  irregularity in the clivus probably from some degree of local invasion. 2. Bilateral single level IIa lymph nodes are mildly enlarged and hypermetabolic as detailed above, compatible with malignant involvement. 3. No findings of metastatic disease to the chest, abdomen, or pelvis. 4. Other imaging findings of potential clinical significance: Aortic Atherosclerosis (ICD10-I70.0). Mild mitral valve calcifications. Emphysema (ICD10-J43.9). Nonobstructive right nephrolithiasis. Sigmoid colon diverticulosis. Electronically Signed   By: Van Clines M.D.   On: 06/20/2021 08:24   MR FACE/TRIGEMINAL WO/W CM  Result Date: 06/19/2021 CLINICAL DATA:  60 year old male with nasopharyngeal mass, skull base erosion, bilateral cervical lymphadenopathy. Squamous cell carcinoma. EXAM: MRI FACE TRIGEMINAL WITHOUT AND WITH CONTRAST TECHNIQUE: Multiplanar, multi-echo pulse sequences of the face and surrounding structures, including thin-slice imaging of the trigeminal nerves, were acquired before and after intravenous contrast administration. CONTRAST:  35mL GADAVIST GADOBUTROL 1 MMOL/ML IV SOLN COMPARISON:  CT neck 05/25/2021. MRI cervical spine 04/27/2021. FINDINGS: Bulky but fairly symmetric increased soft tissue of the nasopharynx with fairly homogeneous increased STIR signal (series 8, image 16) and enhancement (series 12, image 17). Outside of the skull base this measures up to 2 cm in thickness (series 12, image 16). However, there is extensive invasion of the midline bony skull base including the clivus (series 5, image 14) with associated enhancement of the invasive tumor into the floor of the sphenoid sinus, and all the way to the floor of the sella turcica. In the transverse plane this involves the central roughly 3.9 cm of the skull base, with margins with more normal marrow signal visible on series 10, image 28. The occipital condyles are spared. The visible cervical vertebrae are spared. Partially visible prior  cervical ACDF beginning at C3. Brainstem and cisternal 5th nerve segments are within normal limits. Bilateral Meckel's cave remain normal. Bilateral V3 trunks and inferior alveolar nerves remain symmetric and within normal limits. The V2 trunks remain within normal limits, although the left is more easily identified following contrast on series 3, image 23. The infraorbital nerves are symmetric and within normal limits. And there is no intraorbital mass or abnormal enhancement identified. No dural thickening is identified at the skull base (CSF pulsation artifact in the prepontine cistern on several sequences). The cavernous sinus remains symmetric. Major intracranial vascular flow voids are preserved. Mild sphenoid sinus mucosal thickening is greater on the left and appears to be reactive. The nasal cavity appears to be spared. Mastoids are clear. Visible internal auditory structures appear normal. Visible salivary glands and superficial face soft tissues are within normal limits. Partially visible enlarged and heterogeneous bilateral level 2A cervical lymph nodes are stable since last month, best on series 8, images 30 (13 mm on the left) and 28 (12 mm on the right). Smaller visible bilateral retropharyngeal, level 1 and other level 2 lymph nodes appear stable. IMPRESSION: 1. Extensive tumor invasion of the skull base, throughout an area of roughly 4 cm in the midline including the entire clivus, posterior sphenoid sinuses, and the floor of the bony sella. 2. But no Trigeminal or other distinct cranial nerve involvement is identified. And no intracranial extension is evident. 3. Primary bulky, roughly 2 cm thick bilateral nasopharyngeal soft tissue mass and visible abnormal bilateral level 2 nodes are stable from the  CT last month. Electronically Signed   By: Genevie Ann M.D.   On: 06/19/2021 15:53    Medications: I have reviewed the patient's current medications.   Assessment/Plan: Nasopharyngeal cancer,  presenting with left neck adenopathy MRI cervical spine 04/27/2021-bilateral enlarged upper jugular chain lymph nodes.  Right level 2 node measures 18 mm CT neck 05/25/2021-bilateral nasopharyngeal mass eroding the skull base at the level of the clivus and inferior sphenoid sinus, enlarged right jugular digastric node into enlarged left level 2 nodes, mild soft tissue density in the inferior left sphenoid sinus-tumor? 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 MRI face 06/19/2021-extensive tumor invasion of the skull base, no trigeminal or other cranial nerve involvement, abnormal bilateral level 2 nodes PET 06/19/2021-decrease size of nasopharyngeal mass compared to 05/25/2021 CT, invasion of the clivus, single bilateral level 2A lymph nodes mildly enlarged and hypermetabolic, no evidence of distant metastatic disease Rheumatoid arthritis Diabetes COPD Tobacco use Hypothyroid Remote history ITP, age 64     Disposition: Mr Storey is back diagnosed with locally advanced nasopharyngeal carcinoma.  I reviewed treatment options with him again today.  We discussed induction chemotherapy to be followed by concurrent chemotherapy and radiation, chemotherapy/radiation alone, and adjuvant chemotherapy.  I recommend proceeding with induction gemcitabine/cisplatin to be followed by concurrent chemotherapy and radiation.  We reviewed potential toxicities associated with the gemcitabine/cisplatin regimen including the chance of nausea/vomiting, alopecia, and hematologic toxicity.  We discussed the fever, rash, pneumonitis associated with gemcitabine.  We discussed the neuropathy, nephrotoxicity, and ototoxicity associated with cisplatin.  He agrees to proceed.  He has attended a chemotherapy teaching class.  Mr. Hofferber is scheduled for Port-A-Cath placement on 06/27/2021.  He would like to begin treatment on 07/02/2021.  He was scheduled for gemcitabine/cisplatin 07/02/2021.  He will be seen  for an office visit and day 8 gemcitabine on 07/09/2021.  Mr. Rosato will continue hydrocodone as needed for the headache.    [name], MD  06/21/2021  1:20 PM

## 2021-06-22 ENCOUNTER — Ambulatory Visit: Payer: BC Managed Care – PPO | Attending: Oncology | Admitting: Audiologist

## 2021-06-22 ENCOUNTER — Encounter: Payer: Self-pay | Admitting: *Deleted

## 2021-06-22 ENCOUNTER — Other Ambulatory Visit: Payer: Self-pay

## 2021-06-22 ENCOUNTER — Encounter: Payer: Self-pay | Admitting: Radiation Oncology

## 2021-06-22 DIAGNOSIS — H9313 Tinnitus, bilateral: Secondary | ICD-10-CM | POA: Insufficient documentation

## 2021-06-22 DIAGNOSIS — H903 Sensorineural hearing loss, bilateral: Secondary | ICD-10-CM | POA: Insufficient documentation

## 2021-06-22 DIAGNOSIS — C111 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: Secondary | ICD-10-CM | POA: Insufficient documentation

## 2021-06-22 NOTE — Progress Notes (Signed)
Filled out Disability form and Dr Benay Spice signed and faxed

## 2021-06-22 NOTE — Procedures (Signed)
Outpatient Audiology and [place], [place]  [id number] [phone]  Ototoxic Monitoring Baseline Audiologic Evaluation   NAME: Dustin [name]     DOB:   17-Sep-1960      MRN: [id number]                                                                                     DATE: 06/22/2021     REFERENT: Benay Spice STATUS: Outpatient DIAGNOSIS: Audiologic Evaluation for the Purpose of Ototoxic Monitoring Sensorineural Hearing Loss Bilateral  History: Dustin [name] was seen for an audiological evaluation. Dustin [name] is receiving a hearing evaluation to establish his baseline hearing thresholds before treatment with ototoxic mediation. Dustin [name] has carcinoma of the nasopharynx. He will soon be starting an intensive round of chemotherapy and radiation. Dustin [name] is about to start three sessions of chemotherapy followed by seven weeks of radiatio per his reporting.  Dustin [name] also has diabetes type II which is a risk factor for hearing loss. 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 Dustin [name] say she struggles to heat his 62 year old daughter and grandson. His wife tells him the TV is too loud. He cannot hear in noise. 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 He has heard this for thirty years. He uses a TV to sleep at night.   Results from today's evaluation will be used to measure changes in patient's hearing thresholds during and six months after ototoxic medication exposure.   Evaluation:  Otoscopy showed a clear view of the tympanic membranes, bilaterally Tympanometry results were consistent with normal middle ear function, bilaterally  High Frequency Distortion Product Otoacoustic Emissions (DPOAE's) were absent 2k-12k Hz, present at 1.5k Hz only bilaterally    Audiometric testing was completed using conventional and high frequency audiometry with insert  transducer. Speech Detection Thresholds were  consistent with pure tone averages. Word Recognition was excellent at an elevated level. Pure tone thresholds show normal sloping to severe sensorineural hearing loss in both ears. High frequency audiometry not performed. Dustin [name] has a severe noise notch pattern hearing loss.   Sensitive Range to Ototoxicity (SRO)  is 4k to 8k Hz  three highest frequency thresholds below 100dB tested today.    Results:  The test results were reviewed with [name]. Today's baseline audiological evaluation showed he already has moderate to severe high frequency hearing loss in both ears. He is already a candidate for hearing aids in both ears. Dustin [name] says he did know he had some hearing loss, he struggles to hear small children, in noise, and when people talk from a distance.    Dustin [name] lives in [place]. He is concerned about the amount of driving for all the upcoming appointments. Dustin [name] was told he can schedule the hearing tests on the same day at the last dose of Chemotherapy   During treatment with ototoxic medication hearing is more vulnerable to damage from high levels of noise. Make sure to keep headphone volume below 60% and wear headphones no longer than 60 minutes at a time. Avoid any noise louder than 85B, which is  roughly equivalent to busy traffic noise. Anytime exposure to levels exceed 85dB wear well fitting earplugs with an NRR rating of at least 25dB. Mineral stands for Noise Reduction Rating, and is how much the earplug will reduce the sound volume around you.  For guidance on how to wear earplugs the correct way visit: [email or web address]    Recommendations: 1.  Return for audiological evaluation before every session of chemotherapy. Return one week after radiation to the head. Appointments can be scheduled same day as the start of a new round of chemotherapy to prevent extra trips.  2.  Wear hearing protection with an NRR of  at least 25dB whenever exposed to loud noise. Loud noise is any level above 85dB. For example if people are raising their voices to be heard, the volume is louder than 85dB.  3.  Wear headphones for only 60 minutes at a time, and never louder than 60% volume.   [name]  Audiologist, Au.D., CCC-A 06/22/2021  9:57 AM  Cc:  Benay Spice

## 2021-06-25 ENCOUNTER — Other Ambulatory Visit: Payer: Self-pay

## 2021-06-25 ENCOUNTER — Encounter (HOSPITAL_COMMUNITY): Payer: Self-pay | Admitting: Dentistry

## 2021-06-25 ENCOUNTER — Ambulatory Visit (INDEPENDENT_AMBULATORY_CARE_PROVIDER_SITE_OTHER): Payer: Dental | Admitting: Dentistry

## 2021-06-25 ENCOUNTER — Other Ambulatory Visit (HOSPITAL_COMMUNITY): Payer: Self-pay | Admitting: Physician Assistant

## 2021-06-25 DIAGNOSIS — K0889 Other specified disorders of teeth and supporting structures: Secondary | ICD-10-CM | POA: Diagnosis not present

## 2021-06-25 DIAGNOSIS — K036 Deposits [accretions] on teeth: Secondary | ICD-10-CM

## 2021-06-25 DIAGNOSIS — K06020 Generalized gingival recession, unspecified: Secondary | ICD-10-CM

## 2021-06-25 DIAGNOSIS — K029 Dental caries, unspecified: Secondary | ICD-10-CM | POA: Diagnosis not present

## 2021-06-25 DIAGNOSIS — K0530 Chronic periodontitis, unspecified: Secondary | ICD-10-CM | POA: Diagnosis not present

## 2021-06-25 DIAGNOSIS — C119 Malignant neoplasm of nasopharynx, unspecified: Secondary | ICD-10-CM

## 2021-06-25 NOTE — Progress Notes (Signed)
Department of Dental Medicine        FULL MOUTH DEBRIDEMENT   Service Date:   06/25/2021  Patient Name:   Dustin [name] Date of Birth:   1961/05/04 Medical Record Number: [id number]   TODAY'S VISIT:   Procedures Debridement of remaining dentition Assessment: Soft tissue:  WNL Caries risk:  High Periodontal impression:  Inflamed, erythematous gingiva; chronic periodontitis Plan/recommendations: Follow-up after radiation therapy. Call if any questions or concerns arise before next visit.   06/25/2021 Progress Note:  COVID-19 SCREENING:  The patient denies symptoms concerning for COVID-19 infection including fever, chills, cough, or newly developed shortness of breath.   HISTORY OF PRESENT ILLNESS: Dustin [name] is a very pleasant 60 y.o. male who was recently diagnosed with nasopharyngeal cancer and is anticipating chemoradiation therapy.  He presents today for a full mouth debridement.  Since the patient's last dental visit (pre-radiation consult), he has had an appointment with Dr. Eppie Gibson.  After reviewing Dr. Pearlie Oyster notes and discussing the plan moving forward, dental plan remains unchanged.  We will complete FMD today and then follow-up with the patient after he finishes chemoradiation.   DENTAL HISTORY: Reviewed with the patient, no changes.   CHIEF COMPLAINT: Patient with no complaints. Here for a routine dental appointment.   Patient Active Problem List   Diagnosis Date Noted  . 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 Chest pain of uncertain etiology   . Former smoker 04/18/2017  . Primary insomnia 04/14/2017  . 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 History of positive PPD treated with INH  04/14/2017  . Abscess of left olecranon bursa 11/07/2016  . High risk medication use 11/07/2016  . Cervical disc disease 11/07/2016  . Primary osteoarthritis of both hands  11/07/2016  . Primary osteoarthritis of both knees 11/07/2016  . Primary osteoarthritis of both shoulders 11/07/2016  . Precordial pain 01/22/2016  . Pain in the chest   . Hyperlipidemia 01/20/2016  . Depression with anxiety 01/20/2016  . GERD (gastroesophageal reflux disease) 01/20/2016  . Type 2 diabetes mellitus ([place]) 01/20/2016  . Chest pain at rest 09/22/2014  . Tobacco use 09/22/2014  . Morbid obesity ([place]) 09/22/2014  . Rheumatoid arthritis ([place]) 09/22/2014  . Family history of heart disease 09/22/2014  . Chest pain 09/22/2014   Past Medical History:  Diagnosis Date  . Anxiety   . COPD (chronic obstructive pulmonary disease) ([place])   . Depression   . Essential hypertension   . GERD (gastroesophageal reflux disease)   . H/O hiatal hernia   . History of cardiac catheterization    Normal coronaries May 2017  . History of ITP    Childhood  . Hypothyroidism   . Osteoarthritis   . Type 2 diabetes mellitus ([place])    no meds   Past Surgical History:  Procedure Laterality Date  . ANTERIOR CERVICAL DECOMP/DISCECTOMY FUSION  01/21/2012   Procedure: ANTERIOR CERVICAL DECOMPRESSION/DISCECTOMY FUSION 2 LEVELS;  Surgeon: Erline Levine, MD;  Location: [facility];  Service: Neurosurgery;  Laterality: N/A;  Cervical Six-Seven, Cervical Seven-Thoracic One, Anterior Cervical Decompression with Fusion Interbody Prothesis Plating and Bonegraft possible posterior Cervical Seven-Thoracic One Foraminotomy  . CARDIAC CATHETERIZATION  [phone]   normal coronary arteries  . CARDIAC CATHETERIZATION N/A 01/22/2016   Procedure: Left Heart Cath and Coronary Angiography;  Surgeon: Burnell Blanks, MD;  Location: [facility];  Service:  Cardiovascular;  Laterality: N/A;  . CARPAL TUNNEL RELEASE Bilateral   . CERVICAL DISC SURGERY  04  . CHOLECYSTECTOMY N/A 10/26/2014   Procedure: LAPAROSCOPIC CHOLECYSTECTOMY;  Surgeon: Jamesetta So, MD;  Location: [facility];  Service: General;   Laterality: N/A;  . ESOPHAGOGASTRODUODENOSCOPY N/A 05/03/2016   Procedure: ESOPHAGOGASTRODUODENOSCOPY (EGD);  Surgeon: Rogene Houston, MD;  Location: [facility];  Service: Endoscopy;  Laterality: N/A;  10:30  . HERNIA REPAIR Right 60's  . Left elbow bursa removed    . NASOPHARYNGOSCOPY N/A 06/04/2021   Procedure: NASAL ENDOSCOPY WITH BIOPSY OF NASOPHARYNX; FROZEN SECTION;  Surgeon: Izora Gala, MD;  Location: [facility];  Service: ENT;  Laterality: N/A;  . TONSILLECTOMY  72   No Known Allergies Current Outpatient Medications  Medication Sig Dispense Refill  . ALPRAZolam (XANAX) 1 MG tablet Take 1 mg by mouth 3 (three) times daily as needed for anxiety.    Marland Kitchen atorvastatin (LIPITOR) 10 MG tablet Take 10 mg by mouth at bedtime.    Marland Kitchen buPROPion (WELLBUTRIN SR) 150 MG 12 hr tablet Start one week before quit date. Take 1 tab daily x 3 days, then 1 tab BID thereafter. 60 tablet 2  . Cholecalciferol (DIALYVITE VITAMIN D 5000 PO) Take 1 capsule by mouth daily.    Marland Kitchen COVID-19 mRNA bivalent vaccine, Moderna, (MODERNA COVID-19 BIVAL BOOSTER) 50 MCG/0.5ML injection Inject into the muscle. 0.5 mL 0  . folic acid (FOLVITE) 1 MG tablet TAKE 1 TABLET BY MOUTH TWICE DAILY 180 tablet 2  . HYDROcodone-acetaminophen (HYCET) 7.5-325 mg/15 ml solution Take 10-15 mLs by mouth every 6 (six) hours as needed for moderate pain. 240 mL 0  . influenza vac split quadrivalent PF (FLUARIX) 0.5 ML injection Inject into the muscle. 0.5 mL 0  . levothyroxine (SYNTHROID) 50 MCG tablet Take 50 mcg by mouth at bedtime.    . lidocaine-prilocaine (EMLA) cream Apply 1 application topically as needed. (Patient not taking: Reported on 06/21/2021) 30 g 0  . lisinopril (ZESTRIL) 2.5 MG tablet Take by mouth.    . metFORMIN (GLUCOPHAGE) 500 MG tablet Take 500 mg by mouth 2 (two) times daily with a meal.    . methotrexate (RHEUMATREX) 2.5 MG tablet TAKE EIGHT TABLETS BY MOUTH ONCE WEEKLY (Patient not taking: No sig reported) 96 tablet 0  .  nicotine (NICODERM CQ - DOSED IN MG/24 HOURS) 14 mg/24hr patch Place 1 patch (14 mg total) onto the skin daily. Apply 21 mg patch daily x 6 wk, then 14mg  patch daily x 2 wk, then 7 mg patch daily x 2 wk 14 patch 0  . nicotine (NICODERM CQ - DOSED IN MG/24 HOURS) 21 mg/24hr patch Place 1 patch (21 mg total) onto the skin daily. Apply 21 mg patch daily x 6 wk, then 14mg  patch daily x 2 wk, then 7 mg patch daily x 2 wk 14 patch 2  . nicotine (NICODERM CQ - DOSED IN MG/24 HR) 7 mg/24hr patch Place 1 patch (7 mg total) onto the skin daily. Apply 21 mg patch daily x 6 wk, then 14mg  patch daily x 2 wk, then 7 mg patch daily x 2 wk 14 patch 0  . ondansetron (ZOFRAN) 8 MG tablet Take 1 tablet (8 mg total) by mouth every 8 (eight) hours as needed for nausea or vomiting (As needed starting 3 days after Cisplatin chemo). 20 tablet 0  . pantoprazole (PROTONIX) 40 MG tablet Take 40 mg by mouth at bedtime.    . prochlorperazine (COMPAZINE)  10 MG tablet Take 1 tablet (10 mg total) by mouth every 6 (six) hours as needed for nausea or vomiting. 30 tablet 0  . sildenafil (VIAGRA) 25 MG tablet Take 25 mg by mouth daily as needed for erectile dysfunction.    Marland Kitchen zolpidem (AMBIEN) 10 MG tablet Take 10 mg by mouth at bedtime as needed for sleep. Reported on 01/20/2016     No current facility-administered medications for this visit.    LABS: Lab Results  Component Value Date   WBC 7.5 06/20/2021   HGB 14.8 06/20/2021   HCT 45.1 06/20/2021   MCV 99.6 06/20/2021   PLT 267 06/20/2021      Component Value Date/Time   NA 139 06/20/2021 1140   NA 143 01/12/2021 1113   K 4.7 06/20/2021 1140   CL 102 06/20/2021 1140   CO2 31 06/20/2021 1140   GLUCOSE 188 (H) 06/20/2021 1140   BUN 26 (H) 06/20/2021 1140   BUN 22 01/12/2021 1113   CREATININE 0.75 06/20/2021 1140   CREATININE 1.00 04/18/2017 1056   CALCIUM 10.0 06/20/2021 1140   GFRNONAA >60 06/20/2021 1140   GFRNONAA 83 04/18/2017 1056   GFRAA 108 09/29/2020 1004    GFRAA >89 04/18/2017 1056   Lab Results  Component Value Date   INR 1.11 01/22/2016   No results found for: PTT  Social History   Socioeconomic History  . Marital status: Married    Spouse name: Not on file  . Number of children: Not on file  . Years of education: Not on file  . Highest education level: Not on file  Occupational History  . Not on file  Tobacco Use  . Smoking status: Every Day    Packs/day: 1.00    Years: 30.00    Pack years: 30.00    Types: Cigarettes    Start date: 03/13/1974    Last attempt to quit: 09/16/2017    Years since quitting: 3.7  . Smokeless tobacco: Never  Vaping Use  . Vaping Use: Never used  Substance and Sexual Activity  . Alcohol use: No    Alcohol/week: 0.0 standard drinks  . Drug use: Never  . Sexual activity: Yes    Partners: Female  Other Topics Concern  . Not on file  Social History Narrative  . Not on file   Social Determinants of Health   Financial Resource Strain: Not on file  Food Insecurity: Not on file  Transportation Needs: Not on file  Physical Activity: Not on file  Stress: Not on file  Social Connections: Not on file  Intimate Partner Violence: Not on file   Family History  Problem Relation Age of Onset  . Heart disease Mother   . COPD Mother   . Heart attack Father        Sudden death  . Heart attack Sister 96  . Kidney disease Sister   . Heart disease Sister   . Heart attack Brother   . Heart disease Brother   . Diabetes Brother   . Heart attack Brother   . Heart disease Brother 64       CABG  . Heart attack Brother 59    REVIEW OF SYSTEMS:  Reviewed with the patient as per HPI.   VITAL SIGNS: BP 131/78 (BP Location: Left Arm, Patient Position: Sitting, Cuff Size: Normal)   Pulse 77   Temp 97.9 F (36.6 C) (Oral)    ASSESSMENT/INDICATIONS:  1.  Patient with anticipated radiation to the head  and neck 2.  Accretions on teeth 3.  Chronic periodontitits 4.  Loose teeth  5.  Gingival  recession 6.  Caries   PROCEDURES: Debridement of remaining dentition. Removed all supra-gingival calculus and plaque using Cavitron and hand instruments. Removed large subgingival calculus build-up with Cavitron; sensitivity in areas of severe recession.  Generalized bleeding due to irritation of tissue and amount of calculus/debris removed. Polished all teeth.  Flossed. Oral hygiene instruction discussed with the patient.  Encouraged better brushing and to begin flossing at least once daily. Toothbrush, toothpaste and floss given to the patient.   PLAN AND RECOMMENDATIONS: Follow-up after the completion of radiation therapy. Call if any questions or concerns arise before then.  All questions and concerns were invited and addressed.  The patient tolerated today's visit well and departed in stable condition.     [place] Benson [place], D.M.D.

## 2021-06-27 ENCOUNTER — Ambulatory Visit (HOSPITAL_COMMUNITY)
Admission: RE | Admit: 2021-06-27 | Discharge: 2021-06-27 | Disposition: A | Discharge status: Home / Self Care | Payer: BC Managed Care – PPO | Source: Ambulatory Visit | Attending: Oncology | Admitting: Oncology

## 2021-06-27 ENCOUNTER — Other Ambulatory Visit: Payer: Self-pay | Admitting: Oncology

## 2021-06-27 ENCOUNTER — Encounter (HOSPITAL_COMMUNITY): Payer: Self-pay

## 2021-06-27 ENCOUNTER — Other Ambulatory Visit: Payer: Self-pay

## 2021-06-27 DIAGNOSIS — Z7984 Long term (current) use of oral hypoglycemic drugs: Secondary | ICD-10-CM | POA: Diagnosis not present

## 2021-06-27 DIAGNOSIS — Z452 Encounter for adjustment and management of vascular access device: Secondary | ICD-10-CM | POA: Diagnosis not present

## 2021-06-27 DIAGNOSIS — K219 Gastro-esophageal reflux disease without esophagitis: Secondary | ICD-10-CM | POA: Insufficient documentation

## 2021-06-27 DIAGNOSIS — Z87891 Personal history of nicotine dependence: Secondary | ICD-10-CM | POA: Insufficient documentation

## 2021-06-27 DIAGNOSIS — J449 Chronic obstructive pulmonary disease, unspecified: Secondary | ICD-10-CM | POA: Insufficient documentation

## 2021-06-27 DIAGNOSIS — C119 Malignant neoplasm of nasopharynx, unspecified: Secondary | ICD-10-CM

## 2021-06-27 DIAGNOSIS — Z79890 Hormone replacement therapy (postmenopausal): Secondary | ICD-10-CM | POA: Diagnosis not present

## 2021-06-27 DIAGNOSIS — E039 Hypothyroidism, unspecified: Secondary | ICD-10-CM | POA: Insufficient documentation

## 2021-06-27 DIAGNOSIS — Z9889 Other specified postprocedural states: Secondary | ICD-10-CM

## 2021-06-27 DIAGNOSIS — E119 Type 2 diabetes mellitus without complications: Secondary | ICD-10-CM | POA: Insufficient documentation

## 2021-06-27 DIAGNOSIS — I10 Essential (primary) hypertension: Secondary | ICD-10-CM | POA: Diagnosis not present

## 2021-06-27 HISTORY — PX: IR IMAGING GUIDED PORT INSERTION: IMG5740

## 2021-06-27 LAB — GLUCOSE, CAPILLARY: Glucose-Capillary: 124 mg/dL — ABNORMAL HIGH (ref 70–99)

## 2021-06-27 MED ORDER — MIDAZOLAM HCL 2 MG/2ML IJ SOLN
INTRAMUSCULAR | Status: AC
  Filled 2021-06-27: qty 4

## 2021-06-27 MED ORDER — FENTANYL CITRATE (PF) 100 MCG/2ML IJ SOLN
INTRAMUSCULAR | Status: AC
  Filled 2021-06-27: qty 2

## 2021-06-27 MED ORDER — MIDAZOLAM HCL 2 MG/2ML IJ SOLN
INTRAMUSCULAR | Status: DC | PRN
  Administered 2021-06-27 (×2): 1 mg via INTRAVENOUS

## 2021-06-27 MED ORDER — HEPARIN SOD (PORK) LOCK FLUSH 100 UNIT/ML IV SOLN
INTRAVENOUS | Status: AC
  Filled 2021-06-27: qty 5

## 2021-06-27 MED ORDER — LIDOCAINE HCL (PF) 1 % IJ SOLN
INTRAMUSCULAR | Status: DC | PRN
  Administered 2021-06-27: 10 mL via INTRADERMAL

## 2021-06-27 MED ORDER — LIDOCAINE HCL 1 % IJ SOLN
INTRAMUSCULAR | Status: AC
  Filled 2021-06-27: qty 20

## 2021-06-27 MED ORDER — SODIUM CHLORIDE 0.9 % IV SOLN: INTRAVENOUS | Status: DC

## 2021-06-27 MED ORDER — MIDAZOLAM HCL 2 MG/2ML IJ SOLN
INTRAMUSCULAR | Status: DC | PRN
  Administered 2021-06-27: 1 mg via INTRAVENOUS

## 2021-06-27 MED ORDER — HEPARIN SOD (PORK) LOCK FLUSH 100 UNIT/ML IV SOLN
INTRAVENOUS | Status: DC | PRN
  Administered 2021-06-27: 500 [IU] via INTRAVENOUS

## 2021-06-27 MED ORDER — FENTANYL CITRATE (PF) 100 MCG/2ML IJ SOLN
INTRAMUSCULAR | Status: DC | PRN
  Administered 2021-06-27: 50 ug via INTRAVENOUS

## 2021-06-27 NOTE — Discharge Instructions (Signed)

## 2021-06-27 NOTE — Consult Note (Signed)
Chief Complaint: Patient was seen in consultation today for Port-A-Cath placement  Referring Physician(s): Sherrill,Gary B  Supervising Physician: Aletta Edouard  Patient Status: [facility] - Out-pt  History of Present Illness: Dustin [name] is a 60 y.o. male, ex-smoker, with history of anxiety/depression, COPD, hypertension, GERD, childhood ITP, hypothyroidism, osteoarthritis, diabetes and now with newly diagnosed nasopharyngeal carcinoma.  He has poor venous access and presents today for Port-A-Cath placement to assist with chemotherapy administration.  Past Medical History:  Diagnosis Date   Anxiety    COPD (chronic obstructive pulmonary disease) ([place])    Depression    Essential hypertension    GERD (gastroesophageal reflux disease)    H/O hiatal hernia    History of cardiac catheterization    Normal coronaries May 2017   History of ITP    Childhood   Hypothyroidism    Osteoarthritis    Type 2 diabetes mellitus ([place])    no meds    Past Surgical History:  Procedure Laterality Date   ANTERIOR CERVICAL DECOMP/DISCECTOMY FUSION  01/21/2012   Procedure: ANTERIOR CERVICAL DECOMPRESSION/DISCECTOMY FUSION 2 LEVELS;  Surgeon: Erline Levine, MD;  Location: [facility];  Service: Neurosurgery;  Laterality: N/A;  Cervical Six-Seven, Cervical Seven-Thoracic One, Anterior Cervical Decompression with Fusion Interbody Prothesis Plating and Bonegraft possible posterior Cervical Seven-Thoracic One Foraminotomy   CARDIAC CATHETERIZATION  [id number]   normal coronary arteries   CARDIAC CATHETERIZATION N/A 01/22/2016   Procedure: Left Heart Cath and Coronary Angiography;  Surgeon: Burnell Blanks, MD;  Location: [facility] LAB;  Service: Cardiovascular;  Laterality: N/A;   CARPAL TUNNEL RELEASE Bilateral    CERVICAL DISC SURGERY  04   CHOLECYSTECTOMY N/A 10/26/2014   Procedure: LAPAROSCOPIC CHOLECYSTECTOMY;  Surgeon: Jamesetta So, MD;  Location: [facility];  Service: General;   Laterality: N/A;   ESOPHAGOGASTRODUODENOSCOPY N/A 05/03/2016   Procedure: ESOPHAGOGASTRODUODENOSCOPY (EGD);  Surgeon: Rogene Houston, MD;  Location: [facility];  Service: Endoscopy;  Laterality: N/A;  10:30   HERNIA REPAIR Right 60's   Left elbow bursa removed     NASOPHARYNGOSCOPY N/A 06/04/2021   Procedure: NASAL ENDOSCOPY WITH BIOPSY OF NASOPHARYNX; FROZEN SECTION;  Surgeon: Izora Gala, MD;  Location: [place];  Service: ENT;  Laterality: N/A;   TONSILLECTOMY  74    Allergies: Patient has no known allergies.  Medications: Prior to Admission medications   Medication Sig Start Date End Date Taking? Authorizing Provider  ALPRAZolam Duanne Moron) 1 MG tablet Take 1 mg by mouth 3 (three) times daily as needed for anxiety.   Yes [provider]  atorvastatin (LIPITOR) 10 MG tablet Take 10 mg by mouth at bedtime. 01/23/21  Yes [provider]  buPROPion (WELLBUTRIN SR) 150 MG 12 hr tablet Start one week before quit date. Take 1 tab daily x 3 days, then 1 tab BID thereafter. 06/20/21  Yes Eppie Gibson, MD  Cholecalciferol (DIALYVITE VITAMIN D 5000 PO) Take 1 capsule by mouth daily.   Yes [provider]  COVID-19 mRNA bivalent vaccine, Moderna, (MODERNA COVID-19 BIVAL BOOSTER) 50 MCG/0.5ML injection Inject into the muscle. 06/20/21  Yes Carlyle Basques, MD  folic acid (FOLVITE) 1 MG tablet TAKE 1 TABLET BY MOUTH TWICE DAILY 12/14/18  Yes Deveshwar, Abel Presto, MD  HYDROcodone-acetaminophen (HYCET) 7.5-325 mg/15 ml solution Take 10-15 mLs by mouth every 6 (six) hours as needed for moderate pain. 06/20/21 06/20/22 Yes Owens Shark, NP  influenza vac split quadrivalent PF (FLUARIX) 0.5 ML injection Inject into the muscle. 06/21/21  Yes Carlyle Basques, MD  levothyroxine (SYNTHROID) 50 MCG tablet Take 50 mcg by mouth at bedtime.   Yes [provider]  lisinopril (ZESTRIL) 2.5 MG tablet Take by mouth.   Yes [provider]  metFORMIN (GLUCOPHAGE) 500 MG tablet Take 500  mg by mouth 2 (two) times daily with a meal.   Yes [provider]  lidocaine-prilocaine (EMLA) cream Apply 1 application topically as needed. Patient not taking: Reported on 06/21/2021 06/20/21   Ladell Pier, MD  methotrexate (RHEUMATREX) 2.5 MG tablet TAKE EIGHT TABLETS BY MOUTH ONCE WEEKLY Patient not taking: No sig reported 09/04/18   Ofilia Neas, PA-C  nicotine (NICODERM CQ - DOSED IN MG/24 HOURS) 14 mg/24hr patch Place 1 patch (14 mg total) onto the skin daily. Apply 21 mg patch daily x 6 wk, then 14mg  patch daily x 2 wk, then 7 mg patch daily x 2 wk 06/20/21   Eppie Gibson, MD  nicotine (NICODERM CQ - DOSED IN MG/24 HOURS) 21 mg/24hr patch Place 1 patch (21 mg total) onto the skin daily. Apply 21 mg patch daily x 6 wk, then 14mg  patch daily x 2 wk, then 7 mg patch daily x 2 wk 06/20/21   Eppie Gibson, MD  nicotine (NICODERM CQ - DOSED IN MG/24 HR) 7 mg/24hr patch Place 1 patch (7 mg total) onto the skin daily. Apply 21 mg patch daily x 6 wk, then 14mg  patch daily x 2 wk, then 7 mg patch daily x 2 wk 06/20/21   Eppie Gibson, MD  ondansetron (ZOFRAN) 8 MG tablet Take 1 tablet (8 mg total) by mouth every 8 (eight) hours as needed for nausea or vomiting (As needed starting 3 days after Cisplatin chemo). 06/20/21   Ladell Pier, MD  pantoprazole (PROTONIX) 40 MG tablet Take 40 mg by mouth at bedtime.    [provider]  prochlorperazine (COMPAZINE) 10 MG tablet Take 1 tablet (10 mg total) by mouth every 6 (six) hours as needed for nausea or vomiting. 06/20/21   Ladell Pier, MD  sildenafil (VIAGRA) 25 MG tablet Take 25 mg by mouth daily as needed for erectile dysfunction. 01/12/21   [provider]  zolpidem (AMBIEN) 10 MG tablet Take 10 mg by mouth at bedtime as needed for sleep. Reported on 01/20/2016    [provider]     Family History  Problem Relation Age of Onset   Heart disease Mother    COPD Mother    Heart attack Father        Sudden death    Heart attack Sister 7   Kidney disease Sister    Heart disease Sister    Heart attack Brother    Heart disease Brother    Diabetes Brother    Heart attack Brother    Heart disease Brother 32       CABG   Heart attack Brother 19    Social History   Socioeconomic History   Marital status: Married    Spouse name: Not on file   Number of children: Not on file   Years of education: Not on file   Highest education level: Not on file  Occupational History   Not on file  Tobacco Use   Smoking status: Every Day    Packs/day: 1.00    Years: 30.00    Pack years: 30.00    Types: Cigarettes    Start date: 03/13/1974    Last attempt to quit: 09/16/2017  Years since quitting: 3.7   Smokeless tobacco: Never  Vaping Use   Vaping Use: Never used  Substance and Sexual Activity   Alcohol use: No    Alcohol/week: 0.0 standard drinks   Drug use: Never   Sexual activity: Yes    Partners: Female  Other Topics Concern   Not on file  Social History Narrative   Not on file   Social Determinants of Health   Financial Resource Strain: Not on file  Food Insecurity: Not on file  Transportation Needs: Not on file  Physical Activity: Not on file  Stress: Not on file  Social Connections: Not on file      Review of Systems currently denies fever, chest pain, dyspnea, cough, abdominal/back pain, nausea, vomiting or bleeding.  He does have headaches.  Vital Signs: BP 126/86   Pulse 83   Temp 98.1 F (36.7 C) (Oral)   Resp 18   Ht 6\' 1"  (1.854 m)   Wt 265 lb (120.2 kg)   SpO2 96%   BMI 34.96 kg/m   Physical Exam awake, alert.  Chest with distant breath sounds bilaterally.  Heart with regular rate and rhythm.  Abdomen obese, soft, positive bowel sounds, nontender.  Trace pretibial edema.  Imaging: NM PET Image Initial (PI) Skull Base To Thigh  Result Date: 06/20/2021 CLINICAL DATA:  Initial treatment strategy for nasopharyngeal malignancy. EXAM: NUCLEAR MEDICINE PET SKULL BASE  TO THIGH TECHNIQUE: 13.2 mCi F-18 FDG was injected intravenously. Full-ring PET imaging was performed from the skull base to thigh after the radiotracer. CT data was obtained and used for attenuation correction and anatomic localization. Fasting blood glucose: 143 mg/dl COMPARISON:  CT neck 05/25/2021 FINDINGS: Mediastinal blood pool activity: SUV max 3.2 Liver activity: SUV max 4.7 NECK: The posterior nasopharyngeal mass partially invading the clivus has a maximum SUV of 12.5, compatible with malignancy. This is fairly bilaterally symmetric at this time. The anterior-posterior thickness of this mass is 1.8 cm on image 9 of series 4, previously 2.5 cm on 05/25/2021. A right level IIa lymph node measuring 1.1 cm in short axis on image 21 series 4 has maximum SUV of 10.2. This previously measured 1.2 cm in short axis on 05/25/2021. A left level IIa lymph node measures 1.1 cm in short axis on image 23 series 4 with maximum SUV 7.4. This previously measured 1.1 cm in short axis on 05/25/2021. There some small left level IIb lymph nodes. A left level V lymph node measuring 0.4 cm in short axis on image 29 series 4 has a maximum SUV of 2.0. Incidental CT findings: none CHEST: No significant abnormal hypermetabolic activity in this region. Incidental CT findings: Mild atherosclerotic calcification of the aortic arch and branch vessels. Mild mitral valve calcification. Centrilobular emphysema. ABDOMEN/PELVIS: Multiple segments of accentuated bowel activity without CT abnormality, hence considered physiologic. Incidental CT findings: Cholecystectomy. Nonobstructive right nephrolithiasis. Atherosclerosis is present, including aortoiliac atherosclerotic disease. Sigmoid colon diverticulosis. Activity in the right hand is injection related. SKELETON: As noted above, there is sclerosis and some irregularity in the clivus likely representing invasion of the posterior nasopharyngeal mass, not independently measurable separate from  the posterior nasopharyngeal mass. Incidental CT findings: Postoperative findings in the lower cervical and upper thoracic spine. IMPRESSION: 1. Substantially reduced size of the posterior nasopharyngeal mass. Current maximum SUV is 12.5, compatible with malignancy. There still adjacent sclerosis and irregularity in the clivus probably from some degree of local invasion. 2. Bilateral single level IIa lymph nodes are mildly  enlarged and hypermetabolic as detailed above, compatible with malignant involvement. 3. No findings of metastatic disease to the chest, abdomen, or pelvis. 4. Other imaging findings of potential clinical significance: Aortic Atherosclerosis (ICD10-I70.0). Mild mitral valve calcifications. Emphysema (ICD10-J43.9). Nonobstructive right nephrolithiasis. Sigmoid colon diverticulosis. Electronically Signed   By: Van Clines M.D.   On: 06/20/2021 08:24   MR FACE/TRIGEMINAL WO/W CM  Result Date: 06/19/2021 CLINICAL DATA:  60 year old male with nasopharyngeal mass, skull base erosion, bilateral cervical lymphadenopathy. Squamous cell carcinoma. EXAM: MRI FACE TRIGEMINAL WITHOUT AND WITH CONTRAST TECHNIQUE: Multiplanar, multi-echo pulse sequences of the face and surrounding structures, including thin-slice imaging of the trigeminal nerves, were acquired before and after intravenous contrast administration. CONTRAST:  62mL GADAVIST GADOBUTROL 1 MMOL/ML IV SOLN COMPARISON:  CT neck 05/25/2021. MRI cervical spine 04/27/2021. FINDINGS: Bulky but fairly symmetric increased soft tissue of the nasopharynx with fairly homogeneous increased STIR signal (series 8, image 16) and enhancement (series 12, image 17). Outside of the skull base this measures up to 2 cm in thickness (series 12, image 16). However, there is extensive invasion of the midline bony skull base including the clivus (series 5, image 14) with associated enhancement of the invasive tumor into the floor of the sphenoid sinus, and all the  way to the floor of the sella turcica. In the transverse plane this involves the central roughly 3.9 cm of the skull base, with margins with more normal marrow signal visible on series 10, image 28. The occipital condyles are spared. The visible cervical vertebrae are spared. Partially visible prior cervical ACDF beginning at C3. Brainstem and cisternal 5th nerve segments are within normal limits. Bilateral Meckel's cave remain normal. Bilateral V3 trunks and inferior alveolar nerves remain symmetric and within normal limits. The V2 trunks remain within normal limits, although the left is more easily identified following contrast on series 3, image 23. The infraorbital nerves are symmetric and within normal limits. And there is no intraorbital mass or abnormal enhancement identified. No dural thickening is identified at the skull base (CSF pulsation artifact in the prepontine cistern on several sequences). The cavernous sinus remains symmetric. Major intracranial vascular flow voids are preserved. Mild sphenoid sinus mucosal thickening is greater on the left and appears to be reactive. The nasal cavity appears to be spared. Mastoids are clear. Visible internal auditory structures appear normal. Visible salivary glands and superficial face soft tissues are within normal limits. Partially visible enlarged and heterogeneous bilateral level 2A cervical lymph nodes are stable since last month, best on series 8, images 30 (13 mm on the left) and 28 (12 mm on the right). Smaller visible bilateral retropharyngeal, level 1 and other level 2 lymph nodes appear stable. IMPRESSION: 1. Extensive tumor invasion of the skull base, throughout an area of roughly 4 cm in the midline including the entire clivus, posterior sphenoid sinuses, and the floor of the bony sella. 2. But no Trigeminal or other distinct cranial nerve involvement is identified. And no intracranial extension is evident. 3. Primary bulky, roughly 2 cm thick  bilateral nasopharyngeal soft tissue mass and visible abnormal bilateral level 2 nodes are stable from the CT last month. Electronically Signed   By: Genevie Ann M.D.   On: 06/19/2021 15:53    Labs:  CBC: Recent Labs    01/12/21 1113 03/04/21 0935 05/24/21 0953 06/20/21 1140  WBC 8.1 7.4 7.8 7.5  HGB 14.4 14.5 14.1 14.8  HCT 43.0 44.7 43.0 45.1  PLT 286 242 234 267  COAGS: No results for input(s): INR, APTT in the last 8760 hours.  BMP: Recent Labs    09/29/20 1004 01/12/21 1113 03/04/21 0935 05/24/21 0953 06/20/21 1140  NA 143 143 141 139 139  K 5.1 4.9 4.4 4.7 4.7  CL 101 107* 102 103 102  CO2 29 24 33* 31 31  GLUCOSE 133* 139* 143* 145* 188*  BUN 20 22 27* 18 26*  CALCIUM 9.8 9.1 9.2 9.0 10.0  CREATININE 0.86 1.08 0.82 0.86 0.75  GFRNONAA 94  --  >60 >60 >60  GFRAA 108  --   --   --   --     LIVER FUNCTION TESTS: Recent Labs    09/29/20 1004 01/12/21 1113 05/24/21 0953 06/20/21 1140  BILITOT 0.9 0.6 1.0 1.0  AST 20 12 11* 13*  ALT 15 16 10 11   ALKPHOS 59 72 52 48  PROT 7.2 7.0 7.1 7.7  ALBUMIN 4.6 4.3 4.2 4.5    TUMOR MARKERS: No results for input(s): AFPTM, CEA, CA199, CHROMGRNA in the last 8760 hours.  Assessment and Plan: 60 y.o. male, ex-smoker, with history of anxiety/depression, COPD, hypertension, GERD, childhood ITP, hypothyroidism, osteoarthritis, diabetes and now with newly diagnosed nasopharyngeal carcinoma.  He has poor venous access and presents today for Port-A-Cath placement to assist with chemotherapy administration.Risks and benefits of image guided port-a-catheter placement was discussed with the patient including, but not limited to bleeding, infection, pneumothorax, or fibrin sheath development and need for additional procedures.  All of the patient's questions were answered, patient is agreeable to proceed. Consent signed and in chart.    Thank you for this interesting consult.  I greatly enjoyed meeting Dustin [name] and look  forward to participating in their care.  A copy of this report was sent to the requesting provider on this date.  Electronically Signed: D. Rowe Robert, PA-C 06/27/2021, 12:42 PM   I spent a total of  25 minutes   in face to face in clinical consultation, greater than 50% of which was counseling/coordinating care for Port-A-Cath placement

## 2021-06-27 NOTE — Sedation Documentation (Signed)
Patient is resting comfortably, pleasantly conversing with Dr Kathlene Cote.

## 2021-06-27 NOTE — Procedures (Signed)
Interventional Radiology Procedure Note  Procedure: Single Lumen Power Port Placement    Access:  Right IJ vein.  Findings: Catheter tip positioned at SVC/RA junction. Port is ready for immediate use.   Complications: None  EBL: < 10 mL  Recommendations:  - Ok to shower in 24 hours - Do not submerge for 7 days - Routine line care   Javarious Elsayed T. Demyan Fugate, M.D Pager:  [phone]   

## 2021-06-29 ENCOUNTER — Inpatient Hospital Stay: Payer: BC Managed Care – PPO

## 2021-06-29 ENCOUNTER — Other Ambulatory Visit: Payer: Self-pay

## 2021-06-29 ENCOUNTER — Other Ambulatory Visit: Payer: Self-pay | Admitting: Oncology

## 2021-06-29 ENCOUNTER — Other Ambulatory Visit: Payer: BC Managed Care – PPO

## 2021-06-29 ENCOUNTER — Other Ambulatory Visit: Payer: Self-pay | Admitting: Nurse Practitioner

## 2021-06-29 DIAGNOSIS — C119 Malignant neoplasm of nasopharynx, unspecified: Secondary | ICD-10-CM

## 2021-06-29 DIAGNOSIS — Z95828 Presence of other vascular implants and grafts: Secondary | ICD-10-CM

## 2021-06-29 DIAGNOSIS — Z79899 Other long term (current) drug therapy: Secondary | ICD-10-CM | POA: Diagnosis not present

## 2021-06-29 DIAGNOSIS — E039 Hypothyroidism, unspecified: Secondary | ICD-10-CM | POA: Diagnosis not present

## 2021-06-29 DIAGNOSIS — J449 Chronic obstructive pulmonary disease, unspecified: Secondary | ICD-10-CM | POA: Diagnosis not present

## 2021-06-29 DIAGNOSIS — M069 Rheumatoid arthritis, unspecified: Secondary | ICD-10-CM | POA: Diagnosis not present

## 2021-06-29 DIAGNOSIS — Z5111 Encounter for antineoplastic chemotherapy: Secondary | ICD-10-CM | POA: Diagnosis not present

## 2021-06-29 DIAGNOSIS — E119 Type 2 diabetes mellitus without complications: Secondary | ICD-10-CM | POA: Diagnosis not present

## 2021-06-29 LAB — CMP (CANCER CENTER ONLY)
ALT: 10 U/L (ref 0–44)
AST: 12 U/L — ABNORMAL LOW (ref 15–41)
Albumin: 4.3 g/dL (ref 3.5–5.0)
Alkaline Phosphatase: 46 U/L (ref 38–126)
Anion gap: 6 (ref 5–15)
BUN: 19 mg/dL (ref 8–23)
CO2: 31 mmol/L (ref 22–32)
Calcium: 9.6 mg/dL (ref 8.9–10.3)
Chloride: 102 mmol/L (ref 98–111)
Creatinine: 0.79 mg/dL (ref 0.61–1.24)
GFR, Estimated: >60 mL/min (ref >60)
Glucose, Bld: 125 mg/dL — ABNORMAL HIGH (ref 70–99)
Potassium: 4.4 mmol/L (ref 3.5–5.1)
Sodium: 139 mmol/L (ref 135–145)
Total Bilirubin: 0.6 mg/dL (ref 0.3–1.2)
Total Protein: 7.2 g/dL (ref 6.5–8.1)

## 2021-06-29 LAB — CBC WITH DIFFERENTIAL (CANCER CENTER ONLY)
Abs Immature Granulocytes: 0.03 10*3/uL (ref 0.00–0.07)
Basophils Absolute: 0 10*3/uL (ref 0.0–0.1)
Basophils Relative: 1 %
Eosinophils Absolute: 0.1 10*3/uL (ref 0.0–0.5)
Eosinophils Relative: 2 %
HCT: 41.4 % (ref 39.0–52.0)
Hemoglobin: 13.8 g/dL (ref 13.0–17.0)
Immature Granulocytes: 0 %
Lymphocytes Relative: 22 %
Lymphs Abs: 1.8 10*3/uL (ref 0.7–4.0)
MCH: 32.9 pg (ref 26.0–34.0)
MCHC: 33.3 g/dL (ref 30.0–36.0)
MCV: 98.8 fL (ref 80.0–100.0)
Monocytes Absolute: 0.6 10*3/uL (ref 0.1–1.0)
Monocytes Relative: 8 %
Neutro Abs: 5.6 10*3/uL (ref 1.7–7.7)
Neutrophils Relative %: 67 %
Platelet Count: 280 10*3/uL (ref 150–400)
RBC: 4.19 MIL/uL — ABNORMAL LOW (ref 4.22–5.81)
RDW: 12.3 % (ref 11.5–15.5)
WBC Count: 8.2 10*3/uL (ref 4.0–10.5)
nRBC: 0 % (ref 0.0–0.2)

## 2021-06-29 LAB — MAGNESIUM: Magnesium: 1.9 mg/dL (ref 1.7–2.4)

## 2021-06-29 MED ORDER — SODIUM CHLORIDE 0.9% FLUSH
10.0000 mL | Freq: Once | INTRAVENOUS | Status: AC
  Administered 2021-06-29: 10 mL via INTRAVENOUS

## 2021-06-29 MED ORDER — OXYCODONE HCL 5 MG PO TABS: 2.5000 mg | ORAL_TABLET | Freq: Three times a day (TID) | ORAL | 0 refills | Status: DC | PRN

## 2021-06-29 MED ORDER — HEPARIN SOD (PORK) LOCK FLUSH 100 UNIT/ML IV SOLN
500.0000 [IU] | Freq: Once | INTRAVENOUS | Status: AC
  Administered 2021-06-29: 500 [IU] via INTRAVENOUS

## 2021-06-29 NOTE — Patient Instructions (Signed)
Implanted Port Home Guide An implanted port is a device that is placed under the skin. It is usually placed in the chest. The device can be used to give IV medicine, to take blood, or for dialysis. You may have an implanted port if: You need IV medicine that would be irritating to the small veins in your hands or arms. You need IV medicines, such as antibiotics, for a long period of time. You need IV nutrition for a long period of time. You need dialysis. When you have a port, your health care provider can choose to use the port instead of veins in your arms for these procedures. You may have fewer limitations when using a port than you would if you used other types of long-term IVs, and you will likely be able to return to normal activities after your incision heals. An implanted port has two main parts: Reservoir. The reservoir is the part where a needle is inserted to give medicines or draw blood. The reservoir is round. After it is placed, it appears as a small, raised area under your skin. Catheter. The catheter is a thin, flexible tube that connects the reservoir to a vein. Medicine that is inserted into the reservoir goes into the catheter and then into the vein. How is my port accessed? To access your port: A numbing cream may be placed on the skin over the port site. Your health care provider will put on a mask and sterile gloves. The skin over your port will be cleaned carefully with a germ-killing soap and allowed to dry. Your health care provider will gently pinch the port and insert a needle into it. Your health care provider will check for a blood return to make sure the port is in the vein and is not clogged. If your port needs to remain accessed to get medicine continuously (constant infusion), your health care provider will place a clear bandage (dressing) over the needle site. The dressing and needle will need to be changed every week, or as told by your health care provider. What  is flushing? Flushing helps keep the port from getting clogged. Follow instructions from your health care provider about how and when to flush the port. Ports are usually flushed with saline solution or a medicine called heparin. The need for flushing will depend on how the port is used: If the port is only used from time to time to give medicines or draw blood, the port may need to be flushed: Before and after medicines have been given. Before and after blood has been drawn. As part of routine maintenance. Flushing may be recommended every 4-6 weeks. If a constant infusion is running, the port may not need to be flushed. Throw away any syringes in a disposal container that is meant for sharp items (sharps container). You can buy a sharps container from a pharmacy, or you can make one by using an empty hard plastic bottle with a cover. How long will my port stay implanted? The port can stay in for as long as your health care provider thinks it is needed. When it is time for the port to come out, a surgery will be done to remove it. The surgery will be similar to the procedure that was done to put the port in. Follow these instructions at home:  Flush your port as told by your health care provider. If you need an infusion over several days, follow instructions from your health care provider about how   to take care of your port site. Make sure you: Wash your hands with soap and water before you change your dressing. If soap and water are not available, use alcohol-based hand sanitizer. Change your dressing as told by your health care provider. Place any used dressings or infusion bags into a plastic bag. Throw that bag in the trash. Keep the dressing that covers the needle clean and dry. Do not get it wet. Do not use scissors or sharp objects near the tube. Keep the tube clamped, unless it is being used. Check your port site every day for signs of infection. Check for: Redness, swelling, or  pain. Fluid or blood. Pus or a bad smell. Protect the skin around the port site. Avoid wearing bra straps that rub or irritate the site. Protect the skin around your port from seat belts. Place a soft pad over your chest if needed. Bathe or shower as told by your health care provider. The site may get wet as long as you are not actively receiving an infusion. Return to your normal activities as told by your health care provider. Ask your health care provider what activities are safe for you. Carry a medical alert card or wear a medical alert bracelet at all times. This will let health care providers know that you have an implanted port in case of an emergency. Get help right away if: You have redness, swelling, or pain at the port site. You have fluid or blood coming from your port site. You have pus or a bad smell coming from the port site. You have a fever. Summary Implanted ports are usually placed in the chest for long-term IV access. Follow instructions from your health care provider about flushing the port and changing bandages (dressings). Take care of the area around your port by avoiding clothing that puts pressure on the area, and by watching for signs of infection. Protect the skin around your port from seat belts. Place a soft pad over your chest if needed. Get help right away if you have a fever or you have redness, swelling, pain, drainage, or a bad smell at the port site. This information is not intended to replace advice given to you by your health care provider. Make sure you discuss any questions you have with your health care provider. Document Revised: 11/22/2020 Document Reviewed: 01/17/2020 Elsevier Patient Education  2022 [employer]  

## 2021-07-02 ENCOUNTER — Inpatient Hospital Stay: Payer: BC Managed Care – PPO

## 2021-07-02 ENCOUNTER — Inpatient Hospital Stay (HOSPITAL_BASED_OUTPATIENT_CLINIC_OR_DEPARTMENT_OTHER): Payer: BC Managed Care – PPO | Admitting: Oncology

## 2021-07-02 ENCOUNTER — Other Ambulatory Visit: Payer: Self-pay

## 2021-07-02 ENCOUNTER — Encounter: Payer: Self-pay | Admitting: *Deleted

## 2021-07-02 ENCOUNTER — Telehealth: Payer: Self-pay | Admitting: *Deleted

## 2021-07-02 VITALS — BP 125/75 | HR 72 | Temp 97.9°F | Resp 18 | Ht 73.0 in | Wt 264.0 lb

## 2021-07-02 DIAGNOSIS — C119 Malignant neoplasm of nasopharynx, unspecified: Secondary | ICD-10-CM | POA: Diagnosis not present

## 2021-07-02 DIAGNOSIS — J449 Chronic obstructive pulmonary disease, unspecified: Secondary | ICD-10-CM | POA: Diagnosis not present

## 2021-07-02 DIAGNOSIS — Z5111 Encounter for antineoplastic chemotherapy: Secondary | ICD-10-CM | POA: Diagnosis not present

## 2021-07-02 DIAGNOSIS — M069 Rheumatoid arthritis, unspecified: Secondary | ICD-10-CM | POA: Diagnosis not present

## 2021-07-02 DIAGNOSIS — E119 Type 2 diabetes mellitus without complications: Secondary | ICD-10-CM | POA: Diagnosis not present

## 2021-07-02 DIAGNOSIS — E039 Hypothyroidism, unspecified: Secondary | ICD-10-CM | POA: Diagnosis not present

## 2021-07-02 DIAGNOSIS — Z79899 Other long term (current) drug therapy: Secondary | ICD-10-CM | POA: Diagnosis not present

## 2021-07-02 NOTE — Progress Notes (Signed)
DISCONTINUE ON PATHWAY REGIMEN - Head and Neck   Cisplatin 80 mg/m2 IV D1 + Gemcitabine 1,000 mg/m2 IV D1,8 q21 Days:   A cycle is every 21 days:     Gemcitabine      Cisplatin    Cisplatin 40 mg/m2 IV D1 q7 Days + RT:   A cycle is every 7 days:     Cisplatin   **Always confirm dose/schedule in your pharmacy ordering system**  REASON: Other Reason PRIOR TREATMENT: [id number]: Gemcitabine 1,000 mg/m2 D1, 8 + Cisplatin 80 mg/m2 D1 q21 Days x 3 Cycles, Followed by Cisplatin 40 mg/m2 q7 Days Concurrent with Radiation TREATMENT RESPONSE: Unable to Evaluate  START OFF PATHWAY REGIMEN - Head and Neck   [id number]:Fluorouracil + Carboplatin q21 Days:   A cycle is every 21 days:     Carboplatin      Fluorouracil   **Always confirm dose/schedule in your pharmacy ordering system**  Patient Characteristics: Nasopharyngeal, Stage II - IVA Disease Classification: Nasopharyngeal Current Disease Status: No Distant Metastases and No Recurrent Disease AJCC T Category: T3 AJCC N Category: N2 AJCC M Category: M0 AJCC 8 Stage Grouping: III Intent of Therapy: Curative Intent, Discussed with Patient

## 2021-07-02 NOTE — Progress Notes (Signed)
[facility] OFFICE PROGRESS NOTE   Diagnosis: Nasopharynx carcinoma  INTERVAL HISTORY:   Dustin [name] is scheduled to begin chemotherapy today.  He underwent audiology testing on 06/22/2021.  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  He reports chronic tinnitus and diminished hearing.  He continues to have headaches.  He is taking Percocet and ibuprofen as needed.  Objective:  Vital signs in last 24 hours:  Blood pressure 125/75, pulse 72, temperature 97.9 F (36.6 C), temperature source Oral, resp. rate 18, height 6\' 1"  (1.854 m), weight 264 lb (119.7 kg), SpO2 98 %.    Lymphatics: 2 cm firm left anterior cervical node Resp: Lungs clear bilaterally Cardio: Regular rate and rhythm GI: No hepatosplenomegaly, nontender, no mass Vascular: No leg edema  Portacath/PICC-with a resolving surrounding ecchymosis  Lab Results:  Lab Results  Component Value Date   WBC 8.2 06/29/2021   HGB 13.8 06/29/2021   HCT 41.4 06/29/2021   MCV 98.8 06/29/2021   PLT 280 06/29/2021   NEUTROABS 5.6 06/29/2021    CMP  Lab Results  Component Value Date   NA 139 06/29/2021   K 4.4 06/29/2021   CL 102 06/29/2021   CO2 31 06/29/2021   GLUCOSE 125 (H) 06/29/2021   BUN 19 06/29/2021   CREATININE 0.79 06/29/2021   CALCIUM 9.6 06/29/2021   PROT 7.2 06/29/2021   ALBUMIN 4.3 06/29/2021   AST 12 (L) 06/29/2021   ALT 10 06/29/2021   ALKPHOS 46 06/29/2021   BILITOT 0.6 06/29/2021   GFRNONAA >60 06/29/2021   GFRAA 108 09/29/2020    Medications: I have reviewed the patient's current medications.   Assessment/Plan: Nasopharyngeal cancer, presenting with left neck adenopathy MRI cervical spine 04/27/2021-bilateral enlarged upper jugular chain lymph nodes.  Right level 2 node measures 18 mm CT neck 05/25/2021-bilateral nasopharyngeal mass eroding the skull base at the level of the clivus and inferior sphenoid sinus, enlarged right jugular  digastric node into enlarged left level 2 nodes, mild soft tissue density in the inferior left sphenoid sinus-tumor? 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  He has been diagnosed with locally advanced nasopharyngeal carcinoma.  The plan was to begin induction therapy with gemcitabine/cisplatin today.  However he was noted to have severe high-frequency hearing loss on an audiology evaluation.  I discussed the potential for worsened and severe hearing loss if he were to receive cisplatin chemotherapy.  I do not recommend cisplatin in his case.  We discussed alternate treatment options.  He understands standard regimens for induction therapy in nasopharyngeal carcinoma include cisplatin chemotherapy.  There is limited data with other agents including 5-FU/carboplatin.  I discussed the case with Dr. Isidore Moos last week.  The plan is to initiate induction therapy with 5-FU/carboplatin while waiting on radiation planning.  If Dr. Isidore Moos can begin radiation within the next week the plan will be for weekly carboplatin and concurrent radiation.  I reviewed potential toxicities associated with the 5-FU/carboplatin regimen including the chance for nausea, mucositis, diarrhea, hyperpigmentation, rash, sun sensitivity, hand/foot syndrome,  and hematologic toxicity.  He agrees to proceed.  He will be scheduled to begin treatment on 07/06/2021.  A chemotherapy plan was entered today.  [name], MD  07/02/2021  8:41 AM

## 2021-07-04 ENCOUNTER — Telehealth: Payer: Self-pay | Admitting: *Deleted

## 2021-07-05 ENCOUNTER — Other Ambulatory Visit: Payer: Self-pay | Admitting: Oncology

## 2021-07-05 ENCOUNTER — Other Ambulatory Visit: Payer: Self-pay | Admitting: *Deleted

## 2021-07-05 DIAGNOSIS — C119 Malignant neoplasm of nasopharynx, unspecified: Secondary | ICD-10-CM

## 2021-07-05 NOTE — Progress Notes (Signed)
Pharmacist Chemotherapy Monitoring - Initial Assessment    Anticipated start date: 07/06/21   The following has been reviewed per standard work regarding the patient's treatment regimen: The patient's diagnosis, treatment plan and drug doses, and organ/hematologic function Lab orders and baseline tests specific to treatment regimen  The treatment plan start date, drug sequencing, and pre-medications Prior authorization status  Patient's documented medication list, including drug-drug interaction screen and prescriptions for anti-emetics and supportive care specific to the treatment regimen The drug concentrations, fluid compatibility, administration routes, and timing of the medications to be used The patient's access for treatment and lifetime cumulative dose history, if applicable  The patient's medication allergies and previous infusion related reactions, if applicable   Changes made to treatment plan:  N/A  Follow up needed:  N/A   Patrica Duel, [facility], 07/05/2021  11:27 AM

## 2021-07-05 NOTE — Progress Notes (Signed)
DISCONTINUE OFF PATHWAY REGIMEN - Head and Neck   [id number]:Fluorouracil + Carboplatin q21 Days:   A cycle is every 21 days:     Carboplatin      Fluorouracil   **Always confirm dose/schedule in your pharmacy ordering system**  REASON: Other Reason PRIOR TREATMENT: Off Pathway: Fluorouracil + Carboplatin q21 Days TREATMENT RESPONSE: Unable to Evaluate  START OFF PATHWAY REGIMEN - Head and Neck   [id number]:Carboplatin + Gemcitabine (5/1,000) q21 Days:   A cycle is every 21 days:     Carboplatin      Gemcitabine   **Always confirm dose/schedule in your pharmacy ordering system**  Patient Characteristics: Nasopharyngeal, Stage II - IVA Disease Classification: Nasopharyngeal Current Disease Status: No Distant Metastases and No Recurrent Disease AJCC T Category: T3 AJCC N Category: N2 AJCC M Category: M0 AJCC 8 Stage Grouping: III Intent of Therapy: Curative Intent, Discussed with Patient

## 2021-07-05 NOTE — Progress Notes (Signed)
Per Dr. Benay Spice: scheduling message sent for lab/OV/chemo on 10/28

## 2021-07-06 ENCOUNTER — Inpatient Hospital Stay: Payer: BC Managed Care – PPO

## 2021-07-06 ENCOUNTER — Ambulatory Visit: Payer: BC Managed Care – PPO | Admitting: Radiation Oncology

## 2021-07-06 ENCOUNTER — Other Ambulatory Visit: Payer: BC Managed Care – PPO

## 2021-07-06 ENCOUNTER — Ambulatory Visit: Payer: BC Managed Care – PPO

## 2021-07-06 ENCOUNTER — Other Ambulatory Visit: Payer: Self-pay

## 2021-07-06 ENCOUNTER — Encounter: Payer: Self-pay | Admitting: Audiologist

## 2021-07-06 ENCOUNTER — Ambulatory Visit: Payer: BC Managed Care – PPO | Admitting: Rheumatology

## 2021-07-06 ENCOUNTER — Encounter: Payer: Self-pay | Admitting: Oncology

## 2021-07-06 VITALS — BP 113/74 | HR 89 | Temp 96.9°F | Resp 18 | Ht 73.0 in | Wt 263.8 lb

## 2021-07-06 DIAGNOSIS — M069 Rheumatoid arthritis, unspecified: Secondary | ICD-10-CM | POA: Diagnosis not present

## 2021-07-06 DIAGNOSIS — J449 Chronic obstructive pulmonary disease, unspecified: Secondary | ICD-10-CM | POA: Diagnosis not present

## 2021-07-06 DIAGNOSIS — Z5111 Encounter for antineoplastic chemotherapy: Secondary | ICD-10-CM | POA: Diagnosis not present

## 2021-07-06 DIAGNOSIS — C119 Malignant neoplasm of nasopharynx, unspecified: Secondary | ICD-10-CM | POA: Diagnosis not present

## 2021-07-06 DIAGNOSIS — Z79899 Other long term (current) drug therapy: Secondary | ICD-10-CM | POA: Diagnosis not present

## 2021-07-06 DIAGNOSIS — E119 Type 2 diabetes mellitus without complications: Secondary | ICD-10-CM | POA: Diagnosis not present

## 2021-07-06 DIAGNOSIS — E039 Hypothyroidism, unspecified: Secondary | ICD-10-CM | POA: Diagnosis not present

## 2021-07-06 MED ORDER — SODIUM CHLORIDE 0.9 % IV SOLN
10.0000 mg | Freq: Once | INTRAVENOUS | Status: AC
  Administered 2021-07-06: 10 mg via INTRAVENOUS
  Filled 2021-07-06: qty 1

## 2021-07-06 MED ORDER — SODIUM CHLORIDE 0.9% FLUSH
10.0000 mL | INTRAVENOUS | Status: DC | PRN
  Administered 2021-07-06 (×2): 10 mL

## 2021-07-06 MED ORDER — CARBOPLATIN CHEMO INJECTION 600 MG/60ML
500.0000 mg | Freq: Once | INTRAVENOUS | Status: AC
  Administered 2021-07-06: 500 mg via INTRAVENOUS
  Filled 2021-07-06: qty 50

## 2021-07-06 MED ORDER — SODIUM CHLORIDE 0.9 % IV SOLN
1000.0000 mg/m2 | Freq: Once | INTRAVENOUS | Status: AC
  Administered 2021-07-06: 2470 mg via INTRAVENOUS
  Filled 2021-07-06: qty 52.6

## 2021-07-06 MED ORDER — SODIUM CHLORIDE 0.9 % IV SOLN
150.0000 mg | Freq: Once | INTRAVENOUS | Status: AC
  Administered 2021-07-06: 150 mg via INTRAVENOUS
  Filled 2021-07-06: qty 5

## 2021-07-06 MED ORDER — SODIUM CHLORIDE 0.9 % IV SOLN: Freq: Once | INTRAVENOUS | Status: AC

## 2021-07-06 MED ORDER — HEPARIN SOD (PORK) LOCK FLUSH 100 UNIT/ML IV SOLN
500.0000 [IU] | Freq: Once | INTRAVENOUS | Status: AC | PRN
Start: 2021-07-06 — End: 2021-07-06
  Administered 2021-07-06: 500 [IU]

## 2021-07-06 MED ORDER — PALONOSETRON HCL INJECTION 0.25 MG/5ML
0.2500 mg | Freq: Once | INTRAVENOUS | Status: AC
  Administered 2021-07-06: 0.25 mg via INTRAVENOUS
  Filled 2021-07-06: qty 5

## 2021-07-06 NOTE — Patient Instructions (Signed)
Dustin [name]  Discharge Instructions: Thank you for choosing [facility] to provide your oncology and hematology care.   If you have a lab appointment with the [facility], please go directly to the [facility] and check in at the registration area.   Wear comfortable clothing and clothing appropriate for easy access to any Portacath or PICC line.   We strive to give you quality time with your provider. You may need to reschedule your appointment if you arrive late (15 or more minutes).  Arriving late affects you and other patients whose appointments are after yours.  Also, if you miss three or more appointments without notifying the office, you may be dismissed from the clinic at the provider's discretion.      For prescription refill requests, have your pharmacy contact our office and allow 72 hours for refills to be completed.    Today you received the following chemotherapy and/or immunotherapy agents GEMZAR and CARBOPLATIN      To help prevent nausea and vomiting after your treatment, we encourage you to take your nausea medication as directed.  BELOW ARE SYMPTOMS THAT SHOULD BE REPORTED IMMEDIATELY: *FEVER GREATER THAN 100.4 F (38 C) OR HIGHER *CHILLS OR SWEATING *NAUSEA AND VOMITING THAT IS NOT CONTROLLED WITH YOUR NAUSEA MEDICATION *UNUSUAL SHORTNESS OF BREATH *UNUSUAL BRUISING OR BLEEDING *URINARY PROBLEMS (pain or burning when urinating, or frequent urination) *BOWEL PROBLEMS (unusual diarrhea, constipation, pain near the anus) TENDERNESS IN MOUTH AND THROAT WITH OR WITHOUT PRESENCE OF ULCERS (sore throat, sores in mouth, or a toothache) UNUSUAL RASH, SWELLING OR PAIN  UNUSUAL VAGINAL DISCHARGE OR ITCHING   Items with * indicate a potential emergency and should be followed up as soon as possible or go to the Emergency Department if any problems should occur.  Please show the CHEMOTHERAPY ALERT CARD or IMMUNOTHERAPY ALERT CARD at  check-in to the Emergency Department and triage nurse.  Should you have questions after your visit or need to cancel or reschedule your appointment, please contact [facility]  Dept: [phone]  and follow the prompts.  Office hours are 8:00 a.m. to 4:30 p.m. Monday - Friday. Please note that voicemails left after 4:00 p.m. may not be returned until the following business day.  We are closed weekends and major holidays. You have access to a nurse at all times for urgent questions. Please call the main number to the clinic Dept: [phone] and follow the prompts.   For any non-urgent questions, you may also contact your provider using MyChart. We now offer e-Visits for anyone 34 and older to request care online for non-urgent symptoms. For details visit [email or web address].   Also download the MyChart app! Go to the app store, search "MyChart", open the app, select [facility], and log in with your MyChart username and password.  Due to Covid, a mask is required upon entering the hospital/clinic. If you do not have a mask, one will be given to you upon arrival. For doctor visits, patients may have 1 support person aged 49 or older with them. For treatment visits, patients cannot have anyone with them due to current Covid guidelines and our immunocompromised population.   Gemcitabine injection What is this medication? GEMCITABINE (jem SYE ta been) is a chemotherapy drug. This medicine is used to treat many types of cancer like breast cancer, lung cancer, pancreatic cancer, and ovarian cancer. This medicine may be used for other purposes; ask your health care  provider or pharmacist if you have questions. COMMON BRAND NAME(S): Gemzar, Infugem What should I tell my care team before I take this medication? They need to know if you have any of these conditions: blood disorders infection kidney disease liver disease lung or breathing disease, like asthma recent or  ongoing radiation therapy an unusual or allergic reaction to gemcitabine, other chemotherapy, other medicines, foods, dyes, or preservatives pregnant or trying to get pregnant breast-feeding How should I use this medication? This drug is given as an infusion into a vein. It is administered in a hospital or clinic by a specially trained health care professional. Talk to your pediatrician regarding the use of this medicine in children. Special care may be needed. Overdosage: If you think you have taken too much of this medicine contact a poison control center or emergency room at once. NOTE: This medicine is only for you. Do not share this medicine with others. What if I miss a dose? It is important not to miss your dose. Call your doctor or health care professional if you are unable to keep an appointment. What may interact with this medication? medicines to increase blood counts like filgrastim, pegfilgrastim, sargramostim some other chemotherapy drugs like cisplatin vaccines Talk to your doctor or health care professional before taking any of these medicines: acetaminophen aspirin ibuprofen ketoprofen naproxen This list may not describe all possible interactions. Give your health care provider a list of all the medicines, herbs, non-prescription drugs, or dietary supplements you use. Also tell them if you smoke, drink alcohol, or use illegal drugs. Some items may interact with your medicine. What should I watch for while using this medication? Visit your doctor for checks on your progress. This drug may make you feel generally unwell. This is not uncommon, as chemotherapy can affect healthy cells as well as cancer cells. Report any side effects. Continue your course of treatment even though you feel ill unless your doctor tells you to stop. In some cases, you may be given additional medicines to help with side effects. Follow all directions for their use. Call your doctor or health care  professional for advice if you get a fever, chills or sore throat, or other symptoms of a cold or flu. Do not treat yourself. This drug decreases your body's ability to fight infections. Try to avoid being around people who are sick. This medicine may increase your risk to bruise or bleed. Call your doctor or health care professional if you notice any unusual bleeding. Be careful brushing and flossing your teeth or using a toothpick because you may get an infection or bleed more easily. If you have any dental work done, tell your dentist you are receiving this medicine. Avoid taking products that contain aspirin, acetaminophen, ibuprofen, naproxen, or ketoprofen unless instructed by your doctor. These medicines may hide a fever. Do not become pregnant while taking this medicine or for 6 months after stopping it. Women should inform their doctor if they wish to become pregnant or think they might be pregnant. Men should not father a child while taking this medicine and for 3 months after stopping it. There is a potential for serious side effects to an unborn child. Talk to your health care professional or pharmacist for more information. Do not breast-feed an infant while taking this medicine or for at least 1 week after stopping it. Men should inform their doctors if they wish to father a child. This medicine may lower sperm counts. Talk with your doctor or  health care professional if you are concerned about your fertility. What side effects may I notice from receiving this medication? Side effects that you should report to your doctor or health care professional as soon as possible: allergic reactions like skin rash, itching or hives, swelling of the face, lips, or tongue breathing problems pain, redness, or irritation at site where injected signs and symptoms of a dangerous change in heartbeat or heart rhythm like chest pain; dizziness; fast or irregular heartbeat; palpitations; feeling faint or  lightheaded, falls; breathing problems signs of decreased platelets or bleeding - bruising, pinpoint red spots on the skin, black, tarry stools, blood in the urine signs of decreased red blood cells - unusually weak or tired, feeling faint or lightheaded, falls signs of infection - fever or chills, cough, sore throat, pain or difficulty passing urine signs and symptoms of kidney injury like trouble passing urine or change in the amount of urine signs and symptoms of liver injury like dark yellow or brown urine; general ill feeling or flu-like symptoms; light-colored stools; loss of appetite; nausea; right upper belly pain; unusually weak or tired; yellowing of the eyes or skin swelling of ankles, feet, hands Side effects that usually do not require medical attention (report to your doctor or health care professional if they continue or are bothersome): constipation diarrhea hair loss loss of appetite nausea rash vomiting This list may not describe all possible side effects. Call your doctor for medical advice about side effects. You may report side effects to FDA at 1-800-FDA-1088. Where should I keep my medication? This drug is given in a hospital or clinic and will not be stored at home. NOTE: This sheet is a summary. It may not cover all possible information. If you have questions about this medicine, talk to your doctor, pharmacist, or health care provider.  2022 Elsevier/Gold Standard (2017-11-26 18:06:11)  Carboplatin injection What is this medication? CARBOPLATIN (KAR boe pla tin) is a chemotherapy drug. It targets fast dividing cells, like cancer cells, and causes these cells to die. This medicine is used to treat ovarian cancer and many other cancers. This medicine may be used for other purposes; ask your health care provider or pharmacist if you have questions. COMMON BRAND NAME(S): Paraplatin What should I tell my care team before I take this medication? They need to know if  you have any of these conditions: blood disorders hearing problems kidney disease recent or ongoing radiation therapy an unusual or allergic reaction to carboplatin, cisplatin, other chemotherapy, other medicines, foods, dyes, or preservatives pregnant or trying to get pregnant breast-feeding How should I use this medication? This drug is usually given as an infusion into a vein. It is administered in a hospital or clinic by a specially trained health care professional. Talk to your pediatrician regarding the use of this medicine in children. Special care may be needed. Overdosage: If you think you have taken too much of this medicine contact a poison control center or emergency room at once. NOTE: This medicine is only for you. Do not share this medicine with others. What if I miss a dose? It is important not to miss a dose. Call your doctor or health care professional if you are unable to keep an appointment. What may interact with this medication? medicines for seizures medicines to increase blood counts like filgrastim, pegfilgrastim, sargramostim some antibiotics like amikacin, gentamicin, neomycin, streptomycin, tobramycin vaccines Talk to your doctor or health care professional before taking any of these medicines: acetaminophen aspirin  ibuprofen ketoprofen naproxen This list may not describe all possible interactions. Give your health care provider a list of all the medicines, herbs, non-prescription drugs, or dietary supplements you use. Also tell them if you smoke, drink alcohol, or use illegal drugs. Some items may interact with your medicine. What should I watch for while using this medication? Your condition will be monitored carefully while you are receiving this medicine. You will need important blood work done while you are taking this medicine. This drug may make you feel generally unwell. This is not uncommon, as chemotherapy can affect healthy cells as well as cancer  cells. Report any side effects. Continue your course of treatment even though you feel ill unless your doctor tells you to stop. In some cases, you may be given additional medicines to help with side effects. Follow all directions for their use. Call your doctor or health care professional for advice if you get a fever, chills or sore throat, or other symptoms of a cold or flu. Do not treat yourself. This drug decreases your body's ability to fight infections. Try to avoid being around people who are sick. This medicine may increase your risk to bruise or bleed. Call your doctor or health care professional if you notice any unusual bleeding. Be careful brushing and flossing your teeth or using a toothpick because you may get an infection or bleed more easily. If you have any dental work done, tell your dentist you are receiving this medicine. Avoid taking products that contain aspirin, acetaminophen, ibuprofen, naproxen, or ketoprofen unless instructed by your doctor. These medicines may hide a fever. Do not become pregnant while taking this medicine. Women should inform their doctor if they wish to become pregnant or think they might be pregnant. There is a potential for serious side effects to an unborn child. Talk to your health care professional or pharmacist for more information. Do not breast-feed an infant while taking this medicine. What side effects may I notice from receiving this medication? Side effects that you should report to your doctor or health care professional as soon as possible: allergic reactions like skin rash, itching or hives, swelling of the face, lips, or tongue signs of infection - fever or chills, cough, sore throat, pain or difficulty passing urine signs of decreased platelets or bleeding - bruising, pinpoint red spots on the skin, black, tarry stools, nosebleeds signs of decreased red blood cells - unusually weak or tired, fainting spells, lightheadedness breathing  problems changes in hearing changes in vision chest pain high blood pressure low blood counts - This drug may decrease the number of white blood cells, red blood cells and platelets. You may be at increased risk for infections and bleeding. nausea and vomiting pain, swelling, redness or irritation at the injection site pain, tingling, numbness in the hands or feet problems with balance, talking, walking trouble passing urine or change in the amount of urine Side effects that usually do not require medical attention (report to your doctor or health care professional if they continue or are bothersome): hair loss loss of appetite metallic taste in the mouth or changes in taste This list may not describe all possible side effects. Call your doctor for medical advice about side effects. You may report side effects to FDA at 1-800-FDA-1088. Where should I keep my medication? This drug is given in a hospital or clinic and will not be stored at home. NOTE: This sheet is a summary. It may not cover all possible information.  If you have questions about this medicine, talk to your doctor, pharmacist, or health care provider.  2022 Elsevier/Gold Standard (2007-12-08 14:38:05)

## 2021-07-06 NOTE — Progress Notes (Signed)
Carboplatin dose modified by provider. Ok at 500 mg.

## 2021-07-06 NOTE — Progress Notes (Signed)
Per Arvid Right CRNP ok to use labs from 06/29/21 for today's infusion.

## 2021-07-08 ENCOUNTER — Other Ambulatory Visit: Payer: Self-pay | Admitting: Oncology

## 2021-07-09 ENCOUNTER — Encounter: Payer: Self-pay | Admitting: *Deleted

## 2021-07-09 ENCOUNTER — Inpatient Hospital Stay: Payer: BC Managed Care – PPO

## 2021-07-09 ENCOUNTER — Inpatient Hospital Stay: Payer: BC Managed Care – PPO | Admitting: Nurse Practitioner

## 2021-07-09 NOTE — Progress Notes (Signed)
PATIENT NAVIGATOR PROGRESS NOTE  Name: [name] Date: 07/09/2021 MRN: [id number]  DOB: 19-Sep-1960   Reason for visit:  First time chemo F/U call  Comments: Spoke with pt after first treatment and tolerated well, eating and drinking, no NV or diarrhea. Encouraged to call with any issues or questions. RTC this Friday for day 8     Time spent counseling/coordinating care: < 15 minutes

## 2021-07-09 NOTE — Progress Notes (Signed)
[employer] Initial Disability Claim Form faxed to [phone]. Copy sent to HIM and will provide originals back to patient at next appointment.

## 2021-07-10 ENCOUNTER — Inpatient Hospital Stay: Payer: BC Managed Care – PPO

## 2021-07-11 ENCOUNTER — Telehealth: Payer: Self-pay | Admitting: Nutrition

## 2021-07-11 ENCOUNTER — Inpatient Hospital Stay: Payer: BC Managed Care – PPO | Admitting: Nutrition

## 2021-07-11 ENCOUNTER — Encounter: Payer: BC Managed Care – PPO | Admitting: Nutrition

## 2021-07-11 NOTE — Telephone Encounter (Signed)
Nutrition follow up completed by telephone with patient. He is s/p one treatment for nasopharyngeal cancer. He reports weight on home scale was 258 pounds this morning. Weight on September 27 was 265 pounds in clinic. He has been eating normally and really hasn't had any reaction to treatment except fatigue and a headache. He denies difficulty chewing and swallowing and no nausea and vomiting. He is interested in lab work results.  Nutrition Diagnosis: Predicted sub optimal intake continues.  Intervention: Educated to increase amount of food at mid day meal. Encouraged high calorie and high protein foods. Consider oral nutrition supplements. Discouraged wt loss throughout treatment, either intentional or unintentional. Recommended continue activity as okayed by MD.  Monitoring, Evaluation, Goals: Increase calories and protein to minimize wt loss.  Next Visit: Telephone follow up in about one month.

## 2021-07-13 ENCOUNTER — Inpatient Hospital Stay: Payer: BC Managed Care – PPO

## 2021-07-13 ENCOUNTER — Other Ambulatory Visit: Payer: Self-pay

## 2021-07-13 ENCOUNTER — Telehealth: Payer: Self-pay

## 2021-07-13 ENCOUNTER — Inpatient Hospital Stay (HOSPITAL_BASED_OUTPATIENT_CLINIC_OR_DEPARTMENT_OTHER): Payer: BC Managed Care – PPO | Admitting: Oncology

## 2021-07-13 VITALS — BP 138/85 | HR 99 | Temp 98.1°F | Resp 20 | Ht 73.0 in | Wt 263.0 lb

## 2021-07-13 DIAGNOSIS — Z5111 Encounter for antineoplastic chemotherapy: Secondary | ICD-10-CM | POA: Diagnosis not present

## 2021-07-13 DIAGNOSIS — J449 Chronic obstructive pulmonary disease, unspecified: Secondary | ICD-10-CM | POA: Diagnosis not present

## 2021-07-13 DIAGNOSIS — C119 Malignant neoplasm of nasopharynx, unspecified: Secondary | ICD-10-CM | POA: Diagnosis not present

## 2021-07-13 DIAGNOSIS — Z79899 Other long term (current) drug therapy: Secondary | ICD-10-CM | POA: Diagnosis not present

## 2021-07-13 DIAGNOSIS — E119 Type 2 diabetes mellitus without complications: Secondary | ICD-10-CM | POA: Diagnosis not present

## 2021-07-13 DIAGNOSIS — E039 Hypothyroidism, unspecified: Secondary | ICD-10-CM | POA: Diagnosis not present

## 2021-07-13 DIAGNOSIS — M069 Rheumatoid arthritis, unspecified: Secondary | ICD-10-CM | POA: Diagnosis not present

## 2021-07-13 LAB — CBC WITH DIFFERENTIAL (CANCER CENTER ONLY)
Abs Immature Granulocytes: 0 10*3/uL (ref 0.00–0.07)
Basophils Absolute: 0.1 10*3/uL (ref 0.0–0.1)
Basophils Relative: 1 %
Eosinophils Absolute: 0.1 10*3/uL (ref 0.0–0.5)
Eosinophils Relative: 2 %
HCT: 39.7 % (ref 39.0–52.0)
Hemoglobin: 13.4 g/dL (ref 13.0–17.0)
Immature Granulocytes: 0 %
Lymphocytes Relative: 31 %
Lymphs Abs: 1.5 10*3/uL (ref 0.7–4.0)
MCH: 33 pg (ref 26.0–34.0)
MCHC: 33.8 g/dL (ref 30.0–36.0)
MCV: 97.8 fL (ref 80.0–100.0)
Monocytes Absolute: 0.4 10*3/uL (ref 0.1–1.0)
Monocytes Relative: 8 %
Neutro Abs: 2.8 10*3/uL (ref 1.7–7.7)
Neutrophils Relative %: 58 %
Platelet Count: 152 10*3/uL (ref 150–400)
RBC: 4.06 MIL/uL — ABNORMAL LOW (ref 4.22–5.81)
RDW: 11.8 % (ref 11.5–15.5)
WBC Count: 4.8 10*3/uL (ref 4.0–10.5)
nRBC: 0 % (ref 0.0–0.2)

## 2021-07-13 LAB — CMP (CANCER CENTER ONLY)
ALT: 22 U/L (ref 0–44)
AST: 16 U/L (ref 15–41)
Albumin: 4.1 g/dL (ref 3.5–5.0)
Alkaline Phosphatase: 49 U/L (ref 38–126)
Anion gap: 7 (ref 5–15)
BUN: 22 mg/dL (ref 8–23)
CO2: 29 mmol/L (ref 22–32)
Calcium: 9.2 mg/dL (ref 8.9–10.3)
Chloride: 99 mmol/L (ref 98–111)
Creatinine: 0.86 mg/dL (ref 0.61–1.24)
GFR, Estimated: >60 mL/min (ref >60)
Glucose, Bld: 216 mg/dL — ABNORMAL HIGH (ref 70–99)
Potassium: 4.6 mmol/L (ref 3.5–5.1)
Sodium: 135 mmol/L (ref 135–145)
Total Bilirubin: 0.8 mg/dL (ref 0.3–1.2)
Total Protein: 7 g/dL (ref 6.5–8.1)

## 2021-07-13 MED ORDER — HEPARIN SOD (PORK) LOCK FLUSH 100 UNIT/ML IV SOLN
500.0000 [IU] | Freq: Once | INTRAVENOUS | Status: AC | PRN
  Administered 2021-07-13: 500 [IU]

## 2021-07-13 MED ORDER — SODIUM CHLORIDE 0.9 % IV SOLN
1000.0000 mg/m2 | Freq: Once | INTRAVENOUS | Status: AC
  Administered 2021-07-13: 2470 mg via INTRAVENOUS
  Filled 2021-07-13: qty 52.6

## 2021-07-13 MED ORDER — OXYCODONE HCL 5 MG PO TABS: 5.0000 mg | ORAL_TABLET | Freq: Three times a day (TID) | ORAL | 0 refills | Status: DC | PRN

## 2021-07-13 MED ORDER — SODIUM CHLORIDE 0.9 % IV SOLN: Freq: Once | INTRAVENOUS | Status: AC

## 2021-07-13 MED ORDER — SODIUM CHLORIDE 0.9% FLUSH
10.0000 mL | INTRAVENOUS | Status: DC | PRN
  Administered 2021-07-13: 10 mL

## 2021-07-13 MED ORDER — PROCHLORPERAZINE MALEATE 10 MG PO TABS
10.0000 mg | ORAL_TABLET | Freq: Once | ORAL | Status: AC
  Administered 2021-07-13: 10 mg via ORAL
  Filled 2021-07-13: qty 1

## 2021-07-13 NOTE — Progress Notes (Signed)
Patient presents for treatment. RN assessment completed along with the following:  Labs/vitals reviewed - Yes, and within treatment parameters.   Weight within 10% of previous measurement - Yes Oncology Treatment Attestation completed for current therapy- Yes, on date 07/02/21 Informed consent completed and reflects current therapy/intent - Yes, on date 07/13/21             Provider progress note reviewed - Today's provider note is not yet available. I reviewed the most recent oncology provider progress note in chart dated 07/02/21. Treatment/Antibody/Supportive plan reviewed - Yes, and there are no adjustments needed for today's treatment. S&H and other orders reviewed - Yes, and there are no additional orders identified. Previous treatment date reviewed - Yes, and the appropriate amount of time has elapsed between treatments. Clinic Hand Off Received from - Danae Orleans, LPN  Patient to proceed with treatment.

## 2021-07-13 NOTE — Telephone Encounter (Signed)
Patient seen by Dr. Sherrill today ? ?Vitals are within treatment parameters. ? ?Labs reviewed by Dr. Sherrill and are within treatment parameters. ? ?Per physician team, patient is ready for treatment and there are NO modifications to the treatment plan.  ?

## 2021-07-13 NOTE — Patient Instructions (Signed)
[facility]  Discharge Instructions: Thank you for choosing [facility] to provide your oncology and hematology care.   If you have a lab appointment with the [facility], please go directly to the [facility] and check in at the registration area.   Wear comfortable clothing and clothing appropriate for easy access to any Portacath or PICC line.   We strive to give you quality time with your provider. You may need to reschedule your appointment if you arrive late (15 or more minutes).  Arriving late affects you and other patients whose appointments are after yours.  Also, if you miss three or more appointments without notifying the office, you may be dismissed from the clinic at the provider's discretion.      For prescription refill requests, have your pharmacy contact our office and allow 72 hours for refills to be completed.    Today you received the following chemotherapy and/or immunotherapy agents Gemzar      To help prevent nausea and vomiting after your treatment, we encourage you to take your nausea medication as directed.  BELOW ARE SYMPTOMS THAT SHOULD BE REPORTED IMMEDIATELY: *FEVER GREATER THAN 100.4 F (38 C) OR HIGHER *CHILLS OR SWEATING *NAUSEA AND VOMITING THAT IS NOT CONTROLLED WITH YOUR NAUSEA MEDICATION *UNUSUAL SHORTNESS OF BREATH *UNUSUAL BRUISING OR BLEEDING *URINARY PROBLEMS (pain or burning when urinating, or frequent urination) *BOWEL PROBLEMS (unusual diarrhea, constipation, pain near the anus) TENDERNESS IN MOUTH AND THROAT WITH OR WITHOUT PRESENCE OF ULCERS (sore throat, sores in mouth, or a toothache) UNUSUAL RASH, SWELLING OR PAIN  UNUSUAL VAGINAL DISCHARGE OR ITCHING   Items with * indicate a potential emergency and should be followed up as soon as possible or go to the Emergency Department if any problems should occur.  Please show the CHEMOTHERAPY ALERT CARD or IMMUNOTHERAPY ALERT CARD at check-in to the  Emergency Department and triage nurse.  Should you have questions after your visit or need to cancel or reschedule your appointment, please contact [facility]  Dept: [phone]  and follow the prompts.  Office hours are 8:00 a.m. to 4:30 p.m. Monday - Friday. Please note that voicemails left after 4:00 p.m. may not be returned until the following business day.  We are closed weekends and major holidays. You have access to a nurse at all times for urgent questions. Please call the main number to the clinic Dept: [phone] and follow the prompts.   For any non-urgent questions, you may also contact your provider using MyChart. We now offer e-Visits for anyone 53 and older to request care online for non-urgent symptoms. For details visit [email or web address].   Also download the MyChart app! Go to the app store, search "MyChart", open the app, select [facility], and log in with your MyChart username and password.  Due to Covid, a mask is required upon entering the hospital/clinic. If you do not have a mask, one will be given to you upon arrival. For doctor visits, patients may have 1 support person aged 29 or older with them. For treatment visits, patients cannot have anyone with them due to current Covid guidelines and our immunocompromised population.   Gemcitabine injection What is this medication? GEMCITABINE (jem SYE ta been) is a chemotherapy drug. This medicine is used to treat many types of cancer like breast cancer, lung cancer, pancreatic cancer, and ovarian cancer. This medicine may be used for other purposes; ask your health care provider or  pharmacist if you have questions. COMMON BRAND NAME(S): Gemzar, Infugem What should I tell my care team before I take this medication? They need to know if you have any of these conditions: blood disorders infection kidney disease liver disease lung or breathing disease, like asthma recent or ongoing radiation  therapy an unusual or allergic reaction to gemcitabine, other chemotherapy, other medicines, foods, dyes, or preservatives pregnant or trying to get pregnant breast-feeding How should I use this medication? This drug is given as an infusion into a vein. It is administered in a hospital or clinic by a specially trained health care professional. Talk to your pediatrician regarding the use of this medicine in children. Special care may be needed. Overdosage: If you think you have taken too much of this medicine contact a poison control center or emergency room at once. NOTE: This medicine is only for you. Do not share this medicine with others. What if I miss a dose? It is important not to miss your dose. Call your doctor or health care professional if you are unable to keep an appointment. What may interact with this medication? medicines to increase blood counts like filgrastim, pegfilgrastim, sargramostim some other chemotherapy drugs like cisplatin vaccines Talk to your doctor or health care professional before taking any of these medicines: acetaminophen aspirin ibuprofen ketoprofen naproxen This list may not describe all possible interactions. Give your health care provider a list of all the medicines, herbs, non-prescription drugs, or dietary supplements you use. Also tell them if you smoke, drink alcohol, or use illegal drugs. Some items may interact with your medicine. What should I watch for while using this medication? Visit your doctor for checks on your progress. This drug may make you feel generally unwell. This is not uncommon, as chemotherapy can affect healthy cells as well as cancer cells. Report any side effects. Continue your course of treatment even though you feel ill unless your doctor tells you to stop. In some cases, you may be given additional medicines to help with side effects. Follow all directions for their use. Call your doctor or health care professional for  advice if you get a fever, chills or sore throat, or other symptoms of a cold or flu. Do not treat yourself. This drug decreases your body's ability to fight infections. Try to avoid being around people who are sick. This medicine may increase your risk to bruise or bleed. Call your doctor or health care professional if you notice any unusual bleeding. Be careful brushing and flossing your teeth or using a toothpick because you may get an infection or bleed more easily. If you have any dental work done, tell your dentist you are receiving this medicine. Avoid taking products that contain aspirin, acetaminophen, ibuprofen, naproxen, or ketoprofen unless instructed by your doctor. These medicines may hide a fever. Do not become pregnant while taking this medicine or for 6 months after stopping it. Women should inform their doctor if they wish to become pregnant or think they might be pregnant. Men should not father a child while taking this medicine and for 3 months after stopping it. There is a potential for serious side effects to an unborn child. Talk to your health care professional or pharmacist for more information. Do not breast-feed an infant while taking this medicine or for at least 1 week after stopping it. Men should inform their doctors if they wish to father a child. This medicine may lower sperm counts. Talk with your doctor or health care  professional if you are concerned about your fertility. What side effects may I notice from receiving this medication? Side effects that you should report to your doctor or health care professional as soon as possible: allergic reactions like skin rash, itching or hives, swelling of the face, lips, or tongue breathing problems pain, redness, or irritation at site where injected signs and symptoms of a dangerous change in heartbeat or heart rhythm like chest pain; dizziness; fast or irregular heartbeat; palpitations; feeling faint or lightheaded, falls;  breathing problems signs of decreased platelets or bleeding - bruising, pinpoint red spots on the skin, black, tarry stools, blood in the urine signs of decreased red blood cells - unusually weak or tired, feeling faint or lightheaded, falls signs of infection - fever or chills, cough, sore throat, pain or difficulty passing urine signs and symptoms of kidney injury like trouble passing urine or change in the amount of urine signs and symptoms of liver injury like dark yellow or brown urine; general ill feeling or flu-like symptoms; light-colored stools; loss of appetite; nausea; right upper belly pain; unusually weak or tired; yellowing of the eyes or skin swelling of ankles, feet, hands Side effects that usually do not require medical attention (report to your doctor or health care professional if they continue or are bothersome): constipation diarrhea hair loss loss of appetite nausea rash vomiting This list may not describe all possible side effects. Call your doctor for medical advice about side effects. You may report side effects to FDA at 1-800-FDA-1088. Where should I keep my medication? This drug is given in a hospital or clinic and will not be stored at home. NOTE: This sheet is a summary. It may not cover all possible information. If you have questions about this medicine, talk to your doctor, pharmacist, or health care provider.  2022 [employer] Standard (2017-11-26 18:06:11)

## 2021-07-13 NOTE — Progress Notes (Signed)
  [facility] OFFICE PROGRESS NOTE   Diagnosis: Nasopharynx cancer  INTERVAL HISTORY:   [name] completed day 1 cycle 1 gemcitabine/carboplatin on 07/06/2021.  No nausea/vomiting, mouth sores, fever, or rash.  He feels well.  He continues to have headaches.  Oxycodone does not relieve the headache.  Objective:  Vital signs in last 24 hours:  Blood pressure 138/85, pulse 99, temperature 98.1 F (36.7 C), temperature source Oral, resp. rate 20, height 6\' 1"  (1.854 m), weight 263 lb (119.3 kg), SpO2 100 %.    HEENT: No thrush or ulcers Lymphatics: 1 cm left anterior cervical node Resp: Lungs clear bilaterally Cardio: Regular rate and rhythm GI: No hepatosplenomegaly Vascular: No leg edema    Portacath/PICC-without erythema  Lab Results:  Lab Results  Component Value Date   WBC 4.8 07/13/2021   HGB 13.4 07/13/2021   HCT 39.7 07/13/2021   MCV 97.8 07/13/2021   PLT 152 07/13/2021   NEUTROABS 2.8 07/13/2021    CMP  Lab Results  Component Value Date   NA 135 07/13/2021   K 4.6 07/13/2021   CL 99 07/13/2021   CO2 29 07/13/2021   GLUCOSE 216 (H) 07/13/2021   BUN 22 07/13/2021   CREATININE 0.86 07/13/2021   CALCIUM 9.2 07/13/2021   PROT 7.0 07/13/2021   ALBUMIN 4.1 07/13/2021   AST 16 07/13/2021   ALT 22 07/13/2021   ALKPHOS 49 07/13/2021   BILITOT 0.8 07/13/2021   GFRNONAA >60 07/13/2021   GFRAA 108 09/29/2020     Medications: I have reviewed the patient's current medications.   Assessment/Plan: Nasopharyngeal cancer, presenting with left neck adenopathy MRI cervical spine 04/27/2021-bilateral enlarged upper jugular chain lymph nodes.  Right level 2 node measures 18 mm CT neck 05/25/2021-bilateral nasopharyngeal mass eroding the skull base at the level of the clivus and inferior sphenoid sinus, enlarged right jugular digastric node into enlarged left level 2 nodes, mild soft tissue density in the inferior left sphenoid sinus-tumor? 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 Plata tolerated day 1 chemotherapy well.  He will complete day 8 gemcitabine today.  He will return for an office visit and cycle 2 chemotherapy in 2 weeks.  The left anterior cervical node appears smaller on exam today.  I refilled his prescription for oxycodone.  He will try an increased dose as needed.  [name], MD  07/13/2021  12:47 PM

## 2021-07-19 ENCOUNTER — Encounter: Payer: Self-pay | Admitting: *Deleted

## 2021-07-19 NOTE — Progress Notes (Signed)
Spoke with patient and he described a "full sensation in his ears, he can hear fine and I am not in any pain but I can hear every breath I take. When I lay down that feeling goes away." He states that his headaches has greatly diminished and that he only had a slight one this am and it resolved with Tylenol.  Discussed with Dr Benay Spice and it stated that it could be related to where the tumor is located and instructed patient to call if it worsens or his hearing is diminished. Pt stated understanding he RTC on 07/27/21.

## 2021-07-21 ENCOUNTER — Other Ambulatory Visit: Payer: Self-pay | Admitting: Oncology

## 2021-07-23 ENCOUNTER — Ambulatory Visit: Payer: BC Managed Care – PPO | Admitting: Audiologist

## 2021-07-23 ENCOUNTER — Other Ambulatory Visit: Payer: Self-pay

## 2021-07-23 ENCOUNTER — Encounter: Payer: Self-pay | Admitting: Oncology

## 2021-07-23 ENCOUNTER — Ambulatory Visit: Payer: Self-pay | Admitting: Oncology

## 2021-07-23 ENCOUNTER — Ambulatory Visit: Payer: Self-pay

## 2021-07-27 ENCOUNTER — Ambulatory Visit: Payer: BC Managed Care – PPO

## 2021-07-27 ENCOUNTER — Other Ambulatory Visit: Payer: Self-pay

## 2021-07-27 ENCOUNTER — Inpatient Hospital Stay: Payer: BC Managed Care – PPO | Attending: Oncology

## 2021-07-27 ENCOUNTER — Telehealth: Payer: Self-pay

## 2021-07-27 ENCOUNTER — Inpatient Hospital Stay: Payer: BC Managed Care – PPO

## 2021-07-27 ENCOUNTER — Ambulatory Visit: Payer: Self-pay | Admitting: Nurse Practitioner

## 2021-07-27 ENCOUNTER — Inpatient Hospital Stay (HOSPITAL_BASED_OUTPATIENT_CLINIC_OR_DEPARTMENT_OTHER): Payer: BC Managed Care – PPO | Admitting: Oncology

## 2021-07-27 VITALS — BP 104/72 | HR 100 | Temp 98.7°F | Resp 20 | Ht 73.0 in | Wt 264.0 lb

## 2021-07-27 DIAGNOSIS — C119 Malignant neoplasm of nasopharynx, unspecified: Secondary | ICD-10-CM

## 2021-07-27 DIAGNOSIS — Z5111 Encounter for antineoplastic chemotherapy: Secondary | ICD-10-CM | POA: Diagnosis not present

## 2021-07-27 DIAGNOSIS — Z79899 Other long term (current) drug therapy: Secondary | ICD-10-CM | POA: Insufficient documentation

## 2021-07-27 LAB — CBC WITH DIFFERENTIAL (CANCER CENTER ONLY)
Abs Immature Granulocytes: 0.02 10*3/uL (ref 0.00–0.07)
Basophils Absolute: 0 10*3/uL (ref 0.0–0.1)
Basophils Relative: 1 %
Eosinophils Absolute: 0.1 10*3/uL (ref 0.0–0.5)
Eosinophils Relative: 1 %
HCT: 37.6 % — ABNORMAL LOW (ref 39.0–52.0)
Hemoglobin: 12.4 g/dL — ABNORMAL LOW (ref 13.0–17.0)
Immature Granulocytes: 0 %
Lymphocytes Relative: 25 %
Lymphs Abs: 1.4 10*3/uL (ref 0.7–4.0)
MCH: 32.6 pg (ref 26.0–34.0)
MCHC: 33 g/dL (ref 30.0–36.0)
MCV: 98.9 fL (ref 80.0–100.0)
Monocytes Absolute: 0.7 10*3/uL (ref 0.1–1.0)
Monocytes Relative: 13 %
Neutro Abs: 3.4 10*3/uL (ref 1.7–7.7)
Neutrophils Relative %: 60 %
Platelet Count: 392 10*3/uL (ref 150–400)
RBC: 3.8 MIL/uL — ABNORMAL LOW (ref 4.22–5.81)
RDW: 12.8 % (ref 11.5–15.5)
WBC Count: 5.7 10*3/uL (ref 4.0–10.5)
nRBC: 0 % (ref 0.0–0.2)

## 2021-07-27 LAB — CMP (CANCER CENTER ONLY)
ALT: 23 U/L (ref 0–44)
AST: 17 U/L (ref 15–41)
Albumin: 4.2 g/dL (ref 3.5–5.0)
Alkaline Phosphatase: 50 U/L (ref 38–126)
Anion gap: 9 (ref 5–15)
BUN: 16 mg/dL (ref 8–23)
CO2: 27 mmol/L (ref 22–32)
Calcium: 9.4 mg/dL (ref 8.9–10.3)
Chloride: 103 mmol/L (ref 98–111)
Creatinine: 0.86 mg/dL (ref 0.61–1.24)
GFR, Estimated: >60 mL/min (ref >60)
Glucose, Bld: 184 mg/dL — ABNORMAL HIGH (ref 70–99)
Potassium: 4.3 mmol/L (ref 3.5–5.1)
Sodium: 139 mmol/L (ref 135–145)
Total Bilirubin: 0.5 mg/dL (ref 0.3–1.2)
Total Protein: 6.9 g/dL (ref 6.5–8.1)

## 2021-07-27 MED ORDER — HEPARIN SOD (PORK) LOCK FLUSH 100 UNIT/ML IV SOLN
500.0000 [IU] | Freq: Once | INTRAVENOUS | Status: AC | PRN
  Administered 2021-07-27: 500 [IU]

## 2021-07-27 MED ORDER — PALONOSETRON HCL INJECTION 0.25 MG/5ML
0.2500 mg | Freq: Once | INTRAVENOUS | Status: AC
  Administered 2021-07-27: 0.25 mg via INTRAVENOUS
  Filled 2021-07-27: qty 5

## 2021-07-27 MED ORDER — SODIUM CHLORIDE 0.9% FLUSH
10.0000 mL | INTRAVENOUS | Status: DC | PRN
  Administered 2021-07-27: 10 mL

## 2021-07-27 MED ORDER — SODIUM CHLORIDE 0.9 % IV SOLN
1000.0000 mg/m2 | Freq: Once | INTRAVENOUS | Status: AC
  Administered 2021-07-27: 2470 mg via INTRAVENOUS
  Filled 2021-07-27: qty 52.6

## 2021-07-27 MED ORDER — SODIUM CHLORIDE 0.9 % IV SOLN: Freq: Once | INTRAVENOUS | Status: AC

## 2021-07-27 MED ORDER — SODIUM CHLORIDE 0.9 % IV SOLN
150.0000 mg | Freq: Once | INTRAVENOUS | Status: AC
  Administered 2021-07-27: 150 mg via INTRAVENOUS
  Filled 2021-07-27: qty 5

## 2021-07-27 MED ORDER — SODIUM CHLORIDE 0.9 % IV SOLN
10.0000 mg | Freq: Once | INTRAVENOUS | Status: AC
  Administered 2021-07-27: 10 mg via INTRAVENOUS
  Filled 2021-07-27: qty 1

## 2021-07-27 MED ORDER — SODIUM CHLORIDE 0.9 % IV SOLN
500.0000 mg | Freq: Once | INTRAVENOUS | Status: AC
  Administered 2021-07-27: 500 mg via INTRAVENOUS
  Filled 2021-07-27: qty 50

## 2021-07-27 NOTE — Patient Instructions (Signed)
[facility]  Discharge Instructions: Thank you for choosing [facility] to provide your oncology and hematology care.   If you have a lab appointment with the [facility], please go directly to the [facility] and check in at the registration area.   Wear comfortable clothing and clothing appropriate for easy access to any Portacath or PICC line.   We strive to give you quality time with your provider. You may need to reschedule your appointment if you arrive late (15 or more minutes).  Arriving late affects you and other patients whose appointments are after yours.  Also, if you miss three or more appointments without notifying the office, you may be dismissed from the clinic at the provider's discretion.      For prescription refill requests, have your pharmacy contact our office and allow 72 hours for refills to be completed.    Today you received the following chemotherapy and/or immunotherapy agents Gemzar, Carboplatin      To help prevent nausea and vomiting after your treatment, we encourage you to take your nausea medication as directed.  BELOW ARE SYMPTOMS THAT SHOULD BE REPORTED IMMEDIATELY: *FEVER GREATER THAN 100.4 F (38 C) OR HIGHER *CHILLS OR SWEATING *NAUSEA AND VOMITING THAT IS NOT CONTROLLED WITH YOUR NAUSEA MEDICATION *UNUSUAL SHORTNESS OF BREATH *UNUSUAL BRUISING OR BLEEDING *URINARY PROBLEMS (pain or burning when urinating, or frequent urination) *BOWEL PROBLEMS (unusual diarrhea, constipation, pain near the anus) TENDERNESS IN MOUTH AND THROAT WITH OR WITHOUT PRESENCE OF ULCERS (sore throat, sores in mouth, or a toothache) UNUSUAL RASH, SWELLING OR PAIN  UNUSUAL VAGINAL DISCHARGE OR ITCHING   Items with * indicate a potential emergency and should be followed up as soon as possible or go to the Emergency Department if any problems should occur.  Please show the CHEMOTHERAPY ALERT CARD or IMMUNOTHERAPY ALERT CARD at  check-in to the Emergency Department and triage nurse.  Should you have questions after your visit or need to cancel or reschedule your appointment, please contact [facility]  Dept: [phone]  and follow the prompts.  Office hours are 8:00 a.m. to 4:30 p.m. Monday - Friday. Please note that voicemails left after 4:00 p.m. may not be returned until the following business day.  We are closed weekends and major holidays. You have access to a nurse at all times for urgent questions. Please call the main number to the clinic Dept: [phone] and follow the prompts.   For any non-urgent questions, you may also contact your provider using MyChart. We now offer e-Visits for anyone 25 and older to request care online for non-urgent symptoms. For details visit [email or web address].   Also download the MyChart app! Go to the app store, search "MyChart", open the app, select [facility], and log in with your MyChart username and password.  Due to Covid, a mask is required upon entering the hospital/clinic. If you do not have a mask, one will be given to you upon arrival. For doctor visits, patients may have 1 support person aged 93 or older with them. For treatment visits, patients cannot have anyone with them due to current Covid guidelines and our immunocompromised population.   Gemcitabine injection What is this medication? GEMCITABINE (jem SYE ta been) is a chemotherapy drug. This medicine is used to treat many types of cancer like breast cancer, lung cancer, pancreatic cancer, and ovarian cancer. This medicine may be used for other purposes; ask your health care provider  or pharmacist if you have questions. COMMON BRAND NAME(S): Gemzar, Infugem What should I tell my care team before I take this medication? They need to know if you have any of these conditions: blood disorders infection kidney disease liver disease lung or breathing disease, like asthma recent or  ongoing radiation therapy an unusual or allergic reaction to gemcitabine, other chemotherapy, other medicines, foods, dyes, or preservatives pregnant or trying to get pregnant breast-feeding How should I use this medication? This drug is given as an infusion into a vein. It is administered in a hospital or clinic by a specially trained health care professional. Talk to your pediatrician regarding the use of this medicine in children. Special care may be needed. Overdosage: If you think you have taken too much of this medicine contact a poison control center or emergency room at once. NOTE: This medicine is only for you. Do not share this medicine with others. What if I miss a dose? It is important not to miss your dose. Call your doctor or health care professional if you are unable to keep an appointment. What may interact with this medication? medicines to increase blood counts like filgrastim, pegfilgrastim, sargramostim some other chemotherapy drugs like cisplatin vaccines Talk to your doctor or health care professional before taking any of these medicines: acetaminophen aspirin ibuprofen ketoprofen naproxen This list may not describe all possible interactions. Give your health care provider a list of all the medicines, herbs, non-prescription drugs, or dietary supplements you use. Also tell them if you smoke, drink alcohol, or use illegal drugs. Some items may interact with your medicine. What should I watch for while using this medication? Visit your doctor for checks on your progress. This drug may make you feel generally unwell. This is not uncommon, as chemotherapy can affect healthy cells as well as cancer cells. Report any side effects. Continue your course of treatment even though you feel ill unless your doctor tells you to stop. In some cases, you may be given additional medicines to help with side effects. Follow all directions for their use. Call your doctor or health care  professional for advice if you get a fever, chills or sore throat, or other symptoms of a cold or flu. Do not treat yourself. This drug decreases your body's ability to fight infections. Try to avoid being around people who are sick. This medicine may increase your risk to bruise or bleed. Call your doctor or health care professional if you notice any unusual bleeding. Be careful brushing and flossing your teeth or using a toothpick because you may get an infection or bleed more easily. If you have any dental work done, tell your dentist you are receiving this medicine. Avoid taking products that contain aspirin, acetaminophen, ibuprofen, naproxen, or ketoprofen unless instructed by your doctor. These medicines may hide a fever. Do not become pregnant while taking this medicine or for 6 months after stopping it. Women should inform their doctor if they wish to become pregnant or think they might be pregnant. Men should not father a child while taking this medicine and for 3 months after stopping it. There is a potential for serious side effects to an unborn child. Talk to your health care professional or pharmacist for more information. Do not breast-feed an infant while taking this medicine or for at least 1 week after stopping it. Men should inform their doctors if they wish to father a child. This medicine may lower sperm counts. Talk with your doctor or health  care professional if you are concerned about your fertility. What side effects may I notice from receiving this medication? Side effects that you should report to your doctor or health care professional as soon as possible: allergic reactions like skin rash, itching or hives, swelling of the face, lips, or tongue breathing problems pain, redness, or irritation at site where injected signs and symptoms of a dangerous change in heartbeat or heart rhythm like chest pain; dizziness; fast or irregular heartbeat; palpitations; feeling faint or  lightheaded, falls; breathing problems signs of decreased platelets or bleeding - bruising, pinpoint red spots on the skin, black, tarry stools, blood in the urine signs of decreased red blood cells - unusually weak or tired, feeling faint or lightheaded, falls signs of infection - fever or chills, cough, sore throat, pain or difficulty passing urine signs and symptoms of kidney injury like trouble passing urine or change in the amount of urine signs and symptoms of liver injury like dark yellow or brown urine; general ill feeling or flu-like symptoms; light-colored stools; loss of appetite; nausea; right upper belly pain; unusually weak or tired; yellowing of the eyes or skin swelling of ankles, feet, hands Side effects that usually do not require medical attention (report to your doctor or health care professional if they continue or are bothersome): constipation diarrhea hair loss loss of appetite nausea rash vomiting This list may not describe all possible side effects. Call your doctor for medical advice about side effects. You may report side effects to FDA at 1-800-FDA-1088. Where should I keep my medication? This drug is given in a hospital or clinic and will not be stored at home. NOTE: This sheet is a summary. It may not cover all possible information. If you have questions about this medicine, talk to your doctor, pharmacist, or health care provider.  2022 Elsevier/Gold Standard (2017-11-26 18:06:11)  Carboplatin injection What is this medication? CARBOPLATIN (KAR boe pla tin) is a chemotherapy drug. It targets fast dividing cells, like cancer cells, and causes these cells to die. This medicine is used to treat ovarian cancer and many other cancers. This medicine may be used for other purposes; ask your health care provider or pharmacist if you have questions. COMMON BRAND NAME(S): Paraplatin What should I tell my care team before I take this medication? They need to know if  you have any of these conditions: blood disorders hearing problems kidney disease recent or ongoing radiation therapy an unusual or allergic reaction to carboplatin, cisplatin, other chemotherapy, other medicines, foods, dyes, or preservatives pregnant or trying to get pregnant breast-feeding How should I use this medication? This drug is usually given as an infusion into a vein. It is administered in a hospital or clinic by a specially trained health care professional. Talk to your pediatrician regarding the use of this medicine in children. Special care may be needed. Overdosage: If you think you have taken too much of this medicine contact a poison control center or emergency room at once. NOTE: This medicine is only for you. Do not share this medicine with others. What if I miss a dose? It is important not to miss a dose. Call your doctor or health care professional if you are unable to keep an appointment. What may interact with this medication? medicines for seizures medicines to increase blood counts like filgrastim, pegfilgrastim, sargramostim some antibiotics like amikacin, gentamicin, neomycin, streptomycin, tobramycin vaccines Talk to your doctor or health care professional before taking any of these medicines: acetaminophen aspirin ibuprofen  ketoprofen naproxen This list may not describe all possible interactions. Give your health care provider a list of all the medicines, herbs, non-prescription drugs, or dietary supplements you use. Also tell them if you smoke, drink alcohol, or use illegal drugs. Some items may interact with your medicine. What should I watch for while using this medication? Your condition will be monitored carefully while you are receiving this medicine. You will need important blood work done while you are taking this medicine. This drug may make you feel generally unwell. This is not uncommon, as chemotherapy can affect healthy cells as well as cancer  cells. Report any side effects. Continue your course of treatment even though you feel ill unless your doctor tells you to stop. In some cases, you may be given additional medicines to help with side effects. Follow all directions for their use. Call your doctor or health care professional for advice if you get a fever, chills or sore throat, or other symptoms of a cold or flu. Do not treat yourself. This drug decreases your body's ability to fight infections. Try to avoid being around people who are sick. This medicine may increase your risk to bruise or bleed. Call your doctor or health care professional if you notice any unusual bleeding. Be careful brushing and flossing your teeth or using a toothpick because you may get an infection or bleed more easily. If you have any dental work done, tell your dentist you are receiving this medicine. Avoid taking products that contain aspirin, acetaminophen, ibuprofen, naproxen, or ketoprofen unless instructed by your doctor. These medicines may hide a fever. Do not become pregnant while taking this medicine. Women should inform their doctor if they wish to become pregnant or think they might be pregnant. There is a potential for serious side effects to an unborn child. Talk to your health care professional or pharmacist for more information. Do not breast-feed an infant while taking this medicine. What side effects may I notice from receiving this medication? Side effects that you should report to your doctor or health care professional as soon as possible: allergic reactions like skin rash, itching or hives, swelling of the face, lips, or tongue signs of infection - fever or chills, cough, sore throat, pain or difficulty passing urine signs of decreased platelets or bleeding - bruising, pinpoint red spots on the skin, black, tarry stools, nosebleeds signs of decreased red blood cells - unusually weak or tired, fainting spells, lightheadedness breathing  problems changes in hearing changes in vision chest pain high blood pressure low blood counts - This drug may decrease the number of white blood cells, red blood cells and platelets. You may be at increased risk for infections and bleeding. nausea and vomiting pain, swelling, redness or irritation at the injection site pain, tingling, numbness in the hands or feet problems with balance, talking, walking trouble passing urine or change in the amount of urine Side effects that usually do not require medical attention (report to your doctor or health care professional if they continue or are bothersome): hair loss loss of appetite metallic taste in the mouth or changes in taste This list may not describe all possible side effects. Call your doctor for medical advice about side effects. You may report side effects to FDA at 1-800-FDA-1088. Where should I keep my medication? This drug is given in a hospital or clinic and will not be stored at home. NOTE: This sheet is a summary. It may not cover all possible information. If  you have questions about this medicine, talk to your doctor, pharmacist, or health care provider.  2022 Elsevier/Gold Standard (2008-02-10 00:00:00)

## 2021-07-27 NOTE — Telephone Encounter (Signed)
Patient seen by Dr. Sherrill today ? ?Vitals are within treatment parameters. ? ?Labs reviewed by Dr. Sherrill and are within treatment parameters. ? ?Per physician team, patient is ready for treatment and there are NO modifications to the treatment plan.  ?

## 2021-07-27 NOTE — Progress Notes (Signed)
  [facility] OFFICE PROGRESS NOTE   Diagnosis: Nasopharynx carcinoma  INTERVAL HISTORY:   Mr. Koziol completed day 8 gemcitabine on 07/05/2021.  No rash or fever.  He feels well.  The headaches have almost completely resolved.  He is no longer taking pain medication.  He reports feeling his breathing in both ears.  This resolves when he closes his nose and blows.  No change in his hearing.  Objective:  Vital signs in last 24 hours:  Blood pressure 104/72, pulse 100, temperature 98.7 F (37.1 C), temperature source Oral, resp. rate 20, height 6\' 1"  (1.854 m), weight 264 lb (119.7 kg), SpO2 98 %.    HEENT: No thrush or ulcers.  Minimal cerumen in both external canals. Lymphatics: I can barely palpate the high left anterior cervical node Resp: Lungs clear bilaterally Cardio: Regular rate and rhythm GI: No hepatosplenomegaly Vascular: No leg edema  Portacath/PICC-without erythema  Lab Results:  Lab Results  Component Value Date   WBC 5.7 07/27/2021   HGB 12.4 (L) 07/27/2021   HCT 37.6 (L) 07/27/2021   MCV 98.9 07/27/2021   PLT 392 07/27/2021   NEUTROABS 3.4 07/27/2021    CMP  Lab Results  Component Value Date   NA 139 07/27/2021   K 4.3 07/27/2021   CL 103 07/27/2021   CO2 27 07/27/2021   GLUCOSE 184 (H) 07/27/2021   BUN 16 07/27/2021   CREATININE 0.86 07/27/2021   CALCIUM 9.4 07/27/2021   PROT 6.9 07/27/2021   ALBUMIN 4.2 07/27/2021   AST 17 07/27/2021   ALT 23 07/27/2021   ALKPHOS 50 07/27/2021   BILITOT 0.5 07/27/2021   GFRNONAA >60 07/27/2021   GFRAA 108 09/29/2020     Medications: I have reviewed the patient's current medications.   Assessment/Plan: Nasopharyngeal cancer, presenting with left neck adenopathy MRI cervical spine 04/27/2021-bilateral enlarged upper jugular chain lymph nodes.  Right level 2 node measures 18 mm CT neck 05/25/2021-bilateral nasopharyngeal mass eroding the skull base at the level of the clivus and inferior  sphenoid sinus, enlarged right jugular digastric node into enlarged left level 2 nodes, mild soft tissue density in the inferior left sphenoid sinus-tumor? 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 Symes has completed 1 cycle of gemcitabine/carboplatin.  Headaches have improved and the left cervical lymph node is much smaller.  He will complete cycle 2 beginning today.  The plan is to refer him for a restaging PET scan after cycle 3.  Mr. Seeman reports an abnormal sensation in his ears that improves with a change in nasal pressure.  This may be related to eustachian tube dysfunction.  No change in his hearing is noted.  I doubt this is related to chemotherapy toxicity.  It is possible the sensation is related to the nasopharyngeal mass.  He will return in 1 week for day 8 gemcitabine.  Mr. Seidman will return for an office visit prior to cycle 3 chemotherapy.  [name], MD  07/27/2021  2:09 PM

## 2021-07-27 NOTE — Progress Notes (Signed)
Patient presents for treatment. RN assessment completed along with the following:  Labs/vitals reviewed - Yes, and within treatment parameters.   Weight within 10% of previous measurement - Yes Oncology Treatment Attestation completed for current therapy- Yes, on date 07/13/21 Informed consent completed and reflects current therapy/intent - Yes, on date 07/13/21             Provider progress note reviewed - Today's provider note is not yet available. I reviewed the most recent oncology provider progress note in chart dated 07/13/21. Treatment/Antibody/Supportive plan reviewed - Yes, and there are no adjustments needed for today's treatment. S&H and other orders reviewed - Yes, and there are no additional orders identified. Previous treatment date reviewed - Yes, and the appropriate amount of time has elapsed between treatments.   Patient to proceed with treatment.

## 2021-07-27 NOTE — Patient Instructions (Signed)

## 2021-07-28 ENCOUNTER — Other Ambulatory Visit: Payer: Self-pay | Admitting: Oncology

## 2021-07-30 ENCOUNTER — Other Ambulatory Visit: Payer: Self-pay

## 2021-07-30 ENCOUNTER — Ambulatory Visit: Payer: Self-pay

## 2021-08-01 ENCOUNTER — Other Ambulatory Visit: Payer: Self-pay | Admitting: Oncology

## 2021-08-03 ENCOUNTER — Inpatient Hospital Stay: Payer: BC Managed Care – PPO

## 2021-08-03 ENCOUNTER — Other Ambulatory Visit: Payer: Self-pay

## 2021-08-03 ENCOUNTER — Other Ambulatory Visit: Payer: Self-pay | Admitting: *Deleted

## 2021-08-03 VITALS — BP 116/80 | HR 98 | Temp 97.5°F | Resp 20 | Wt 262.2 lb

## 2021-08-03 DIAGNOSIS — C119 Malignant neoplasm of nasopharynx, unspecified: Secondary | ICD-10-CM | POA: Diagnosis not present

## 2021-08-03 DIAGNOSIS — Z79899 Other long term (current) drug therapy: Secondary | ICD-10-CM | POA: Diagnosis not present

## 2021-08-03 DIAGNOSIS — Z5111 Encounter for antineoplastic chemotherapy: Secondary | ICD-10-CM | POA: Diagnosis not present

## 2021-08-03 LAB — CBC WITH DIFFERENTIAL (CANCER CENTER ONLY)
Abs Immature Granulocytes: 0.03 10*3/uL (ref 0.00–0.07)
Basophils Absolute: 0.1 10*3/uL (ref 0.0–0.1)
Basophils Relative: 2 %
Eosinophils Absolute: 0 10*3/uL (ref 0.0–0.5)
Eosinophils Relative: 1 %
HCT: 37.1 % — ABNORMAL LOW (ref 39.0–52.0)
Hemoglobin: 12.5 g/dL — ABNORMAL LOW (ref 13.0–17.0)
Immature Granulocytes: 1 %
Lymphocytes Relative: 45 %
Lymphs Abs: 1.8 10*3/uL (ref 0.7–4.0)
MCH: 32.9 pg (ref 26.0–34.0)
MCHC: 33.7 g/dL (ref 30.0–36.0)
MCV: 97.6 fL (ref 80.0–100.0)
Monocytes Absolute: 0.4 10*3/uL (ref 0.1–1.0)
Monocytes Relative: 10 %
Neutro Abs: 1.6 10*3/uL — ABNORMAL LOW (ref 1.7–7.7)
Neutrophils Relative %: 41 %
Platelet Count: 310 10*3/uL (ref 150–400)
RBC: 3.8 MIL/uL — ABNORMAL LOW (ref 4.22–5.81)
RDW: 12.5 % (ref 11.5–15.5)
WBC Count: 3.9 10*3/uL — ABNORMAL LOW (ref 4.0–10.5)
nRBC: 0 % (ref 0.0–0.2)

## 2021-08-03 LAB — CMP (CANCER CENTER ONLY)
ALT: 33 U/L (ref 0–44)
AST: 18 U/L (ref 15–41)
Albumin: 4.5 g/dL (ref 3.5–5.0)
Alkaline Phosphatase: 50 U/L (ref 38–126)
Anion gap: 7 (ref 5–15)
BUN: 28 mg/dL — ABNORMAL HIGH (ref 8–23)
CO2: 29 mmol/L (ref 22–32)
Calcium: 9.7 mg/dL (ref 8.9–10.3)
Chloride: 100 mmol/L (ref 98–111)
Creatinine: 0.97 mg/dL (ref 0.61–1.24)
GFR, Estimated: >60 mL/min (ref >60)
Glucose, Bld: 179 mg/dL — ABNORMAL HIGH (ref 70–99)
Potassium: 4.4 mmol/L (ref 3.5–5.1)
Sodium: 136 mmol/L (ref 135–145)
Total Bilirubin: 0.8 mg/dL (ref 0.3–1.2)
Total Protein: 7.5 g/dL (ref 6.5–8.1)

## 2021-08-03 MED ORDER — HEPARIN SOD (PORK) LOCK FLUSH 100 UNIT/ML IV SOLN
500.0000 [IU] | Freq: Once | INTRAVENOUS | Status: AC | PRN
  Administered 2021-08-03: 500 [IU]

## 2021-08-03 MED ORDER — SODIUM CHLORIDE 0.9% FLUSH
10.0000 mL | INTRAVENOUS | Status: DC | PRN
  Administered 2021-08-03: 10 mL

## 2021-08-03 MED ORDER — SODIUM CHLORIDE 0.9 % IV SOLN
1000.0000 mg/m2 | Freq: Once | INTRAVENOUS | Status: AC
  Administered 2021-08-03: 2470 mg via INTRAVENOUS
  Filled 2021-08-03: qty 15.78

## 2021-08-03 MED ORDER — MAGIC MOUTHWASH: 5.0000 mL | Freq: Four times a day (QID) | ORAL | 0 refills | Status: DC | PRN

## 2021-08-03 MED ORDER — SODIUM CHLORIDE 0.9 % IV SOLN: Freq: Once | INTRAVENOUS | Status: AC

## 2021-08-03 MED ORDER — PROCHLORPERAZINE MALEATE 10 MG PO TABS
10.0000 mg | ORAL_TABLET | Freq: Once | ORAL | Status: AC
  Administered 2021-08-03: 10 mg via ORAL
  Filled 2021-08-03: qty 1

## 2021-08-03 NOTE — Progress Notes (Signed)
Patient presents for treatment. RN assessment completed along with the following:  Labs/vitals reviewed - Yes, and within treatment parameters.   Weight within 10% of previous measurement - Yes Oncology Treatment Attestation completed for current therapy- Yes, on date 07/13/21 Informed consent completed and reflects current therapy/intent - Yes, on date 07/13/21             Provider progress note reviewed - Patient not seen by provider today. Most recent note dated 07/27/21 reviewed. Treatment/Antibody/Supportive plan reviewed - Yes, and there are no adjustments needed for today's treatment. S&H and other orders reviewed - Yes, and there are no additional orders identified. Previous treatment date reviewed - Yes, and the appropriate amount of time has elapsed between treatments. Clinic Hand Off Received from - none   Patient to proceed with treatment.

## 2021-08-03 NOTE — Progress Notes (Signed)
Pt came in today for infusion informed infusion nurse (Toni)that he had a mouth sore. Pt requested something for mouth pain. Magic mouthwash called into the pharmacy.

## 2021-08-03 NOTE — Patient Instructions (Signed)
[facility]   Discharge Instructions: Thank you for choosing [facility] to provide your oncology and hematology care.   If you have a lab appointment with the [facility], please go directly to the [facility] and check in at the registration area.   Wear comfortable clothing and clothing appropriate for easy access to any Portacath or PICC line.   We strive to give you quality time with your provider. You may need to reschedule your appointment if you arrive late (15 or more minutes).  Arriving late affects you and other patients whose appointments are after yours.  Also, if you miss three or more appointments without notifying the office, you may be dismissed from the clinic at the provider's discretion.      For prescription refill requests, have your pharmacy contact our office and allow 72 hours for refills to be completed.    Today you received the following chemotherapy and/or immunotherapy agents Gemzar      To help prevent nausea and vomiting after your treatment, we encourage you to take your nausea medication as directed.  BELOW ARE SYMPTOMS THAT SHOULD BE REPORTED IMMEDIATELY: *FEVER GREATER THAN 100.4 F (38 C) OR HIGHER *CHILLS OR SWEATING *NAUSEA AND VOMITING THAT IS NOT CONTROLLED WITH YOUR NAUSEA MEDICATION *UNUSUAL SHORTNESS OF BREATH *UNUSUAL BRUISING OR BLEEDING *URINARY PROBLEMS (pain or burning when urinating, or frequent urination) *BOWEL PROBLEMS (unusual diarrhea, constipation, pain near the anus) TENDERNESS IN MOUTH AND THROAT WITH OR WITHOUT PRESENCE OF ULCERS (sore throat, sores in mouth, or a toothache) UNUSUAL RASH, SWELLING OR PAIN  UNUSUAL VAGINAL DISCHARGE OR ITCHING   Items with * indicate a potential emergency and should be followed up as soon as possible or go to the Emergency Department if any problems should occur.  Please show the CHEMOTHERAPY ALERT CARD or IMMUNOTHERAPY ALERT CARD at check-in to the  Emergency Department and triage nurse.  Should you have questions after your visit or need to cancel or reschedule your appointment, please contact [facility]  Dept: [phone]  and follow the prompts.  Office hours are 8:00 a.m. to 4:30 p.m. Monday - Friday. Please note that voicemails left after 4:00 p.m. may not be returned until the following business day.  We are closed weekends and major holidays. You have access to a nurse at all times for urgent questions. Please call the main number to the clinic Dept: [phone] and follow the prompts.   For any non-urgent questions, you may also contact your provider using MyChart. We now offer e-Visits for anyone 72 and older to request care online for non-urgent symptoms. For details visit [email or web address].   Also download the MyChart app! Go to the app store, search "MyChart", open the app, select [facility], and log in with your MyChart username and password.  Due to Covid, a mask is required upon entering the hospital/clinic. If you do not have a mask, one will be given to you upon arrival. For doctor visits, patients may have 1 support person aged 84 or older with them. For treatment visits, patients cannot have anyone with them due to current Covid guidelines and our immunocompromised population.   Gemcitabine injection What is this medication? GEMCITABINE (jem SYE ta been) is a chemotherapy drug. This medicine is used to treat many types of cancer like breast cancer, lung cancer, pancreatic cancer, and ovarian cancer. This medicine may be used for other purposes; ask your health care provider  or pharmacist if you have questions. COMMON BRAND NAME(S): Gemzar, Infugem What should I tell my care team before I take this medication? They need to know if you have any of these conditions: blood disorders infection kidney disease liver disease lung or breathing disease, like asthma recent or ongoing radiation  therapy an unusual or allergic reaction to gemcitabine, other chemotherapy, other medicines, foods, dyes, or preservatives pregnant or trying to get pregnant breast-feeding How should I use this medication? This drug is given as an infusion into a vein. It is administered in a hospital or clinic by a specially trained health care professional. Talk to your pediatrician regarding the use of this medicine in children. Special care may be needed. Overdosage: If you think you have taken too much of this medicine contact a poison control center or emergency room at once. NOTE: This medicine is only for you. Do not share this medicine with others. What if I miss a dose? It is important not to miss your dose. Call your doctor or health care professional if you are unable to keep an appointment. What may interact with this medication? medicines to increase blood counts like filgrastim, pegfilgrastim, sargramostim some other chemotherapy drugs like cisplatin vaccines Talk to your doctor or health care professional before taking any of these medicines: acetaminophen aspirin ibuprofen ketoprofen naproxen This list may not describe all possible interactions. Give your health care provider a list of all the medicines, herbs, non-prescription drugs, or dietary supplements you use. Also tell them if you smoke, drink alcohol, or use illegal drugs. Some items may interact with your medicine. What should I watch for while using this medication? Visit your doctor for checks on your progress. This drug may make you feel generally unwell. This is not uncommon, as chemotherapy can affect healthy cells as well as cancer cells. Report any side effects. Continue your course of treatment even though you feel ill unless your doctor tells you to stop. In some cases, you may be given additional medicines to help with side effects. Follow all directions for their use. Call your doctor or health care professional for  advice if you get a fever, chills or sore throat, or other symptoms of a cold or flu. Do not treat yourself. This drug decreases your body's ability to fight infections. Try to avoid being around people who are sick. This medicine may increase your risk to bruise or bleed. Call your doctor or health care professional if you notice any unusual bleeding. Be careful brushing and flossing your teeth or using a toothpick because you may get an infection or bleed more easily. If you have any dental work done, tell your dentist you are receiving this medicine. Avoid taking products that contain aspirin, acetaminophen, ibuprofen, naproxen, or ketoprofen unless instructed by your doctor. These medicines may hide a fever. Do not become pregnant while taking this medicine or for 6 months after stopping it. Women should inform their doctor if they wish to become pregnant or think they might be pregnant. Men should not father a child while taking this medicine and for 3 months after stopping it. There is a potential for serious side effects to an unborn child. Talk to your health care professional or pharmacist for more information. Do not breast-feed an infant while taking this medicine or for at least 1 week after stopping it. Men should inform their doctors if they wish to father a child. This medicine may lower sperm counts. Talk with your doctor or health  care professional if you are concerned about your fertility. What side effects may I notice from receiving this medication? Side effects that you should report to your doctor or health care professional as soon as possible: allergic reactions like skin rash, itching or hives, swelling of the face, lips, or tongue breathing problems pain, redness, or irritation at site where injected signs and symptoms of a dangerous change in heartbeat or heart rhythm like chest pain; dizziness; fast or irregular heartbeat; palpitations; feeling faint or lightheaded, falls;  breathing problems signs of decreased platelets or bleeding - bruising, pinpoint red spots on the skin, black, tarry stools, blood in the urine signs of decreased red blood cells - unusually weak or tired, feeling faint or lightheaded, falls signs of infection - fever or chills, cough, sore throat, pain or difficulty passing urine signs and symptoms of kidney injury like trouble passing urine or change in the amount of urine signs and symptoms of liver injury like dark yellow or brown urine; general ill feeling or flu-like symptoms; light-colored stools; loss of appetite; nausea; right upper belly pain; unusually weak or tired; yellowing of the eyes or skin swelling of ankles, feet, hands Side effects that usually do not require medical attention (report to your doctor or health care professional if they continue or are bothersome): constipation diarrhea hair loss loss of appetite nausea rash vomiting This list may not describe all possible side effects. Call your doctor for medical advice about side effects. You may report side effects to FDA at 1-800-FDA-1088. Where should I keep my medication? This drug is given in a hospital or clinic and will not be stored at home. NOTE: This sheet is a summary. It may not cover all possible information. If you have questions about this medicine, talk to your doctor, pharmacist, or health care provider.  2022 [employer]/Gold Standard (2017-11-26 00:00:00)

## 2021-08-03 NOTE — Progress Notes (Signed)
PET for restaging after 3 cycles of Gemzar/Carboplatin ordered for week of Dec 19-23.

## 2021-08-12 ENCOUNTER — Other Ambulatory Visit: Payer: Self-pay | Admitting: Oncology

## 2021-08-15 ENCOUNTER — Inpatient Hospital Stay: Payer: BC Managed Care – PPO | Admitting: Nutrition

## 2021-08-15 ENCOUNTER — Other Ambulatory Visit: Payer: Self-pay

## 2021-08-15 NOTE — Progress Notes (Signed)
Nutrition follow up completed on the telephone.   Patient receiving gemcitabine/carboplatin for Nasopharyngeal cancer. Weight stable overall at 262.2 pounds Nov 18 from 265 pounds on October 12. Labs include: Glucose 179 and BUN 28.  Patient reports he is doing well. Denies major side effects. He has begun to lose his hair. States he has occasional nausea which resolves with antiemetics. Reports he has one mouth sore which is now gone. He is was prescribed MMW. Reports he is starting radiation in January and was told he would need a feeding tube.  Nutrition Diagnosis: Predicted sub-optimal energy intake continues.  Intervention: Educated to continue small frequent meals and snacks.  Continue medications as needed. Consider baking soda and salt water rinses for mouth care. Basic education given on feeding tube placement and nutrition. Questions answered and teach back method used.  Monitoring, Evaluation, Goals: Patient will tolerate increased calories and protein for wt maintenance.  Next Visit: Wednesday, by telephone, on December 14.

## 2021-08-17 ENCOUNTER — Other Ambulatory Visit: Payer: Self-pay

## 2021-08-17 ENCOUNTER — Telehealth: Payer: Self-pay

## 2021-08-17 ENCOUNTER — Inpatient Hospital Stay (HOSPITAL_BASED_OUTPATIENT_CLINIC_OR_DEPARTMENT_OTHER): Payer: BC Managed Care – PPO | Admitting: Oncology

## 2021-08-17 ENCOUNTER — Inpatient Hospital Stay: Payer: BC Managed Care – PPO | Attending: Oncology

## 2021-08-17 ENCOUNTER — Inpatient Hospital Stay: Payer: BC Managed Care – PPO

## 2021-08-17 VITALS — BP 105/65 | HR 85

## 2021-08-17 VITALS — BP 107/62 | HR 100 | Temp 98.1°F | Resp 20 | Ht 73.0 in | Wt 267.0 lb

## 2021-08-17 DIAGNOSIS — J449 Chronic obstructive pulmonary disease, unspecified: Secondary | ICD-10-CM | POA: Diagnosis not present

## 2021-08-17 DIAGNOSIS — E039 Hypothyroidism, unspecified: Secondary | ICD-10-CM | POA: Insufficient documentation

## 2021-08-17 DIAGNOSIS — Z79899 Other long term (current) drug therapy: Secondary | ICD-10-CM | POA: Diagnosis not present

## 2021-08-17 DIAGNOSIS — C119 Malignant neoplasm of nasopharynx, unspecified: Secondary | ICD-10-CM

## 2021-08-17 DIAGNOSIS — F17210 Nicotine dependence, cigarettes, uncomplicated: Secondary | ICD-10-CM | POA: Insufficient documentation

## 2021-08-17 DIAGNOSIS — M069 Rheumatoid arthritis, unspecified: Secondary | ICD-10-CM | POA: Insufficient documentation

## 2021-08-17 DIAGNOSIS — Z5111 Encounter for antineoplastic chemotherapy: Secondary | ICD-10-CM | POA: Insufficient documentation

## 2021-08-17 DIAGNOSIS — E119 Type 2 diabetes mellitus without complications: Secondary | ICD-10-CM | POA: Diagnosis not present

## 2021-08-17 LAB — CMP (CANCER CENTER ONLY)
ALT: 41 U/L (ref 0–44)
AST: 28 U/L (ref 15–41)
Albumin: 4.5 g/dL (ref 3.5–5.0)
Alkaline Phosphatase: 45 U/L (ref 38–126)
Anion gap: 10 (ref 5–15)
BUN: 21 mg/dL (ref 8–23)
CO2: 26 mmol/L (ref 22–32)
Calcium: 9.8 mg/dL (ref 8.9–10.3)
Chloride: 100 mmol/L (ref 98–111)
Creatinine: 0.84 mg/dL (ref 0.61–1.24)
GFR, Estimated: >60 mL/min (ref >60)
Glucose, Bld: 243 mg/dL — ABNORMAL HIGH (ref 70–99)
Potassium: 4.1 mmol/L (ref 3.5–5.1)
Sodium: 136 mmol/L (ref 135–145)
Total Bilirubin: 0.9 mg/dL (ref 0.3–1.2)
Total Protein: 6.9 g/dL (ref 6.5–8.1)

## 2021-08-17 LAB — CBC WITH DIFFERENTIAL (CANCER CENTER ONLY)
Abs Immature Granulocytes: 0.03 10*3/uL (ref 0.00–0.07)
Basophils Absolute: 0 10*3/uL (ref 0.0–0.1)
Basophils Relative: 1 %
Eosinophils Absolute: 0.1 10*3/uL (ref 0.0–0.5)
Eosinophils Relative: 2 %
HCT: 36.6 % — ABNORMAL LOW (ref 39.0–52.0)
Hemoglobin: 12 g/dL — ABNORMAL LOW (ref 13.0–17.0)
Immature Granulocytes: 1 %
Lymphocytes Relative: 27 %
Lymphs Abs: 1.7 10*3/uL (ref 0.7–4.0)
MCH: 32.6 pg (ref 26.0–34.0)
MCHC: 32.8 g/dL (ref 30.0–36.0)
MCV: 99.5 fL (ref 80.0–100.0)
Monocytes Absolute: 0.8 10*3/uL (ref 0.1–1.0)
Monocytes Relative: 13 %
Neutro Abs: 3.6 10*3/uL (ref 1.7–7.7)
Neutrophils Relative %: 56 %
Platelet Count: 265 10*3/uL (ref 150–400)
RBC: 3.68 MIL/uL — ABNORMAL LOW (ref 4.22–5.81)
RDW: 14.2 % (ref 11.5–15.5)
WBC Count: 6.2 10*3/uL (ref 4.0–10.5)
nRBC: 0 % (ref 0.0–0.2)

## 2021-08-17 MED ORDER — SODIUM CHLORIDE 0.9 % IV SOLN
1000.0000 mg/m2 | Freq: Once | INTRAVENOUS | Status: AC
  Administered 2021-08-17: 2470 mg via INTRAVENOUS
  Filled 2021-08-17: qty 52.6

## 2021-08-17 MED ORDER — SODIUM CHLORIDE 0.9 % IV SOLN
150.0000 mg | Freq: Once | INTRAVENOUS | Status: AC
  Administered 2021-08-17: 150 mg via INTRAVENOUS
  Filled 2021-08-17: qty 5

## 2021-08-17 MED ORDER — SODIUM CHLORIDE 0.9 % IV SOLN
10.0000 mg | Freq: Once | INTRAVENOUS | Status: AC
  Administered 2021-08-17: 10 mg via INTRAVENOUS
  Filled 2021-08-17: qty 1

## 2021-08-17 MED ORDER — PALONOSETRON HCL INJECTION 0.25 MG/5ML
0.2500 mg | Freq: Once | INTRAVENOUS | Status: AC
  Administered 2021-08-17: 0.25 mg via INTRAVENOUS
  Filled 2021-08-17: qty 5

## 2021-08-17 MED ORDER — SODIUM CHLORIDE 0.9 % IV SOLN: Freq: Once | INTRAVENOUS | Status: AC

## 2021-08-17 MED ORDER — SODIUM CHLORIDE 0.9 % IV SOLN
500.0000 mg | Freq: Once | INTRAVENOUS | Status: AC
  Administered 2021-08-17: 500 mg via INTRAVENOUS
  Filled 2021-08-17: qty 50

## 2021-08-17 MED ORDER — SODIUM CHLORIDE 0.9% FLUSH
10.0000 mL | INTRAVENOUS | Status: DC | PRN
  Administered 2021-08-17: 10 mL

## 2021-08-17 MED ORDER — HEPARIN SOD (PORK) LOCK FLUSH 100 UNIT/ML IV SOLN
500.0000 [IU] | Freq: Once | INTRAVENOUS | Status: AC | PRN
  Administered 2021-08-17: 500 [IU]

## 2021-08-17 NOTE — Telephone Encounter (Signed)
Patient seen by Dr. Sherrill today ? ?Vitals are within treatment parameters. ? ?Labs reviewed by Dr. Sherrill and are within treatment parameters. ? ?Per physician team, patient is ready for treatment and there are NO modifications to the treatment plan.  ?

## 2021-08-17 NOTE — Progress Notes (Signed)
Patient presents for treatment. RN assessment completed along with the following:  Labs/vitals reviewed - Yes, and within treatment parameters.   Weight within 10% of previous measurement - Yes Oncology Treatment Attestation completed for current therapy- Yes, on date 07/13/21 Informed consent completed and reflects current therapy/intent - Yes, on date 07/06/21             Provider progress note reviewed - Today's provider note is not yet available. I reviewed the most recent oncology provider progress note in chart dated 07/27/21. Treatment/Antibody/Supportive plan reviewed - Yes, and there are no adjustments needed for today's treatment. S&H and other orders reviewed - Yes, and there are no additional orders identified. Previous treatment date reviewed - Yes, and the appropriate amount of time has elapsed between treatments. Clinic Hand Off Received from - Danae Orleans, LPN  Patient to proceed with treatment.

## 2021-08-17 NOTE — Progress Notes (Signed)
  [facility] OFFICE PROGRESS NOTE   Diagnosis: Nasopharynx cancer  INTERVAL HISTORY:   Mr Krupka completed another cycle of gemcitabine/carboplatin beginning 07/27/2021.  No nausea/vomiting.  He had 1 ulcer at the upper inner lip.  This has resolved.  No change in his hearing.  No headaches.  He can no longer palpate the left neck lymph node.  Objective:  Vital signs in last 24 hours:  Blood pressure 107/62, pulse 100, temperature 98.1 F (36.7 C), temperature source Oral, resp. rate 20, height 6\' 1"  (1.854 m), weight 267 lb (121.1 kg), SpO2 98 %.    HEENT: No thrush or ulcers Lymphatics: Slight fullness in the high left anterior cervical region without a discrete lymph node Resp: Distant breath sounds, no respiratory distress Cardio: Regular rate and rhythm GI: No hepatosplenomegaly Vascular: No leg edema   Portacath/PICC-without erythema  Lab Results:  Lab Results  Component Value Date   WBC 6.2 08/17/2021   HGB 12.0 (L) 08/17/2021   HCT 36.6 (L) 08/17/2021   MCV 99.5 08/17/2021   PLT 265 08/17/2021   NEUTROABS 3.6 08/17/2021    CMP  Lab Results  Component Value Date   NA 136 08/03/2021   K 4.4 08/03/2021   CL 100 08/03/2021   CO2 29 08/03/2021   GLUCOSE 179 (H) 08/03/2021   BUN 28 (H) 08/03/2021   CREATININE 0.97 08/03/2021   CALCIUM 9.7 08/03/2021   PROT 7.5 08/03/2021   ALBUMIN 4.5 08/03/2021   AST 18 08/03/2021   ALT 33 08/03/2021   ALKPHOS 50 08/03/2021   BILITOT 0.8 08/03/2021   GFRNONAA >60 08/03/2021   GFRAA 108 09/29/2020     Medications: I have reviewed the patient's current medications.   Assessment/Plan: Nasopharyngeal cancer, presenting with left neck adenopathy MRI cervical spine 04/27/2021-bilateral enlarged upper jugular chain lymph nodes.  Right level 2 node measures 18 mm CT neck 05/25/2021-bilateral nasopharyngeal mass eroding the skull base at the level of the clivus and inferior sphenoid sinus, enlarged right jugular  digastric node into enlarged left level 2 nodes, mild soft tissue density in the inferior left sphenoid sinus-tumor? 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 MRI face 06/19/2021-extensive tumor invasion of the skull base, no trigeminal or other cranial nerve involvement, abnormal bilateral level 2 nodes PET 06/19/2021-decrease size of nasopharyngeal mass compared to 05/25/2021 CT, invasion of the clivus, single bilateral level 2A lymph nodes mildly enlarged and hypermetabolic, no evidence of distant metastatic disease Cycle 1 gemcitabine/carboplatin 07/06/2021, day 8 gemcitabine 07/05/2021 Cycle 2 gemcitabine/carboplatin 07/27/2021 Cycle 3 gemcitabine/carboplatin 08/17/2021 Rheumatoid arthritis Diabetes COPD Tobacco use Hypothyroid Remote history ITP, 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  He is tolerating the chemotherapy well.  The left neck lymph node is no longer palpable and his headaches have resolved.  He will complete cycle 3 gemcitabine/carboplatin beginning today.  He is scheduled for a restaging PET scan 09/04/2021.  He will return for an office visit on 09/05/2021.  I will coordinate concurrent chemotherapy and radiation with Dr. Isidore Moos.  [name], MD  08/17/2021  10:32 AM

## 2021-08-17 NOTE — Patient Instructions (Signed)
Dustin [name]   Discharge Instructions: Thank you for choosing [facility] to provide your oncology and hematology care.   If you have a lab appointment with the [facility], please go directly to the [facility] and check in at the registration area.   Wear comfortable clothing and clothing appropriate for easy access to any Portacath or PICC line.   We strive to give you quality time with your provider. You may need to reschedule your appointment if you arrive late (15 or more minutes).  Arriving late affects you and other patients whose appointments are after yours.  Also, if you miss three or more appointments without notifying the office, you may be dismissed from the clinic at the provider's discretion.      For prescription refill requests, have your pharmacy contact our office and allow 72 hours for refills to be completed.    Today you received the following chemotherapy and/or immunotherapy agents Gemzar, Carboplatin      To help prevent nausea and vomiting after your treatment, we encourage you to take your nausea medication as directed.  BELOW ARE SYMPTOMS THAT SHOULD BE REPORTED IMMEDIATELY: *FEVER GREATER THAN 100.4 F (38 C) OR HIGHER *CHILLS OR SWEATING *NAUSEA AND VOMITING THAT IS NOT CONTROLLED WITH YOUR NAUSEA MEDICATION *UNUSUAL SHORTNESS OF BREATH *UNUSUAL BRUISING OR BLEEDING *URINARY PROBLEMS (pain or burning when urinating, or frequent urination) *BOWEL PROBLEMS (unusual diarrhea, constipation, pain near the anus) TENDERNESS IN MOUTH AND THROAT WITH OR WITHOUT PRESENCE OF ULCERS (sore throat, sores in mouth, or a toothache) UNUSUAL RASH, SWELLING OR PAIN  UNUSUAL VAGINAL DISCHARGE OR ITCHING   Items with * indicate a potential emergency and should be followed up as soon as possible or go to the Emergency Department if any problems should occur.  Please show the CHEMOTHERAPY ALERT CARD or IMMUNOTHERAPY ALERT CARD at  check-in to the Emergency Department and triage nurse.  Should you have questions after your visit or need to cancel or reschedule your appointment, please contact [facility]  Dept: [phone]  and follow the prompts.  Office hours are 8:00 a.m. to 4:30 p.m. Monday - Friday. Please note that voicemails left after 4:00 p.m. may not be returned until the following business day.  We are closed weekends and major holidays. You have access to a nurse at all times for urgent questions. Please call the main number to the clinic Dept: [phone] and follow the prompts.   For any non-urgent questions, you may also contact your provider using MyChart. We now offer e-Visits for anyone 77 and older to request care online for non-urgent symptoms. For details visit [email or web address].   Also download the MyChart app! Go to the app store, search "MyChart", open the app, select [facility], and log in with your MyChart username and password.  Due to Covid, a mask is required upon entering the hospital/clinic. If you do not have a mask, one will be given to you upon arrival. For doctor visits, patients may have 1 support person aged 71 or older with them. For treatment visits, patients cannot have anyone with them due to current Covid guidelines and our immunocompromised population.   Gemcitabine injection What is this medication? GEMCITABINE (jem SYE ta been) is a chemotherapy drug. This medicine is used to treat many types of cancer like breast cancer, lung cancer, pancreatic cancer, and ovarian cancer. This medicine may be used for other purposes; ask your health care  provider or pharmacist if you have questions. COMMON BRAND NAME(S): Gemzar, Infugem What should I tell my care team before I take this medication? They need to know if you have any of these conditions: blood disorders infection kidney disease liver disease lung or breathing disease, like asthma recent or  ongoing radiation therapy an unusual or allergic reaction to gemcitabine, other chemotherapy, other medicines, foods, dyes, or preservatives pregnant or trying to get pregnant breast-feeding How should I use this medication? This drug is given as an infusion into a vein. It is administered in a hospital or clinic by a specially trained health care professional. Talk to your pediatrician regarding the use of this medicine in children. Special care may be needed. Overdosage: If you think you have taken too much of this medicine contact a poison control center or emergency room at once. NOTE: This medicine is only for you. Do not share this medicine with others. What if I miss a dose? It is important not to miss your dose. Call your doctor or health care professional if you are unable to keep an appointment. What may interact with this medication? medicines to increase blood counts like filgrastim, pegfilgrastim, sargramostim some other chemotherapy drugs like cisplatin vaccines Talk to your doctor or health care professional before taking any of these medicines: acetaminophen aspirin ibuprofen ketoprofen naproxen This list may not describe all possible interactions. Give your health care provider a list of all the medicines, herbs, non-prescription drugs, or dietary supplements you use. Also tell them if you smoke, drink alcohol, or use illegal drugs. Some items may interact with your medicine. What should I watch for while using this medication? Visit your doctor for checks on your progress. This drug may make you feel generally unwell. This is not uncommon, as chemotherapy can affect healthy cells as well as cancer cells. Report any side effects. Continue your course of treatment even though you feel ill unless your doctor tells you to stop. In some cases, you may be given additional medicines to help with side effects. Follow all directions for their use. Call your doctor or health care  professional for advice if you get a fever, chills or sore throat, or other symptoms of a cold or flu. Do not treat yourself. This drug decreases your body's ability to fight infections. Try to avoid being around people who are sick. This medicine may increase your risk to bruise or bleed. Call your doctor or health care professional if you notice any unusual bleeding. Be careful brushing and flossing your teeth or using a toothpick because you may get an infection or bleed more easily. If you have any dental work done, tell your dentist you are receiving this medicine. Avoid taking products that contain aspirin, acetaminophen, ibuprofen, naproxen, or ketoprofen unless instructed by your doctor. These medicines may hide a fever. Do not become pregnant while taking this medicine or for 6 months after stopping it. Women should inform their doctor if they wish to become pregnant or think they might be pregnant. Men should not father a child while taking this medicine and for 3 months after stopping it. There is a potential for serious side effects to an unborn child. Talk to your health care professional or pharmacist for more information. Do not breast-feed an infant while taking this medicine or for at least 1 week after stopping it. Men should inform their doctors if they wish to father a child. This medicine may lower sperm counts. Talk with your doctor or  health care professional if you are concerned about your fertility. What side effects may I notice from receiving this medication? Side effects that you should report to your doctor or health care professional as soon as possible: allergic reactions like skin rash, itching or hives, swelling of the face, lips, or tongue breathing problems pain, redness, or irritation at site where injected signs and symptoms of a dangerous change in heartbeat or heart rhythm like chest pain; dizziness; fast or irregular heartbeat; palpitations; feeling faint or  lightheaded, falls; breathing problems signs of decreased platelets or bleeding - bruising, pinpoint red spots on the skin, black, tarry stools, blood in the urine signs of decreased red blood cells - unusually weak or tired, feeling faint or lightheaded, falls signs of infection - fever or chills, cough, sore throat, pain or difficulty passing urine signs and symptoms of kidney injury like trouble passing urine or change in the amount of urine signs and symptoms of liver injury like dark yellow or brown urine; general ill feeling or flu-like symptoms; light-colored stools; loss of appetite; nausea; right upper belly pain; unusually weak or tired; yellowing of the eyes or skin swelling of ankles, feet, hands Side effects that usually do not require medical attention (report to your doctor or health care professional if they continue or are bothersome): constipation diarrhea hair loss loss of appetite nausea rash vomiting This list may not describe all possible side effects. Call your doctor for medical advice about side effects. You may report side effects to FDA at 1-800-FDA-1088. Where should I keep my medication? This drug is given in a hospital or clinic and will not be stored at home. NOTE: This sheet is a summary. It may not cover all possible information. If you have questions about this medicine, talk to your doctor, pharmacist, or health care provider.  2022 Elsevier/Gold Standard (2017-11-26 00:00:00)  Carboplatin injection What is this medication? CARBOPLATIN (KAR boe pla tin) is a chemotherapy drug. It targets fast dividing cells, like cancer cells, and causes these cells to die. This medicine is used to treat ovarian cancer and many other cancers. This medicine may be used for other purposes; ask your health care provider or pharmacist if you have questions. COMMON BRAND NAME(S): Paraplatin What should I tell my care team before I take this medication? They need to know if  you have any of these conditions: blood disorders hearing problems kidney disease recent or ongoing radiation therapy an unusual or allergic reaction to carboplatin, cisplatin, other chemotherapy, other medicines, foods, dyes, or preservatives pregnant or trying to get pregnant breast-feeding How should I use this medication? This drug is usually given as an infusion into a vein. It is administered in a hospital or clinic by a specially trained health care professional. Talk to your pediatrician regarding the use of this medicine in children. Special care may be needed. Overdosage: If you think you have taken too much of this medicine contact a poison control center or emergency room at once. NOTE: This medicine is only for you. Do not share this medicine with others. What if I miss a dose? It is important not to miss a dose. Call your doctor or health care professional if you are unable to keep an appointment. What may interact with this medication? medicines for seizures medicines to increase blood counts like filgrastim, pegfilgrastim, sargramostim some antibiotics like amikacin, gentamicin, neomycin, streptomycin, tobramycin vaccines Talk to your doctor or health care professional before taking any of these medicines: acetaminophen aspirin  ibuprofen ketoprofen naproxen This list may not describe all possible interactions. Give your health care provider a list of all the medicines, herbs, non-prescription drugs, or dietary supplements you use. Also tell them if you smoke, drink alcohol, or use illegal drugs. Some items may interact with your medicine. What should I watch for while using this medication? Your condition will be monitored carefully while you are receiving this medicine. You will need important blood work done while you are taking this medicine. This drug may make you feel generally unwell. This is not uncommon, as chemotherapy can affect healthy cells as well as cancer  cells. Report any side effects. Continue your course of treatment even though you feel ill unless your doctor tells you to stop. In some cases, you may be given additional medicines to help with side effects. Follow all directions for their use. Call your doctor or health care professional for advice if you get a fever, chills or sore throat, or other symptoms of a cold or flu. Do not treat yourself. This drug decreases your body's ability to fight infections. Try to avoid being around people who are sick. This medicine may increase your risk to bruise or bleed. Call your doctor or health care professional if you notice any unusual bleeding. Be careful brushing and flossing your teeth or using a toothpick because you may get an infection or bleed more easily. If you have any dental work done, tell your dentist you are receiving this medicine. Avoid taking products that contain aspirin, acetaminophen, ibuprofen, naproxen, or ketoprofen unless instructed by your doctor. These medicines may hide a fever. Do not become pregnant while taking this medicine. Women should inform their doctor if they wish to become pregnant or think they might be pregnant. There is a potential for serious side effects to an unborn child. Talk to your health care professional or pharmacist for more information. Do not breast-feed an infant while taking this medicine. What side effects may I notice from receiving this medication? Side effects that you should report to your doctor or health care professional as soon as possible: allergic reactions like skin rash, itching or hives, swelling of the face, lips, or tongue signs of infection - fever or chills, cough, sore throat, pain or difficulty passing urine signs of decreased platelets or bleeding - bruising, pinpoint red spots on the skin, black, tarry stools, nosebleeds signs of decreased red blood cells - unusually weak or tired, fainting spells, lightheadedness breathing  problems changes in hearing changes in vision chest pain high blood pressure low blood counts - This drug may decrease the number of white blood cells, red blood cells and platelets. You may be at increased risk for infections and bleeding. nausea and vomiting pain, swelling, redness or irritation at the injection site pain, tingling, numbness in the hands or feet problems with balance, talking, walking trouble passing urine or change in the amount of urine Side effects that usually do not require medical attention (report to your doctor or health care professional if they continue or are bothersome): hair loss loss of appetite metallic taste in the mouth or changes in taste This list may not describe all possible side effects. Call your doctor for medical advice about side effects. You may report side effects to FDA at 1-800-FDA-1088. Where should I keep my medication? This drug is given in a hospital or clinic and will not be stored at home. NOTE: This sheet is a summary. It may not cover all possible information.  If you have questions about this medicine, talk to your doctor, pharmacist, or health care provider.  2022 Elsevier/Gold Standard (2008-02-10 00:00:00)

## 2021-08-18 ENCOUNTER — Other Ambulatory Visit: Payer: Self-pay | Admitting: Oncology

## 2021-08-20 ENCOUNTER — Encounter: Payer: Self-pay | Admitting: *Deleted

## 2021-08-20 NOTE — Progress Notes (Signed)
Completed form "Continuing Disability Claim Form--Physician's Statement. Patient will pick up form on 12/09 and copy to HIM to scan.

## 2021-08-22 ENCOUNTER — Ambulatory Visit: Payer: BC Managed Care – PPO | Admitting: Audiologist

## 2021-08-24 ENCOUNTER — Inpatient Hospital Stay: Payer: BC Managed Care – PPO

## 2021-08-24 ENCOUNTER — Telehealth: Payer: Self-pay

## 2021-08-24 ENCOUNTER — Other Ambulatory Visit: Payer: Self-pay

## 2021-08-24 VITALS — BP 93/62 | HR 86 | Temp 98.0°F | Resp 20 | Ht 73.0 in | Wt 265.5 lb

## 2021-08-24 DIAGNOSIS — C119 Malignant neoplasm of nasopharynx, unspecified: Secondary | ICD-10-CM

## 2021-08-24 DIAGNOSIS — E119 Type 2 diabetes mellitus without complications: Secondary | ICD-10-CM | POA: Diagnosis not present

## 2021-08-24 DIAGNOSIS — F17210 Nicotine dependence, cigarettes, uncomplicated: Secondary | ICD-10-CM | POA: Diagnosis not present

## 2021-08-24 DIAGNOSIS — Z79899 Other long term (current) drug therapy: Secondary | ICD-10-CM | POA: Diagnosis not present

## 2021-08-24 DIAGNOSIS — E039 Hypothyroidism, unspecified: Secondary | ICD-10-CM | POA: Diagnosis not present

## 2021-08-24 DIAGNOSIS — Z5111 Encounter for antineoplastic chemotherapy: Secondary | ICD-10-CM | POA: Diagnosis not present

## 2021-08-24 DIAGNOSIS — M069 Rheumatoid arthritis, unspecified: Secondary | ICD-10-CM | POA: Diagnosis not present

## 2021-08-24 DIAGNOSIS — J449 Chronic obstructive pulmonary disease, unspecified: Secondary | ICD-10-CM | POA: Diagnosis not present

## 2021-08-24 LAB — CBC WITH DIFFERENTIAL (CANCER CENTER ONLY)
Abs Immature Granulocytes: 0.05 10*3/uL (ref 0.00–0.07)
Basophils Absolute: 0.1 10*3/uL (ref 0.0–0.1)
Basophils Relative: 1 %
Eosinophils Absolute: 0 10*3/uL (ref 0.0–0.5)
Eosinophils Relative: 1 %
HCT: 32.9 % — ABNORMAL LOW (ref 39.0–52.0)
Hemoglobin: 11 g/dL — ABNORMAL LOW (ref 13.0–17.0)
Immature Granulocytes: 1 %
Lymphocytes Relative: 46 %
Lymphs Abs: 1.6 10*3/uL (ref 0.7–4.0)
MCH: 32.4 pg (ref 26.0–34.0)
MCHC: 33.4 g/dL (ref 30.0–36.0)
MCV: 96.8 fL (ref 80.0–100.0)
Monocytes Absolute: 0.3 10*3/uL (ref 0.1–1.0)
Monocytes Relative: 9 %
Neutro Abs: 1.5 10*3/uL — ABNORMAL LOW (ref 1.7–7.7)
Neutrophils Relative %: 42 %
Platelet Count: 254 10*3/uL (ref 150–400)
RBC: 3.4 MIL/uL — ABNORMAL LOW (ref 4.22–5.81)
RDW: 13.7 % (ref 11.5–15.5)
WBC Count: 3.5 10*3/uL — ABNORMAL LOW (ref 4.0–10.5)
nRBC: 0 % (ref 0.0–0.2)

## 2021-08-24 MED ORDER — SODIUM CHLORIDE 0.9 % IV SOLN: Freq: Once | INTRAVENOUS | Status: AC

## 2021-08-24 MED ORDER — HEPARIN SOD (PORK) LOCK FLUSH 100 UNIT/ML IV SOLN
500.0000 [IU] | Freq: Once | INTRAVENOUS | Status: AC | PRN
  Administered 2021-08-24: 500 [IU]

## 2021-08-24 MED ORDER — SODIUM CHLORIDE 0.9% FLUSH
10.0000 mL | INTRAVENOUS | Status: DC | PRN
  Administered 2021-08-24: 10 mL

## 2021-08-24 MED ORDER — SODIUM CHLORIDE 0.9 % IV SOLN
1000.0000 mg/m2 | Freq: Once | INTRAVENOUS | Status: AC
  Administered 2021-08-24: 2470 mg via INTRAVENOUS
  Filled 2021-08-24: qty 52.6

## 2021-08-24 MED ORDER — PROCHLORPERAZINE MALEATE 10 MG PO TABS
10.0000 mg | ORAL_TABLET | Freq: Once | ORAL | Status: AC
  Administered 2021-08-24: 10 mg via ORAL
  Filled 2021-08-24: qty 1

## 2021-08-24 NOTE — Telephone Encounter (Signed)
-----   Message from Ladell Pier, MD sent at 08/24/2021  3:53 PM EST ----- Please call patient, neutrophil count mildly low today, check CBC in 1 week call for fever

## 2021-08-24 NOTE — Telephone Encounter (Signed)
TC to Pt discussed neutropenic precautions and to call if he should have any fevers. Pt verbalized understanding.

## 2021-08-24 NOTE — Progress Notes (Signed)
Patient presents for treatment. RN assessment completed along with the following:  Labs/vitals reviewed - Yes, and Per Dr. Benay Spice: okay to treat with just CBC today.    Weight within 10% of previous measurement - Yes Oncology Treatment Attestation completed for current therapy- Yes, on date 07/13/2021 Informed consent completed and reflects current therapy/intent - Yes, on date 07/06/2021             Provider progress note reviewed - Patient not seen by provider today. Most recent note dated 08/17/2021 reviewed. Treatment/Antibody/Supportive plan reviewed - Yes, and there are no adjustments needed for today's treatment. S&H and other orders reviewed - Yes, and there are no additional orders identified. Previous treatment date reviewed - Yes, and the appropriate amount of time has elapsed between treatments. Clinic Hand Off Received from - No  Patient to proceed with treatment.

## 2021-08-24 NOTE — Patient Instructions (Signed)
[facility]   Discharge Instructions: Thank you for choosing [facility] to provide your oncology and hematology care.   If you have a lab appointment with the [facility], please go directly to the [facility] and check in at the registration area.   Wear comfortable clothing and clothing appropriate for easy access to any Portacath or PICC line.   We strive to give you quality time with your provider. You may need to reschedule your appointment if you arrive late (15 or more minutes).  Arriving late affects you and other patients whose appointments are after yours.  Also, if you miss three or more appointments without notifying the office, you may be dismissed from the clinic at the provider's discretion.      For prescription refill requests, have your pharmacy contact our office and allow 72 hours for refills to be completed.    Today you received the following chemotherapy and/or immunotherapy agents Gemcitabine (GEMZAR).      To help prevent nausea and vomiting after your treatment, we encourage you to take your nausea medication as directed.  BELOW ARE SYMPTOMS THAT SHOULD BE REPORTED IMMEDIATELY: *FEVER GREATER THAN 100.4 F (38 C) OR HIGHER *CHILLS OR SWEATING *NAUSEA AND VOMITING THAT IS NOT CONTROLLED WITH YOUR NAUSEA MEDICATION *UNUSUAL SHORTNESS OF BREATH *UNUSUAL BRUISING OR BLEEDING *URINARY PROBLEMS (pain or burning when urinating, or frequent urination) *BOWEL PROBLEMS (unusual diarrhea, constipation, pain near the anus) TENDERNESS IN MOUTH AND THROAT WITH OR WITHOUT PRESENCE OF ULCERS (sore throat, sores in mouth, or a toothache) UNUSUAL RASH, SWELLING OR PAIN  UNUSUAL VAGINAL DISCHARGE OR ITCHING   Items with * indicate a potential emergency and should be followed up as soon as possible or go to the Emergency Department if any problems should occur.  Please show the CHEMOTHERAPY ALERT CARD or IMMUNOTHERAPY ALERT CARD at  check-in to the Emergency Department and triage nurse.  Should you have questions after your visit or need to cancel or reschedule your appointment, please contact [facility]  Dept: [phone]  and follow the prompts.  Office hours are 8:00 a.m. to 4:30 p.m. Monday - Friday. Please note that voicemails left after 4:00 p.m. may not be returned until the following business day.  We are closed weekends and major holidays. You have access to a nurse at all times for urgent questions. Please call the main number to the clinic Dept: [phone] and follow the prompts.   For any non-urgent questions, you may also contact your provider using MyChart. We now offer e-Visits for anyone 78 and older to request care online for non-urgent symptoms. For details visit [email or web address].   Also download the MyChart app! Go to the app store, search "MyChart", open the app, select [facility], and log in with your MyChart username and password.  Due to Covid, a mask is required upon entering the hospital/clinic. If you do not have a mask, one will be given to you upon arrival. For doctor visits, patients may have 1 support person aged 57 or older with them. For treatment visits, patients cannot have anyone with them due to current Covid guidelines and our immunocompromised population.   Gemcitabine injection What is this medication? GEMCITABINE (jem SYE ta been) is a chemotherapy drug. This medicine is used to treat many types of cancer like breast cancer, lung cancer, pancreatic cancer, and ovarian cancer. This medicine may be used for other purposes; ask your health care  provider or pharmacist if you have questions. COMMON BRAND NAME(S): Gemzar, Infugem What should I tell my care team before I take this medication? They need to know if you have any of these conditions: blood disorders infection kidney disease liver disease lung or breathing disease, like asthma recent or  ongoing radiation therapy an unusual or allergic reaction to gemcitabine, other chemotherapy, other medicines, foods, dyes, or preservatives pregnant or trying to get pregnant breast-feeding How should I use this medication? This drug is given as an infusion into a vein. It is administered in a hospital or clinic by a specially trained health care professional. Talk to your pediatrician regarding the use of this medicine in children. Special care may be needed. Overdosage: If you think you have taken too much of this medicine contact a poison control center or emergency room at once. NOTE: This medicine is only for you. Do not share this medicine with others. What if I miss a dose? It is important not to miss your dose. Call your doctor or health care professional if you are unable to keep an appointment. What may interact with this medication? medicines to increase blood counts like filgrastim, pegfilgrastim, sargramostim some other chemotherapy drugs like cisplatin vaccines Talk to your doctor or health care professional before taking any of these medicines: acetaminophen aspirin ibuprofen ketoprofen naproxen This list may not describe all possible interactions. Give your health care provider a list of all the medicines, herbs, non-prescription drugs, or dietary supplements you use. Also tell them if you smoke, drink alcohol, or use illegal drugs. Some items may interact with your medicine. What should I watch for while using this medication? Visit your doctor for checks on your progress. This drug may make you feel generally unwell. This is not uncommon, as chemotherapy can affect healthy cells as well as cancer cells. Report any side effects. Continue your course of treatment even though you feel ill unless your doctor tells you to stop. In some cases, you may be given additional medicines to help with side effects. Follow all directions for their use. Call your doctor or health care  professional for advice if you get a fever, chills or sore throat, or other symptoms of a cold or flu. Do not treat yourself. This drug decreases your body's ability to fight infections. Try to avoid being around people who are sick. This medicine may increase your risk to bruise or bleed. Call your doctor or health care professional if you notice any unusual bleeding. Be careful brushing and flossing your teeth or using a toothpick because you may get an infection or bleed more easily. If you have any dental work done, tell your dentist you are receiving this medicine. Avoid taking products that contain aspirin, acetaminophen, ibuprofen, naproxen, or ketoprofen unless instructed by your doctor. These medicines may hide a fever. Do not become pregnant while taking this medicine or for 6 months after stopping it. Women should inform their doctor if they wish to become pregnant or think they might be pregnant. Men should not father a child while taking this medicine and for 3 months after stopping it. There is a potential for serious side effects to an unborn child. Talk to your health care professional or pharmacist for more information. Do not breast-feed an infant while taking this medicine or for at least 1 week after stopping it. Men should inform their doctors if they wish to father a child. This medicine may lower sperm counts. Talk with your doctor or  health care professional if you are concerned about your fertility. What side effects may I notice from receiving this medication? Side effects that you should report to your doctor or health care professional as soon as possible: allergic reactions like skin rash, itching or hives, swelling of the face, lips, or tongue breathing problems pain, redness, or irritation at site where injected signs and symptoms of a dangerous change in heartbeat or heart rhythm like chest pain; dizziness; fast or irregular heartbeat; palpitations; feeling faint or  lightheaded, falls; breathing problems signs of decreased platelets or bleeding - bruising, pinpoint red spots on the skin, black, tarry stools, blood in the urine signs of decreased red blood cells - unusually weak or tired, feeling faint or lightheaded, falls signs of infection - fever or chills, cough, sore throat, pain or difficulty passing urine signs and symptoms of kidney injury like trouble passing urine or change in the amount of urine signs and symptoms of liver injury like dark yellow or brown urine; general ill feeling or flu-like symptoms; light-colored stools; loss of appetite; nausea; right upper belly pain; unusually weak or tired; yellowing of the eyes or skin swelling of ankles, feet, hands Side effects that usually do not require medical attention (report to your doctor or health care professional if they continue or are bothersome): constipation diarrhea hair loss loss of appetite nausea rash vomiting This list may not describe all possible side effects. Call your doctor for medical advice about side effects. You may report side effects to FDA at 1-800-FDA-1088. Where should I keep my medication? This drug is given in a hospital or clinic and will not be stored at home. NOTE: This sheet is a summary. It may not cover all possible information. If you have questions about this medicine, talk to your doctor, pharmacist, or health care provider.  2022 Elsevier/Gold Standard (2017-11-26 00:00:00)

## 2021-08-27 ENCOUNTER — Telehealth: Payer: Self-pay

## 2021-08-27 ENCOUNTER — Other Ambulatory Visit: Payer: Self-pay

## 2021-08-27 ENCOUNTER — Other Ambulatory Visit: Payer: Self-pay | Admitting: *Deleted

## 2021-08-27 MED ORDER — MAGIC MOUTHWASH: 5.0000 mL | Freq: Four times a day (QID) | ORAL | 0 refills | Status: DC | PRN

## 2021-08-27 NOTE — Telephone Encounter (Signed)
Magic mouthwash called in to [facility] [phone]

## 2021-08-29 ENCOUNTER — Other Ambulatory Visit: Payer: Self-pay

## 2021-08-29 ENCOUNTER — Inpatient Hospital Stay: Payer: BC Managed Care – PPO | Admitting: Nutrition

## 2021-08-29 ENCOUNTER — Telehealth: Payer: Self-pay | Admitting: Nutrition

## 2021-08-29 ENCOUNTER — Inpatient Hospital Stay: Payer: BC Managed Care – PPO

## 2021-08-29 DIAGNOSIS — Z5111 Encounter for antineoplastic chemotherapy: Secondary | ICD-10-CM | POA: Diagnosis not present

## 2021-08-29 DIAGNOSIS — M069 Rheumatoid arthritis, unspecified: Secondary | ICD-10-CM | POA: Diagnosis not present

## 2021-08-29 DIAGNOSIS — J449 Chronic obstructive pulmonary disease, unspecified: Secondary | ICD-10-CM | POA: Diagnosis not present

## 2021-08-29 DIAGNOSIS — C119 Malignant neoplasm of nasopharynx, unspecified: Secondary | ICD-10-CM

## 2021-08-29 DIAGNOSIS — F17210 Nicotine dependence, cigarettes, uncomplicated: Secondary | ICD-10-CM | POA: Diagnosis not present

## 2021-08-29 DIAGNOSIS — E039 Hypothyroidism, unspecified: Secondary | ICD-10-CM | POA: Diagnosis not present

## 2021-08-29 DIAGNOSIS — Z79899 Other long term (current) drug therapy: Secondary | ICD-10-CM | POA: Diagnosis not present

## 2021-08-29 DIAGNOSIS — E119 Type 2 diabetes mellitus without complications: Secondary | ICD-10-CM | POA: Diagnosis not present

## 2021-08-29 LAB — CBC WITH DIFFERENTIAL (CANCER CENTER ONLY)
Abs Immature Granulocytes: 0.02 10*3/uL (ref 0.00–0.07)
Basophils Absolute: 0.1 10*3/uL (ref 0.0–0.1)
Basophils Relative: 1 %
Eosinophils Absolute: 0 10*3/uL (ref 0.0–0.5)
Eosinophils Relative: 1 %
HCT: 31.4 % — ABNORMAL LOW (ref 39.0–52.0)
Hemoglobin: 10.5 g/dL — ABNORMAL LOW (ref 13.0–17.0)
Immature Granulocytes: 0 %
Lymphocytes Relative: 23 %
Lymphs Abs: 1.5 10*3/uL (ref 0.7–4.0)
MCH: 32.7 pg (ref 26.0–34.0)
MCHC: 33.4 g/dL (ref 30.0–36.0)
MCV: 97.8 fL (ref 80.0–100.0)
Monocytes Absolute: 0.2 10*3/uL (ref 0.1–1.0)
Monocytes Relative: 3 %
Neutro Abs: 4.6 10*3/uL (ref 1.7–7.7)
Neutrophils Relative %: 72 %
Platelet Count: 129 10*3/uL — ABNORMAL LOW (ref 150–400)
RBC: 3.21 MIL/uL — ABNORMAL LOW (ref 4.22–5.81)
RDW: 13.9 % (ref 11.5–15.5)
WBC Count: 6.3 10*3/uL (ref 4.0–10.5)
nRBC: 0 % (ref 0.0–0.2)

## 2021-08-29 NOTE — Telephone Encounter (Signed)
Telephone follow up completed with patient.  Contacted patient who reports he is on his way to get lab work completed. States his white cell count was low and they want to recheck it.  Patient reports he will have a PET on the 20th. 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 Current weight documented as 265 pounds which is stable. Labs reviewed. WBC 6.3. Patient denies nutrition impact symptoms. He has no questions today.  Nutrition Diagnosis: Predicted sub-optimal energy intake continues.  Intervention: Educated patient to continue good oral intake, especially of protein containing foods. Will follow patient when new chemo/radiation begins. Patient has contact information.  Monitoring, Evaluation, Goals Wt trends and nutrition impact symptoms  Next Visit: To be scheduled.

## 2021-08-29 NOTE — Progress Notes (Signed)
See telephone note.

## 2021-09-02 ENCOUNTER — Other Ambulatory Visit: Payer: Self-pay | Admitting: Oncology

## 2021-09-04 ENCOUNTER — Ambulatory Visit (HOSPITAL_COMMUNITY)
Admission: RE | Admit: 2021-09-04 | Discharge: 2021-09-04 | Disposition: A | Discharge status: Home / Self Care | Payer: BC Managed Care – PPO | Source: Ambulatory Visit | Attending: Oncology | Admitting: Oncology

## 2021-09-04 ENCOUNTER — Other Ambulatory Visit: Payer: Self-pay

## 2021-09-04 ENCOUNTER — Encounter (HOSPITAL_COMMUNITY): Payer: Self-pay

## 2021-09-04 DIAGNOSIS — C119 Malignant neoplasm of nasopharynx, unspecified: Secondary | ICD-10-CM | POA: Insufficient documentation

## 2021-09-04 MED ORDER — FLUDEOXYGLUCOSE F - 18 (FDG) INJECTION: 13.0000 | Freq: Once | INTRAVENOUS | Status: DC | PRN

## 2021-09-05 DIAGNOSIS — G894 Chronic pain syndrome: Secondary | ICD-10-CM | POA: Diagnosis not present

## 2021-09-05 DIAGNOSIS — E063 Autoimmune thyroiditis: Secondary | ICD-10-CM | POA: Diagnosis not present

## 2021-09-05 DIAGNOSIS — Z6836 Body mass index (BMI) 36.0-36.9, adult: Secondary | ICD-10-CM | POA: Diagnosis not present

## 2021-09-05 DIAGNOSIS — E119 Type 2 diabetes mellitus without complications: Secondary | ICD-10-CM | POA: Diagnosis not present

## 2021-09-05 DIAGNOSIS — I10 Essential (primary) hypertension: Secondary | ICD-10-CM | POA: Diagnosis not present

## 2021-09-05 DIAGNOSIS — E6609 Other obesity due to excess calories: Secondary | ICD-10-CM | POA: Diagnosis not present

## 2021-09-05 DIAGNOSIS — F411 Generalized anxiety disorder: Secondary | ICD-10-CM | POA: Diagnosis not present

## 2021-09-05 DIAGNOSIS — M1991 Primary osteoarthritis, unspecified site: Secondary | ICD-10-CM | POA: Diagnosis not present

## 2021-09-06 ENCOUNTER — Inpatient Hospital Stay: Payer: BC Managed Care – PPO | Admitting: Oncology

## 2021-09-14 ENCOUNTER — Other Ambulatory Visit: Payer: Self-pay

## 2021-09-14 ENCOUNTER — Ambulatory Visit (HOSPITAL_COMMUNITY)
Admission: RE | Admit: 2021-09-14 | Discharge: 2021-09-14 | Disposition: A | Discharge status: Home / Self Care | Payer: BC Managed Care – PPO | Source: Ambulatory Visit | Attending: Oncology | Admitting: Oncology

## 2021-09-14 DIAGNOSIS — C119 Malignant neoplasm of nasopharynx, unspecified: Secondary | ICD-10-CM | POA: Diagnosis not present

## 2021-09-14 DIAGNOSIS — Z08 Encounter for follow-up examination after completed treatment for malignant neoplasm: Secondary | ICD-10-CM | POA: Diagnosis not present

## 2021-09-14 DIAGNOSIS — J432 Centrilobular emphysema: Secondary | ICD-10-CM | POA: Diagnosis not present

## 2021-09-14 DIAGNOSIS — K4020 Bilateral inguinal hernia, without obstruction or gangrene, not specified as recurrent: Secondary | ICD-10-CM | POA: Diagnosis not present

## 2021-09-14 DIAGNOSIS — J392 Other diseases of pharynx: Secondary | ICD-10-CM | POA: Diagnosis not present

## 2021-09-14 LAB — GLUCOSE, CAPILLARY: Glucose-Capillary: 179 mg/dL — ABNORMAL HIGH (ref 70–99)

## 2021-09-14 MED ORDER — FLUDEOXYGLUCOSE F - 18 (FDG) INJECTION
14.3000 | Freq: Once | INTRAVENOUS | Status: AC | PRN
  Administered 2021-09-14: 12:00:00 14.3 via INTRAVENOUS

## 2021-09-18 ENCOUNTER — Other Ambulatory Visit: Payer: Self-pay | Admitting: *Deleted

## 2021-09-18 ENCOUNTER — Encounter: Payer: Self-pay | Admitting: *Deleted

## 2021-09-18 ENCOUNTER — Encounter: Payer: Self-pay | Admitting: Nurse Practitioner

## 2021-09-18 ENCOUNTER — Inpatient Hospital Stay: Payer: BC Managed Care – PPO | Admitting: Nurse Practitioner

## 2021-09-18 ENCOUNTER — Other Ambulatory Visit: Payer: Self-pay

## 2021-09-18 ENCOUNTER — Ambulatory Visit: Payer: BC Managed Care – PPO | Admitting: Oncology

## 2021-09-18 VITALS — BP 118/76 | HR 96 | Temp 98.1°F | Resp 20 | Ht 73.0 in | Wt 272.0 lb

## 2021-09-18 DIAGNOSIS — M069 Rheumatoid arthritis, unspecified: Secondary | ICD-10-CM | POA: Insufficient documentation

## 2021-09-18 DIAGNOSIS — J449 Chronic obstructive pulmonary disease, unspecified: Secondary | ICD-10-CM | POA: Insufficient documentation

## 2021-09-18 DIAGNOSIS — Z510 Encounter for antineoplastic radiation therapy: Secondary | ICD-10-CM | POA: Insufficient documentation

## 2021-09-18 DIAGNOSIS — Z5111 Encounter for antineoplastic chemotherapy: Secondary | ICD-10-CM | POA: Insufficient documentation

## 2021-09-18 DIAGNOSIS — C119 Malignant neoplasm of nasopharynx, unspecified: Secondary | ICD-10-CM | POA: Insufficient documentation

## 2021-09-18 DIAGNOSIS — D693 Immune thrombocytopenic purpura: Secondary | ICD-10-CM | POA: Insufficient documentation

## 2021-09-18 DIAGNOSIS — C111 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: Secondary | ICD-10-CM | POA: Diagnosis not present

## 2021-09-18 DIAGNOSIS — F17210 Nicotine dependence, cigarettes, uncomplicated: Secondary | ICD-10-CM | POA: Insufficient documentation

## 2021-09-18 DIAGNOSIS — E039 Hypothyroidism, unspecified: Secondary | ICD-10-CM | POA: Insufficient documentation

## 2021-09-18 DIAGNOSIS — Z79899 Other long term (current) drug therapy: Secondary | ICD-10-CM | POA: Insufficient documentation

## 2021-09-18 DIAGNOSIS — E119 Type 2 diabetes mellitus without complications: Secondary | ICD-10-CM | POA: Insufficient documentation

## 2021-09-18 NOTE — Progress Notes (Signed)
PATIENT NAVIGATOR PROGRESS NOTE  Name: Dustin [name] Date: 09/18/2021 MRN: [id number]  DOB: November 27, 1960   Reason for visit:  F/U med onc  Comments:  Review of PET scan and referral for reconsult to Dr Isidore Moos    Time spent counseling/coordinating care: 15-30 minutes

## 2021-09-18 NOTE — Progress Notes (Signed)
DISCONTINUE OFF PATHWAY REGIMEN - Head and Neck   [id number]:Carboplatin + Gemcitabine (5/1,000) q21 Days:   A cycle is every 21 days:     Carboplatin      Gemcitabine   **Always confirm dose/schedule in your pharmacy ordering system**  REASON: Other Reason PRIOR TREATMENT: Off Pathway: Carboplatin + Gemcitabine (5/1,000) q21 Days TREATMENT RESPONSE: Partial Response (PR)  START OFF PATHWAY REGIMEN - Head and Neck   [id number]:Carboplatin AUC=1.5 Weekly + RT:   Administer weekly:     Carboplatin   **Always confirm dose/schedule in your pharmacy ordering system**  Patient Characteristics: Nasopharyngeal, Stage II - IVA Disease Classification: Nasopharyngeal Current Disease Status: No Distant Metastases and No Recurrent Disease AJCC T Category: T3 AJCC N Category: N2 AJCC M Category: M0 AJCC 8 Stage Grouping: III Intent of Therapy: Curative Intent, Discussed with Patient

## 2021-09-18 NOTE — Progress Notes (Signed)
[facility] OFFICE PROGRESS NOTE   Diagnosis: Nasopharynx cancer  INTERVAL HISTORY:   Mr. Kurka returns as scheduled.  He completed a cycle of carboplatin/gemcitabine beginning 08/17/2021.  He denies nausea/vomiting.  No fever or rash after treatment.  No dysphagia.  No headaches.  Objective:  Vital signs in last 24 hours:  Blood pressure 118/76, pulse 96, temperature 98.1 F (36.7 C), temperature source Oral, resp. rate 20, height 6\' 1"  (1.854 m), weight 272 lb (123.4 kg), SpO2 100 %.    HEENT: No thrush or ulcers. Resp: Lungs clear bilaterally. Cardio: Regular rate and rhythm. GI: Abdomen soft and nontender.  No hepatomegaly. Vascular: No leg edema. Port-A-Cath without erythema  Lab Results:  Lab Results  Component Value Date   WBC 6.3 08/29/2021   HGB 10.5 (L) 08/29/2021   HCT 31.4 (L) 08/29/2021   MCV 97.8 08/29/2021   PLT 129 (L) 08/29/2021   NEUTROABS 4.6 08/29/2021    Imaging:  No results found.  Medications: I have reviewed the patient's current medications.  Assessment/Plan: Nasopharyngeal cancer, presenting with left neck adenopathy MRI cervical spine 04/27/2021-bilateral enlarged upper jugular chain lymph nodes.  Right level 2 node measures 18 mm CT neck 05/25/2021-bilateral nasopharyngeal mass eroding the skull base at the level of the clivus and inferior sphenoid sinus, enlarged right jugular digastric node into enlarged left level 2 nodes, mild soft tissue density in the inferior left sphenoid sinus-tumor? 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 MRI face 06/19/2021-extensive tumor invasion of the skull base, no trigeminal or other cranial nerve involvement, abnormal bilateral level 2 nodes PET 06/19/2021-decrease size of nasopharyngeal mass compared to 05/25/2021 CT, invasion of the clivus, single bilateral level 2A lymph nodes mildly enlarged and hypermetabolic, no evidence of distant metastatic disease Cycle 1  gemcitabine/carboplatin 07/06/2021, day 8 gemcitabine 07/05/2021 Cycle 2 gemcitabine/carboplatin 07/27/2021 Cycle 3 gemcitabine/carboplatin 08/17/2021 PET scan 09/14/2021-decrease in size and degree of FDG uptake associated with FDG avid posterior nasopharyngeal lesion.  Residual FDG uptake along the midline of the posterior nasopharynx.  Persistent but improved asymmetric increased uptake within the right tonsillar region.  Resolution of previous FDG avid cervical lymph nodes. 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 Rhew appears well.  He has completed 3 cycles of gemcitabine/carboplatin.  Restaging PET scan shows improvement.  Dr. Benay Spice reviewed the PET results/images with him at today's visit and recommends proceeding with radiation and weekly carboplatin.  Mr. Yi agrees with this plan.  We will refer him back to Dr. Isidore Moos.  He will return for lab, follow-up, weekly carboplatin the week of 10/01/2021.  We are available to see him sooner if needed.  Patient seen with Dr. Benay Spice.    Ned Card ANP/GNP-BC   09/18/2021  1:45 PM  This was a shared visit with Ned Card.  Mr. Corrales completed 3 cycles of induction therapy with gemcitabine and carboplatin.  He tolerated the treatment well.  A restaging CT reveals significant improvement in the nasopharynx mass and FDG avid cervical lymph nodes.  He has experienced clinical improvement with resolution of headaches.  He will see Dr. Isidore Moos to plan definitive radiation.  I recommend concurrent carboplatin chemotherapy.  He is not a candidate for cisplatin due to hearing loss.  Mr. Herrman agrees to proceed with radiation and carboplatin.  I will defer decision on feeding tube placement to Dr. Isidore Moos.  A chemotherapy plan was entered today.  I was present for greater than  50% of today's visit.  I performed medical decision making.  Julieanne Manson, MD

## 2021-09-19 ENCOUNTER — Encounter: Payer: Self-pay | Admitting: Oncology

## 2021-09-21 ENCOUNTER — Other Ambulatory Visit: Payer: Self-pay

## 2021-09-21 DIAGNOSIS — C119 Malignant neoplasm of nasopharynx, unspecified: Secondary | ICD-10-CM

## 2021-09-21 NOTE — Progress Notes (Signed)
Head and Neck Cancer Location of Tumor / Histology:  Nasopharynx carcinoma  Biopsies revealed:  06/04/2021 FINAL MICROSCOPIC DIAGNOSIS:  A. NASOPHARYNGEAL MASS, EXCISION:  - Squamous cell carcinoma ADDENDUM:  In situ hybridization for Epstein-Barr virus (EBV) is negative  Restaging PET Scan 09/14/2021 --IMPRESSION: Interval response to therapy. Decrease in size and degree of FDG uptake associated with FDG avid posterior nasopharyngeal lesion. Residual FDG uptake along the midline of the posterior nasopharynx has an SUV max of 6.08, which is concerning for residual disease. Persistent but improved asymmetric increased uptake within the right tonsillar region is also noted and concerning for residual disease. Resolution of previous FDG avid cervical lymph nodes. No signs of FDG avid metastatic disease to the chest, abdomen or pelvis.  Nutrition Status Yes No Comments  Weight changes? []  [x]    Swallowing concerns? []  [x]    PEG? [x]  []  Placement TBD   Referrals Yes No Comments  Social Work? [x]  []    Dentistry? [x]  []    Swallowing therapy? [x]  []    Nutrition? [x]  []    Med/Onc? [x]  []  Dr. [name]   Safety Issues Yes No Comments  Prior radiation? []  [x]    Pacemaker/ICD? []  [x]    Possible current pregnancy? []  [x]  N/A  Is the patient on methotrexate? [x]  []  Not taking currently--20 mg PO weekly; last dose 06/09/2021   Tobacco/Marijuana/Snuff/ETOH use: Quit smoking ~2.5 months ago; was a current every day smoker (smoked about 1 pack/day). Denies any smokeless tobacco use, alcohol consumption, or recreational drug use.   Past/Anticipated interventions by otolaryngology, if any:  06/04/2021 Dr. Izora Gala --[name]  Past/Anticipated interventions by medical oncology, if any:  Under care of Dr. [name]  09/18/2021 (office note from Marlynn Perking) Dustin [name] appears well.  He has completed 3 cycles of  gemcitabine/carboplatin.   Cycle 1 gemcitabine/carboplatin 07/06/2021 Cycle 2 gemcitabine/carboplatin 07/27/2021 Cycle 3 gemcitabine/carboplatin 08/17/2021 Restaging PET scan shows improvement.  Dr. Benay Spice reviewed the PET results/images with him at today's visit and recommends proceeding with radiation and weekly carboplatin.  Dustin [name] agrees with this plan.   We will refer him back to Dr. Isidore Moos.   He will return for lab, follow-up, weekly carboplatin the week of 10/01/2021. We are available to see him sooner if needed.  Current Complaints / other details:  Nothing else of note

## 2021-09-23 NOTE — Progress Notes (Signed)
Radiation Oncology         [phone] ________________________________  Follow-up New Visit   Outpatient  Name: Dustin [name] MRN: [id number]  Date: 09/24/2021  DOB: Aug 22, 1961  CC:Redmond School, MD  Ladell Pier, MD   REFERRING PHYSICIAN: Ladell Pier, MD  DIAGNOSIS: C11.1   ICD-10-CM   1. 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 Since the patient was last seen for his initial consultation on 06/20/21, the patient was scheduled to begin chemotherapy consisting of gemcitabine/cisplatin on 07/02/21. However, the patient was found to have severe high-frequency hearing loss on audiology evaluation. Given this finding, Dr. Benay Spice consulted with myself and we decided to to initiate induction therapy with 5-FU/carboplatin while waiting on radiation planning (as cisplatin can worsen hearing loss further). Evidently, we were not able to get the patient in for radiation as soon as we would have liked to. Subsequently, the patient proceeded to undergo chemotherapy consisting of gemzar and carboplatin starting on 07/06/21. The plan would have been for concurrent chemoradiation with carboplatin if the patient was seen sooner.   During follow up with Dr. Ammie Dalton on 07/13/21, the patient was noted to tolerate chemotherapy well other than mild headaches. Dr. Benay Spice also noted the left anterior cervical lymph node to appear small on examination.   During  follow up with Dr. Benay Spice on 07/27/21, prior to starting cycle 2 of chemo, the patient reported near complete resolution of headaches. The patient did report that he could feel his breathing in both ears, and could resolve this by closing his nose and blowing. No change in his hearing was otherwise reported.   Prior to starting cycle 3 on 08/17/21, the patient reported that he could no longer palpate the left neck lymph node.   Restaging PET on 09/14/21 demonstrated an interval response to therapy, defined by a decrease in size and degree of FDG uptake associated with the FDG avid posterior nasopharyngeal lesion. Residual FDG uptake was appreciated along the midline of the posterior nasopharynx, which demonstrated an SUV max of 6.08, and noted as concerning for residual disease. Persistent but improved asymmetric increased uptake was also noted within the right tonsillar region; also noted and concerning for residual disease. PET also confirmed resolution of previous FDG avid cervical lymph nodes. No signs of FDG avid metastatic disease to the chest, abdomen or pelvis were otherwise appreciated.  I personally reviewed his images.  In recent history, the patient followed up with Dr. Benay Spice and his NP, Ned Card, on 09/18/20. Dr. Benay Spice reviewed the PET results/images with the patient and recommended that he proceed with radiation and weekly carboplatin. The patient will return for labs and follow up with Dr. Benay Spice on the week of 10/01/2021.      PREVIOUS HISTORY OF PRESENT ILLNESS::Dustin [name] is a 61 y.o. male who presented with a lump in his left neck, first noticed this past July/August, to Dr. Gerarda Fraction. He was prescribed antibiotics at that time without  any effect. Of note: MRI of cervical spine performed on 04/27/21 (ordered due to neck pain following surgery 3 months prior) revealed bilateral upper jugular lymphadenopathy.   Soft tissue neck CT performed on 05/25/21 revealed an aggressive  nasopharyngeal tumor with bilateral lymphadenopathy and skull base erosion.  Subsequently, the patient saw Dr. Constance Holster on 06/01/21 who performed fiberoptic nasal endoscopy. Endoscopy revealed a nasopharyngeal mass defined by irregular fullness left greater than right in the nasopharynx, covered by mucosa involving the superior nasopharynx. Dr. Constance Holster noted these findings accompanied by enlarged lymph nodes in bilateral neck as very suspicious for nasopharyngeal carcinoma. Patient was recommended to undergo biopsy.  Biopsy of the nasopharyngeal mass on 06/04/21 revealed: squamous cell carcinoma, EBV negative  Pertinent imaging thus far includes  (I personally reviewed his imaging at tumor board today) --PET performed on 06/19/21 revealing Substantially reduced size of the posterior nasopharyngeal mass. Current maximum SUV is 12.5, compatible with malignancy. There still adjacent sclerosis and irregularity in the clivus probably from some degree of local invasion. Bilateral single level IIa lymph nodes are mildly enlarged and hypermetabolic as detailed above, compatible with malignant involvement.. No findings of metastatic disease to the chest, abdomen, or pelvis.Marland Kitchen    --Trigeminal MRI on 06/19/21 revealing extensive tumor invasion of the skull base, throughout an area of roughly 4 cm in the midline including the entire clivus, posterior sphenoid sinuses, and the floor of the bony sella. No trigeminal or other distinct cranial nerve involvement was otherwise seen. MRI also again revealed the the primary bulky nasopharyngeal soft tissue mass measuring roughly 2 cm, as well as the visible abnormal bilateral level 2 nodes; both appeared stable since imaging last month.  Patient met with Dr. Learta Codding on 06/12/21 to discuss treatment options. The patient opted to proceed with treatment consisting of  induction gemcitabine and cisplatin. The patient is planned to begin treatment during the week of 06/25/2021.     PREVIOUS RADIATION THERAPY: No  PAST MEDICAL HISTORY:  has a past medical history of Anxiety, COPD (chronic obstructive pulmonary disease) ([place]), Depression, Essential hypertension, GERD (gastroesophageal reflux disease), H/O hiatal hernia, History of cardiac catheterization, History of ITP, Hypothyroidism, Osteoarthritis, and Type 2 diabetes mellitus ([place]).    PAST SURGICAL HISTORY: Past Surgical History:  Procedure Laterality Date   ANTERIOR CERVICAL DECOMP/DISCECTOMY FUSION  01/21/2012   Procedure: ANTERIOR CERVICAL DECOMPRESSION/DISCECTOMY FUSION 2 LEVELS;  Surgeon: Erline Levine, MD;  Location: [facility];  Service: Neurosurgery;  Laterality: N/A;  Cervical Six-Seven, Cervical Seven-Thoracic One, Anterior Cervical Decompression with Fusion Interbody Prothesis Plating and Bonegraft possible posterior Cervical Seven-Thoracic One Foraminotomy   CARDIAC CATHETERIZATION  [phone]   normal coronary arteries   CARDIAC CATHETERIZATION N/A 01/22/2016   Procedure: Left Heart Cath and Coronary Angiography;  Surgeon: Burnell Blanks, MD;  Location: [facility];  Service: Cardiovascular;  Laterality: N/A;   CARPAL TUNNEL RELEASE Bilateral    CERVICAL DISC SURGERY  04   CHOLECYSTECTOMY N/A 10/26/2014   Procedure: LAPAROSCOPIC CHOLECYSTECTOMY;  Surgeon: Jamesetta So, MD;  Location: [facility];  Service: General;  Laterality: N/A;   ESOPHAGOGASTRODUODENOSCOPY N/A 05/03/2016   Procedure: ESOPHAGOGASTRODUODENOSCOPY (EGD);  Surgeon: Rogene Houston, MD;  Location: [facility];  Service: Endoscopy;  Laterality: N/A;  10:30   HERNIA REPAIR Right 60's   IR IMAGING GUIDED PORT INSERTION  06/27/2021   Left elbow bursa removed     NASOPHARYNGOSCOPY N/A 06/04/2021   Procedure: NASAL ENDOSCOPY WITH BIOPSY OF NASOPHARYNX; FROZEN SECTION;  Surgeon:  Izora Gala, MD;  Location: [facility] OR;  Service: ENT;  Laterality: N/A;   TONSILLECTOMY  38    FAMILY HISTORY: family history includes COPD in his mother;  Diabetes in his brother; Heart attack in his brother, brother, and father; Heart attack (age of onset: 10) in his brother; Heart attack (age of onset: 29) in his sister; Heart disease in his brother, mother, and sister; Heart disease (age of onset: 31) in his brother; Kidney disease in his sister.  SOCIAL HISTORY:  reports that he quit smoking about 2 months ago. His smoking use included cigarettes. He started smoking about 47 years ago. He has a 30.00 pack-year smoking history. He has never used smokeless tobacco. He reports that he does not drink alcohol and does not use drugs.  ALLERGIES: Patient has no known allergies.  MEDICATIONS:  Current Outpatient Medications  Medication Sig Dispense Refill   ALPRAZolam (XANAX) 1 MG tablet Take 1 mg by mouth 3 (three) times daily as needed for anxiety.     atorvastatin (LIPITOR) 10 MG tablet Take 10 mg by mouth at bedtime.     Blood Glucose Monitoring Suppl ([place] FLEX SYSTEM) w/Device KIT daily. as directed     buPROPion (WELLBUTRIN SR) 150 MG 12 hr tablet Start one week before quit date. Take 1 tab daily x 3 days, then 1 tab BID thereafter. 60 tablet 2   Cholecalciferol (DIALYVITE VITAMIN D 5000 PO) Take 1 capsule by mouth daily.     COVID-19 mRNA bivalent vaccine, Moderna, (MODERNA COVID-19 BIVAL BOOSTER) 50 MCG/0.5ML injection Inject into the muscle. 0.5 mL 0   folic acid (FOLVITE) 1 MG tablet TAKE 1 TABLET BY MOUTH TWICE DAILY 180 tablet 2   influenza vac split quadrivalent PF (FLUARIX) 0.5 ML injection Inject into the muscle. 0.5 mL 0   levothyroxine (SYNTHROID) 50 MCG tablet Take 50 mcg by mouth at bedtime.     lidocaine-prilocaine (EMLA) cream Apply 1 application topically as needed. 30 g 0   lisinopril (ZESTRIL) 2.5 MG tablet Take by mouth.     magic mouthwash SOLN Take 5 mLs by mouth 4 (four) times daily as needed for mouth pain. 240 mL 0   metFORMIN (GLUCOPHAGE) 500 MG tablet Take 500 mg by mouth 2 (two) times daily with a meal.      methotrexate (RHEUMATREX) 2.5 MG tablet TAKE EIGHT TABLETS BY MOUTH ONCE WEEKLY (Patient not taking: Reported on 06/20/2021) 96 tablet 0   nicotine (NICODERM CQ - DOSED IN MG/24 HOURS) 14 mg/24hr patch Place 1 patch (14 mg total) onto the skin daily. Apply 21 mg patch daily x 6 wk, then $Remove'14mg'cBtLUwA$  patch daily x 2 wk, then 7 mg patch daily x 2 wk 14 patch 0   nicotine (NICODERM CQ - DOSED IN MG/24 HOURS) 21 mg/24hr patch Place 1 patch (21 mg total) onto the skin daily. Apply 21 mg patch daily x 6 wk, then $Remove'14mg'ypBFHHC$  patch daily x 2 wk, then 7 mg patch daily x 2 wk 14 patch 2   nicotine (NICODERM CQ - DOSED IN MG/24 HR) 7 mg/24hr patch Place 1 patch (7 mg total) onto the skin daily. Apply 21 mg patch daily x 6 wk, then $Remove'14mg'dTyzpHt$  patch daily x 2 wk, then 7 mg patch daily x 2 wk 14 patch 0   nystatin (MYCOSTATIN) 100000 UNIT/ML suspension Take 10 mLs by mouth in the morning, at noon, in the evening, and at bedtime.     ondansetron (ZOFRAN) 8 MG tablet TAKE  1 TAB BY MOUTH EVERY 8 HOURS AS NEEDED FOR NAUSEA OR VOMITING. START 72 HOURS AFTER IV ALOXI IS GIVEN IN CLINIC 24 tablet 2   ONETOUCH VERIO test strip daily.     oxyCODONE (OXY IR/ROXICODONE) 5 MG immediate release tablet Take 1-1.5 tablets (5-7.5 mg total) by mouth every 8 (eight) hours as needed for severe pain. 30 tablet 0   pantoprazole (PROTONIX) 40 MG tablet Take 40 mg by mouth at bedtime.     prochlorperazine (COMPAZINE) 10 MG tablet TAKE 1 TABLET BY MOUTH EVERY 6 HOURS AS NEEDED FOR NAUSEA OR VOMITING. 30 tablet 3   sildenafil (VIAGRA) 25 MG tablet Take 25 mg by mouth daily as needed for erectile dysfunction.     zolpidem (AMBIEN) 10 MG tablet Take 10 mg by mouth at bedtime as needed for sleep. Reported on 01/20/2016     No current facility-administered medications for this encounter.    REVIEW OF SYSTEMS:  Notable for that above.   PHYSICAL EXAM:  height is $RemoveB'6\' 1"'uSnpUcMb$  (1.854 m) and weight is 273 lb (123.8 kg). His temperature is 98.1 F (36.7 C). His blood  pressure is 100/66 and his pulse is 91. His respiration is 20 and oxygen saturation is 94%.   General: Alert and oriented, in no acute distress Psychiatric: Judgment and insight are intact. Affect is appropriate.     LABORATORY DATA:  Lab Results  Component Value Date   WBC 6.3 08/29/2021   HGB 10.5 (L) 08/29/2021   HCT 31.4 (L) 08/29/2021   MCV 97.8 08/29/2021   PLT 129 (L) 08/29/2021   CMP     Component Value Date/Time   NA 136 08/17/2021 0917   NA 143 01/12/2021 1113   K 4.1 08/17/2021 0917   CL 100 08/17/2021 0917   CO2 26 08/17/2021 0917   GLUCOSE 243 (H) 08/17/2021 0917   BUN 20 09/24/2021 0843   BUN 22 01/12/2021 1113   CREATININE 0.95 09/24/2021 0843   CREATININE 1.00 04/18/2017 1056   CALCIUM 9.8 08/17/2021 0917   PROT 6.9 08/17/2021 0917   PROT 7.0 01/12/2021 1113   ALBUMIN 4.5 08/17/2021 0917   ALBUMIN 4.3 01/12/2021 1113   AST 28 08/17/2021 0917   ALT 41 08/17/2021 0917   ALKPHOS 45 08/17/2021 0917   BILITOT 0.9 08/17/2021 0917   GFRNONAA >60 09/24/2021 0843   GFRNONAA 83 04/18/2017 1056   GFRAA 108 09/29/2020 1004   GFRAA >89 04/18/2017 1056      Lab Results  Component Value Date   TSH 2.866 09/24/2021     RADIOGRAPHY: NM PET Image Restag (PS) Skull Base To Thigh  Result Date: 09/18/2021 CLINICAL DATA:  Subsequent treatment strategy for nasopharyngeal carcinoma. EXAM: NUCLEAR MEDICINE PET SKULL BASE TO THIGH TECHNIQUE: 14.3 mCi F-18 FDG was injected intravenously. Full-ring PET imaging was performed from the skull base to thigh after the radiotracer. CT data was obtained and used for attenuation correction and anatomic localization. Fasting blood glucose: 179 mg/dl COMPARISON:  06/19/2021 FINDINGS: Mediastinal blood pool activity: SUV max 3.32 Liver activity: SUV max NA NECK: Interval decrease in size of previous FDG avid soft tissue within the posterior nasopharynx. Previously referenced abnormal increased soft tissue within the midline. On today's study  tumor measures approximately 1.1 x 1.4 cm and has an SUV max of 6.08, image 20/4. On the previous exam this area measured 1.8 x 5.4 cm and had an SUV max of 12.49. Asymmetric increased uptake within the right tonsillar region is equal  to 5.73 on today's exam, image 24/4. On the previous exam this measured 12.49. Interval resolution of previous tracer avid bilateral cervical lymph nodes. Incidental CT findings: none CHEST: No hypermetabolic mediastinal or hilar nodes. No suspicious pulmonary nodules on the CT scan. No FDG avid pulmonary nodule or mass identified. Incidental CT findings: Aortic atherosclerosis. Centrilobular and paraseptal emphysema. ABDOMEN/PELVIS: No abnormal hypermetabolic activity within the liver, pancreas, adrenal glands, or spleen. No hypermetabolic lymph nodes in the abdomen or pelvis. Incidental CT findings: Status post cholecystectomy. Nonobstructing right kidney stone measures 3 mm. Aortic atherosclerotic calcifications. Sigmoid diverticulosis. Small fat containing inguinal hernias noted bilaterally. SKELETON: No focal hypermetabolic activity to suggest skeletal metastasis. Incidental CT findings: none IMPRESSION: 1. Interval response to therapy. 2. Decrease in size and degree of FDG uptake associated with FDG avid posterior nasopharyngeal lesion. Residual FDG uptake along the midline of the posterior nasopharynx has an SUV max of 6.08, which is concerning for residual disease. Persistent but improved asymmetric increased uptake within the right tonsillar region is also noted and concerning for residual disease. 3. Resolution of previous FDG avid cervical lymph nodes. 4. No signs of FDG avid metastatic disease to the chest, abdomen or pelvis. Electronically Signed   By: Kerby Moors M.D.   On: 09/18/2021 11:31      IMPRESSION/PLAN:  This is a delightful patient with head and neck cancer of the nasopharynx with favorable response to induction Gem/carbo systemic therapy. At this point,  I recommend radiotherapy for this patient to the primary site and bilateral neck with curative intent (7 wks) and he anticipates concurrent chemotherapy Norma Fredrickson) as discussed with med/onc. Chemotherapy plan is taking his prior hearing loss into consideration.  We discussed the potential risks, benefits, and side effects of radiotherapy. We talked in detail about acute and late effects. We discussed that some of the most bothersome acute effects may be mucositis, dysgeusia, salivary changes, skin irritation, hair loss, dehydration, weight loss and fatigue. We talked about late effects which include but are not necessarily limited to dysphagia, hypothyroidism, nerve injury, vascular injury, spinal cord injury, xerostomia, trismus, neck edema, and potential injury to any of the tissues in the head and neck region. No guarantees of treatment were given. A consent form was signed and placed in the patient's medical record. The patient is enthusiastic about proceeding with treatment. I look forward to participating in the patient's care.    Simulation (treatment planning) will take place today.  We also discussed that the treatment of head and neck cancer is a multidisciplinary process to maximize treatment outcomes and quality of life. For this reason the following referrals have been or will be made:   Medical oncology to discuss chemotherapy    Dentistry for dental evaluation, possible extractions in the radiation fields, and /or advice on reducing risk of cavities, osteoradionecrosis, or other oral issues. (Cleared by Dr Benson Norway)   Nutritionist for nutrition support during and after treatment. PEG placement is optional; patient is interested in using a "wait and see" approach and understands that tube placement will be needed if he has severe weight loss.   Speech language pathology for swallowing and/or speech therapy.   Social work for social support.    Physical therapy due to risk of lymphedema in  neck and deconditioning.    On date of service, in total, I spent 30 minutes on this encounter. Patient was seen in person.   __________________________________________   Eppie Gibson, MD  This document serves as a record  of services personally performed by Eppie Gibson, MD. It was created on her behalf by Roney Mans, a trained medical scribe. The creation of this record is based on the scribe's personal observations and the provider's statements to them. This document has been checked and approved by the attending provider.

## 2021-09-24 ENCOUNTER — Ambulatory Visit
Admission: RE | Admit: 2021-09-24 | Discharge: 2021-09-24 | Disposition: A | Discharge status: Home / Self Care | Payer: BC Managed Care – PPO | Source: Ambulatory Visit | Attending: Radiation Oncology | Admitting: Radiation Oncology

## 2021-09-24 ENCOUNTER — Encounter: Payer: Self-pay | Admitting: Radiation Oncology

## 2021-09-24 ENCOUNTER — Ambulatory Visit: Payer: BC Managed Care – PPO

## 2021-09-24 ENCOUNTER — Other Ambulatory Visit: Payer: Self-pay

## 2021-09-24 ENCOUNTER — Ambulatory Visit
Admission: RE | Admit: 2021-09-24 | Discharge: 2021-09-24 | Disposition: A | Discharge status: Home / Self Care | Payer: BC Managed Care – PPO | Source: Ambulatory Visit | Attending: Oncology | Admitting: Oncology

## 2021-09-24 VITALS — BP 100/66 | HR 91 | Temp 98.1°F | Resp 20 | Ht 73.0 in | Wt 273.0 lb

## 2021-09-24 DIAGNOSIS — F17210 Nicotine dependence, cigarettes, uncomplicated: Secondary | ICD-10-CM | POA: Diagnosis not present

## 2021-09-24 DIAGNOSIS — C111 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: Secondary | ICD-10-CM | POA: Insufficient documentation

## 2021-09-24 DIAGNOSIS — Z87891 Personal history of nicotine dependence: Secondary | ICD-10-CM | POA: Diagnosis not present

## 2021-09-24 DIAGNOSIS — Z5111 Encounter for antineoplastic chemotherapy: Secondary | ICD-10-CM | POA: Diagnosis not present

## 2021-09-24 DIAGNOSIS — K449 Diaphragmatic hernia without obstruction or gangrene: Secondary | ICD-10-CM | POA: Diagnosis not present

## 2021-09-24 DIAGNOSIS — I700 Atherosclerosis of aorta: Secondary | ICD-10-CM | POA: Diagnosis not present

## 2021-09-24 DIAGNOSIS — K219 Gastro-esophageal reflux disease without esophagitis: Secondary | ICD-10-CM | POA: Diagnosis not present

## 2021-09-24 DIAGNOSIS — M1990 Unspecified osteoarthritis, unspecified site: Secondary | ICD-10-CM | POA: Diagnosis not present

## 2021-09-24 DIAGNOSIS — E039 Hypothyroidism, unspecified: Secondary | ICD-10-CM | POA: Diagnosis not present

## 2021-09-24 DIAGNOSIS — D693 Immune thrombocytopenic purpura: Secondary | ICD-10-CM | POA: Diagnosis not present

## 2021-09-24 DIAGNOSIS — J432 Centrilobular emphysema: Secondary | ICD-10-CM | POA: Insufficient documentation

## 2021-09-24 DIAGNOSIS — E119 Type 2 diabetes mellitus without complications: Secondary | ICD-10-CM | POA: Diagnosis not present

## 2021-09-24 DIAGNOSIS — Z510 Encounter for antineoplastic radiation therapy: Secondary | ICD-10-CM | POA: Insufficient documentation

## 2021-09-24 DIAGNOSIS — Z79899 Other long term (current) drug therapy: Secondary | ICD-10-CM | POA: Diagnosis not present

## 2021-09-24 DIAGNOSIS — I10 Essential (primary) hypertension: Secondary | ICD-10-CM | POA: Insufficient documentation

## 2021-09-24 DIAGNOSIS — K4090 Unilateral inguinal hernia, without obstruction or gangrene, not specified as recurrent: Secondary | ICD-10-CM | POA: Diagnosis not present

## 2021-09-24 DIAGNOSIS — R519 Headache, unspecified: Secondary | ICD-10-CM | POA: Diagnosis not present

## 2021-09-24 DIAGNOSIS — R221 Localized swelling, mass and lump, neck: Secondary | ICD-10-CM | POA: Diagnosis not present

## 2021-09-24 DIAGNOSIS — C119 Malignant neoplasm of nasopharynx, unspecified: Secondary | ICD-10-CM

## 2021-09-24 DIAGNOSIS — J449 Chronic obstructive pulmonary disease, unspecified: Secondary | ICD-10-CM | POA: Insufficient documentation

## 2021-09-24 DIAGNOSIS — Z7984 Long term (current) use of oral hypoglycemic drugs: Secondary | ICD-10-CM | POA: Diagnosis not present

## 2021-09-24 DIAGNOSIS — K5730 Diverticulosis of large intestine without perforation or abscess without bleeding: Secondary | ICD-10-CM | POA: Diagnosis not present

## 2021-09-24 DIAGNOSIS — M069 Rheumatoid arthritis, unspecified: Secondary | ICD-10-CM | POA: Diagnosis not present

## 2021-09-24 DIAGNOSIS — C118 Malignant neoplasm of overlapping sites of nasopharynx: Secondary | ICD-10-CM | POA: Diagnosis not present

## 2021-09-24 LAB — BUN & CREATININE (CHCC)
BUN: 20 mg/dL (ref 8–23)
Creatinine: 0.95 mg/dL (ref 0.61–1.24)
GFR, Estimated: >60 mL/min (ref >60)

## 2021-09-24 LAB — TSH: TSH: 2.866 u[IU]/mL (ref 0.320–4.118)

## 2021-09-24 MED ORDER — SODIUM CHLORIDE 0.9% FLUSH
10.0000 mL | Freq: Once | INTRAVENOUS | Status: AC
  Administered 2021-09-24: 10 mL via INTRAVENOUS

## 2021-09-24 MED ORDER — HEPARIN SOD (PORK) LOCK FLUSH 100 UNIT/ML IV SOLN
500.0000 [IU] | Freq: Once | INTRAVENOUS | Status: AC
  Administered 2021-09-24: 500 [IU] via INTRAVENOUS

## 2021-09-24 NOTE — Progress Notes (Signed)
Has armband been applied?  Yes.    Does patient have an allergy to IV contrast dye?: No.   Has patient ever received premedication for IV contrast dye?: No.   Does patient take metformin?: Yes.    Date of lab work: September 24, 2021 BUN: 21 CR: 0.95 eGFR: >60  IV site: subclavian right, condition patent and no redness  Has IV site been added to flowsheet?  Yes.    BP 100/66 (BP Location: Left Arm, Patient Position: Sitting, Cuff Size: Large)    Pulse 91    Temp 98.1 F (36.7 C)    Resp 20    Ht _0  (1.854 m)    Wt 273 lb (123.8 kg)    SpO2 94%    BMI 36.02 kg/m

## 2021-09-25 ENCOUNTER — Other Ambulatory Visit: Payer: Self-pay

## 2021-09-25 ENCOUNTER — Encounter: Payer: Self-pay | Admitting: Radiation Oncology

## 2021-09-25 DIAGNOSIS — C111 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: Secondary | ICD-10-CM

## 2021-09-26 ENCOUNTER — Encounter: Payer: Self-pay | Admitting: General Practice

## 2021-09-26 NOTE — Progress Notes (Signed)
[place] Work  Initial Assessment   Dustin [name] is a 61 y.o. year old male contacted by phone. Clinical Social Work was referred by radiation oncologist for assessment of psychosocial needs.   SDOH (Social Determinants of Health) assessments performed: Yes   Distress Screen completed: No No flowsheet data found.    Family/Social Information:  Housing Arrangement: patient lives with wife and adopted granddaughter .  Wife on disability, granddaughter needs much support.   Family members/support persons in your life? Family Transportation concerns: no, other than cost of gas  Employment: Disabled. 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  Drives long distances out of state.  Cancer started out after he began to experience headaches which became increasingly difficult to bear.  Then felt knot on neck, which led to cancer diagnosis (neck and behind sinuses).   Financial concerns:  has two disability policies both of which are helping him be financially stable at this time.  Type of concern: None Food access concerns: no Religious or spiritual practice: no Medication Concerns: no  Services Currently in place:  none  Coping/ Adjustment to diagnosis: Patient understands treatment plan and what happens next? yes, diagnosed w nasopharyngeal cancer, currently in chemotherapy.  Radiation is recommended, but "if the chemo is doing so good, why do I have to do the radiation?"  Radiation MD confirmed the need for radiation. He is determined to "make it through" radiation treatment but has also been told treatment may be painful and challenging.  He is aware of the possibility of needing a feeding tube - hopes his wife can be trained on its use as "I may not feel like using it."  In the midst of his own  diagnosis and treatment, he is also struggling w the mental health needs of his 97 year old granddaughter who lives w them.  He is trying to find resources for her, has not been able to get her linked successfully w mental health support.  Suggested resource in [facility] as a possibility.   Concerns about diagnosis and/or treatment: Pain or discomfort during procedures and How I will care for other members of my family Patient reported stressors: Children and Physical issues Hopes and priorities: find help for his granddaughter who is struggling with multiple issues, he has raised this child from birth; he hopes to be able to tolerate treatment and heal enough to return to work. Patient enjoys time with family/ friends Current coping skills/ strengths: Capable of independent living , Communication skills , and General fund of knowledge     SUMMARY: Current SDOH Barriers:  Family difficulties w granddaughter/stress  Interventions: Discussed common feeling and emotions when being diagnosed with cancer, and the importance of support during treatment Informed patient of the support team roles and support services at [facility] Provided [place] contact information and encouraged patient to call with any questions or concerns   Follow Up Plan: Patient will contact CSW with any support or resource needs Patient verbalizes understanding of plan: Yes    Beverely Pace , [place], [place] Worker Phone:  [phone]

## 2021-09-27 NOTE — Progress Notes (Signed)
Oncology Nurse Navigator Documentation   I called Mr. Vallandingham today to reintroduce myself as his head and neck navigator before he starts radiation and chemotherapy next week. I explained the need to see Swallowing and Physical Therapy to help with his expected side effects from his upcoming treatment. He agreed to come to the head and neck clinic at [facility] on 10/04/21 at 9:00. I answered multiple questions regarding his upcoming treatment including side effects and scheduling concerns. I provided him with my direct contact information and he's aware that I will visit with him on 10/03/21 before or after his first radiation treatment. He knows to call me if he has any questions before that date.   Harlow Asa RN, BSN, OCN Head & Neck Oncology Nurse [place] at [facility] Phone # [phone]  Fax # [phone]

## 2021-09-30 ENCOUNTER — Other Ambulatory Visit: Payer: Self-pay | Admitting: Oncology

## 2021-10-02 DIAGNOSIS — D693 Immune thrombocytopenic purpura: Secondary | ICD-10-CM | POA: Diagnosis not present

## 2021-10-02 DIAGNOSIS — F17210 Nicotine dependence, cigarettes, uncomplicated: Secondary | ICD-10-CM | POA: Diagnosis not present

## 2021-10-02 DIAGNOSIS — M069 Rheumatoid arthritis, unspecified: Secondary | ICD-10-CM | POA: Diagnosis not present

## 2021-10-02 DIAGNOSIS — E039 Hypothyroidism, unspecified: Secondary | ICD-10-CM | POA: Diagnosis not present

## 2021-10-02 DIAGNOSIS — E119 Type 2 diabetes mellitus without complications: Secondary | ICD-10-CM | POA: Diagnosis not present

## 2021-10-02 DIAGNOSIS — Z79899 Other long term (current) drug therapy: Secondary | ICD-10-CM | POA: Diagnosis not present

## 2021-10-02 DIAGNOSIS — C111 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: Secondary | ICD-10-CM | POA: Diagnosis not present

## 2021-10-02 DIAGNOSIS — C118 Malignant neoplasm of overlapping sites of nasopharynx: Secondary | ICD-10-CM | POA: Diagnosis not present

## 2021-10-02 DIAGNOSIS — Z5111 Encounter for antineoplastic chemotherapy: Secondary | ICD-10-CM | POA: Diagnosis not present

## 2021-10-02 DIAGNOSIS — J449 Chronic obstructive pulmonary disease, unspecified: Secondary | ICD-10-CM | POA: Diagnosis not present

## 2021-10-02 DIAGNOSIS — Z510 Encounter for antineoplastic radiation therapy: Secondary | ICD-10-CM | POA: Diagnosis not present

## 2021-10-03 ENCOUNTER — Other Ambulatory Visit: Payer: Self-pay

## 2021-10-03 ENCOUNTER — Ambulatory Visit
Admission: RE | Admit: 2021-10-03 | Discharge: 2021-10-03 | Disposition: A | Discharge status: Home / Self Care | Payer: BC Managed Care – PPO | Source: Ambulatory Visit | Attending: Radiation Oncology | Admitting: Radiation Oncology

## 2021-10-03 DIAGNOSIS — M069 Rheumatoid arthritis, unspecified: Secondary | ICD-10-CM | POA: Diagnosis not present

## 2021-10-03 DIAGNOSIS — F17210 Nicotine dependence, cigarettes, uncomplicated: Secondary | ICD-10-CM | POA: Diagnosis not present

## 2021-10-03 DIAGNOSIS — E039 Hypothyroidism, unspecified: Secondary | ICD-10-CM | POA: Diagnosis not present

## 2021-10-03 DIAGNOSIS — Z510 Encounter for antineoplastic radiation therapy: Secondary | ICD-10-CM | POA: Diagnosis not present

## 2021-10-03 DIAGNOSIS — E119 Type 2 diabetes mellitus without complications: Secondary | ICD-10-CM | POA: Diagnosis not present

## 2021-10-03 DIAGNOSIS — D693 Immune thrombocytopenic purpura: Secondary | ICD-10-CM | POA: Diagnosis not present

## 2021-10-03 DIAGNOSIS — C111 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: Secondary | ICD-10-CM | POA: Diagnosis not present

## 2021-10-03 DIAGNOSIS — C118 Malignant neoplasm of overlapping sites of nasopharynx: Secondary | ICD-10-CM | POA: Diagnosis not present

## 2021-10-03 DIAGNOSIS — Z5111 Encounter for antineoplastic chemotherapy: Secondary | ICD-10-CM | POA: Diagnosis not present

## 2021-10-03 DIAGNOSIS — J449 Chronic obstructive pulmonary disease, unspecified: Secondary | ICD-10-CM | POA: Diagnosis not present

## 2021-10-03 DIAGNOSIS — Z79899 Other long term (current) drug therapy: Secondary | ICD-10-CM | POA: Diagnosis not present

## 2021-10-03 NOTE — Progress Notes (Signed)
Oncology Nurse Navigator Documentation   To provide support, encouragement and care continuity, met with [name] and his wife for his initial RT.   I reviewed the 2-step treatment process, answered questions.  [name] completed treatment without difficulty, denied questions/concerns. I reviewed the registration/arrival procedure for subsequent treatments. He understands that he will come here to the [place] tomorrow to meet with the swallowing therapist and physical therapist before his radiation and chemotherapy appointments.  I encouraged them to call me with questions/concerns as tmts proceed.   Harlow Asa RN, BSN, OCN Head & Neck Oncology Nurse [place] at [facility] Phone # [phone]  Fax # [phone]

## 2021-10-04 ENCOUNTER — Ambulatory Visit
Admission: RE | Admit: 2021-10-04 | Discharge: 2021-10-04 | Disposition: A | Discharge status: Home / Self Care | Payer: BC Managed Care – PPO | Source: Ambulatory Visit | Attending: Radiation Oncology | Admitting: Radiation Oncology

## 2021-10-04 ENCOUNTER — Encounter: Payer: Self-pay | Admitting: Licensed Clinical Social Worker

## 2021-10-04 ENCOUNTER — Inpatient Hospital Stay: Payer: BC Managed Care – PPO | Admitting: Nurse Practitioner

## 2021-10-04 ENCOUNTER — Encounter: Payer: Self-pay | Admitting: Nurse Practitioner

## 2021-10-04 ENCOUNTER — Inpatient Hospital Stay: Payer: BC Managed Care – PPO

## 2021-10-04 ENCOUNTER — Ambulatory Visit: Payer: BC Managed Care – PPO | Attending: Radiation Oncology | Admitting: Physical Therapy

## 2021-10-04 ENCOUNTER — Encounter: Payer: Self-pay | Admitting: *Deleted

## 2021-10-04 ENCOUNTER — Ambulatory Visit: Payer: BC Managed Care – PPO

## 2021-10-04 ENCOUNTER — Encounter: Payer: Self-pay | Admitting: Physical Therapy

## 2021-10-04 VITALS — BP 118/69 | HR 93 | Temp 98.7°F | Resp 20 | Ht 73.0 in | Wt 272.0 lb

## 2021-10-04 VITALS — BP 111/59 | HR 87 | Resp 18

## 2021-10-04 DIAGNOSIS — C111 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: Secondary | ICD-10-CM | POA: Diagnosis not present

## 2021-10-04 DIAGNOSIS — R1310 Dysphagia, unspecified: Secondary | ICD-10-CM | POA: Insufficient documentation

## 2021-10-04 DIAGNOSIS — R293 Abnormal posture: Secondary | ICD-10-CM | POA: Diagnosis not present

## 2021-10-04 DIAGNOSIS — C119 Malignant neoplasm of nasopharynx, unspecified: Secondary | ICD-10-CM

## 2021-10-04 DIAGNOSIS — C118 Malignant neoplasm of overlapping sites of nasopharynx: Secondary | ICD-10-CM | POA: Diagnosis not present

## 2021-10-04 DIAGNOSIS — E119 Type 2 diabetes mellitus without complications: Secondary | ICD-10-CM | POA: Diagnosis not present

## 2021-10-04 DIAGNOSIS — Z510 Encounter for antineoplastic radiation therapy: Secondary | ICD-10-CM | POA: Diagnosis not present

## 2021-10-04 DIAGNOSIS — D693 Immune thrombocytopenic purpura: Secondary | ICD-10-CM | POA: Diagnosis not present

## 2021-10-04 DIAGNOSIS — M069 Rheumatoid arthritis, unspecified: Secondary | ICD-10-CM | POA: Diagnosis not present

## 2021-10-04 DIAGNOSIS — Z5111 Encounter for antineoplastic chemotherapy: Secondary | ICD-10-CM | POA: Diagnosis not present

## 2021-10-04 DIAGNOSIS — F17210 Nicotine dependence, cigarettes, uncomplicated: Secondary | ICD-10-CM | POA: Diagnosis not present

## 2021-10-04 DIAGNOSIS — Z79899 Other long term (current) drug therapy: Secondary | ICD-10-CM | POA: Diagnosis not present

## 2021-10-04 DIAGNOSIS — J449 Chronic obstructive pulmonary disease, unspecified: Secondary | ICD-10-CM | POA: Diagnosis not present

## 2021-10-04 DIAGNOSIS — E039 Hypothyroidism, unspecified: Secondary | ICD-10-CM | POA: Diagnosis not present

## 2021-10-04 LAB — CBC WITH DIFFERENTIAL (CANCER CENTER ONLY)
Abs Immature Granulocytes: 0.02 10*3/uL (ref 0.00–0.07)
Basophils Absolute: 0.1 10*3/uL (ref 0.0–0.1)
Basophils Relative: 1 %
Eosinophils Absolute: 0.2 10*3/uL (ref 0.0–0.5)
Eosinophils Relative: 2 %
HCT: 38.8 % — ABNORMAL LOW (ref 39.0–52.0)
Hemoglobin: 12.5 g/dL — ABNORMAL LOW (ref 13.0–17.0)
Immature Granulocytes: 0 %
Lymphocytes Relative: 22 %
Lymphs Abs: 1.9 10*3/uL (ref 0.7–4.0)
MCH: 32.1 pg (ref 26.0–34.0)
MCHC: 32.2 g/dL (ref 30.0–36.0)
MCV: 99.7 fL (ref 80.0–100.0)
Monocytes Absolute: 0.7 10*3/uL (ref 0.1–1.0)
Monocytes Relative: 8 %
Neutro Abs: 5.8 10*3/uL (ref 1.7–7.7)
Neutrophils Relative %: 67 %
Platelet Count: 201 10*3/uL (ref 150–400)
RBC: 3.89 MIL/uL — ABNORMAL LOW (ref 4.22–5.81)
RDW: 13.2 % (ref 11.5–15.5)
WBC Count: 8.6 10*3/uL (ref 4.0–10.5)
nRBC: 0 % (ref 0.0–0.2)

## 2021-10-04 LAB — CMP (CANCER CENTER ONLY)
ALT: 17 U/L (ref 0–44)
AST: 15 U/L (ref 15–41)
Albumin: 4.2 g/dL (ref 3.5–5.0)
Alkaline Phosphatase: 47 U/L (ref 38–126)
Anion gap: 7 (ref 5–15)
BUN: 20 mg/dL (ref 8–23)
CO2: 29 mmol/L (ref 22–32)
Calcium: 9.4 mg/dL (ref 8.9–10.3)
Chloride: 103 mmol/L (ref 98–111)
Creatinine: 0.84 mg/dL (ref 0.61–1.24)
GFR, Estimated: >60 mL/min (ref >60)
Glucose, Bld: 183 mg/dL — ABNORMAL HIGH (ref 70–99)
Potassium: 4.4 mmol/L (ref 3.5–5.1)
Sodium: 139 mmol/L (ref 135–145)
Total Bilirubin: 0.8 mg/dL (ref 0.3–1.2)
Total Protein: 7.2 g/dL (ref 6.5–8.1)

## 2021-10-04 MED ORDER — PALONOSETRON HCL INJECTION 0.25 MG/5ML
0.2500 mg | Freq: Once | INTRAVENOUS | Status: AC
  Administered 2021-10-04: 0.25 mg via INTRAVENOUS
  Filled 2021-10-04: qty 5

## 2021-10-04 MED ORDER — SODIUM CHLORIDE 0.9 % IV SOLN: Freq: Once | INTRAVENOUS | Status: AC

## 2021-10-04 MED ORDER — SODIUM CHLORIDE 0.9 % IV SOLN
10.0000 mg | Freq: Once | INTRAVENOUS | Status: AC
  Administered 2021-10-04: 10 mg via INTRAVENOUS
  Filled 2021-10-04: qty 1

## 2021-10-04 MED ORDER — HEPARIN SOD (PORK) LOCK FLUSH 100 UNIT/ML IV SOLN
500.0000 [IU] | Freq: Once | INTRAVENOUS | Status: AC | PRN
  Administered 2021-10-04: 500 [IU]

## 2021-10-04 MED ORDER — SODIUM CHLORIDE 0.9% FLUSH
10.0000 mL | INTRAVENOUS | Status: DC | PRN
  Administered 2021-10-04: 10 mL

## 2021-10-04 MED ORDER — SODIUM CHLORIDE 0.9 % IV SOLN
250.0000 mg | Freq: Once | INTRAVENOUS | Status: AC
  Administered 2021-10-04: 250 mg via INTRAVENOUS
  Filled 2021-10-04: qty 25

## 2021-10-04 NOTE — Progress Notes (Signed)
Patient presents for treatment. RN assessment completed along with the following:  Labs/vitals reviewed - Yes, and within treatment parameters.   Weight within 10% of previous measurement - Yes Informed consent completed and reflects current therapy/intent - Yes, on date 07/13/21             Provider progress note reviewed - Patient not seen by provider today. Most recent note dated 09/18/21 reviewed. Treatment/Antibody/Supportive plan reviewed - Yes, and Carboplatin has been dose reduced.  S&H and other orders reviewed - Yes, and there are no additional orders identified. Previous treatment date reviewed - Yes, and the appropriate amount of time has elapsed between treatments. Clinic Hand Off Received from - Merceda Elks, RN  Patient to proceed with treatment.

## 2021-10-04 NOTE — Therapy (Signed)
[facility] [address] [place], [place], [address] Phone: [phone]   Fax:  [phone]  Speech Language Pathology Evaluation  Patient Details  Name: Dustin [name] MRN: [id number] Date of Birth: [birth year] Referring Provider (SLP): Eppie Gibson, MD   Encounter Date: 10/04/2021   End of Session - 10/04/21 1031     Visit Number 1    Number of Visits 4    Date for SLP Re-Evaluation 01/02/22    SLP Start Time 0910    SLP Stop Time  1000    SLP Time Calculation (min) 50 min             Past Medical History:  Diagnosis Date   Anxiety    COPD (chronic obstructive pulmonary disease) ([place])    Depression    Essential hypertension    GERD (gastroesophageal reflux disease)    H/O hiatal hernia    History of cardiac catheterization    Normal coronaries May 2017   History of ITP    Childhood   Hypothyroidism    Osteoarthritis    Type 2 diabetes mellitus ([place])    no meds    Past Surgical History:  Procedure Laterality Date   ANTERIOR CERVICAL DECOMP/DISCECTOMY FUSION  01/21/2012   Procedure: ANTERIOR CERVICAL DECOMPRESSION/DISCECTOMY FUSION 2 LEVELS;  Surgeon: Erline Levine, MD;  Location: [facility];  Service: Neurosurgery;  Laterality: N/A;  Cervical Six-Seven, Cervical Seven-Thoracic One, Anterior Cervical Decompression with Fusion Interbody Prothesis Plating and Bonegraft possible posterior Cervical Seven-Thoracic One Foraminotomy   CARDIAC CATHETERIZATION  [phone]   normal coronary arteries   CARDIAC CATHETERIZATION N/A 01/22/2016   Procedure: Left Heart Cath and Coronary Angiography;  Surgeon: Burnell Blanks, MD;  Location: [facility];  Service: Cardiovascular;  Laterality: N/A;   CARPAL TUNNEL RELEASE Bilateral    CERVICAL DISC SURGERY  04   CHOLECYSTECTOMY N/A 10/26/2014   Procedure: LAPAROSCOPIC CHOLECYSTECTOMY;  Surgeon: Jamesetta So, MD;  Location: [facility];  Service: General;  Laterality:  N/A;   ESOPHAGOGASTRODUODENOSCOPY N/A 05/03/2016   Procedure: ESOPHAGOGASTRODUODENOSCOPY (EGD);  Surgeon: Rogene Houston, MD;  Location: [facility];  Service: Endoscopy;  Laterality: N/A;  10:30   HERNIA REPAIR Right 60's   IR IMAGING GUIDED PORT INSERTION  06/27/2021   Left elbow bursa removed     NASOPHARYNGOSCOPY N/A 06/04/2021   Procedure: NASAL ENDOSCOPY WITH BIOPSY OF NASOPHARYNX; FROZEN SECTION;  Surgeon: Izora Gala, MD;  Location: [facility];  Service: ENT;  Laterality: N/A;   TONSILLECTOMY  74    There were no vitals filed for this visit.   Subjective Assessment - 10/04/21 0908     Subjective Pt reports rare difficulty with pharyngeal clearance with food, after second ACDF surgery in 2013. "I just hock it up and chew it up more or take a drink with it."    Patient is accompained by: Family member   wife debbie   Currently in Pain? No/denies                SLP Evaluation [facility] - 10/04/21 9326       SLP Visit Information   SLP Received On 10/04/21    Referring Provider (SLP) Eppie Gibson, MD    Onset Date fall 2022    Medical Diagnosis Nasopharngeal cancer      Subjective   Patient/Family Stated Goal keep swallowing WNL      General Information   HPI Pt with SCC of  the Nasopharynx, stage III (T3, N2, M0). He presented with a lump in his left neck which was first noticed this past July/August to his PCP. 9/09 /22 CT neck revealed an aggressive nasopharyngeal tumor with bilateral lymphadenopathy and skull base erosion. He saw Dr. Constance Holster 06-01-21 and had performed a fiberoptic nasal endoscopy which revealed nasopharyngeal mass. Enlarged lymph nodes in bil neck were suspicious for nasopharyngeal carcinoma. Patient was recommended to undergo biopsy on 06-04-21 which revealed SCC, EBV negative. PET 06-19-21 revealed substantially reduced size of the posterior nasopharyngeal mass. and bil mod enlarged single level IIa lymph nodes. No findings of metastatic disease to the chest,  abdomen, or pelvis. Trigeminal MRI 06-19-21 revealed extensive tumor invasion of the skull base, no trigeminal or other distinct cranial nerve involvement was otherwise seen. Consult with Dr. Benay Spice on 06/12/21 and Dr. Isidore Moos on 06/20/21 and patient opted to proceed with induction chemo then proceed with chemo/radiation after. He received 3 cycles of chemotherapy completed on 08/17/21. Restaging PET 09/14/21 demonstrated a decrease in size and degree of FDG uptake associated with the FDG avid posterior nasopharyngeal lesion. Residual FDG uptake was appreciated along the midline of the posterior nasopharynx, which demonstrated an SUV max of 6.08, and noted as concerning for residual disease. Persistent but improved asymmetric increased uptake was also noted within the right tonsillar region; also noted and concerning for residual disease. PET also confirmed resolution of previous FDG avid cervical lymph nodes. No signs of FDG avid metastatic disease to the chest, abdomen or pelvis were otherwise appreciated. Treatment plan is to receive 35 fractions of radiation to nasopharynx and bil neck with weekly carboplatin. Started treatment on 10/03/21 and will complete 11/20/21.      Prior Functional Status   Cognitive/Linguistic Baseline Within functional limits      Cognition   Overall Cognitive Status Within Functional Limits for tasks assessed      Auditory Comprehension   Overall Auditory Comprehension Appears within functional limits for tasks assessed      Oral Motor/Sensory Function   Overall Oral Motor/Sensory Function Appears within functional limits for tasks assessed      Motor Speech   Overall Motor Speech Appears within functional limits for tasks assessed    Resonance Within functional limits   pt's resonance sounded WNL in conversation, and with plosive-laden sentences   Intelligibility Intelligible             Pt currently tolerates regular diet with thin liquids, with some rare reported  mild pharyngeal difficulty with drier or more dense food items following what was described to SLP as ACDF surgery in 2013 (pt's reported second ACDF). See pt's "S" statement.  POs: Pt ate ham sandwich with water without overt s/s oral or pharyngeal deficits, given incr'd mastication time due to reduced maxillary dentition ("I have top dentures but I just don't use them"). Thyroid elevation appeared adequate, and swallows appeared timely. Pt's swallow deemed WFL at this time.   Because data states the risk for dysphagia during and after radiation treatment is high due to undergoing radiation tx, SLP taught pt about the possibility of reduced/limited ability for PO intake during rad tx. SLP encouraged pt to continue swallowing POs as far into rad tx as possible, even ingesting POs and/or completing HEP shortly after administration of pain meds. Among other modifications for days when pt cannot functionally swallow, SLP talked about performing only non-swallowing tasks on the handout/HEP, and if necessary to cycle through the swallowing portion so the program  of exercises can be completed instead of fatiguing on one of the swallowing exercises and not being able to perform the other swallowing exercises. Pt was then told to add all reps of swallowing tasks back in when it becomes possible to do so.  SLP educated pt re: changes to swallowing musculature after rad tx, and why adherence to dysphagia HEP provided today and PO consumption was necessary to inhibit muscular disuse atrophy and to reduce muscle fibrosis following rad tx. Pt demonstrated understanding of these things to SLP.    SLP then developed a HEP for pt and pt was instructed how to perform exercises involving lingual, vocal, and pharyngeal strengthening. SLP performed each exercise and pt return demonstrated each exercise. SLP ensured pt performance was correct prior to moving on to next exercise. Pt was instructed to complete this program 2 times  a day, until 6 months after his last rad tx, then x2-3 a week after that.                 SLP Education - 10/04/21 1029     Education Details HEP procedure, late effects head/neck radiation on swallowing    Person(s) Educated Patient;Spouse    Methods Demonstration;Verbal cues;Handout;Explanation    Comprehension Verbalized understanding;Returned demonstration;Verbal cues required;Need further instruction              SLP Short Term Goals - 10/04/21 1035       SLP SHORT TERM GOAL #1   Title pt will complete HEP with rare min A    Time 1    Period --   visits, for all STGs   Status New      SLP SHORT TERM GOAL #2   Title pt will tell SLP why pt is completing HEP with modified independence in two sessions    Time 2    Status New      SLP SHORT TERM GOAL #3   Title pt will describe 3 overt s/s aspiration PNA with modified independence    Time 2    Status New      SLP SHORT TERM GOAL #4   Title pt will tell SLP how a food journal could hasten return to a more normalized diet    Time 3    Status New              SLP Long Term Goals - 10/04/21 1037       SLP LONG TERM GOAL #1   Title pt will complete HEP with modified independence over two visits    Time 4    Period --   visits, for all LTGs   Status New      SLP LONG TERM GOAL #2   Title pt will describe how to modify HEP over time, and the timeline associated with reduction in HEP frequency with modified independence over two sessions    Time 6    Status New              Plan - 10/04/21 1033     Clinical Impression Statement At this time pt swallowing is deemed WNL/WFL with regular diet/thin liquids. SLP designed an individualized HEP for dysphagia and pt completed each exercise on their own with total cues, faded to modified independent. There are no overt s/s aspiration PNA reported by pt at this time, nor were any observed. Data indicate that pt's swallow ability will likely decrease  over the course of radiation therapy and could very well  decline over time following conclusion of their radiation therapy due to muscle disuse atrophy and/or muscle fibrosis. Pt will cont to need to be seen by SLP in order to assess safety of PO intake, assess the need for recommending any objective swallow assessment, and ensuring pt correctly completes the individualized HEP.    Speech Therapy Frequency --   once approx every 4 weeks   Duration --   4 total visits this reporting period; overall plan of care will be no more than 7 total visits   Treatment/Interventions Aspiration precaution training;Pharyngeal strengthening exercises;Diet toleration management by SLP;Compensatory techniques;Internal/external aids;SLP instruction and feedback;Patient/family education;Trials of upgraded texture/liquids;Environmental controls    Potential to Achieve Goals Good    SLP Home Exercise Plan provided today    Consulted and Agree with Plan of Care Patient             Patient will benefit from skilled therapeutic intervention in order to improve the following deficits and impairments:   Dysphagia, unspecified type    Problem List Patient Active Problem List   Diagnosis Date Noted   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 ([place]) 06/22/2021   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[place]) 06/12/2021   Chest pain of uncertain etiology    Former smoker 04/18/2017   Primary insomnia 04/14/2017   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   History of positive PPD treated with INH  04/14/2017   Abscess of left olecranon bursa 11/07/2016   High risk medication use 11/07/2016   Cervical disc disease 11/07/2016   Primary osteoarthritis of both hands 11/07/2016   Primary osteoarthritis of both knees 11/07/2016   Primary osteoarthritis of both shoulders 11/07/2016   Precordial pain 01/22/2016   Pain in the chest    Hyperlipidemia 01/20/2016   Depression with anxiety 01/20/2016   GERD (gastroesophageal  reflux disease) 01/20/2016   Type 2 diabetes mellitus ([place]) 01/20/2016   Chest pain at rest 09/22/2014   Tobacco use 09/22/2014   Morbid obesity ([place]) 09/22/2014   Rheumatoid arthritis ([place]) 09/22/2014   Family history of heart disease 09/22/2014   Chest pain 09/22/2014    [place], [place] 10/04/2021, 10:38 AM  [facility] [address], [place], [place], [address] Phone: [phone]   Fax:  [phone]  Name: TWAN HARKIN MRN: [id number] Date of Birth: 02-Sep-1961

## 2021-10-04 NOTE — Progress Notes (Signed)
Dustin [name] Progress Note  Holiday representative met with patient and wife in H&N clinic to review support services available. Pt had spoken with [name] Dustin [name] previously. Pt reports still being able to manage financially (has short-term disability until March then long-term disability, [employer] policy until September). He and wife also quit smoking 3 months ago.  One difficult piece has been that people don't see that he is sick, so they don't always understand.  [name] informed patient of the support team and support services at [facility].  [name] provided contact information and encouraged patient to call with any questions or concerns.    [name] , LCSW

## 2021-10-04 NOTE — Patient Instructions (Signed)

## 2021-10-04 NOTE — Progress Notes (Signed)
Oncology Nurse Navigator Documentation   I met with Mr. Sirico and his wife before his scheduled appointments with SLP and PT today. I answered their questions to the best of my ability and they know to call me if they need anymore assistance related to his treatment.   Harlow Asa RN, BSN, OCN Head & Neck Oncology Nurse [place] at [facility] Phone # [phone]  Fax # [phone]

## 2021-10-04 NOTE — Progress Notes (Signed)
Patient seen by Lisa Thomas NP today  Vitals are within treatment parameters.  Labs reviewed by Lisa Thomas NP and are within treatment parameters.  Per physician team, patient is ready for treatment and there are NO modifications to the treatment plan.     

## 2021-10-04 NOTE — Patient Instructions (Signed)
SWALLOWING EXERCISES Do these until 6 months after your last day of radiation, then 2-3 times per week afterwards  Effortful Swallows - Press your tongue against the roof of your mouth for 3 seconds, then squeeze the muscles in your neck while you swallow your saliva or a sip of water - Repeat 10-15 times, 2-3 times a day, and use whenever you eat or drink  [place] Swallow - swallow with your tongue sticking out - Stick tongue out past your lips and gently bite tongue with your teeth - Swallow, while holding your tongue with your teeth - Repeat 10-15 times, 2-3 times a day *use a wet spoon if your mouth gets dry*  Mendelsohn Maneuver - "half swallow" exercise - Start to swallow, and keep your Adam's apple up by squeezing hard with the muscles of the throat - Hold the squeeze for 5-7 seconds and then relax - Repeat 10-15 times, 2-3 times a day *use a wet spoon if your mouth gets dry*       4.   "Super Swallow"  - Take a breath and hold it  - Bear down (like pushing your bowels)  - Swallow then IMMEDIATELY cough  - Repeat 10 times, 2-3 times a day  

## 2021-10-04 NOTE — Therapy (Signed)
[facility] @ [address] [place], [place], [address] Phone: [phone]   Fax:  [phone]  Physical Therapy Evaluation  Patient Details  Name: Dustin [name] MRN: [id number] Date of Birth: 30-Oct-1960 Referring Provider (PT): Isidore Moos   Encounter Date: 10/04/2021   PT End of Session - 10/04/21 0936     Visit Number 1    Number of Visits 2    Date for PT Re-Evaluation 12/13/21    PT Start Time 1006    PT Stop Time 4132    PT Time Calculation (min) 33 min    Activity Tolerance Patient tolerated treatment well    Behavior During Therapy [facility] for tasks assessed/performed             Past Medical History:  Diagnosis Date   Anxiety    COPD (chronic obstructive pulmonary disease) ([place])    Depression    Essential hypertension    GERD (gastroesophageal reflux disease)    H/O hiatal hernia    History of cardiac catheterization    Normal coronaries May 2017   History of ITP    Childhood   Hypothyroidism    Osteoarthritis    Type 2 diabetes mellitus ([place])    no meds    Past Surgical History:  Procedure Laterality Date   ANTERIOR CERVICAL DECOMP/DISCECTOMY FUSION  01/21/2012   Procedure: ANTERIOR CERVICAL DECOMPRESSION/DISCECTOMY FUSION 2 LEVELS;  Surgeon: Erline Levine, MD;  Location: [facility];  Service: Neurosurgery;  Laterality: N/A;  Cervical Six-Seven, Cervical Seven-Thoracic One, Anterior Cervical Decompression with Fusion Interbody Prothesis Plating and Bonegraft possible posterior Cervical Seven-Thoracic One Foraminotomy   CARDIAC CATHETERIZATION  [phone]   normal coronary arteries   CARDIAC CATHETERIZATION N/A 01/22/2016   Procedure: Left Heart Cath and Coronary Angiography;  Surgeon: Burnell Blanks, MD;  Location: [facility] CV LAB;  Service: Cardiovascular;  Laterality: N/A;   CARPAL TUNNEL RELEASE Bilateral    CERVICAL DISC SURGERY  04   CHOLECYSTECTOMY N/A 10/26/2014   Procedure:  LAPAROSCOPIC CHOLECYSTECTOMY;  Surgeon: Jamesetta So, MD;  Location: [facility] ORS;  Service: General;  Laterality: N/A;   ESOPHAGOGASTRODUODENOSCOPY N/A 05/03/2016   Procedure: ESOPHAGOGASTRODUODENOSCOPY (EGD);  Surgeon: Rogene Houston, MD;  Location: [facility] ENDO SUITE;  Service: Endoscopy;  Laterality: N/A;  10:30   HERNIA REPAIR Right 60's   IR IMAGING GUIDED PORT INSERTION  06/27/2021   Left elbow bursa removed     NASOPHARYNGOSCOPY N/A 06/04/2021   Procedure: NASAL ENDOSCOPY WITH BIOPSY OF NASOPHARYNX; FROZEN SECTION;  Surgeon: Izora Gala, MD;  Location: [facility];  Service: ENT;  Laterality: N/A;   TONSILLECTOMY  74    There were no vitals filed for this visit.    Subjective Assessment - 10/04/21 0935     Subjective I woke up and my 4th and 5th finger were numb and the outside of my hand was numb.    Pertinent History SCC of the Nasopharynx, stage III (T3, N2, M0), 9/09 /22 CT neck revealed an aggressive nasopharyngeal tumor with bilateral lymphadenopathy and skull base erosion. 06/04/21 Biopsy of Nasopharyngeal mass revealed SCC, EBV negative. 06/19/21 PET revealed Substantially reduced size of the posterior nasopharyngeal mass. Current maximum SUV is 12.5, compatible with malignancy. There still adjacent sclerosis and irregularity in the clivus probably from some degree of local invasion. Bilateral single level IIa lymph nodes are mildly enlarged and hypermetabolic as detailed above, compatible with malignant involvement.. No findings of metastatic disease to the chest, abdomen,  or pelvis. 06/19/21 Trigeminal MRI revealed extensive tumor invasion of the skull base, throughout an area of roughly 4 cm in the midline including the entire clivus, posterior sphenoid sinuses, and the floor of the bony sella. No trigeminal or other distinct cranial nerve involvement was otherwise seen. MRI also again revealed the the primary bulky nasopharyngeal soft tissue mass measuring roughly 2 cm, as well as the visible  abnormal bilateral level 2 nodes; both appeared stable since imaging last month. Consult with Dr. Benay Spice on 06/12/21 and Dr. Isidore Moos on 06/20/21. Treatment options were discussed and the patient opted to proceed with induction chemo then proceed with chemo/radiation after. He received 3 cycles of chemotherapy which completed on 08/17/21.  Restaging PET on 09/14/21 demonstrated an interval response to therapy, defined by a decrease in size and degree of FDG uptake associated with the FDG avid posterior nasopharyngeal lesion. He will receive 35 fractions of radiation to his Nasopharynx and bilateral neck with weekly carboplatin.  CT simulation 1/9. He will start treatment on 10/03/21 and will complete 11/20/21. PAC placed 06/27/21. PEG to be placed as needed; also hx of COPD, DM2, history of cervical fusions in 2004 and 2013    Patient Stated Goals to gain info from providers    Currently in Pain? No/denies    Pain Score 0-No pain                [facility] PT Assessment - 10/04/21 0001       Assessment   Medical Diagnosis SCC of nasopharynx    Referring Provider (PT) Isidore Moos    Onset Date/Surgical Date 06/04/21    Hand Dominance Right    Prior Therapy none      Precautions   Precautions Other (comment)   rheumatoid arthritis   Precaution Comments active cancer      Balance Screen   Has the patient fallen in the past 6 months No    Has the patient had a decrease in activity level because of a fear of falling?  No    Is the patient reluctant to leave their home because of a fear of falling?  No      Home Social worker Private residence    Living Arrangements Spouse/significant other;Children    Available Help at Discharge Family    Type of [place]      Prior Function   Level of [place] On disability   short term   Vocation Requirements truck driver    Leisure pt does not exercise      Cognition   Overall Cognitive Status Within  Functional Limits for tasks assessed      Functional Tests   Functional tests Sit to Stand      Sit to Stand   Comments 30 sec sit to stand: 10 reps avg is 15 reps for his age      Posture/Postural Control   Posture/Postural Control Postural limitations    Postural Limitations Rounded Shoulders;Forward head      AROM   Cervical Flexion WFL    Cervical Extension 25% limited    Cervical - Right Side Bend 50% limited    Cervical - Left Side Bend 25% limited    Cervical - Right Rotation 25% limited    Cervical - Left Rotation 25% limited      Ambulation/Gait   Ambulation/Gait Yes    Ambulation/Gait Assistance 7: Independent    Ambulation Distance (Feet) 10 Feet  Gait Pattern Within Functional Limits    Ambulation Surface Level               LYMPHEDEMA/ONCOLOGY QUESTIONNAIRE - 10/04/21 0001       Lymphedema Assessments   Lymphedema Assessments Head and Neck      Head and Neck   4 cm superior to sternal notch around neck 44 cm    6 cm superior to sternal notch around neck 46 cm    8 cm superior to sternal notch around neck 48 cm                     Objective measurements completed on examination: See above findings.                PT Education - 10/04/21 0936     Education Details Neck ROM, importance of posture when sitting, standing and lying down, deep breathing, walking program and importance of staying active throughout treatment, CURE article on staying active, "Why exercise?" flyer, lymphedema and PT info    Person(s) Educated Patient    Methods Explanation;Handout    Comprehension Verbalized understanding                 PT Long Term Goals - 10/04/21 5409       PT LONG TERM GOAL #1   Title Pt will return to baseline cervical ROM and not demonstrate any signs or symptoms of lymphedema.    Time 10    Period Weeks    Status New    Target Date 12/13/21                Head and Neck Clinic Goals - 10/04/21 8119        Patient will be able to verbalize understanding of a home exercise program for cervical range of motion, posture, and walking.          Time 1    Period Days    Status Achieved      Patient will be able to verbalize understanding of proper sitting and standing posture.          Time 1    Period Days    Status Achieved      Patient will be able to verbalize understanding of lymphedema risk and availability of treatment for this condition.          Time 1    Period Days    Status Achieved                Plan - 10/04/21 0948     Clinical Impression Statement Pt presents to PT with recently diagnosed SCC of naropharynx. Pt has completed induction chemo and then will proceed with chemo/radiation. His cervical ROM is limited at baseline due to neck fusions in 2004 and 2013. The only disc that remains is between C5 and C6. Educated pt about signs and symptoms of lymphedema as well as anatomy and physiology of lymphatic system. Educated pt in importance of staying as active as possible throughout treatment to decrease fatigue as well as head and neck ROM exercises to decrease loss of ROM. Will see pt after completion of radiation to reassess ROM and assess for lymphedema to determine therapy needs at that time.    Stability/Clinical Decision Making Stable/Uncomplicated    Clinical Decision Making Low    Rehab Potential Good    PT Frequency --   eval and 1 f/u visit   PT Duration --  10 weeks   PT Treatment/Interventions ADLs/Self Care Home Management;Therapeutic exercise;Patient/family education    PT Next Visit Plan reassess baselines    PT Home Exercise Plan head and neck ROM exercises    Consulted and Agree with Plan of Care Patient             Patient will benefit from skilled therapeutic intervention in order to improve the following deficits and impairments:  Postural dysfunction, Decreased knowledge of precautions  Visit Diagnosis: Abnormal posture  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 (HCC)     Problem List Patient Active Problem List   Diagnosis Date Noted   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 ([place]) 06/22/2021   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[place]) 06/12/2021   Chest pain of uncertain etiology    Former smoker 04/18/2017   Primary insomnia 04/14/2017   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   History of positive PPD treated with INH  04/14/2017   Abscess of left olecranon bursa 11/07/2016   High risk medication use 11/07/2016   Cervical disc disease 11/07/2016   Primary osteoarthritis of both hands 11/07/2016   Primary osteoarthritis of both knees 11/07/2016   Primary osteoarthritis of both shoulders 11/07/2016   Precordial pain 01/22/2016   Pain in the chest    Hyperlipidemia 01/20/2016   Depression with anxiety 01/20/2016   GERD (gastroesophageal reflux disease) 01/20/2016   Type 2 diabetes mellitus ([place]) 01/20/2016   Chest pain at rest 09/22/2014   Tobacco use 09/22/2014   Morbid obesity ([place]) 09/22/2014   Rheumatoid arthritis ([place]) 09/22/2014   Family history of heart disease 09/22/2014   Chest pain 09/22/2014    Allyson Sabal [place], PT 10/04/2021, 10:43 AM  [facility] @ [address] [place], [place], [address] Phone: [phone]   Fax:  [phone]  Name: Dustin [name] MRN: [id number] Date of Birth: 04-26-1961

## 2021-10-04 NOTE — Patient Instructions (Signed)
[facility]  Discharge Instructions: Thank you for choosing [facility] to provide your oncology and hematology care.   If you have a lab appointment with the [facility], please go directly to the [facility] and check in at the registration area.   Wear comfortable clothing and clothing appropriate for easy access to any Portacath or PICC line.   We strive to give you quality time with your provider. You may need to reschedule your appointment if you arrive late (15 or more minutes).  Arriving late affects you and other patients whose appointments are after yours.  Also, if you miss three or more appointments without notifying the office, you may be dismissed from the clinic at the providers discretion.      For prescription refill requests, have your pharmacy contact our office and allow 72 hours for refills to be completed.    Today you received the following chemotherapy and/or immunotherapy agents Carboplatin     Carboplatin injection What is this medication? CARBOPLATIN (KAR boe pla tin) is a chemotherapy drug. It targets fast dividing cells, like cancer cells, and causes these cells to die. This medicine is used to treat ovarian cancer and many other cancers. This medicine may be used for other purposes; ask your health care provider or pharmacist if you have questions. COMMON BRAND NAME(S): Paraplatin What should I tell my care team before I take this medication? They need to know if you have any of these conditions: blood disorders hearing problems kidney disease recent or ongoing radiation therapy an unusual or allergic reaction to carboplatin, cisplatin, other chemotherapy, other medicines, foods, dyes, or preservatives pregnant or trying to get pregnant breast-feeding How should I use this medication? This drug is usually given as an infusion into a vein. It is administered in a hospital or clinic by a specially trained health  care professional. Talk to your pediatrician regarding the use of this medicine in children. Special care may be needed. Overdosage: If you think you have taken too much of this medicine contact a poison control center or emergency room at once. NOTE: This medicine is only for you. Do not share this medicine with others. What if I miss a dose? It is important not to miss a dose. Call your doctor or health care professional if you are unable to keep an appointment. What may interact with this medication? medicines for seizures medicines to increase blood counts like filgrastim, pegfilgrastim, sargramostim some antibiotics like amikacin, gentamicin, neomycin, streptomycin, tobramycin vaccines Talk to your doctor or health care professional before taking any of these medicines: acetaminophen aspirin ibuprofen ketoprofen naproxen This list may not describe all possible interactions. Give your health care provider a list of all the medicines, herbs, non-prescription drugs, or dietary supplements you use. Also tell them if you smoke, drink alcohol, or use illegal drugs. Some items may interact with your medicine. What should I watch for while using this medication? Your condition will be monitored carefully while you are receiving this medicine. You will need important blood work done while you are taking this medicine. This drug may make you feel generally unwell. This is not uncommon, as chemotherapy can affect healthy cells as well as cancer cells. Report any side effects. Continue your course of treatment even though you feel ill unless your doctor tells you to stop. In some cases, you may be given additional medicines to help with side effects. Follow all directions for their use. Call your  doctor or health care professional for advice if you get a fever, chills or sore throat, or other symptoms of a cold or flu. Do not treat yourself. This drug decreases your body's ability to fight infections.  Try to avoid being around people who are sick. This medicine may increase your risk to bruise or bleed. Call your doctor or health care professional if you notice any unusual bleeding. Be careful brushing and flossing your teeth or using a toothpick because you may get an infection or bleed more easily. If you have any dental work done, tell your dentist you are receiving this medicine. Avoid taking products that contain aspirin, acetaminophen, ibuprofen, naproxen, or ketoprofen unless instructed by your doctor. These medicines may hide a fever. Do not become pregnant while taking this medicine. Women should inform their doctor if they wish to become pregnant or think they might be pregnant. There is a potential for serious side effects to an unborn child. Talk to your health care professional or pharmacist for more information. Do not breast-feed an infant while taking this medicine. What side effects may I notice from receiving this medication? Side effects that you should report to your doctor or health care professional as soon as possible: allergic reactions like skin rash, itching or hives, swelling of the face, lips, or tongue signs of infection - fever or chills, cough, sore throat, pain or difficulty passing urine signs of decreased platelets or bleeding - bruising, pinpoint red spots on the skin, black, tarry stools, nosebleeds signs of decreased red blood cells - unusually weak or tired, fainting spells, lightheadedness breathing problems changes in hearing changes in vision chest pain high blood pressure low blood counts - This drug may decrease the number of white blood cells, red blood cells and platelets. You may be at increased risk for infections and bleeding. nausea and vomiting pain, swelling, redness or irritation at the injection site pain, tingling, numbness in the hands or feet problems with balance, talking, walking trouble passing urine or change in the amount of  urine Side effects that usually do not require medical attention (report to your doctor or health care professional if they continue or are bothersome): hair loss loss of appetite metallic taste in the mouth or changes in taste This list may not describe all possible side effects. Call your doctor for medical advice about side effects. You may report side effects to FDA at 1-800-FDA-1088. Where should I keep my medication? This drug is given in a hospital or clinic and will not be stored at home. NOTE: This sheet is a summary. It may not cover all possible information. If you have questions about this medicine, talk to your doctor, pharmacist, or health care provider.  2022 Elsevier/Gold Standard (2008-02-10 00:00:00)    To help prevent nausea and vomiting after your treatment, we encourage you to take your nausea medication as directed.  BELOW ARE SYMPTOMS THAT SHOULD BE REPORTED IMMEDIATELY: *FEVER GREATER THAN 100.4 F (38 C) OR HIGHER *CHILLS OR SWEATING *NAUSEA AND VOMITING THAT IS NOT CONTROLLED WITH YOUR NAUSEA MEDICATION *UNUSUAL SHORTNESS OF BREATH *UNUSUAL BRUISING OR BLEEDING *URINARY PROBLEMS (pain or burning when urinating, or frequent urination) *BOWEL PROBLEMS (unusual diarrhea, constipation, pain near the anus) TENDERNESS IN MOUTH AND THROAT WITH OR WITHOUT PRESENCE OF ULCERS (sore throat, sores in mouth, or a toothache) UNUSUAL RASH, SWELLING OR PAIN  UNUSUAL VAGINAL DISCHARGE OR ITCHING   Items with * indicate a potential emergency and should be followed up as soon  as possible or go to the Emergency Department if any problems should occur.  Please show the CHEMOTHERAPY ALERT CARD or IMMUNOTHERAPY ALERT CARD at check-in to the Emergency Department and triage nurse.  Should you have questions after your visit or need to cancel or reschedule your appointment, please contact [facility]  Dept: [phone]  and follow the prompts.  Office hours  are 8:00 a.m. to 4:30 p.m. Monday - Friday. Please note that voicemails left after 4:00 p.m. may not be returned until the following business day.  We are closed weekends and major holidays. You have access to a nurse at all times for urgent questions. Please call the main number to the clinic Dept: [phone] and follow the prompts.   For any non-urgent questions, you may also contact your provider using MyChart. We now offer e-Visits for anyone 68 and older to request care online for non-urgent symptoms. For details visit [email or web address].   Also download the MyChart app! Go to the app store, search "MyChart", open the app, select [facility], and log in with your MyChart username and password.  Due to Covid, a mask is required upon entering the hospital/clinic. If you do not have a mask, one will be given to you upon arrival. For doctor visits, patients may have 1 support person aged 22 or older with them. For treatment visits, patients cannot have anyone with them due to current Covid guidelines and our immunocompromised population.

## 2021-10-04 NOTE — Progress Notes (Signed)
°  [facility] OFFICE PROGRESS NOTE   Diagnosis: Nasopharynx cancer  INTERVAL HISTORY:   Dustin [name] returns as scheduled.  He began radiation 10/03/2021.  He feels well.  He has a good appetite.  No dysphagia.  No nausea or vomiting.  Bowels are moving.  Objective:  Vital signs in last 24 hours:  Blood pressure 118/69, pulse 93, temperature 98.7 F (37.1 C), temperature source Oral, resp. rate 20, height 6\' 1"  (1.854 m), weight 272 lb (123.4 kg), SpO2 97 %.    HEENT: No thrush or ulcers. Resp: Lungs clear bilaterally. Cardio: Regular rate and rhythm. GI: Abdomen soft and nontender.  No hepatomegaly. Vascular: No leg edema. Port-A-Cath without erythema.  Lab Results:  Lab Results  Component Value Date   WBC 8.6 10/04/2021   HGB 12.5 (L) 10/04/2021   HCT 38.8 (L) 10/04/2021   MCV 99.7 10/04/2021   PLT 201 10/04/2021   NEUTROABS 5.8 10/04/2021    Imaging:  No results found.  Medications: I have reviewed the patient's current medications.  Assessment/Plan: Nasopharyngeal cancer, presenting with left neck adenopathy MRI cervical spine 04/27/2021-bilateral enlarged upper jugular chain lymph nodes.  Right level 2 node measures 18 mm CT neck 05/25/2021-bilateral nasopharyngeal mass eroding the skull base at the level of the clivus and inferior sphenoid sinus, enlarged right jugular digastric node into enlarged left level 2 nodes, mild soft tissue density in the inferior left sphenoid sinus-tumor? 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 MRI face 06/19/2021-extensive tumor invasion of the skull base, no trigeminal or other cranial nerve involvement, abnormal bilateral level 2 nodes PET 06/19/2021-decrease size of nasopharyngeal mass compared to 05/25/2021 CT, invasion of the clivus, single bilateral level 2A lymph nodes mildly enlarged and hypermetabolic, no evidence of distant metastatic disease Cycle 1 gemcitabine/carboplatin 07/06/2021, day  8 gemcitabine 07/05/2021 Cycle 2 gemcitabine/carboplatin 07/27/2021 Cycle 3 gemcitabine/carboplatin 08/17/2021 PET scan 09/14/2021-decrease in size and degree of FDG uptake associated with FDG avid posterior nasopharyngeal lesion.  Residual FDG uptake along the midline of the posterior nasopharynx.  Persistent but improved asymmetric increased uptake within the right tonsillar region.  Resolution of previous FDG avid cervical lymph nodes. Radiation to primary site and bilateral neck beginning 10/03/2021 Concurrent weekly carboplatin beginning 10/04/2021 Rheumatoid arthritis Diabetes COPD Tobacco use Hypothyroid Remote history ITP, age 61    Disposition: Dustin [name] appears stable.  He began radiation yesterday.  Plan for concurrent weekly carboplatin, cycle 1 today.  He agrees to proceed.  CBC and chemistry panel reviewed.  Labs adequate for treatment.  He will return for lab and Carboplatin in 1 week.  We will see him in follow-up in 2 weeks.    Ned Card ANP/GNP-BC   10/04/2021  1:33 PM

## 2021-10-05 ENCOUNTER — Ambulatory Visit
Admission: RE | Admit: 2021-10-05 | Discharge: 2021-10-05 | Disposition: A | Discharge status: Home / Self Care | Payer: BC Managed Care – PPO | Source: Ambulatory Visit | Attending: Radiation Oncology | Admitting: Radiation Oncology

## 2021-10-05 DIAGNOSIS — Z510 Encounter for antineoplastic radiation therapy: Secondary | ICD-10-CM | POA: Diagnosis not present

## 2021-10-05 DIAGNOSIS — C118 Malignant neoplasm of overlapping sites of nasopharynx: Secondary | ICD-10-CM | POA: Diagnosis not present

## 2021-10-05 DIAGNOSIS — J449 Chronic obstructive pulmonary disease, unspecified: Secondary | ICD-10-CM | POA: Diagnosis not present

## 2021-10-05 DIAGNOSIS — C111 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: Secondary | ICD-10-CM | POA: Diagnosis not present

## 2021-10-05 DIAGNOSIS — M069 Rheumatoid arthritis, unspecified: Secondary | ICD-10-CM | POA: Diagnosis not present

## 2021-10-05 DIAGNOSIS — Z79899 Other long term (current) drug therapy: Secondary | ICD-10-CM | POA: Diagnosis not present

## 2021-10-05 DIAGNOSIS — E039 Hypothyroidism, unspecified: Secondary | ICD-10-CM | POA: Diagnosis not present

## 2021-10-05 DIAGNOSIS — D693 Immune thrombocytopenic purpura: Secondary | ICD-10-CM | POA: Diagnosis not present

## 2021-10-05 DIAGNOSIS — F17210 Nicotine dependence, cigarettes, uncomplicated: Secondary | ICD-10-CM | POA: Diagnosis not present

## 2021-10-05 DIAGNOSIS — Z5111 Encounter for antineoplastic chemotherapy: Secondary | ICD-10-CM | POA: Diagnosis not present

## 2021-10-05 DIAGNOSIS — E119 Type 2 diabetes mellitus without complications: Secondary | ICD-10-CM | POA: Diagnosis not present

## 2021-10-06 ENCOUNTER — Other Ambulatory Visit: Payer: Self-pay | Admitting: Oncology

## 2021-10-08 ENCOUNTER — Ambulatory Visit
Admission: RE | Admit: 2021-10-08 | Discharge: 2021-10-08 | Disposition: A | Discharge status: Home / Self Care | Payer: BC Managed Care – PPO | Source: Ambulatory Visit | Attending: Radiation Oncology | Admitting: Radiation Oncology

## 2021-10-08 ENCOUNTER — Other Ambulatory Visit: Payer: Self-pay | Admitting: Radiation Oncology

## 2021-10-08 ENCOUNTER — Other Ambulatory Visit: Payer: Self-pay

## 2021-10-08 DIAGNOSIS — M069 Rheumatoid arthritis, unspecified: Secondary | ICD-10-CM | POA: Diagnosis not present

## 2021-10-08 DIAGNOSIS — C111 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: Secondary | ICD-10-CM

## 2021-10-08 DIAGNOSIS — D693 Immune thrombocytopenic purpura: Secondary | ICD-10-CM | POA: Diagnosis not present

## 2021-10-08 DIAGNOSIS — E119 Type 2 diabetes mellitus without complications: Secondary | ICD-10-CM | POA: Diagnosis not present

## 2021-10-08 DIAGNOSIS — Z5111 Encounter for antineoplastic chemotherapy: Secondary | ICD-10-CM | POA: Diagnosis not present

## 2021-10-08 DIAGNOSIS — Z510 Encounter for antineoplastic radiation therapy: Secondary | ICD-10-CM | POA: Diagnosis not present

## 2021-10-08 DIAGNOSIS — Z79899 Other long term (current) drug therapy: Secondary | ICD-10-CM | POA: Diagnosis not present

## 2021-10-08 DIAGNOSIS — E039 Hypothyroidism, unspecified: Secondary | ICD-10-CM | POA: Diagnosis not present

## 2021-10-08 DIAGNOSIS — J449 Chronic obstructive pulmonary disease, unspecified: Secondary | ICD-10-CM | POA: Diagnosis not present

## 2021-10-08 DIAGNOSIS — C118 Malignant neoplasm of overlapping sites of nasopharynx: Secondary | ICD-10-CM | POA: Diagnosis not present

## 2021-10-08 DIAGNOSIS — F17210 Nicotine dependence, cigarettes, uncomplicated: Secondary | ICD-10-CM | POA: Diagnosis not present

## 2021-10-08 MED ORDER — LIDOCAINE VISCOUS HCL 2 % MT SOLN: OROMUCOSAL | 3 refills | Status: DC

## 2021-10-08 MED ORDER — SONAFINE EX EMUL
1.0000 "application " | Freq: Two times a day (BID) | CUTANEOUS | Status: DC
  Administered 2021-10-08: 1 via TOPICAL

## 2021-10-08 NOTE — Progress Notes (Signed)
Pt here for patient teaching.    Pt given Managing Acute Radiation Side Effects for Head and Neck Cancer handout, skin care instructions, and Sonafine.    Reviewed areas of pertinence such as fatigue, hair loss, mouth changes, skin changes, throat changes, earaches, and taste changes .   Pt able to give teach back of to pat skin, use unscented/gentle soap, and drink plenty of water,apply Sonafine bid, avoid applying anything to skin within 4 hours of treatment, and to use an electric razor if they must shave.   Pt demonstrated understanding and verbalizes understanding of information given and will contact nursing with any questions or concerns.    [email or web address]

## 2021-10-09 ENCOUNTER — Ambulatory Visit
Admission: RE | Admit: 2021-10-09 | Discharge: 2021-10-09 | Disposition: A | Discharge status: Home / Self Care | Payer: BC Managed Care – PPO | Source: Ambulatory Visit | Attending: Radiation Oncology | Admitting: Radiation Oncology

## 2021-10-09 DIAGNOSIS — J449 Chronic obstructive pulmonary disease, unspecified: Secondary | ICD-10-CM | POA: Diagnosis not present

## 2021-10-09 DIAGNOSIS — F17210 Nicotine dependence, cigarettes, uncomplicated: Secondary | ICD-10-CM | POA: Diagnosis not present

## 2021-10-09 DIAGNOSIS — Z5111 Encounter for antineoplastic chemotherapy: Secondary | ICD-10-CM | POA: Diagnosis not present

## 2021-10-09 DIAGNOSIS — Z510 Encounter for antineoplastic radiation therapy: Secondary | ICD-10-CM | POA: Diagnosis not present

## 2021-10-09 DIAGNOSIS — E119 Type 2 diabetes mellitus without complications: Secondary | ICD-10-CM | POA: Diagnosis not present

## 2021-10-09 DIAGNOSIS — C118 Malignant neoplasm of overlapping sites of nasopharynx: Secondary | ICD-10-CM | POA: Diagnosis not present

## 2021-10-09 DIAGNOSIS — M069 Rheumatoid arthritis, unspecified: Secondary | ICD-10-CM | POA: Diagnosis not present

## 2021-10-09 DIAGNOSIS — D693 Immune thrombocytopenic purpura: Secondary | ICD-10-CM | POA: Diagnosis not present

## 2021-10-09 DIAGNOSIS — Z79899 Other long term (current) drug therapy: Secondary | ICD-10-CM | POA: Diagnosis not present

## 2021-10-09 DIAGNOSIS — C111 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: Secondary | ICD-10-CM | POA: Diagnosis not present

## 2021-10-09 DIAGNOSIS — E039 Hypothyroidism, unspecified: Secondary | ICD-10-CM | POA: Diagnosis not present

## 2021-10-10 ENCOUNTER — Other Ambulatory Visit: Payer: Self-pay

## 2021-10-10 ENCOUNTER — Ambulatory Visit
Admission: RE | Admit: 2021-10-10 | Discharge: 2021-10-10 | Disposition: A | Discharge status: Home / Self Care | Payer: BC Managed Care – PPO | Source: Ambulatory Visit | Attending: Radiation Oncology | Admitting: Radiation Oncology

## 2021-10-10 DIAGNOSIS — M069 Rheumatoid arthritis, unspecified: Secondary | ICD-10-CM | POA: Diagnosis not present

## 2021-10-10 DIAGNOSIS — Z510 Encounter for antineoplastic radiation therapy: Secondary | ICD-10-CM | POA: Diagnosis not present

## 2021-10-10 DIAGNOSIS — Z5111 Encounter for antineoplastic chemotherapy: Secondary | ICD-10-CM | POA: Diagnosis not present

## 2021-10-10 DIAGNOSIS — E039 Hypothyroidism, unspecified: Secondary | ICD-10-CM | POA: Diagnosis not present

## 2021-10-10 DIAGNOSIS — D693 Immune thrombocytopenic purpura: Secondary | ICD-10-CM | POA: Diagnosis not present

## 2021-10-10 DIAGNOSIS — F17210 Nicotine dependence, cigarettes, uncomplicated: Secondary | ICD-10-CM | POA: Diagnosis not present

## 2021-10-10 DIAGNOSIS — J449 Chronic obstructive pulmonary disease, unspecified: Secondary | ICD-10-CM | POA: Diagnosis not present

## 2021-10-10 DIAGNOSIS — Z79899 Other long term (current) drug therapy: Secondary | ICD-10-CM | POA: Diagnosis not present

## 2021-10-10 DIAGNOSIS — E119 Type 2 diabetes mellitus without complications: Secondary | ICD-10-CM | POA: Diagnosis not present

## 2021-10-10 DIAGNOSIS — C118 Malignant neoplasm of overlapping sites of nasopharynx: Secondary | ICD-10-CM | POA: Diagnosis not present

## 2021-10-10 DIAGNOSIS — C111 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: Secondary | ICD-10-CM | POA: Diagnosis not present

## 2021-10-11 ENCOUNTER — Other Ambulatory Visit: Payer: Self-pay

## 2021-10-11 ENCOUNTER — Inpatient Hospital Stay: Payer: BC Managed Care – PPO

## 2021-10-11 ENCOUNTER — Ambulatory Visit
Admission: RE | Admit: 2021-10-11 | Discharge: 2021-10-11 | Disposition: A | Discharge status: Home / Self Care | Payer: BC Managed Care – PPO | Source: Ambulatory Visit | Attending: Radiation Oncology | Admitting: Radiation Oncology

## 2021-10-11 VITALS — BP 110/69 | HR 85 | Temp 97.3°F | Resp 18 | Ht 73.0 in | Wt 267.0 lb

## 2021-10-11 DIAGNOSIS — E119 Type 2 diabetes mellitus without complications: Secondary | ICD-10-CM | POA: Diagnosis not present

## 2021-10-11 DIAGNOSIS — Z510 Encounter for antineoplastic radiation therapy: Secondary | ICD-10-CM | POA: Diagnosis not present

## 2021-10-11 DIAGNOSIS — D693 Immune thrombocytopenic purpura: Secondary | ICD-10-CM | POA: Diagnosis not present

## 2021-10-11 DIAGNOSIS — Z5111 Encounter for antineoplastic chemotherapy: Secondary | ICD-10-CM | POA: Diagnosis not present

## 2021-10-11 DIAGNOSIS — J449 Chronic obstructive pulmonary disease, unspecified: Secondary | ICD-10-CM | POA: Diagnosis not present

## 2021-10-11 DIAGNOSIS — C119 Malignant neoplasm of nasopharynx, unspecified: Secondary | ICD-10-CM

## 2021-10-11 DIAGNOSIS — Z79899 Other long term (current) drug therapy: Secondary | ICD-10-CM | POA: Diagnosis not present

## 2021-10-11 DIAGNOSIS — E039 Hypothyroidism, unspecified: Secondary | ICD-10-CM | POA: Diagnosis not present

## 2021-10-11 DIAGNOSIS — C111 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: Secondary | ICD-10-CM | POA: Diagnosis not present

## 2021-10-11 DIAGNOSIS — C118 Malignant neoplasm of overlapping sites of nasopharynx: Secondary | ICD-10-CM | POA: Diagnosis not present

## 2021-10-11 DIAGNOSIS — F17210 Nicotine dependence, cigarettes, uncomplicated: Secondary | ICD-10-CM | POA: Diagnosis not present

## 2021-10-11 DIAGNOSIS — M069 Rheumatoid arthritis, unspecified: Secondary | ICD-10-CM | POA: Diagnosis not present

## 2021-10-11 LAB — CBC WITH DIFFERENTIAL (CANCER CENTER ONLY)
Abs Immature Granulocytes: 0.03 10*3/uL (ref 0.00–0.07)
Basophils Absolute: 0 10*3/uL (ref 0.0–0.1)
Basophils Relative: 1 %
Eosinophils Absolute: 0.2 10*3/uL (ref 0.0–0.5)
Eosinophils Relative: 3 %
HCT: 39.2 % (ref 39.0–52.0)
Hemoglobin: 13 g/dL (ref 13.0–17.0)
Immature Granulocytes: 1 %
Lymphocytes Relative: 15 %
Lymphs Abs: 0.9 10*3/uL (ref 0.7–4.0)
MCH: 32.8 pg (ref 26.0–34.0)
MCHC: 33.2 g/dL (ref 30.0–36.0)
MCV: 99 fL (ref 80.0–100.0)
Monocytes Absolute: 0.6 10*3/uL (ref 0.1–1.0)
Monocytes Relative: 9 %
Neutro Abs: 4.6 10*3/uL (ref 1.7–7.7)
Neutrophils Relative %: 71 %
Platelet Count: 199 10*3/uL (ref 150–400)
RBC: 3.96 MIL/uL — ABNORMAL LOW (ref 4.22–5.81)
RDW: 13.2 % (ref 11.5–15.5)
WBC Count: 6.3 10*3/uL (ref 4.0–10.5)
nRBC: 0 % (ref 0.0–0.2)

## 2021-10-11 LAB — CMP (CANCER CENTER ONLY)
ALT: 18 U/L (ref 0–44)
AST: 15 U/L (ref 15–41)
Albumin: 4.4 g/dL (ref 3.5–5.0)
Alkaline Phosphatase: 43 U/L (ref 38–126)
Anion gap: 7 (ref 5–15)
BUN: 22 mg/dL (ref 8–23)
CO2: 29 mmol/L (ref 22–32)
Calcium: 9.5 mg/dL (ref 8.9–10.3)
Chloride: 101 mmol/L (ref 98–111)
Creatinine: 0.88 mg/dL (ref 0.61–1.24)
GFR, Estimated: >60 mL/min (ref >60)
Glucose, Bld: 254 mg/dL — ABNORMAL HIGH (ref 70–99)
Potassium: 4.4 mmol/L (ref 3.5–5.1)
Sodium: 137 mmol/L (ref 135–145)
Total Bilirubin: 1.3 mg/dL — ABNORMAL HIGH (ref 0.3–1.2)
Total Protein: 7.3 g/dL (ref 6.5–8.1)

## 2021-10-11 MED ORDER — HEPARIN SOD (PORK) LOCK FLUSH 100 UNIT/ML IV SOLN
500.0000 [IU] | Freq: Once | INTRAVENOUS | Status: AC | PRN
  Administered 2021-10-11: 500 [IU]

## 2021-10-11 MED ORDER — SODIUM CHLORIDE 0.9 % IV SOLN
10.0000 mg | Freq: Once | INTRAVENOUS | Status: AC
  Administered 2021-10-11: 10 mg via INTRAVENOUS
  Filled 2021-10-11: qty 1

## 2021-10-11 MED ORDER — SODIUM CHLORIDE 0.9 % IV SOLN
250.0000 mg | Freq: Once | INTRAVENOUS | Status: AC
  Administered 2021-10-11: 250 mg via INTRAVENOUS
  Filled 2021-10-11: qty 25

## 2021-10-11 MED ORDER — SODIUM CHLORIDE 0.9 % IV SOLN: Freq: Once | INTRAVENOUS | Status: AC

## 2021-10-11 MED ORDER — PALONOSETRON HCL INJECTION 0.25 MG/5ML
0.2500 mg | Freq: Once | INTRAVENOUS | Status: AC
  Administered 2021-10-11: 0.25 mg via INTRAVENOUS
  Filled 2021-10-11: qty 5

## 2021-10-11 MED ORDER — SODIUM CHLORIDE 0.9% FLUSH
10.0000 mL | INTRAVENOUS | Status: DC | PRN
  Administered 2021-10-11: 10 mL

## 2021-10-11 NOTE — Progress Notes (Signed)
Patient presents for treatment. RN assessment completed along with the following:  Labs/vitals reviewed - Yes, and within treatment parameters.   Weight within 10% of previous measurement - Yes Informed consent completed and reflects current therapy/intent - Yes, on date 07/13/21             Provider progress note reviewed - Patient not seen by provider today. Most recent note dated 10/04/21 reviewed. Treatment/Antibody/Supportive plan reviewed - Yes, and there are no adjustments needed for today's treatment. S&H and other orders reviewed - Yes, and there are no additional orders identified. Previous treatment date reviewed - Yes, and the appropriate amount of time has elapsed between treatments.   Patient to proceed with treatment.

## 2021-10-11 NOTE — Patient Instructions (Signed)
[facility]  Discharge Instructions: Thank you for choosing [facility] to provide your oncology and hematology care.   If you have a lab appointment with the [facility], please go directly to the [facility] and check in at the registration area.   Wear comfortable clothing and clothing appropriate for easy access to any Portacath or PICC line.   We strive to give you quality time with your provider. You may need to reschedule your appointment if you arrive late (15 or more minutes).  Arriving late affects you and other patients whose appointments are after yours.  Also, if you miss three or more appointments without notifying the office, you may be dismissed from the clinic at the providers discretion.      For prescription refill requests, have your pharmacy contact our office and allow 72 hours for refills to be completed.    Today you received the following chemotherapy and/or immunotherapy agents Carboplatin     Carboplatin injection What is this medication? CARBOPLATIN (KAR boe pla tin) is a chemotherapy drug. It targets fast dividing cells, like cancer cells, and causes these cells to die. This medicine is used to treat ovarian cancer and many other cancers. This medicine may be used for other purposes; ask your health care provider or pharmacist if you have questions. COMMON BRAND NAME(S): Paraplatin What should I tell my care team before I take this medication? They need to know if you have any of these conditions: blood disorders hearing problems kidney disease recent or ongoing radiation therapy an unusual or allergic reaction to carboplatin, cisplatin, other chemotherapy, other medicines, foods, dyes, or preservatives pregnant or trying to get pregnant breast-feeding How should I use this medication? This drug is usually given as an infusion into a vein. It is administered in a hospital or clinic by a specially trained health  care professional. Talk to your pediatrician regarding the use of this medicine in children. Special care may be needed. Overdosage: If you think you have taken too much of this medicine contact a poison control center or emergency room at once. NOTE: This medicine is only for you. Do not share this medicine with others. What if I miss a dose? It is important not to miss a dose. Call your doctor or health care professional if you are unable to keep an appointment. What may interact with this medication? medicines for seizures medicines to increase blood counts like filgrastim, pegfilgrastim, sargramostim some antibiotics like amikacin, gentamicin, neomycin, streptomycin, tobramycin vaccines Talk to your doctor or health care professional before taking any of these medicines: acetaminophen aspirin ibuprofen ketoprofen naproxen This list may not describe all possible interactions. Give your health care provider a list of all the medicines, herbs, non-prescription drugs, or dietary supplements you use. Also tell them if you smoke, drink alcohol, or use illegal drugs. Some items may interact with your medicine. What should I watch for while using this medication? Your condition will be monitored carefully while you are receiving this medicine. You will need important blood work done while you are taking this medicine. This drug may make you feel generally unwell. This is not uncommon, as chemotherapy can affect healthy cells as well as cancer cells. Report any side effects. Continue your course of treatment even though you feel ill unless your doctor tells you to stop. In some cases, you may be given additional medicines to help with side effects. Follow all directions for their use. Call your  doctor or health care professional for advice if you get a fever, chills or sore throat, or other symptoms of a cold or flu. Do not treat yourself. This drug decreases your body's ability to fight infections.  Try to avoid being around people who are sick. This medicine may increase your risk to bruise or bleed. Call your doctor or health care professional if you notice any unusual bleeding. Be careful brushing and flossing your teeth or using a toothpick because you may get an infection or bleed more easily. If you have any dental work done, tell your dentist you are receiving this medicine. Avoid taking products that contain aspirin, acetaminophen, ibuprofen, naproxen, or ketoprofen unless instructed by your doctor. These medicines may hide a fever. Do not become pregnant while taking this medicine. Women should inform their doctor if they wish to become pregnant or think they might be pregnant. There is a potential for serious side effects to an unborn child. Talk to your health care professional or pharmacist for more information. Do not breast-feed an infant while taking this medicine. What side effects may I notice from receiving this medication? Side effects that you should report to your doctor or health care professional as soon as possible: allergic reactions like skin rash, itching or hives, swelling of the face, lips, or tongue signs of infection - fever or chills, cough, sore throat, pain or difficulty passing urine signs of decreased platelets or bleeding - bruising, pinpoint red spots on the skin, black, tarry stools, nosebleeds signs of decreased red blood cells - unusually weak or tired, fainting spells, lightheadedness breathing problems changes in hearing changes in vision chest pain high blood pressure low blood counts - This drug may decrease the number of white blood cells, red blood cells and platelets. You may be at increased risk for infections and bleeding. nausea and vomiting pain, swelling, redness or irritation at the injection site pain, tingling, numbness in the hands or feet problems with balance, talking, walking trouble passing urine or change in the amount of  urine Side effects that usually do not require medical attention (report to your doctor or health care professional if they continue or are bothersome): hair loss loss of appetite metallic taste in the mouth or changes in taste This list may not describe all possible side effects. Call your doctor for medical advice about side effects. You may report side effects to FDA at 1-800-FDA-1088. Where should I keep my medication? This drug is given in a hospital or clinic and will not be stored at home. NOTE: This sheet is a summary. It may not cover all possible information. If you have questions about this medicine, talk to your doctor, pharmacist, or health care provider.  2022 Elsevier/Gold Standard (2008-02-10 00:00:00)    To help prevent nausea and vomiting after your treatment, we encourage you to take your nausea medication as directed.  BELOW ARE SYMPTOMS THAT SHOULD BE REPORTED IMMEDIATELY: *FEVER GREATER THAN 100.4 F (38 C) OR HIGHER *CHILLS OR SWEATING *NAUSEA AND VOMITING THAT IS NOT CONTROLLED WITH YOUR NAUSEA MEDICATION *UNUSUAL SHORTNESS OF BREATH *UNUSUAL BRUISING OR BLEEDING *URINARY PROBLEMS (pain or burning when urinating, or frequent urination) *BOWEL PROBLEMS (unusual diarrhea, constipation, pain near the anus) TENDERNESS IN MOUTH AND THROAT WITH OR WITHOUT PRESENCE OF ULCERS (sore throat, sores in mouth, or a toothache) UNUSUAL RASH, SWELLING OR PAIN  UNUSUAL VAGINAL DISCHARGE OR ITCHING   Items with * indicate a potential emergency and should be followed up as soon  as possible or go to the Emergency Department if any problems should occur.  Please show the CHEMOTHERAPY ALERT CARD or IMMUNOTHERAPY ALERT CARD at check-in to the Emergency Department and triage nurse.  Should you have questions after your visit or need to cancel or reschedule your appointment, please contact [facility]  Dept: [phone]  and follow the prompts.  Office hours  are 8:00 a.m. to 4:30 p.m. Monday - Friday. Please note that voicemails left after 4:00 p.m. may not be returned until the following business day.  We are closed weekends and major holidays. You have access to a nurse at all times for urgent questions. Please call the main number to the clinic Dept: [phone] and follow the prompts.   For any non-urgent questions, you may also contact your provider using MyChart. We now offer e-Visits for anyone 64 and older to request care online for non-urgent symptoms. For details visit [email or web address].   Also download the MyChart app! Go to the app store, search "MyChart", open the app, select [facility], and log in with your MyChart username and password.  Due to Covid, a mask is required upon entering the hospital/clinic. If you do not have a mask, one will be given to you upon arrival. For doctor visits, patients may have 1 support person aged 110 or older with them. For treatment visits, patients cannot have anyone with them due to current Covid guidelines and our immunocompromised population.

## 2021-10-11 NOTE — Patient Instructions (Signed)

## 2021-10-12 ENCOUNTER — Ambulatory Visit
Admission: RE | Admit: 2021-10-12 | Discharge: 2021-10-12 | Disposition: A | Discharge status: Home / Self Care | Payer: BC Managed Care – PPO | Source: Ambulatory Visit | Attending: Radiation Oncology | Admitting: Radiation Oncology

## 2021-10-12 ENCOUNTER — Inpatient Hospital Stay: Payer: BC Managed Care – PPO | Admitting: Dietician

## 2021-10-12 ENCOUNTER — Other Ambulatory Visit: Payer: Self-pay

## 2021-10-12 DIAGNOSIS — E039 Hypothyroidism, unspecified: Secondary | ICD-10-CM | POA: Diagnosis not present

## 2021-10-12 DIAGNOSIS — C118 Malignant neoplasm of overlapping sites of nasopharynx: Secondary | ICD-10-CM | POA: Diagnosis not present

## 2021-10-12 DIAGNOSIS — C111 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: Secondary | ICD-10-CM | POA: Diagnosis not present

## 2021-10-12 DIAGNOSIS — F17210 Nicotine dependence, cigarettes, uncomplicated: Secondary | ICD-10-CM | POA: Diagnosis not present

## 2021-10-12 DIAGNOSIS — J449 Chronic obstructive pulmonary disease, unspecified: Secondary | ICD-10-CM | POA: Diagnosis not present

## 2021-10-12 DIAGNOSIS — M069 Rheumatoid arthritis, unspecified: Secondary | ICD-10-CM | POA: Diagnosis not present

## 2021-10-12 DIAGNOSIS — Z79899 Other long term (current) drug therapy: Secondary | ICD-10-CM | POA: Diagnosis not present

## 2021-10-12 DIAGNOSIS — D693 Immune thrombocytopenic purpura: Secondary | ICD-10-CM | POA: Diagnosis not present

## 2021-10-12 DIAGNOSIS — Z5111 Encounter for antineoplastic chemotherapy: Secondary | ICD-10-CM | POA: Diagnosis not present

## 2021-10-12 DIAGNOSIS — E119 Type 2 diabetes mellitus without complications: Secondary | ICD-10-CM | POA: Diagnosis not present

## 2021-10-12 DIAGNOSIS — Z510 Encounter for antineoplastic radiation therapy: Secondary | ICD-10-CM | POA: Diagnosis not present

## 2021-10-12 NOTE — Progress Notes (Signed)
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   Met with patient and wife in office. He reports eating regular textures with difficultly and has a good appetite. Patient denies sore throat, altered taste, nausea, vomiting, diarrhea, constipation. His mouth is dry. Patient is not using baking soda salt water rinses. Patient is drinking ~48 ounces of water. He is drinking one caffeine free diet Mt. Dew and one cup of coffee in the morning. Patient reports having chocolate peanut butter Boost at home. He has not tried these yet. Patient reports accidentally drinking 2 large cups of tea last night before bed. This had caffeine and he did not sleep last night. Patient has lots of questions about what to expect as treatment progresses.    Medications: reviewed  Labs: 1/26 glucose 254  Anthropometrics: Last weight 267 lb on 1/26 decreased 5 lbs (1.8%) in one week; significant   1/19 - 272 lb 1/9 - 273 lb  1/3 - 272 lb  12/9 - 265.5 lb  12/2 - 267 lb   NUTRITION DIAGNOSIS: Predicted suboptimal energy intake continues    INTERVENTION:  Reinforced importance of adequate calories and protein to maintain weights/strength Discussed ways to add calories/protein to foods - soft moist high protein foods provided Recommend drink 1-2 oral nutrition supplements/day - samples of Ensure Complete provided today   Discussed strategies for dry mouth - handout provided  Recommend baking soda salt water rinses several times daily - recipe given All questions answered Contact information provided  MONITORING, EVALUATION, GOAL: weight trends, intake    NEXT VISIT: Friday February 3 after radiation (pt aware)

## 2021-10-14 ENCOUNTER — Other Ambulatory Visit: Payer: Self-pay | Admitting: Oncology

## 2021-10-15 ENCOUNTER — Other Ambulatory Visit: Payer: Self-pay

## 2021-10-15 ENCOUNTER — Ambulatory Visit
Admission: RE | Admit: 2021-10-15 | Discharge: 2021-10-15 | Disposition: A | Discharge status: Home / Self Care | Payer: BC Managed Care – PPO | Source: Ambulatory Visit | Attending: Radiation Oncology | Admitting: Radiation Oncology

## 2021-10-15 ENCOUNTER — Encounter: Payer: Self-pay | Admitting: Oncology

## 2021-10-15 DIAGNOSIS — F17210 Nicotine dependence, cigarettes, uncomplicated: Secondary | ICD-10-CM | POA: Diagnosis not present

## 2021-10-15 DIAGNOSIS — C118 Malignant neoplasm of overlapping sites of nasopharynx: Secondary | ICD-10-CM | POA: Diagnosis not present

## 2021-10-15 DIAGNOSIS — D693 Immune thrombocytopenic purpura: Secondary | ICD-10-CM | POA: Diagnosis not present

## 2021-10-15 DIAGNOSIS — Z79899 Other long term (current) drug therapy: Secondary | ICD-10-CM | POA: Diagnosis not present

## 2021-10-15 DIAGNOSIS — M069 Rheumatoid arthritis, unspecified: Secondary | ICD-10-CM | POA: Diagnosis not present

## 2021-10-15 DIAGNOSIS — E039 Hypothyroidism, unspecified: Secondary | ICD-10-CM | POA: Diagnosis not present

## 2021-10-15 DIAGNOSIS — C111 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: Secondary | ICD-10-CM | POA: Diagnosis not present

## 2021-10-15 DIAGNOSIS — Z510 Encounter for antineoplastic radiation therapy: Secondary | ICD-10-CM | POA: Diagnosis not present

## 2021-10-15 DIAGNOSIS — Z5111 Encounter for antineoplastic chemotherapy: Secondary | ICD-10-CM | POA: Diagnosis not present

## 2021-10-15 DIAGNOSIS — J449 Chronic obstructive pulmonary disease, unspecified: Secondary | ICD-10-CM | POA: Diagnosis not present

## 2021-10-15 DIAGNOSIS — E119 Type 2 diabetes mellitus without complications: Secondary | ICD-10-CM | POA: Diagnosis not present

## 2021-10-16 ENCOUNTER — Ambulatory Visit
Admission: RE | Admit: 2021-10-16 | Discharge: 2021-10-16 | Disposition: A | Discharge status: Home / Self Care | Payer: BC Managed Care – PPO | Source: Ambulatory Visit | Attending: Radiation Oncology | Admitting: Radiation Oncology

## 2021-10-16 DIAGNOSIS — Z79899 Other long term (current) drug therapy: Secondary | ICD-10-CM | POA: Diagnosis not present

## 2021-10-16 DIAGNOSIS — C111 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: Secondary | ICD-10-CM | POA: Diagnosis not present

## 2021-10-16 DIAGNOSIS — J449 Chronic obstructive pulmonary disease, unspecified: Secondary | ICD-10-CM | POA: Diagnosis not present

## 2021-10-16 DIAGNOSIS — E039 Hypothyroidism, unspecified: Secondary | ICD-10-CM | POA: Diagnosis not present

## 2021-10-16 DIAGNOSIS — C118 Malignant neoplasm of overlapping sites of nasopharynx: Secondary | ICD-10-CM | POA: Diagnosis not present

## 2021-10-16 DIAGNOSIS — Z5111 Encounter for antineoplastic chemotherapy: Secondary | ICD-10-CM | POA: Diagnosis not present

## 2021-10-16 DIAGNOSIS — F17210 Nicotine dependence, cigarettes, uncomplicated: Secondary | ICD-10-CM | POA: Diagnosis not present

## 2021-10-16 DIAGNOSIS — E119 Type 2 diabetes mellitus without complications: Secondary | ICD-10-CM | POA: Diagnosis not present

## 2021-10-16 DIAGNOSIS — D693 Immune thrombocytopenic purpura: Secondary | ICD-10-CM | POA: Diagnosis not present

## 2021-10-16 DIAGNOSIS — M069 Rheumatoid arthritis, unspecified: Secondary | ICD-10-CM | POA: Diagnosis not present

## 2021-10-16 DIAGNOSIS — Z510 Encounter for antineoplastic radiation therapy: Secondary | ICD-10-CM | POA: Diagnosis not present

## 2021-10-17 ENCOUNTER — Other Ambulatory Visit: Payer: Self-pay

## 2021-10-17 ENCOUNTER — Ambulatory Visit
Admission: RE | Admit: 2021-10-17 | Discharge: 2021-10-17 | Disposition: A | Discharge status: Home / Self Care | Payer: BC Managed Care – PPO | Source: Ambulatory Visit | Attending: Radiation Oncology | Admitting: Radiation Oncology

## 2021-10-17 DIAGNOSIS — C119 Malignant neoplasm of nasopharynx, unspecified: Secondary | ICD-10-CM | POA: Insufficient documentation

## 2021-10-17 DIAGNOSIS — F17210 Nicotine dependence, cigarettes, uncomplicated: Secondary | ICD-10-CM | POA: Diagnosis not present

## 2021-10-17 DIAGNOSIS — E119 Type 2 diabetes mellitus without complications: Secondary | ICD-10-CM | POA: Diagnosis not present

## 2021-10-17 DIAGNOSIS — Z5111 Encounter for antineoplastic chemotherapy: Secondary | ICD-10-CM | POA: Diagnosis not present

## 2021-10-17 DIAGNOSIS — Z79899 Other long term (current) drug therapy: Secondary | ICD-10-CM | POA: Diagnosis not present

## 2021-10-17 DIAGNOSIS — E039 Hypothyroidism, unspecified: Secondary | ICD-10-CM | POA: Diagnosis not present

## 2021-10-17 DIAGNOSIS — J449 Chronic obstructive pulmonary disease, unspecified: Secondary | ICD-10-CM | POA: Diagnosis not present

## 2021-10-17 DIAGNOSIS — C111 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: Secondary | ICD-10-CM | POA: Insufficient documentation

## 2021-10-17 DIAGNOSIS — M069 Rheumatoid arthritis, unspecified: Secondary | ICD-10-CM | POA: Diagnosis not present

## 2021-10-17 DIAGNOSIS — C118 Malignant neoplasm of overlapping sites of nasopharynx: Secondary | ICD-10-CM | POA: Diagnosis not present

## 2021-10-18 ENCOUNTER — Inpatient Hospital Stay: Payer: BC Managed Care – PPO

## 2021-10-18 ENCOUNTER — Inpatient Hospital Stay: Payer: BC Managed Care – PPO | Attending: Nurse Practitioner

## 2021-10-18 ENCOUNTER — Inpatient Hospital Stay: Payer: BC Managed Care – PPO | Admitting: Oncology

## 2021-10-18 ENCOUNTER — Encounter: Payer: Self-pay | Admitting: *Deleted

## 2021-10-18 ENCOUNTER — Ambulatory Visit
Admission: RE | Admit: 2021-10-18 | Discharge: 2021-10-18 | Disposition: A | Discharge status: Home / Self Care | Payer: BC Managed Care – PPO | Source: Ambulatory Visit | Attending: Radiation Oncology | Admitting: Radiation Oncology

## 2021-10-18 VITALS — BP 123/70 | HR 100 | Temp 98.1°F | Resp 19 | Ht 73.0 in | Wt 268.0 lb

## 2021-10-18 DIAGNOSIS — Z79899 Other long term (current) drug therapy: Secondary | ICD-10-CM | POA: Insufficient documentation

## 2021-10-18 DIAGNOSIS — E119 Type 2 diabetes mellitus without complications: Secondary | ICD-10-CM | POA: Insufficient documentation

## 2021-10-18 DIAGNOSIS — F17210 Nicotine dependence, cigarettes, uncomplicated: Secondary | ICD-10-CM | POA: Diagnosis not present

## 2021-10-18 DIAGNOSIS — C119 Malignant neoplasm of nasopharynx, unspecified: Secondary | ICD-10-CM | POA: Diagnosis not present

## 2021-10-18 DIAGNOSIS — M069 Rheumatoid arthritis, unspecified: Secondary | ICD-10-CM | POA: Diagnosis not present

## 2021-10-18 DIAGNOSIS — C118 Malignant neoplasm of overlapping sites of nasopharynx: Secondary | ICD-10-CM | POA: Diagnosis not present

## 2021-10-18 DIAGNOSIS — E039 Hypothyroidism, unspecified: Secondary | ICD-10-CM | POA: Insufficient documentation

## 2021-10-18 DIAGNOSIS — J449 Chronic obstructive pulmonary disease, unspecified: Secondary | ICD-10-CM | POA: Diagnosis not present

## 2021-10-18 DIAGNOSIS — Z5111 Encounter for antineoplastic chemotherapy: Secondary | ICD-10-CM | POA: Diagnosis not present

## 2021-10-18 LAB — CBC WITH DIFFERENTIAL (CANCER CENTER ONLY)
Abs Immature Granulocytes: 0.01 10*3/uL (ref 0.00–0.07)
Basophils Absolute: 0 10*3/uL (ref 0.0–0.1)
Basophils Relative: 0 %
Eosinophils Absolute: 0.1 10*3/uL (ref 0.0–0.5)
Eosinophils Relative: 2 %
HCT: 38 % — ABNORMAL LOW (ref 39.0–52.0)
Hemoglobin: 12.4 g/dL — ABNORMAL LOW (ref 13.0–17.0)
Immature Granulocytes: 0 %
Lymphocytes Relative: 11 %
Lymphs Abs: 0.7 10*3/uL (ref 0.7–4.0)
MCH: 32.4 pg (ref 26.0–34.0)
MCHC: 32.6 g/dL (ref 30.0–36.0)
MCV: 99.2 fL (ref 80.0–100.0)
Monocytes Absolute: 0.6 10*3/uL (ref 0.1–1.0)
Monocytes Relative: 9 %
Neutro Abs: 4.8 10*3/uL (ref 1.7–7.7)
Neutrophils Relative %: 78 %
Platelet Count: 161 10*3/uL (ref 150–400)
RBC: 3.83 MIL/uL — ABNORMAL LOW (ref 4.22–5.81)
RDW: 13.2 % (ref 11.5–15.5)
WBC Count: 6.1 10*3/uL (ref 4.0–10.5)
nRBC: 0 % (ref 0.0–0.2)

## 2021-10-18 LAB — CMP (CANCER CENTER ONLY)
ALT: 15 U/L (ref 0–44)
AST: 15 U/L (ref 15–41)
Albumin: 4.3 g/dL (ref 3.5–5.0)
Alkaline Phosphatase: 45 U/L (ref 38–126)
Anion gap: 8 (ref 5–15)
BUN: 24 mg/dL — ABNORMAL HIGH (ref 8–23)
CO2: 27 mmol/L (ref 22–32)
Calcium: 9.2 mg/dL (ref 8.9–10.3)
Chloride: 100 mmol/L (ref 98–111)
Creatinine: 0.9 mg/dL (ref 0.61–1.24)
GFR, Estimated: >60 mL/min (ref >60)
Glucose, Bld: 278 mg/dL — ABNORMAL HIGH (ref 70–99)
Potassium: 4.2 mmol/L (ref 3.5–5.1)
Sodium: 135 mmol/L (ref 135–145)
Total Bilirubin: 1.4 mg/dL — ABNORMAL HIGH (ref 0.3–1.2)
Total Protein: 7.1 g/dL (ref 6.5–8.1)

## 2021-10-18 MED ORDER — HEPARIN SOD (PORK) LOCK FLUSH 100 UNIT/ML IV SOLN
500.0000 [IU] | Freq: Once | INTRAVENOUS | Status: AC | PRN
  Administered 2021-10-18: 500 [IU]

## 2021-10-18 MED ORDER — PALONOSETRON HCL INJECTION 0.25 MG/5ML
0.2500 mg | Freq: Once | INTRAVENOUS | Status: AC
  Administered 2021-10-18: 0.25 mg via INTRAVENOUS
  Filled 2021-10-18: qty 5

## 2021-10-18 MED ORDER — SODIUM CHLORIDE 0.9 % IV SOLN: Freq: Once | INTRAVENOUS | Status: AC

## 2021-10-18 MED ORDER — SODIUM CHLORIDE 0.9 % IV SOLN
250.0000 mg | Freq: Once | INTRAVENOUS | Status: AC
  Administered 2021-10-18: 250 mg via INTRAVENOUS
  Filled 2021-10-18: qty 25

## 2021-10-18 MED ORDER — SODIUM CHLORIDE 0.9 % IV SOLN
10.0000 mg | Freq: Once | INTRAVENOUS | Status: AC
  Administered 2021-10-18: 10 mg via INTRAVENOUS
  Filled 2021-10-18: qty 10

## 2021-10-18 MED ORDER — SODIUM CHLORIDE 0.9% FLUSH
10.0000 mL | INTRAVENOUS | Status: DC | PRN
  Administered 2021-10-18: 10 mL

## 2021-10-18 NOTE — Progress Notes (Signed)
Patient seen by Dr. Sherrill today ? ?Vitals are within treatment parameters. ? ?Labs reviewed by Dr. Sherrill and are within treatment parameters. ? ?Per physician team, patient is ready for treatment and there are NO modifications to the treatment plan.  ?

## 2021-10-18 NOTE — Patient Instructions (Signed)
[facility]  Discharge Instructions: Thank you for choosing [facility] to provide your oncology and hematology care.   If you have a lab appointment with the [facility], please go directly to the [facility] and check in at the registration area.   Wear comfortable clothing and clothing appropriate for easy access to any Portacath or PICC line.   We strive to give you quality time with your provider. You may need to reschedule your appointment if you arrive late (15 or more minutes).  Arriving late affects you and other patients whose appointments are after yours.  Also, if you miss three or more appointments without notifying the office, you may be dismissed from the clinic at the providers discretion.      For prescription refill requests, have your pharmacy contact our office and allow 72 hours for refills to be completed.    Today you received the following chemotherapy and/or immunotherapy agents Carboplatin       To help prevent nausea and vomiting after your treatment, we encourage you to take your nausea medication as directed.  BELOW ARE SYMPTOMS THAT SHOULD BE REPORTED IMMEDIATELY: *FEVER GREATER THAN 100.4 F (38 C) OR HIGHER *CHILLS OR SWEATING *NAUSEA AND VOMITING THAT IS NOT CONTROLLED WITH YOUR NAUSEA MEDICATION *UNUSUAL SHORTNESS OF BREATH *UNUSUAL BRUISING OR BLEEDING *URINARY PROBLEMS (pain or burning when urinating, or frequent urination) *BOWEL PROBLEMS (unusual diarrhea, constipation, pain near the anus) TENDERNESS IN MOUTH AND THROAT WITH OR WITHOUT PRESENCE OF ULCERS (sore throat, sores in mouth, or a toothache) UNUSUAL RASH, SWELLING OR PAIN  UNUSUAL VAGINAL DISCHARGE OR ITCHING   Items with * indicate a potential emergency and should be followed up as soon as possible or go to the Emergency Department if any problems should occur.  Please show the CHEMOTHERAPY ALERT CARD or IMMUNOTHERAPY ALERT CARD at check-in to  the Emergency Department and triage nurse.  Should you have questions after your visit or need to cancel or reschedule your appointment, please contact [facility]  Dept: [phone]  and follow the prompts.  Office hours are 8:00 a.m. to 4:30 p.m. Monday - Friday. Please note that voicemails left after 4:00 p.m. may not be returned until the following business day.  We are closed weekends and major holidays. You have access to a nurse at all times for urgent questions. Please call the main number to the clinic Dept: [phone] and follow the prompts.   For any non-urgent questions, you may also contact your provider using MyChart. We now offer e-Visits for anyone 51 and older to request care online for non-urgent symptoms. For details visit [email or web address].   Also download the MyChart app! Go to the app store, search "MyChart", open the app, select [facility], and log in with your MyChart username and password.  Due to Covid, a mask is required upon entering the hospital/clinic. If you do not have a mask, one will be given to you upon arrival. For doctor visits, patients may have 1 support person aged 72 or older with them. For treatment visits, patients cannot have anyone with them due to current Covid guidelines and our immunocompromised population.

## 2021-10-18 NOTE — Progress Notes (Signed)
[facility] OFFICE PROGRESS NOTE   Diagnosis: Nasopharynx carcinoma  INTERVAL HISTORY:   Dustin [name] returns as scheduled.  He continues radiation.  He has completed 2 treatments with weekly carboplatin, last given 10/11/2021.  No nausea or vomiting.  He has developed mild soreness and dryness in the mouth and throat.  He is eating and drinking.  He is not taking pain medication.  Objective:  Vital signs in last 24 hours:  Blood pressure 123/70, pulse 100, temperature 98.1 F (36.7 C), temperature source Oral, resp. rate 19, height 6\' 1"  (1.854 m), weight 268 lb (121.6 kg), SpO2 96 %.    HEENT: Erythema at the pharynx, no ulcerations, no thrush Lymphatics: No cervical nodes Resp: Distant breath sounds, no respiratory distress Cardio: Regular rate and rhythm GI: No hepatosplenomegaly, nontender Vascular: No leg edema  Portacath/PICC-without erythema  Lab Results:  Lab Results  Component Value Date   WBC 6.1 10/18/2021   HGB 12.4 (L) 10/18/2021   HCT 38.0 (L) 10/18/2021   MCV 99.2 10/18/2021   PLT 161 10/18/2021   NEUTROABS 4.8 10/18/2021    CMP  Lab Results  Component Value Date   NA 135 10/18/2021   K 4.2 10/18/2021   CL 100 10/18/2021   CO2 27 10/18/2021   GLUCOSE 278 (H) 10/18/2021   BUN 24 (H) 10/18/2021   CREATININE 0.90 10/18/2021   CALCIUM 9.2 10/18/2021   PROT 7.1 10/18/2021   ALBUMIN 4.3 10/18/2021   AST 15 10/18/2021   ALT 15 10/18/2021   ALKPHOS 45 10/18/2021   BILITOT 1.4 (H) 10/18/2021   GFRNONAA >60 10/18/2021   GFRAA 108 09/29/2020     Medications: I have reviewed the patient's current medications.   Assessment/Plan: Nasopharyngeal cancer, presenting with left neck adenopathy MRI cervical spine 04/27/2021-bilateral enlarged upper jugular chain lymph nodes.  Right level 2 node measures 18 mm CT neck 05/25/2021-bilateral nasopharyngeal mass eroding the skull base at the level of the clivus and inferior sphenoid sinus, enlarged  right jugular digastric node into enlarged left level 2 nodes, mild soft tissue density in the inferior left sphenoid sinus-tumor? 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 MRI face 06/19/2021-extensive tumor invasion of the skull base, no trigeminal or other cranial nerve involvement, abnormal bilateral level 2 nodes PET 06/19/2021-decrease size of nasopharyngeal mass compared to 05/25/2021 CT, invasion of the clivus, single bilateral level 2A lymph nodes mildly enlarged and hypermetabolic, no evidence of distant metastatic disease Cycle 1 gemcitabine/carboplatin 07/06/2021, day 8 gemcitabine 07/05/2021 Cycle 2 gemcitabine/carboplatin 07/27/2021 Cycle 3 gemcitabine/carboplatin 08/17/2021 PET scan 09/14/2021-decrease in size and degree of FDG uptake associated with FDG avid posterior nasopharyngeal lesion.  Residual FDG uptake along the midline of the posterior nasopharynx.  Persistent but improved asymmetric increased uptake within the right tonsillar region.  Resolution of previous FDG avid cervical lymph nodes. 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  He is tolerating the chemotherapy and radiation well.  The CBC and chemistry panel are adequate to proceed with chemotherapy today.  He will complete week 3 carboplatin today.  He will return for chemotherapy in 1 week and 2 weeks.  He will be scheduled office visit in 2 weeks.  He will let us know if the mouth/throat pain progresses and we will prescribe oxycodone.  [name], MD  10/18/2021  11:33 AM

## 2021-10-19 ENCOUNTER — Other Ambulatory Visit: Payer: Self-pay

## 2021-10-19 ENCOUNTER — Ambulatory Visit
Admission: RE | Admit: 2021-10-19 | Discharge: 2021-10-19 | Disposition: A | Discharge status: Home / Self Care | Payer: BC Managed Care – PPO | Source: Ambulatory Visit | Attending: Radiation Oncology | Admitting: Radiation Oncology

## 2021-10-19 ENCOUNTER — Inpatient Hospital Stay: Payer: BC Managed Care – PPO | Admitting: Dietician

## 2021-10-19 ENCOUNTER — Other Ambulatory Visit (HOSPITAL_COMMUNITY): Payer: BC Managed Care – PPO | Admitting: Dentistry

## 2021-10-19 DIAGNOSIS — J449 Chronic obstructive pulmonary disease, unspecified: Secondary | ICD-10-CM | POA: Diagnosis not present

## 2021-10-19 DIAGNOSIS — F17210 Nicotine dependence, cigarettes, uncomplicated: Secondary | ICD-10-CM | POA: Diagnosis not present

## 2021-10-19 DIAGNOSIS — C119 Malignant neoplasm of nasopharynx, unspecified: Secondary | ICD-10-CM | POA: Diagnosis not present

## 2021-10-19 DIAGNOSIS — E119 Type 2 diabetes mellitus without complications: Secondary | ICD-10-CM | POA: Diagnosis not present

## 2021-10-19 DIAGNOSIS — E039 Hypothyroidism, unspecified: Secondary | ICD-10-CM | POA: Diagnosis not present

## 2021-10-19 DIAGNOSIS — Z5111 Encounter for antineoplastic chemotherapy: Secondary | ICD-10-CM | POA: Diagnosis not present

## 2021-10-19 DIAGNOSIS — M069 Rheumatoid arthritis, unspecified: Secondary | ICD-10-CM | POA: Diagnosis not present

## 2021-10-19 DIAGNOSIS — Z79899 Other long term (current) drug therapy: Secondary | ICD-10-CM | POA: Diagnosis not present

## 2021-10-19 DIAGNOSIS — C118 Malignant neoplasm of overlapping sites of nasopharynx: Secondary | ICD-10-CM | POA: Diagnosis not present

## 2021-10-19 NOTE — Progress Notes (Signed)
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  °  °Met with patient and wife after radiation treatment. He reports "small twinge" in his throat. Patient has a good appetite and is eating 3 meals daily with good sources of protein. He is drinking ~72 ounces of water, some soda, single cup of coffee and one premier protein. Wife reports unable to find Ensure Plus/equivalent. She has looked at multiple stores. Patient reports dry mouth. He is not using baking soda salt water rinses.  ° ° °Medications: reviewed  ° °Labs: glucose 278, BUN 24 ° °Anthropometrics: Last [facility] weight 265.6 lb on 1/30 decreased  ° °271.2 lb on 1/23 ° °2% in one week; significant  ° ° °NUTRITION DIAGNOSIS: Predicted suboptimal intake ongoing  ° °INTERVENTION:  °Reinforced importance of adequate calories and protein to maintain weights, strength °Encouraged high calorie snacks in between meals - shake recipes given  °Continue drinking Premier Protein as well as additional high calorie shake daily °One complimentary Ensure Plus case provided  °Recommend baking soda salt water rinses several times daily °  ° °MONITORING, EVALUATION, GOAL: weight trends, intake ° ° °NEXT VISIT: Friday February 10 after radiation  ° ° °

## 2021-10-20 ENCOUNTER — Other Ambulatory Visit: Payer: Self-pay | Admitting: Oncology

## 2021-10-22 ENCOUNTER — Ambulatory Visit
Admission: RE | Admit: 2021-10-22 | Discharge: 2021-10-22 | Disposition: A | Discharge status: Home / Self Care | Payer: BC Managed Care – PPO | Source: Ambulatory Visit | Attending: Radiation Oncology | Admitting: Radiation Oncology

## 2021-10-22 ENCOUNTER — Other Ambulatory Visit: Payer: Self-pay

## 2021-10-22 DIAGNOSIS — C118 Malignant neoplasm of overlapping sites of nasopharynx: Secondary | ICD-10-CM | POA: Diagnosis not present

## 2021-10-22 DIAGNOSIS — Z5111 Encounter for antineoplastic chemotherapy: Secondary | ICD-10-CM | POA: Diagnosis not present

## 2021-10-22 DIAGNOSIS — F17210 Nicotine dependence, cigarettes, uncomplicated: Secondary | ICD-10-CM | POA: Diagnosis not present

## 2021-10-22 DIAGNOSIS — Z79899 Other long term (current) drug therapy: Secondary | ICD-10-CM | POA: Diagnosis not present

## 2021-10-22 DIAGNOSIS — C119 Malignant neoplasm of nasopharynx, unspecified: Secondary | ICD-10-CM | POA: Diagnosis not present

## 2021-10-22 DIAGNOSIS — E119 Type 2 diabetes mellitus without complications: Secondary | ICD-10-CM | POA: Diagnosis not present

## 2021-10-22 DIAGNOSIS — M069 Rheumatoid arthritis, unspecified: Secondary | ICD-10-CM | POA: Diagnosis not present

## 2021-10-22 DIAGNOSIS — J449 Chronic obstructive pulmonary disease, unspecified: Secondary | ICD-10-CM | POA: Diagnosis not present

## 2021-10-22 DIAGNOSIS — E039 Hypothyroidism, unspecified: Secondary | ICD-10-CM | POA: Diagnosis not present

## 2021-10-23 ENCOUNTER — Ambulatory Visit
Admission: RE | Admit: 2021-10-23 | Discharge: 2021-10-23 | Disposition: A | Discharge status: Home / Self Care | Payer: BC Managed Care – PPO | Source: Ambulatory Visit | Attending: Radiation Oncology | Admitting: Radiation Oncology

## 2021-10-23 ENCOUNTER — Encounter (HOSPITAL_COMMUNITY): Payer: Self-pay | Admitting: Emergency Medicine

## 2021-10-23 DIAGNOSIS — J449 Chronic obstructive pulmonary disease, unspecified: Secondary | ICD-10-CM | POA: Diagnosis not present

## 2021-10-23 DIAGNOSIS — Z5111 Encounter for antineoplastic chemotherapy: Secondary | ICD-10-CM | POA: Diagnosis not present

## 2021-10-23 DIAGNOSIS — F17210 Nicotine dependence, cigarettes, uncomplicated: Secondary | ICD-10-CM | POA: Diagnosis not present

## 2021-10-23 DIAGNOSIS — E119 Type 2 diabetes mellitus without complications: Secondary | ICD-10-CM | POA: Diagnosis not present

## 2021-10-23 DIAGNOSIS — C118 Malignant neoplasm of overlapping sites of nasopharynx: Secondary | ICD-10-CM | POA: Diagnosis not present

## 2021-10-23 DIAGNOSIS — C119 Malignant neoplasm of nasopharynx, unspecified: Secondary | ICD-10-CM | POA: Diagnosis not present

## 2021-10-23 DIAGNOSIS — Z79899 Other long term (current) drug therapy: Secondary | ICD-10-CM | POA: Diagnosis not present

## 2021-10-23 DIAGNOSIS — E039 Hypothyroidism, unspecified: Secondary | ICD-10-CM | POA: Diagnosis not present

## 2021-10-23 DIAGNOSIS — M069 Rheumatoid arthritis, unspecified: Secondary | ICD-10-CM | POA: Diagnosis not present

## 2021-10-23 NOTE — Progress Notes (Signed)
Long term disability and [facility] paperwork completed and sent to HIM.  Pt would like to pick these up as it is a few different sets of paperwork together.

## 2021-10-24 ENCOUNTER — Ambulatory Visit
Admission: RE | Admit: 2021-10-24 | Discharge: 2021-10-24 | Disposition: A | Discharge status: Home / Self Care | Payer: BC Managed Care – PPO | Source: Ambulatory Visit | Attending: Radiation Oncology | Admitting: Radiation Oncology

## 2021-10-24 ENCOUNTER — Other Ambulatory Visit: Payer: Self-pay

## 2021-10-24 DIAGNOSIS — E039 Hypothyroidism, unspecified: Secondary | ICD-10-CM | POA: Diagnosis not present

## 2021-10-24 DIAGNOSIS — J449 Chronic obstructive pulmonary disease, unspecified: Secondary | ICD-10-CM | POA: Diagnosis not present

## 2021-10-24 DIAGNOSIS — C118 Malignant neoplasm of overlapping sites of nasopharynx: Secondary | ICD-10-CM | POA: Diagnosis not present

## 2021-10-24 DIAGNOSIS — F17210 Nicotine dependence, cigarettes, uncomplicated: Secondary | ICD-10-CM | POA: Diagnosis not present

## 2021-10-24 DIAGNOSIS — C119 Malignant neoplasm of nasopharynx, unspecified: Secondary | ICD-10-CM | POA: Diagnosis not present

## 2021-10-24 DIAGNOSIS — Z79899 Other long term (current) drug therapy: Secondary | ICD-10-CM | POA: Diagnosis not present

## 2021-10-24 DIAGNOSIS — M069 Rheumatoid arthritis, unspecified: Secondary | ICD-10-CM | POA: Diagnosis not present

## 2021-10-24 DIAGNOSIS — Z5111 Encounter for antineoplastic chemotherapy: Secondary | ICD-10-CM | POA: Diagnosis not present

## 2021-10-24 DIAGNOSIS — E119 Type 2 diabetes mellitus without complications: Secondary | ICD-10-CM | POA: Diagnosis not present

## 2021-10-25 ENCOUNTER — Inpatient Hospital Stay: Payer: BC Managed Care – PPO

## 2021-10-25 ENCOUNTER — Ambulatory Visit
Admission: RE | Admit: 2021-10-25 | Discharge: 2021-10-25 | Disposition: A | Discharge status: Home / Self Care | Payer: BC Managed Care – PPO | Source: Ambulatory Visit | Attending: Radiation Oncology | Admitting: Radiation Oncology

## 2021-10-25 ENCOUNTER — Other Ambulatory Visit: Payer: Self-pay

## 2021-10-25 ENCOUNTER — Ambulatory Visit: Payer: Self-pay

## 2021-10-25 VITALS — BP 98/66 | HR 89 | Temp 97.8°F | Resp 18 | Ht 73.0 in | Wt 263.8 lb

## 2021-10-25 DIAGNOSIS — C119 Malignant neoplasm of nasopharynx, unspecified: Secondary | ICD-10-CM

## 2021-10-25 DIAGNOSIS — C118 Malignant neoplasm of overlapping sites of nasopharynx: Secondary | ICD-10-CM | POA: Diagnosis not present

## 2021-10-25 DIAGNOSIS — M069 Rheumatoid arthritis, unspecified: Secondary | ICD-10-CM | POA: Diagnosis not present

## 2021-10-25 DIAGNOSIS — Z79899 Other long term (current) drug therapy: Secondary | ICD-10-CM | POA: Diagnosis not present

## 2021-10-25 DIAGNOSIS — F17210 Nicotine dependence, cigarettes, uncomplicated: Secondary | ICD-10-CM | POA: Diagnosis not present

## 2021-10-25 DIAGNOSIS — E119 Type 2 diabetes mellitus without complications: Secondary | ICD-10-CM | POA: Diagnosis not present

## 2021-10-25 DIAGNOSIS — Z5111 Encounter for antineoplastic chemotherapy: Secondary | ICD-10-CM | POA: Diagnosis not present

## 2021-10-25 DIAGNOSIS — E039 Hypothyroidism, unspecified: Secondary | ICD-10-CM | POA: Diagnosis not present

## 2021-10-25 DIAGNOSIS — J449 Chronic obstructive pulmonary disease, unspecified: Secondary | ICD-10-CM | POA: Diagnosis not present

## 2021-10-25 LAB — CBC WITH DIFFERENTIAL (CANCER CENTER ONLY)
Abs Immature Granulocytes: 0.01 10*3/uL (ref 0.00–0.07)
Basophils Absolute: 0 10*3/uL (ref 0.0–0.1)
Basophils Relative: 0 %
Eosinophils Absolute: 0.1 10*3/uL (ref 0.0–0.5)
Eosinophils Relative: 1 %
HCT: 38 % — ABNORMAL LOW (ref 39.0–52.0)
Hemoglobin: 12.5 g/dL — ABNORMAL LOW (ref 13.0–17.0)
Immature Granulocytes: 0 %
Lymphocytes Relative: 9 %
Lymphs Abs: 0.6 10*3/uL — ABNORMAL LOW (ref 0.7–4.0)
MCH: 32 pg (ref 26.0–34.0)
MCHC: 32.9 g/dL (ref 30.0–36.0)
MCV: 97.2 fL (ref 80.0–100.0)
Monocytes Absolute: 0.6 10*3/uL (ref 0.1–1.0)
Monocytes Relative: 10 %
Neutro Abs: 4.9 10*3/uL (ref 1.7–7.7)
Neutrophils Relative %: 80 %
Platelet Count: 153 10*3/uL (ref 150–400)
RBC: 3.91 MIL/uL — ABNORMAL LOW (ref 4.22–5.81)
RDW: 13.2 % (ref 11.5–15.5)
WBC Count: 6.2 10*3/uL (ref 4.0–10.5)
nRBC: 0 % (ref 0.0–0.2)

## 2021-10-25 LAB — CMP (CANCER CENTER ONLY)
ALT: 18 U/L (ref 0–44)
AST: 17 U/L (ref 15–41)
Albumin: 4.3 g/dL (ref 3.5–5.0)
Alkaline Phosphatase: 44 U/L (ref 38–126)
Anion gap: 7 (ref 5–15)
BUN: 19 mg/dL (ref 8–23)
CO2: 28 mmol/L (ref 22–32)
Calcium: 9.2 mg/dL (ref 8.9–10.3)
Chloride: 100 mmol/L (ref 98–111)
Creatinine: 0.88 mg/dL (ref 0.61–1.24)
GFR, Estimated: >60 mL/min (ref >60)
Glucose, Bld: 225 mg/dL — ABNORMAL HIGH (ref 70–99)
Potassium: 4.5 mmol/L (ref 3.5–5.1)
Sodium: 135 mmol/L (ref 135–145)
Total Bilirubin: 1.2 mg/dL (ref 0.3–1.2)
Total Protein: 7.2 g/dL (ref 6.5–8.1)

## 2021-10-25 MED ORDER — SODIUM CHLORIDE 0.9 % IV SOLN
250.0000 mg | Freq: Once | INTRAVENOUS | Status: AC
  Administered 2021-10-25: 250 mg via INTRAVENOUS
  Filled 2021-10-25: qty 25

## 2021-10-25 MED ORDER — SODIUM CHLORIDE 0.9% FLUSH
10.0000 mL | INTRAVENOUS | Status: DC | PRN
  Administered 2021-10-25: 10 mL

## 2021-10-25 MED ORDER — SODIUM CHLORIDE 0.9 % IV SOLN
10.0000 mg | Freq: Once | INTRAVENOUS | Status: AC
  Administered 2021-10-25: 10 mg via INTRAVENOUS
  Filled 2021-10-25: qty 1

## 2021-10-25 MED ORDER — PALONOSETRON HCL INJECTION 0.25 MG/5ML
0.2500 mg | Freq: Once | INTRAVENOUS | Status: AC
  Administered 2021-10-25: 0.25 mg via INTRAVENOUS
  Filled 2021-10-25: qty 5

## 2021-10-25 MED ORDER — SODIUM CHLORIDE 0.9 % IV SOLN: Freq: Once | INTRAVENOUS | Status: AC

## 2021-10-25 MED ORDER — HEPARIN SOD (PORK) LOCK FLUSH 100 UNIT/ML IV SOLN
500.0000 [IU] | Freq: Once | INTRAVENOUS | Status: AC | PRN
  Administered 2021-10-25: 500 [IU]

## 2021-10-25 NOTE — Patient Instructions (Signed)
[facility]   Discharge Instructions: Thank you for choosing [facility] to provide your oncology and hematology care.   If you have a lab appointment with the [facility], please go directly to the [facility] and check in at the registration area.   Wear comfortable clothing and clothing appropriate for easy access to any Portacath or PICC line.   We strive to give you quality time with your provider. You may need to reschedule your appointment if you arrive late (15 or more minutes).  Arriving late affects you and other patients whose appointments are after yours.  Also, if you miss three or more appointments without notifying the office, you may be dismissed from the clinic at the providers discretion.      For prescription refill requests, have your pharmacy contact our office and allow 72 hours for refills to be completed.    Today you received the following chemotherapy and/or immunotherapy agents Carboplatin      To help prevent nausea and vomiting after your treatment, we encourage you to take your nausea medication as directed.  BELOW ARE SYMPTOMS THAT SHOULD BE REPORTED IMMEDIATELY: *FEVER GREATER THAN 100.4 F (38 C) OR HIGHER *CHILLS OR SWEATING *NAUSEA AND VOMITING THAT IS NOT CONTROLLED WITH YOUR NAUSEA MEDICATION *UNUSUAL SHORTNESS OF BREATH *UNUSUAL BRUISING OR BLEEDING *URINARY PROBLEMS (pain or burning when urinating, or frequent urination) *BOWEL PROBLEMS (unusual diarrhea, constipation, pain near the anus) TENDERNESS IN MOUTH AND THROAT WITH OR WITHOUT PRESENCE OF ULCERS (sore throat, sores in mouth, or a toothache) UNUSUAL RASH, SWELLING OR PAIN  UNUSUAL VAGINAL DISCHARGE OR ITCHING   Items with * indicate a potential emergency and should be followed up as soon as possible or go to the Emergency Department if any problems should occur.  Please show the CHEMOTHERAPY ALERT CARD or IMMUNOTHERAPY ALERT CARD at check-in to  the Emergency Department and triage nurse.  Should you have questions after your visit or need to cancel or reschedule your appointment, please contact [facility]  Dept: [phone]  and follow the prompts.  Office hours are 8:00 a.m. to 4:30 p.m. Monday - Friday. Please note that voicemails left after 4:00 p.m. may not be returned until the following business day.  We are closed weekends and major holidays. You have access to a nurse at all times for urgent questions. Please call the main number to the clinic Dept: [phone] and follow the prompts.   For any non-urgent questions, you may also contact your provider using MyChart. We now offer e-Visits for anyone 63 and older to request care online for non-urgent symptoms. For details visit [email or web address].   Also download the MyChart app! Go to the app store, search "MyChart", open the app, select [facility], and log in with your MyChart username and password.  Due to Covid, a mask is required upon entering the hospital/clinic. If you do not have a mask, one will be given to you upon arrival. For doctor visits, patients may have 1 support person aged 63 or older with them. For treatment visits, patients cannot have anyone with them due to current Covid guidelines and our immunocompromised population.   Carboplatin injection What is this medication? CARBOPLATIN (KAR boe pla tin) is a chemotherapy drug. It targets fast dividing cells, like cancer cells, and causes these cells to die. This medicine is used to treat ovarian cancer and many other cancers. This medicine may be used for other  purposes; ask your health care provider or pharmacist if you have questions. COMMON BRAND NAME(S): Paraplatin What should I tell my care team before I take this medication? They need to know if you have any of these conditions: blood disorders hearing problems kidney disease recent or ongoing radiation therapy an unusual  or allergic reaction to carboplatin, cisplatin, other chemotherapy, other medicines, foods, dyes, or preservatives pregnant or trying to get pregnant breast-feeding How should I use this medication? This drug is usually given as an infusion into a vein. It is administered in a hospital or clinic by a specially trained health care professional. Talk to your pediatrician regarding the use of this medicine in children. Special care may be needed. Overdosage: If you think you have taken too much of this medicine contact a poison control center or emergency room at once. NOTE: This medicine is only for you. Do not share this medicine with others. What if I miss a dose? It is important not to miss a dose. Call your doctor or health care professional if you are unable to keep an appointment. What may interact with this medication? medicines for seizures medicines to increase blood counts like filgrastim, pegfilgrastim, sargramostim some antibiotics like amikacin, gentamicin, neomycin, streptomycin, tobramycin vaccines Talk to your doctor or health care professional before taking any of these medicines: acetaminophen aspirin ibuprofen ketoprofen naproxen This list may not describe all possible interactions. Give your health care provider a list of all the medicines, herbs, non-prescription drugs, or dietary supplements you use. Also tell them if you smoke, drink alcohol, or use illegal drugs. Some items may interact with your medicine. What should I watch for while using this medication? Your condition will be monitored carefully while you are receiving this medicine. You will need important blood work done while you are taking this medicine. This drug may make you feel generally unwell. This is not uncommon, as chemotherapy can affect healthy cells as well as cancer cells. Report any side effects. Continue your course of treatment even though you feel ill unless your doctor tells you to stop. In  some cases, you may be given additional medicines to help with side effects. Follow all directions for their use. Call your doctor or health care professional for advice if you get a fever, chills or sore throat, or other symptoms of a cold or flu. Do not treat yourself. This drug decreases your body's ability to fight infections. Try to avoid being around people who are sick. This medicine may increase your risk to bruise or bleed. Call your doctor or health care professional if you notice any unusual bleeding. Be careful brushing and flossing your teeth or using a toothpick because you may get an infection or bleed more easily. If you have any dental work done, tell your dentist you are receiving this medicine. Avoid taking products that contain aspirin, acetaminophen, ibuprofen, naproxen, or ketoprofen unless instructed by your doctor. These medicines may hide a fever. Do not become pregnant while taking this medicine. Women should inform their doctor if they wish to become pregnant or think they might be pregnant. There is a potential for serious side effects to an unborn child. Talk to your health care professional or pharmacist for more information. Do not breast-feed an infant while taking this medicine. What side effects may I notice from receiving this medication? Side effects that you should report to your doctor or health care professional as soon as possible: allergic reactions like skin rash, itching or hives,  swelling of the face, lips, or tongue signs of infection - fever or chills, cough, sore throat, pain or difficulty passing urine signs of decreased platelets or bleeding - bruising, pinpoint red spots on the skin, black, tarry stools, nosebleeds signs of decreased red blood cells - unusually weak or tired, fainting spells, lightheadedness breathing problems changes in hearing changes in vision chest pain high blood pressure low blood counts - This drug may decrease the number of  white blood cells, red blood cells and platelets. You may be at increased risk for infections and bleeding. nausea and vomiting pain, swelling, redness or irritation at the injection site pain, tingling, numbness in the hands or feet problems with balance, talking, walking trouble passing urine or change in the amount of urine Side effects that usually do not require medical attention (report to your doctor or health care professional if they continue or are bothersome): hair loss loss of appetite metallic taste in the mouth or changes in taste This list may not describe all possible side effects. Call your doctor for medical advice about side effects. You may report side effects to FDA at 1-800-FDA-1088. Where should I keep my medication? This drug is given in a hospital or clinic and will not be stored at home. NOTE: This sheet is a summary. It may not cover all possible information. If you have questions about this medicine, talk to your doctor, pharmacist, or health care provider.  2022 Elsevier/Gold Standard (2008-02-10 00:00:00)

## 2021-10-25 NOTE — Progress Notes (Signed)
Patient presents for treatment. RN assessment completed along with the following:  Labs/vitals reviewed - Yes, and within treatment parameters.   Weight within 10% of previous measurement - Yes weight loss of 5lbs Informed consent completed and reflects current therapy/intent - Yes, on date 07/13/21             Provider progress note reviewed - Patient not seen by provider today. Most recent note dated 11/07/21 reviewed. Treatment/Antibody/Supportive plan reviewed - Yes, and there are no adjustments needed for today's treatment. S&H and other orders reviewed - Yes, and there are no additional orders identified. Previous treatment date reviewed - Yes, and the appropriate amount of time has elapsed between treatments. Clinic Hand Off Received from - none   Patient to proceed with treatment.

## 2021-10-26 ENCOUNTER — Other Ambulatory Visit: Payer: Self-pay

## 2021-10-26 ENCOUNTER — Ambulatory Visit
Admission: RE | Admit: 2021-10-26 | Discharge: 2021-10-26 | Disposition: A | Discharge status: Home / Self Care | Payer: BC Managed Care – PPO | Source: Ambulatory Visit | Attending: Radiation Oncology | Admitting: Radiation Oncology

## 2021-10-26 ENCOUNTER — Inpatient Hospital Stay: Payer: BC Managed Care – PPO | Admitting: Dietician

## 2021-10-26 DIAGNOSIS — M069 Rheumatoid arthritis, unspecified: Secondary | ICD-10-CM | POA: Diagnosis not present

## 2021-10-26 DIAGNOSIS — Z79899 Other long term (current) drug therapy: Secondary | ICD-10-CM | POA: Diagnosis not present

## 2021-10-26 DIAGNOSIS — J449 Chronic obstructive pulmonary disease, unspecified: Secondary | ICD-10-CM | POA: Diagnosis not present

## 2021-10-26 DIAGNOSIS — E039 Hypothyroidism, unspecified: Secondary | ICD-10-CM | POA: Diagnosis not present

## 2021-10-26 DIAGNOSIS — E119 Type 2 diabetes mellitus without complications: Secondary | ICD-10-CM | POA: Diagnosis not present

## 2021-10-26 DIAGNOSIS — C118 Malignant neoplasm of overlapping sites of nasopharynx: Secondary | ICD-10-CM | POA: Diagnosis not present

## 2021-10-26 DIAGNOSIS — Z5111 Encounter for antineoplastic chemotherapy: Secondary | ICD-10-CM | POA: Diagnosis not present

## 2021-10-26 DIAGNOSIS — F17210 Nicotine dependence, cigarettes, uncomplicated: Secondary | ICD-10-CM | POA: Diagnosis not present

## 2021-10-26 DIAGNOSIS — C119 Malignant neoplasm of nasopharynx, unspecified: Secondary | ICD-10-CM | POA: Diagnosis not present

## 2021-10-26 NOTE — Progress Notes (Signed)
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 Patient has completed 18 of 35 radiation treatments.   Met with patient after radiation. He denies sore throat in the last 3 days. Patient has not needed to use lidocaine rinse. Patient reports mouth sore has healed. He is doing baking soda salt water rinses 3x/day. Patient has a good appetite and tolerating regular without difficulty. He reports "taste buds and dry mouth are terrible" Yesterday he had gravy biscuit with scrambled eggs which tasted good. The egg/cheese croissant this morning was horrible but ate it anyway. Meats have no taste. He is hoping to enjoy chicken wings on Sunday for the Superbowl. Patient is drinking [place]. Dew this morning "because it doesn't hurt to swallow" He continues to drink ~72 ounces of water daily. Patient reports has not slept well the past few nights. He plans to contact nurse navigator Abigail Butts) if this continues.   Medications: reviewed  Labs: 2/9 - glucose 225  Anthropometrics: Last Aria weight 264.2 lb on 2/6  1/30 - 265.6 lb 1/23 - 271.2 lb    NUTRITION DIAGNOSIS: Predicted suboptimal intake stable   INTERVENTION:  Continue eating high calorie high protein foods for weight maintenance Encouraged drinking 2 Ensure Plus/equivalent daily Continue baking soda salt water rinses several times daily    MONITORING, EVALUATION, GOAL: weight trends, intake    NEXT VISIT: Wednesday February 15 after radiation

## 2021-10-29 ENCOUNTER — Other Ambulatory Visit: Payer: Self-pay

## 2021-10-29 ENCOUNTER — Ambulatory Visit
Admission: RE | Admit: 2021-10-29 | Discharge: 2021-10-29 | Disposition: A | Discharge status: Home / Self Care | Payer: BC Managed Care – PPO | Source: Ambulatory Visit | Attending: Radiation Oncology | Admitting: Radiation Oncology

## 2021-10-29 DIAGNOSIS — Z79899 Other long term (current) drug therapy: Secondary | ICD-10-CM | POA: Diagnosis not present

## 2021-10-29 DIAGNOSIS — C119 Malignant neoplasm of nasopharynx, unspecified: Secondary | ICD-10-CM | POA: Diagnosis not present

## 2021-10-29 DIAGNOSIS — E039 Hypothyroidism, unspecified: Secondary | ICD-10-CM | POA: Diagnosis not present

## 2021-10-29 DIAGNOSIS — E119 Type 2 diabetes mellitus without complications: Secondary | ICD-10-CM | POA: Diagnosis not present

## 2021-10-29 DIAGNOSIS — F17210 Nicotine dependence, cigarettes, uncomplicated: Secondary | ICD-10-CM | POA: Diagnosis not present

## 2021-10-29 DIAGNOSIS — J449 Chronic obstructive pulmonary disease, unspecified: Secondary | ICD-10-CM | POA: Diagnosis not present

## 2021-10-29 DIAGNOSIS — C111 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: Secondary | ICD-10-CM

## 2021-10-29 DIAGNOSIS — C118 Malignant neoplasm of overlapping sites of nasopharynx: Secondary | ICD-10-CM | POA: Diagnosis not present

## 2021-10-29 DIAGNOSIS — Z5111 Encounter for antineoplastic chemotherapy: Secondary | ICD-10-CM | POA: Diagnosis not present

## 2021-10-29 DIAGNOSIS — M069 Rheumatoid arthritis, unspecified: Secondary | ICD-10-CM | POA: Diagnosis not present

## 2021-10-29 MED ORDER — SONAFINE EX EMUL
1.0000 "application " | Freq: Two times a day (BID) | CUTANEOUS | Status: DC
  Administered 2021-10-29: 1 via TOPICAL

## 2021-10-30 ENCOUNTER — Other Ambulatory Visit: Payer: Self-pay

## 2021-10-30 ENCOUNTER — Ambulatory Visit
Admission: RE | Admit: 2021-10-30 | Discharge: 2021-10-30 | Disposition: A | Discharge status: Home / Self Care | Payer: BC Managed Care – PPO | Source: Ambulatory Visit | Attending: Radiation Oncology | Admitting: Radiation Oncology

## 2021-10-30 DIAGNOSIS — F17210 Nicotine dependence, cigarettes, uncomplicated: Secondary | ICD-10-CM | POA: Diagnosis not present

## 2021-10-30 DIAGNOSIS — C119 Malignant neoplasm of nasopharynx, unspecified: Secondary | ICD-10-CM | POA: Diagnosis not present

## 2021-10-30 DIAGNOSIS — C118 Malignant neoplasm of overlapping sites of nasopharynx: Secondary | ICD-10-CM | POA: Diagnosis not present

## 2021-10-30 DIAGNOSIS — J449 Chronic obstructive pulmonary disease, unspecified: Secondary | ICD-10-CM | POA: Diagnosis not present

## 2021-10-30 DIAGNOSIS — Z79899 Other long term (current) drug therapy: Secondary | ICD-10-CM | POA: Diagnosis not present

## 2021-10-30 DIAGNOSIS — E119 Type 2 diabetes mellitus without complications: Secondary | ICD-10-CM | POA: Diagnosis not present

## 2021-10-30 DIAGNOSIS — Z5111 Encounter for antineoplastic chemotherapy: Secondary | ICD-10-CM | POA: Diagnosis not present

## 2021-10-30 DIAGNOSIS — E039 Hypothyroidism, unspecified: Secondary | ICD-10-CM | POA: Diagnosis not present

## 2021-10-30 DIAGNOSIS — M069 Rheumatoid arthritis, unspecified: Secondary | ICD-10-CM | POA: Diagnosis not present

## 2021-10-31 ENCOUNTER — Ambulatory Visit: Payer: BC Managed Care – PPO | Attending: Oncology

## 2021-10-31 ENCOUNTER — Inpatient Hospital Stay: Payer: BC Managed Care – PPO | Admitting: Dietician

## 2021-10-31 ENCOUNTER — Ambulatory Visit
Admission: RE | Admit: 2021-10-31 | Discharge: 2021-10-31 | Disposition: A | Discharge status: Home / Self Care | Payer: BC Managed Care – PPO | Source: Ambulatory Visit | Attending: Radiation Oncology | Admitting: Radiation Oncology

## 2021-10-31 ENCOUNTER — Other Ambulatory Visit: Payer: Self-pay

## 2021-10-31 DIAGNOSIS — C118 Malignant neoplasm of overlapping sites of nasopharynx: Secondary | ICD-10-CM | POA: Diagnosis not present

## 2021-10-31 DIAGNOSIS — Z5111 Encounter for antineoplastic chemotherapy: Secondary | ICD-10-CM | POA: Diagnosis not present

## 2021-10-31 DIAGNOSIS — C119 Malignant neoplasm of nasopharynx, unspecified: Secondary | ICD-10-CM | POA: Diagnosis not present

## 2021-10-31 DIAGNOSIS — Z79899 Other long term (current) drug therapy: Secondary | ICD-10-CM | POA: Diagnosis not present

## 2021-10-31 DIAGNOSIS — R1310 Dysphagia, unspecified: Secondary | ICD-10-CM | POA: Insufficient documentation

## 2021-10-31 DIAGNOSIS — M069 Rheumatoid arthritis, unspecified: Secondary | ICD-10-CM | POA: Diagnosis not present

## 2021-10-31 DIAGNOSIS — J449 Chronic obstructive pulmonary disease, unspecified: Secondary | ICD-10-CM | POA: Diagnosis not present

## 2021-10-31 DIAGNOSIS — F17210 Nicotine dependence, cigarettes, uncomplicated: Secondary | ICD-10-CM | POA: Diagnosis not present

## 2021-10-31 DIAGNOSIS — E119 Type 2 diabetes mellitus without complications: Secondary | ICD-10-CM | POA: Diagnosis not present

## 2021-10-31 DIAGNOSIS — E039 Hypothyroidism, unspecified: Secondary | ICD-10-CM | POA: Diagnosis not present

## 2021-10-31 NOTE — Progress Notes (Signed)
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   Met with patient following radiation treatment. He reports "things are getting tough" Patient reports mild sore throat, states "it is mostly numb." He has not needed to use lidocaine rinse. Patient finds a slightly warm cup of coffee is soothing to his throat in the morning. Patient has decreased appetite. Food taste "like crap." Yesterday he had his usual gravy biscuit and eggs for breakfast, a few BBQ chips and Ensure for lunch, scrambled eggs with cheese for dinner and another Ensure before bed. He is drinking decaf iced tea. This taste good to him. He has not drank water in the last day or so.    Medications: reviewed  Labs: 2/9 - glucose 225  Anthropometrics: Last Aria weight 261.8 lb decreased 2.4 lbs in 7 days. Weights have decreased 9.4 lbs (3.5%) in 2 weeks; significant   2/6 - 264.2 lb 1/30 - 265.6 lb 1/23 - 271.2 lb   NUTRITION DIAGNOSIS: Predicted suboptimal intake has evolved to inadequate oral intake related to side effects of radiation therapy for nasopharyngeal cancer as evidenced by reported altered taste of food, decreased appetite, thick saliva, 3.5% decrease in weight in the last 3 weeks.    INTERVENTION:  Reinforced importance of adequate calories and protein to promote weight maintenance Encouraged small frequent meals and snacks Reviewed soft moist high protein foods  Continue drinking Ensure Plus/equivalent, recommend increasing to 3x/day Encouraged pt to try shake recipes  Continue baking soda salt water rinses several times daily Provided support and encouragement     MONITORING, EVALUATION, GOAL: weight trends, intake   NEXT VISIT: Friday February 24 after radiation

## 2021-10-31 NOTE — Therapy (Signed)
[facility] [address] [place], [place], [address] Phone: [phone]   Fax:  [phone]  Speech Language Pathology Treatment  Patient Details  Name: Dustin [name] MRN: [id number] Date of Birth: 10-26-1960 Referring Provider (SLP): Eppie Gibson, MD   Encounter Date: 10/31/2021   End of Session - 10/31/21 1626     Visit Number 2    Number of Visits 4    Date for SLP Re-Evaluation 01/02/22    SLP Start Time 55    SLP Stop Time  2426    SLP Time Calculation (min) 35 min    Activity Tolerance Patient tolerated treatment well             Past Medical History:  Diagnosis Date   Anxiety    COPD (chronic obstructive pulmonary disease) ([place] [place])    Depression    Essential hypertension    GERD (gastroesophageal reflux disease)    H/O hiatal hernia    History of cardiac catheterization    Normal coronaries May 2017   History of ITP    Childhood   Hypothyroidism    Osteoarthritis    Type 2 diabetes mellitus ([facility])    no meds    Past Surgical History:  Procedure Laterality Date   ANTERIOR CERVICAL DECOMP/DISCECTOMY FUSION  01/21/2012   Procedure: ANTERIOR CERVICAL DECOMPRESSION/DISCECTOMY FUSION 2 LEVELS;  Surgeon: Erline Levine, MD;  Location: [facility];  Service: Neurosurgery;  Laterality: N/A;  Cervical Six-Seven, Cervical Seven-Thoracic One, Anterior Cervical Decompression with Fusion Interbody Prothesis Plating and Bonegraft possible posterior Cervical Seven-Thoracic One Foraminotomy   CARDIAC CATHETERIZATION  [id number]   normal coronary arteries   CARDIAC CATHETERIZATION N/A 01/22/2016   Procedure: Left Heart Cath and Coronary Angiography;  Surgeon: Burnell Blanks, MD;  Location: [facility];  Service: Cardiovascular;  Laterality: N/A;   CARPAL TUNNEL RELEASE Bilateral    CERVICAL DISC SURGERY  04   CHOLECYSTECTOMY N/A 10/26/2014   Procedure: LAPAROSCOPIC CHOLECYSTECTOMY;  Surgeon: Jamesetta So,  MD;  Location: [facility];  Service: General;  Laterality: N/A;   ESOPHAGOGASTRODUODENOSCOPY N/A 05/03/2016   Procedure: ESOPHAGOGASTRODUODENOSCOPY (EGD);  Surgeon: Rogene Houston, MD;  Location: [facility];  Service: Endoscopy;  Laterality: N/A;  10:30   HERNIA REPAIR Right 60's   IR IMAGING GUIDED PORT INSERTION  06/27/2021   Left elbow bursa removed     NASOPHARYNGOSCOPY N/A 06/04/2021   Procedure: NASAL ENDOSCOPY WITH BIOPSY OF NASOPHARYNX; FROZEN SECTION;  Surgeon: Izora Gala, MD;  Location: [facility];  Service: ENT;  Laterality: N/A;   TONSILLECTOMY  74    There were no vitals filed for this visit.   Subjective Assessment - 10/31/21 1112     Subjective "I eat anything I want. I even had potato chips yesterday. I had biscuits and gravy and scrambled eggs."    Currently in Pain? No/denies                   ADULT SLP TREATMENT - 10/31/21 1114       General Information   Behavior/Cognition Alert;Cooperative;Pleasant mood      Treatment Provided   Treatment provided Dysphagia      Dysphagia Treatment   Temperature Spikes Noted No    Respiratory Status Room air    Patient observed directly with PO's Yes    Type of PO's observed Thin liquids    Oral Phase Signs & Symptoms Other (comment)   none observed today -  pt denied nasal regurgitation   Pharyngeal Phase Signs & Symptoms Other (comment)   none observed today   Other treatment/comments Pt coughing throughout session - "it's that thick mucous" pt stated. No coughing immediately after bolus sips water or cereal bar. Pt admitted he is doing HEP less than desired frequency. Pt told SLP rationale for HEP independently. SLP strongly suggested pt perform HEP as directed - BID.      Assessment / Recommendations / Plan   Plan Continue with current plan of care      Dysphagia Recommendations   Diet recommendations --   as tolerated   Medication Administration --   as tolerated     Progression Toward Goals   Progression  toward goals Progressing toward goals              SLP Education - 10/31/21 1625     Education Details BID HEP is best    Person(s) Educated Patient    Methods Explanation    Comprehension Verbalized understanding              SLP Short Term Goals - 10/31/21 1627       SLP SHORT TERM GOAL #1   Title pt will complete HEP with rare min A    Period --   visits, for all STGs   Status Achieved      SLP SHORT TERM GOAL #2   Title pt will tell SLP why pt is completing HEP with modified independence in two sessions    Baseline 10-31-21    Time 1    Status On-going      SLP SHORT TERM GOAL #3   Title pt will describe 3 overt s/s aspiration PNA with modified independence    Time 1    Status On-going      SLP SHORT TERM GOAL #4   Title pt will tell SLP how a food journal could hasten return to a more normalized diet    Time 2    Status On-going              SLP Long Term Goals - 10/31/21 1628       SLP LONG TERM GOAL #1   Title pt will complete HEP with modified independence over two visits    Time 3    Period --   visits, for all LTGs   Status On-going      SLP LONG TERM GOAL #2   Title pt will describe how to modify HEP over time, and the timeline associated with reduction in HEP frequency with modified independence over two sessions    Time 5    Status On-going              Plan - 10/31/21 1626     Clinical Impression Statement At this time pt swallowing is deemed WNL/WFL with dys III/thin liquids, however pt reports he has had some regular food items withot overt s/sx of pharyngeal difficulty. SLP designed an individualized HEP for dysphagia and pt completed each exercise on their own with total cues, faded to modified independent. There are no overt s/s aspiration PNA reported by pt at this time, nor were any observed. Data indicate that pt's swallow ability will likely decrease over the course of radiation therapy and could very well decline over  time following conclusion of their radiation therapy due to muscle disuse atrophy and/or muscle fibrosis. Pt will cont to need to be seen by SLP in order to  assess safety of PO intake, assess the need for recommending any objective swallow assessment, and ensuring pt correctly completes the individualized HEP.    Speech Therapy Frequency --   once approx every 4 weeks   Duration --   4 total visits this reporting period; overall plan of care will be no more than 7 total visits   Treatment/Interventions Aspiration precaution training;Pharyngeal strengthening exercises;Diet toleration management by SLP;Compensatory techniques;Internal/external aids;SLP instruction and feedback;Patient/family education;Trials of upgraded texture/liquids;Environmental controls    Potential to Achieve Goals Good    SLP Home Exercise Plan provided today    Consulted and Agree with Plan of Care Patient             Patient will benefit from skilled therapeutic intervention in order to improve the following deficits and impairments:   Dysphagia, unspecified type    Problem List Patient Active Problem List   Diagnosis Date Noted   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 ([facility]) 06/22/2021   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[place]) 06/12/2021   Chest pain of uncertain etiology    Former smoker 04/18/2017   Primary insomnia 04/14/2017   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   History of positive PPD treated with INH  04/14/2017   Abscess of left olecranon bursa 11/07/2016   High risk medication use 11/07/2016   Cervical disc disease 11/07/2016   Primary osteoarthritis of both hands 11/07/2016   Primary osteoarthritis of both knees 11/07/2016   Primary osteoarthritis of both shoulders 11/07/2016   Precordial pain 01/22/2016   Pain in the chest    Hyperlipidemia 01/20/2016   Depression with anxiety 01/20/2016   GERD (gastroesophageal reflux disease) 01/20/2016   Type 2 diabetes mellitus ([place]) 01/20/2016    Chest pain at rest 09/22/2014   Tobacco use 09/22/2014   Morbid obesity ([place]) 09/22/2014   Rheumatoid arthritis ([place]) 09/22/2014   Family history of heart disease 09/22/2014   Chest pain 09/22/2014    [place], [place] 10/31/2021, 4:28 PM  [facility] [place] [address], [place], [place], [address] Phone: [phone]   Fax:  [phone]   Name: Dustin [name] MRN: [id number] Date of Birth: Dec 27, 1960

## 2021-11-01 ENCOUNTER — Inpatient Hospital Stay: Payer: BC Managed Care – PPO

## 2021-11-01 ENCOUNTER — Other Ambulatory Visit: Payer: Self-pay

## 2021-11-01 ENCOUNTER — Ambulatory Visit
Admission: RE | Admit: 2021-11-01 | Discharge: 2021-11-01 | Disposition: A | Discharge status: Home / Self Care | Payer: BC Managed Care – PPO | Source: Ambulatory Visit | Attending: Radiation Oncology | Admitting: Radiation Oncology

## 2021-11-01 ENCOUNTER — Ambulatory Visit: Payer: Self-pay | Admitting: Oncology

## 2021-11-01 ENCOUNTER — Encounter: Payer: Self-pay | Admitting: *Deleted

## 2021-11-01 ENCOUNTER — Ambulatory Visit: Payer: Self-pay

## 2021-11-01 ENCOUNTER — Inpatient Hospital Stay: Payer: BC Managed Care – PPO | Admitting: Oncology

## 2021-11-01 VITALS — BP 105/70 | HR 88 | Temp 98.0°F | Resp 20

## 2021-11-01 VITALS — BP 112/73 | HR 100 | Temp 98.7°F | Resp 18 | Ht 73.0 in | Wt 260.4 lb

## 2021-11-01 DIAGNOSIS — E119 Type 2 diabetes mellitus without complications: Secondary | ICD-10-CM | POA: Diagnosis not present

## 2021-11-01 DIAGNOSIS — C118 Malignant neoplasm of overlapping sites of nasopharynx: Secondary | ICD-10-CM | POA: Diagnosis not present

## 2021-11-01 DIAGNOSIS — F17210 Nicotine dependence, cigarettes, uncomplicated: Secondary | ICD-10-CM | POA: Diagnosis not present

## 2021-11-01 DIAGNOSIS — C119 Malignant neoplasm of nasopharynx, unspecified: Secondary | ICD-10-CM

## 2021-11-01 DIAGNOSIS — E039 Hypothyroidism, unspecified: Secondary | ICD-10-CM | POA: Diagnosis not present

## 2021-11-01 DIAGNOSIS — J449 Chronic obstructive pulmonary disease, unspecified: Secondary | ICD-10-CM | POA: Diagnosis not present

## 2021-11-01 DIAGNOSIS — Z5111 Encounter for antineoplastic chemotherapy: Secondary | ICD-10-CM | POA: Diagnosis not present

## 2021-11-01 DIAGNOSIS — Z79899 Other long term (current) drug therapy: Secondary | ICD-10-CM | POA: Diagnosis not present

## 2021-11-01 DIAGNOSIS — M069 Rheumatoid arthritis, unspecified: Secondary | ICD-10-CM | POA: Diagnosis not present

## 2021-11-01 LAB — CBC WITH DIFFERENTIAL (CANCER CENTER ONLY)
Abs Immature Granulocytes: 0.03 10*3/uL (ref 0.00–0.07)
Basophils Absolute: 0 10*3/uL (ref 0.0–0.1)
Basophils Relative: 1 %
Eosinophils Absolute: 0 10*3/uL (ref 0.0–0.5)
Eosinophils Relative: 1 %
HCT: 38 % — ABNORMAL LOW (ref 39.0–52.0)
Hemoglobin: 12.6 g/dL — ABNORMAL LOW (ref 13.0–17.0)
Immature Granulocytes: 1 %
Lymphocytes Relative: 10 %
Lymphs Abs: 0.6 10*3/uL — ABNORMAL LOW (ref 0.7–4.0)
MCH: 32.3 pg (ref 26.0–34.0)
MCHC: 33.2 g/dL (ref 30.0–36.0)
MCV: 97.4 fL (ref 80.0–100.0)
Monocytes Absolute: 0.5 10*3/uL (ref 0.1–1.0)
Monocytes Relative: 8 %
Neutro Abs: 5.2 10*3/uL (ref 1.7–7.7)
Neutrophils Relative %: 79 %
Platelet Count: 179 10*3/uL (ref 150–400)
RBC: 3.9 MIL/uL — ABNORMAL LOW (ref 4.22–5.81)
RDW: 13.1 % (ref 11.5–15.5)
WBC Count: 6.5 10*3/uL (ref 4.0–10.5)
nRBC: 0 % (ref 0.0–0.2)

## 2021-11-01 LAB — CMP (CANCER CENTER ONLY)
ALT: 20 U/L (ref 0–44)
AST: 18 U/L (ref 15–41)
Albumin: 4.3 g/dL (ref 3.5–5.0)
Alkaline Phosphatase: 51 U/L (ref 38–126)
Anion gap: 8 (ref 5–15)
BUN: 15 mg/dL (ref 8–23)
CO2: 28 mmol/L (ref 22–32)
Calcium: 9.5 mg/dL (ref 8.9–10.3)
Chloride: 98 mmol/L (ref 98–111)
Creatinine: 0.85 mg/dL (ref 0.61–1.24)
GFR, Estimated: >60 mL/min (ref >60)
Glucose, Bld: 208 mg/dL — ABNORMAL HIGH (ref 70–99)
Potassium: 4.4 mmol/L (ref 3.5–5.1)
Sodium: 134 mmol/L — ABNORMAL LOW (ref 135–145)
Total Bilirubin: 0.9 mg/dL (ref 0.3–1.2)
Total Protein: 6.9 g/dL (ref 6.5–8.1)

## 2021-11-01 MED ORDER — HEPARIN SOD (PORK) LOCK FLUSH 100 UNIT/ML IV SOLN
500.0000 [IU] | Freq: Once | INTRAVENOUS | Status: AC | PRN
  Administered 2021-11-01: 500 [IU]

## 2021-11-01 MED ORDER — PALONOSETRON HCL INJECTION 0.25 MG/5ML
0.2500 mg | Freq: Once | INTRAVENOUS | Status: AC
  Administered 2021-11-01: 0.25 mg via INTRAVENOUS
  Filled 2021-11-01: qty 5

## 2021-11-01 MED ORDER — SODIUM CHLORIDE 0.9 % IV SOLN
250.0000 mg | Freq: Once | INTRAVENOUS | Status: AC
  Administered 2021-11-01: 250 mg via INTRAVENOUS
  Filled 2021-11-01: qty 25

## 2021-11-01 MED ORDER — SODIUM CHLORIDE 0.9 % IV SOLN: Freq: Once | INTRAVENOUS | Status: AC

## 2021-11-01 MED ORDER — SODIUM CHLORIDE 0.9% FLUSH
10.0000 mL | INTRAVENOUS | Status: DC | PRN
  Administered 2021-11-01: 10 mL

## 2021-11-01 MED ORDER — SODIUM CHLORIDE 0.9 % IV SOLN
10.0000 mg | Freq: Once | INTRAVENOUS | Status: AC
  Administered 2021-11-01: 10 mg via INTRAVENOUS
  Filled 2021-11-01: qty 1

## 2021-11-01 NOTE — Progress Notes (Signed)
Patient presents for treatment. RN assessment completed along with the following:  Labs/vitals reviewed - Yes, and please see collab nurse note.    Weight within 10% of previous measurement - Yes Informed consent completed and reflects current therapy/intent - Yes, on date 07/13/2021             Provider progress note reviewed - Today's provider note is not yet available. I reviewed the most recent oncology provider progress note in chart dated 10/18/2021. Treatment/Antibody/Supportive plan reviewed - Yes, and there are no adjustments needed for today's treatment. S&H and other orders reviewed - Yes, and there are no additional orders identified. Previous treatment date reviewed - Yes, and the appropriate amount of time has elapsed between treatments. Clinic Hand Off Received from - Yes from [facility], RN  Patient to proceed with treatment.

## 2021-11-01 NOTE — Progress Notes (Signed)
[facility] OFFICE PROGRESS NOTE   Diagnosis: Nasopharynx cancer  INTERVAL HISTORY:   Dustin. [name] returns as scheduled.  He completed another cycle of carboplatin on 10/25/2021.  No nausea.  He continues daily radiation.  He reports dryness and erythema at the neck and soreness in the mouth.  No discrete ulcers.  He is eating.  Objective:  Vital signs in last 24 hours:  Blood pressure 112/73, pulse 100, temperature 98.7 F (37.1 C), temperature source Oral, resp. rate 18, height 6\' 1"  (1.854 m), weight 260 lb 6.4 oz (118.1 kg), SpO2 98 %.    HEENT: Mild erythema of the pharynx, no ulcers.  No thrush.  Resp: Decreased breath sounds at the right lower posterior chest, no respiratory distress Cardio: Regular rate and rhythm GI: No hepatosplenomegaly Vascular: No leg edema  Skin: Erythema with dry flaking at the neck bilaterally  Portacath/PICC-without erythema  Lab Results:  Lab Results  Component Value Date   WBC 6.5 11/01/2021   HGB 12.6 (L) 11/01/2021   HCT 38.0 (L) 11/01/2021   MCV 97.4 11/01/2021   PLT 179 11/01/2021   NEUTROABS 5.2 11/01/2021    CMP  Lab Results  Component Value Date   NA 134 (L) 11/01/2021   K 4.4 11/01/2021   CL 98 11/01/2021   CO2 28 11/01/2021   GLUCOSE 208 (H) 11/01/2021   BUN 15 11/01/2021   CREATININE 0.85 11/01/2021   CALCIUM 9.5 11/01/2021   PROT 6.9 11/01/2021   ALBUMIN 4.3 11/01/2021   AST 18 11/01/2021   ALT 20 11/01/2021   ALKPHOS 51 11/01/2021   BILITOT 0.9 11/01/2021   GFRNONAA >60 11/01/2021   GFRAA 108 09/29/2020     Medications: I have reviewed the patient's current medications.   Assessment/Plan: Nasopharyngeal cancer, presenting with left neck adenopathy MRI cervical spine 04/27/2021-bilateral enlarged upper jugular chain lymph nodes.  Right level 2 node measures 18 mm CT neck 05/25/2021-bilateral nasopharyngeal mass eroding the skull base at the level of the clivus and inferior sphenoid sinus,  enlarged right jugular digastric node into enlarged left level 2 nodes, mild soft tissue density in the inferior left sphenoid sinus-tumor? 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 MRI face 06/19/2021-extensive tumor invasion of the skull base, no trigeminal or other cranial nerve involvement, abnormal bilateral level 2 nodes PET 06/19/2021-decrease size of nasopharyngeal mass compared to 05/25/2021 CT, invasion of the clivus, single bilateral level 2A lymph nodes mildly enlarged and hypermetabolic, no evidence of distant metastatic disease Cycle 1 gemcitabine/carboplatin 07/06/2021, day 8 gemcitabine 07/05/2021 Cycle 2 gemcitabine/carboplatin 07/27/2021 Cycle 3 gemcitabine/carboplatin 08/17/2021 PET scan 09/14/2021-decrease in size and degree of FDG uptake associated with FDG avid posterior nasopharyngeal lesion.  Residual FDG uptake along the midline of the posterior nasopharynx.  Persistent but improved asymmetric increased uptake within the right tonsillar region.  Resolution of previous FDG avid cervical lymph nodes. 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  He is tolerating the treatment well.  I encouraged him to increase his calorie intake as tolerated.  He will complete another treatment with carboplatin today.  The final cycle of carboplatin is scheduled for 1 week.  His blood pressure is running low and he has lost weight.  We will ask him to hold the lisinopril.  Dustin. Stimpson will complete radiation 11/20/2021.  He will  return for an office visit during the week of 12/10/2021.  [name], MD  11/01/2021  11:30 AM

## 2021-11-01 NOTE — Patient Instructions (Signed)
Dustin [name]   Discharge Instructions: Thank you for choosing [facility] to provide your oncology and hematology care.   If you have a lab appointment with the [facility], please go directly to the [facility] and check in at the registration area.   Wear comfortable clothing and clothing appropriate for easy access to any Portacath or PICC line.   We strive to give you quality time with your provider. You may need to reschedule your appointment if you arrive late (15 or more minutes).  Arriving late affects you and other patients whose appointments are after yours.  Also, if you miss three or more appointments without notifying the office, you may be dismissed from the clinic at the providers discretion.      For prescription refill requests, have your pharmacy contact our office and allow 72 hours for refills to be completed.    Today you received the following chemotherapy and/or immunotherapy agents Carboplatin (PARAPLATIN).      To help prevent nausea and vomiting after your treatment, we encourage you to take your nausea medication as directed.  BELOW ARE SYMPTOMS THAT SHOULD BE REPORTED IMMEDIATELY: *FEVER GREATER THAN 100.4 F (38 C) OR HIGHER *CHILLS OR SWEATING *NAUSEA AND VOMITING THAT IS NOT CONTROLLED WITH YOUR NAUSEA MEDICATION *UNUSUAL SHORTNESS OF BREATH *UNUSUAL BRUISING OR BLEEDING *URINARY PROBLEMS (pain or burning when urinating, or frequent urination) *BOWEL PROBLEMS (unusual diarrhea, constipation, pain near the anus) TENDERNESS IN MOUTH AND THROAT WITH OR WITHOUT PRESENCE OF ULCERS (sore throat, sores in mouth, or a toothache) UNUSUAL RASH, SWELLING OR PAIN  UNUSUAL VAGINAL DISCHARGE OR ITCHING   Items with * indicate a potential emergency and should be followed up as soon as possible or go to the Emergency Department if any problems should occur.  Please show the CHEMOTHERAPY ALERT CARD or IMMUNOTHERAPY ALERT CARD  at check-in to the Emergency Department and triage nurse.  Should you have questions after your visit or need to cancel or reschedule your appointment, please contact [facility]  Dept: [phone]  and follow the prompts.  Office hours are 8:00 a.m. to 4:30 p.m. Monday - Friday. Please note that voicemails left after 4:00 p.m. may not be returned until the following business day.  We are closed weekends and major holidays. You have access to a nurse at all times for urgent questions. Please call the main number to the clinic Dept: [phone] and follow the prompts.   For any non-urgent questions, you may also contact your provider using MyChart. We now offer e-Visits for anyone 17 and older to request care online for non-urgent symptoms. For details visit [email or web address].   Also download the MyChart app! Go to the app store, search "MyChart", open the app, select [facility], and log in with your MyChart username and password.  Due to Covid, a mask is required upon entering the hospital/clinic. If you do not have a mask, one will be given to you upon arrival. For doctor visits, patients may have 1 support person aged 73 or older with them. For treatment visits, patients cannot have anyone with them due to current Covid guidelines and our immunocompromised population.   Carboplatin injection What is this medication? CARBOPLATIN (KAR boe pla tin) is a chemotherapy drug. It targets fast dividing cells, like cancer cells, and causes these cells to die. This medicine is used to treat ovarian cancer and many other cancers. This medicine may be used for  other purposes; ask your health care provider or pharmacist if you have questions. COMMON BRAND NAME(S): Paraplatin What should I tell my care team before I take this medication? They need to know if you have any of these conditions: blood disorders hearing problems kidney disease recent or ongoing radiation  therapy an unusual or allergic reaction to carboplatin, cisplatin, other chemotherapy, other medicines, foods, dyes, or preservatives pregnant or trying to get pregnant breast-feeding How should I use this medication? This drug is usually given as an infusion into a vein. It is administered in a hospital or clinic by a specially trained health care professional. Talk to your pediatrician regarding the use of this medicine in children. Special care may be needed. Overdosage: If you think you have taken too much of this medicine contact a poison control center or emergency room at once. NOTE: This medicine is only for you. Do not share this medicine with others. What if I miss a dose? It is important not to miss a dose. Call your doctor or health care professional if you are unable to keep an appointment. What may interact with this medication? medicines for seizures medicines to increase blood counts like filgrastim, pegfilgrastim, sargramostim some antibiotics like amikacin, gentamicin, neomycin, streptomycin, tobramycin vaccines Talk to your doctor or health care professional before taking any of these medicines: acetaminophen aspirin ibuprofen ketoprofen naproxen This list may not describe all possible interactions. Give your health care provider a list of all the medicines, herbs, non-prescription drugs, or dietary supplements you use. Also tell them if you smoke, drink alcohol, or use illegal drugs. Some items may interact with your medicine. What should I watch for while using this medication? Your condition will be monitored carefully while you are receiving this medicine. You will need important blood work done while you are taking this medicine. This drug may make you feel generally unwell. This is not uncommon, as chemotherapy can affect healthy cells as well as cancer cells. Report any side effects. Continue your course of treatment even though you feel ill unless your doctor tells  you to stop. In some cases, you may be given additional medicines to help with side effects. Follow all directions for their use. Call your doctor or health care professional for advice if you get a fever, chills or sore throat, or other symptoms of a cold or flu. Do not treat yourself. This drug decreases your body's ability to fight infections. Try to avoid being around people who are sick. This medicine may increase your risk to bruise or bleed. Call your doctor or health care professional if you notice any unusual bleeding. Be careful brushing and flossing your teeth or using a toothpick because you may get an infection or bleed more easily. If you have any dental work done, tell your dentist you are receiving this medicine. Avoid taking products that contain aspirin, acetaminophen, ibuprofen, naproxen, or ketoprofen unless instructed by your doctor. These medicines may hide a fever. Do not become pregnant while taking this medicine. Women should inform their doctor if they wish to become pregnant or think they might be pregnant. There is a potential for serious side effects to an unborn child. Talk to your health care professional or pharmacist for more information. Do not breast-feed an infant while taking this medicine. What side effects may I notice from receiving this medication? Side effects that you should report to your doctor or health care professional as soon as possible: allergic reactions like skin rash, itching or  hives, swelling of the face, lips, or tongue signs of infection - fever or chills, cough, sore throat, pain or difficulty passing urine signs of decreased platelets or bleeding - bruising, pinpoint red spots on the skin, black, tarry stools, nosebleeds signs of decreased red blood cells - unusually weak or tired, fainting spells, lightheadedness breathing problems changes in hearing changes in vision chest pain high blood pressure low blood counts - This drug may  decrease the number of white blood cells, red blood cells and platelets. You may be at increased risk for infections and bleeding. nausea and vomiting pain, swelling, redness or irritation at the injection site pain, tingling, numbness in the hands or feet problems with balance, talking, walking trouble passing urine or change in the amount of urine Side effects that usually do not require medical attention (report to your doctor or health care professional if they continue or are bothersome): hair loss loss of appetite metallic taste in the mouth or changes in taste This list may not describe all possible side effects. Call your doctor for medical advice about side effects. You may report side effects to FDA at 1-800-FDA-1088. Where should I keep my medication? This drug is given in a hospital or clinic and will not be stored at home. NOTE: This sheet is a summary. It may not cover all possible information. If you have questions about this medicine, talk to your doctor, pharmacist, or health care provider.  2022 Elsevier/Gold Standard (2008-02-10 00:00:00)

## 2021-11-01 NOTE — Progress Notes (Signed)
Patient seen by Dr. Benay Spice today  Vitals are within treatment parameters. MD aware of weight increase of 8 lb.  Labs reviewed by Dr. Benay Spice and are within treatment parameters.  Per physician team, patient is ready for treatment and there are NO modifications to the treatment plan.

## 2021-11-02 ENCOUNTER — Other Ambulatory Visit: Payer: Self-pay

## 2021-11-02 ENCOUNTER — Ambulatory Visit
Admission: RE | Admit: 2021-11-02 | Discharge: 2021-11-02 | Disposition: A | Discharge status: Home / Self Care | Payer: BC Managed Care – PPO | Source: Ambulatory Visit | Attending: Radiation Oncology | Admitting: Radiation Oncology

## 2021-11-02 DIAGNOSIS — E039 Hypothyroidism, unspecified: Secondary | ICD-10-CM | POA: Diagnosis not present

## 2021-11-02 DIAGNOSIS — C119 Malignant neoplasm of nasopharynx, unspecified: Secondary | ICD-10-CM

## 2021-11-02 DIAGNOSIS — Z79899 Other long term (current) drug therapy: Secondary | ICD-10-CM | POA: Diagnosis not present

## 2021-11-02 DIAGNOSIS — Z5111 Encounter for antineoplastic chemotherapy: Secondary | ICD-10-CM | POA: Diagnosis not present

## 2021-11-02 DIAGNOSIS — F17210 Nicotine dependence, cigarettes, uncomplicated: Secondary | ICD-10-CM | POA: Diagnosis not present

## 2021-11-02 DIAGNOSIS — C111 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: Secondary | ICD-10-CM

## 2021-11-02 DIAGNOSIS — C118 Malignant neoplasm of overlapping sites of nasopharynx: Secondary | ICD-10-CM | POA: Diagnosis not present

## 2021-11-02 DIAGNOSIS — M069 Rheumatoid arthritis, unspecified: Secondary | ICD-10-CM | POA: Diagnosis not present

## 2021-11-02 DIAGNOSIS — E119 Type 2 diabetes mellitus without complications: Secondary | ICD-10-CM | POA: Diagnosis not present

## 2021-11-02 DIAGNOSIS — J449 Chronic obstructive pulmonary disease, unspecified: Secondary | ICD-10-CM | POA: Diagnosis not present

## 2021-11-02 MED ORDER — SONAFINE EX EMUL
1.0000 "application " | Freq: Two times a day (BID) | CUTANEOUS | Status: DC
  Administered 2021-11-02: 1 via TOPICAL

## 2021-11-02 MED ORDER — RADIAPLEXRX EX GEL: Freq: Once | CUTANEOUS | Status: DC

## 2021-11-03 ENCOUNTER — Other Ambulatory Visit: Payer: Self-pay | Admitting: Oncology

## 2021-11-05 ENCOUNTER — Ambulatory Visit
Admission: RE | Admit: 2021-11-05 | Discharge: 2021-11-05 | Disposition: A | Discharge status: Home / Self Care | Payer: BC Managed Care – PPO | Source: Ambulatory Visit | Attending: Radiation Oncology | Admitting: Radiation Oncology

## 2021-11-05 ENCOUNTER — Other Ambulatory Visit: Payer: Self-pay

## 2021-11-05 ENCOUNTER — Other Ambulatory Visit: Payer: Self-pay | Admitting: Radiation Oncology

## 2021-11-05 DIAGNOSIS — E039 Hypothyroidism, unspecified: Secondary | ICD-10-CM | POA: Diagnosis not present

## 2021-11-05 DIAGNOSIS — Z79899 Other long term (current) drug therapy: Secondary | ICD-10-CM | POA: Diagnosis not present

## 2021-11-05 DIAGNOSIS — Z5111 Encounter for antineoplastic chemotherapy: Secondary | ICD-10-CM | POA: Diagnosis not present

## 2021-11-05 DIAGNOSIS — F17210 Nicotine dependence, cigarettes, uncomplicated: Secondary | ICD-10-CM | POA: Diagnosis not present

## 2021-11-05 DIAGNOSIS — C111 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: Secondary | ICD-10-CM

## 2021-11-05 DIAGNOSIS — C118 Malignant neoplasm of overlapping sites of nasopharynx: Secondary | ICD-10-CM | POA: Diagnosis not present

## 2021-11-05 DIAGNOSIS — C119 Malignant neoplasm of nasopharynx, unspecified: Secondary | ICD-10-CM | POA: Diagnosis not present

## 2021-11-05 DIAGNOSIS — M069 Rheumatoid arthritis, unspecified: Secondary | ICD-10-CM | POA: Diagnosis not present

## 2021-11-05 DIAGNOSIS — E119 Type 2 diabetes mellitus without complications: Secondary | ICD-10-CM | POA: Diagnosis not present

## 2021-11-05 DIAGNOSIS — J449 Chronic obstructive pulmonary disease, unspecified: Secondary | ICD-10-CM | POA: Diagnosis not present

## 2021-11-05 MED ORDER — BURN RELIEF/LIDOCAINE/ALOE 0.5 % EX GEL: CUTANEOUS | 2 refills | Status: DC

## 2021-11-05 MED ORDER — SONAFINE EX EMUL
1.0000 "application " | Freq: Two times a day (BID) | CUTANEOUS | Status: DC
  Administered 2021-11-05: 1 via TOPICAL

## 2021-11-06 ENCOUNTER — Ambulatory Visit
Admission: RE | Admit: 2021-11-06 | Discharge: 2021-11-06 | Disposition: A | Discharge status: Home / Self Care | Payer: BC Managed Care – PPO | Source: Ambulatory Visit | Attending: Radiation Oncology | Admitting: Radiation Oncology

## 2021-11-06 DIAGNOSIS — Z79899 Other long term (current) drug therapy: Secondary | ICD-10-CM | POA: Diagnosis not present

## 2021-11-06 DIAGNOSIS — C119 Malignant neoplasm of nasopharynx, unspecified: Secondary | ICD-10-CM | POA: Diagnosis not present

## 2021-11-06 DIAGNOSIS — J449 Chronic obstructive pulmonary disease, unspecified: Secondary | ICD-10-CM | POA: Diagnosis not present

## 2021-11-06 DIAGNOSIS — F17210 Nicotine dependence, cigarettes, uncomplicated: Secondary | ICD-10-CM | POA: Diagnosis not present

## 2021-11-06 DIAGNOSIS — C118 Malignant neoplasm of overlapping sites of nasopharynx: Secondary | ICD-10-CM | POA: Diagnosis not present

## 2021-11-06 DIAGNOSIS — E119 Type 2 diabetes mellitus without complications: Secondary | ICD-10-CM | POA: Diagnosis not present

## 2021-11-06 DIAGNOSIS — M069 Rheumatoid arthritis, unspecified: Secondary | ICD-10-CM | POA: Diagnosis not present

## 2021-11-06 DIAGNOSIS — E039 Hypothyroidism, unspecified: Secondary | ICD-10-CM | POA: Diagnosis not present

## 2021-11-06 DIAGNOSIS — Z5111 Encounter for antineoplastic chemotherapy: Secondary | ICD-10-CM | POA: Diagnosis not present

## 2021-11-07 ENCOUNTER — Other Ambulatory Visit: Payer: Self-pay

## 2021-11-07 ENCOUNTER — Ambulatory Visit
Admission: RE | Admit: 2021-11-07 | Discharge: 2021-11-07 | Disposition: A | Discharge status: Home / Self Care | Payer: BC Managed Care – PPO | Source: Ambulatory Visit | Attending: Radiation Oncology | Admitting: Radiation Oncology

## 2021-11-07 DIAGNOSIS — Z79899 Other long term (current) drug therapy: Secondary | ICD-10-CM | POA: Diagnosis not present

## 2021-11-07 DIAGNOSIS — E039 Hypothyroidism, unspecified: Secondary | ICD-10-CM | POA: Diagnosis not present

## 2021-11-07 DIAGNOSIS — Z5111 Encounter for antineoplastic chemotherapy: Secondary | ICD-10-CM | POA: Diagnosis not present

## 2021-11-07 DIAGNOSIS — J449 Chronic obstructive pulmonary disease, unspecified: Secondary | ICD-10-CM | POA: Diagnosis not present

## 2021-11-07 DIAGNOSIS — C118 Malignant neoplasm of overlapping sites of nasopharynx: Secondary | ICD-10-CM | POA: Diagnosis not present

## 2021-11-07 DIAGNOSIS — M069 Rheumatoid arthritis, unspecified: Secondary | ICD-10-CM | POA: Diagnosis not present

## 2021-11-07 DIAGNOSIS — C119 Malignant neoplasm of nasopharynx, unspecified: Secondary | ICD-10-CM | POA: Diagnosis not present

## 2021-11-07 DIAGNOSIS — E119 Type 2 diabetes mellitus without complications: Secondary | ICD-10-CM | POA: Diagnosis not present

## 2021-11-07 DIAGNOSIS — F17210 Nicotine dependence, cigarettes, uncomplicated: Secondary | ICD-10-CM | POA: Diagnosis not present

## 2021-11-08 ENCOUNTER — Inpatient Hospital Stay: Payer: BC Managed Care – PPO

## 2021-11-08 ENCOUNTER — Ambulatory Visit: Payer: Self-pay

## 2021-11-08 ENCOUNTER — Other Ambulatory Visit: Payer: Self-pay

## 2021-11-08 ENCOUNTER — Ambulatory Visit
Admission: RE | Admit: 2021-11-08 | Discharge: 2021-11-08 | Disposition: A | Discharge status: Home / Self Care | Payer: BC Managed Care – PPO | Source: Ambulatory Visit | Attending: Radiation Oncology | Admitting: Radiation Oncology

## 2021-11-08 VITALS — BP 109/67 | HR 80 | Temp 98.1°F | Resp 19 | Ht 73.0 in | Wt 258.4 lb

## 2021-11-08 DIAGNOSIS — Z5111 Encounter for antineoplastic chemotherapy: Secondary | ICD-10-CM | POA: Diagnosis not present

## 2021-11-08 DIAGNOSIS — J449 Chronic obstructive pulmonary disease, unspecified: Secondary | ICD-10-CM | POA: Diagnosis not present

## 2021-11-08 DIAGNOSIS — C119 Malignant neoplasm of nasopharynx, unspecified: Secondary | ICD-10-CM | POA: Diagnosis not present

## 2021-11-08 DIAGNOSIS — F17210 Nicotine dependence, cigarettes, uncomplicated: Secondary | ICD-10-CM | POA: Diagnosis not present

## 2021-11-08 DIAGNOSIS — C118 Malignant neoplasm of overlapping sites of nasopharynx: Secondary | ICD-10-CM | POA: Diagnosis not present

## 2021-11-08 DIAGNOSIS — E039 Hypothyroidism, unspecified: Secondary | ICD-10-CM | POA: Diagnosis not present

## 2021-11-08 DIAGNOSIS — M069 Rheumatoid arthritis, unspecified: Secondary | ICD-10-CM | POA: Diagnosis not present

## 2021-11-08 DIAGNOSIS — E119 Type 2 diabetes mellitus without complications: Secondary | ICD-10-CM | POA: Diagnosis not present

## 2021-11-08 DIAGNOSIS — Z79899 Other long term (current) drug therapy: Secondary | ICD-10-CM | POA: Diagnosis not present

## 2021-11-08 LAB — CBC WITH DIFFERENTIAL (CANCER CENTER ONLY)
Abs Immature Granulocytes: 0.02 10*3/uL (ref 0.00–0.07)
Basophils Absolute: 0 10*3/uL (ref 0.0–0.1)
Basophils Relative: 0 %
Eosinophils Absolute: 0.1 10*3/uL (ref 0.0–0.5)
Eosinophils Relative: 1 %
HCT: 37.3 % — ABNORMAL LOW (ref 39.0–52.0)
Hemoglobin: 12.5 g/dL — ABNORMAL LOW (ref 13.0–17.0)
Immature Granulocytes: 0 %
Lymphocytes Relative: 10 %
Lymphs Abs: 0.5 10*3/uL — ABNORMAL LOW (ref 0.7–4.0)
MCH: 32.6 pg (ref 26.0–34.0)
MCHC: 33.5 g/dL (ref 30.0–36.0)
MCV: 97.1 fL (ref 80.0–100.0)
Monocytes Absolute: 0.5 10*3/uL (ref 0.1–1.0)
Monocytes Relative: 11 %
Neutro Abs: 3.5 10*3/uL (ref 1.7–7.7)
Neutrophils Relative %: 78 %
Platelet Count: 172 10*3/uL (ref 150–400)
RBC: 3.84 MIL/uL — ABNORMAL LOW (ref 4.22–5.81)
RDW: 13 % (ref 11.5–15.5)
WBC Count: 4.5 10*3/uL (ref 4.0–10.5)
nRBC: 0 % (ref 0.0–0.2)

## 2021-11-08 LAB — CMP (CANCER CENTER ONLY)
ALT: 25 U/L (ref 0–44)
AST: 20 U/L (ref 15–41)
Albumin: 4 g/dL (ref 3.5–5.0)
Alkaline Phosphatase: 48 U/L (ref 38–126)
Anion gap: 8 (ref 5–15)
BUN: 16 mg/dL (ref 8–23)
CO2: 28 mmol/L (ref 22–32)
Calcium: 9.3 mg/dL (ref 8.9–10.3)
Chloride: 100 mmol/L (ref 98–111)
Creatinine: 0.87 mg/dL (ref 0.61–1.24)
GFR, Estimated: >60 mL/min (ref >60)
Glucose, Bld: 248 mg/dL — ABNORMAL HIGH (ref 70–99)
Potassium: 4 mmol/L (ref 3.5–5.1)
Sodium: 136 mmol/L (ref 135–145)
Total Bilirubin: 1 mg/dL (ref 0.3–1.2)
Total Protein: 7.2 g/dL (ref 6.5–8.1)

## 2021-11-08 MED ORDER — SODIUM CHLORIDE 0.9 % IV SOLN
250.0000 mg | Freq: Once | INTRAVENOUS | Status: AC
  Administered 2021-11-08: 250 mg via INTRAVENOUS
  Filled 2021-11-08: qty 25

## 2021-11-08 MED ORDER — PALONOSETRON HCL INJECTION 0.25 MG/5ML
0.2500 mg | Freq: Once | INTRAVENOUS | Status: AC
  Administered 2021-11-08: 0.25 mg via INTRAVENOUS
  Filled 2021-11-08: qty 5

## 2021-11-08 MED ORDER — SODIUM CHLORIDE 0.9% FLUSH
10.0000 mL | INTRAVENOUS | Status: DC | PRN
  Administered 2021-11-08: 10 mL

## 2021-11-08 MED ORDER — HEPARIN SOD (PORK) LOCK FLUSH 100 UNIT/ML IV SOLN
500.0000 [IU] | Freq: Once | INTRAVENOUS | Status: AC | PRN
  Administered 2021-11-08: 500 [IU]

## 2021-11-08 MED ORDER — SODIUM CHLORIDE 0.9 % IV SOLN: Freq: Once | INTRAVENOUS | Status: AC

## 2021-11-08 MED ORDER — SODIUM CHLORIDE 0.9 % IV SOLN
10.0000 mg | Freq: Once | INTRAVENOUS | Status: AC
  Administered 2021-11-08: 10 mg via INTRAVENOUS
  Filled 2021-11-08: qty 1

## 2021-11-08 NOTE — Progress Notes (Signed)
Patient presents for treatment. RN assessment completed along with the following:  Labs/vitals reviewed - Yes, and within treatment parameters.   Weight within 10% of previous measurement - Yes Informed consent completed and reflects current therapy/intent - Yes, on date 07/06/21             Provider progress note reviewed - Patient not seen by provider today. Most recent note dated 11/01/21 reviewed. Treatment/Antibody/Supportive plan reviewed - Yes, and there are no adjustments needed for today's treatment. S&H and other orders reviewed - Yes, and there are no additional orders identified. Previous treatment date reviewed - Yes, and the appropriate amount of time has elapsed between treatments.   Patient to proceed with treatment.

## 2021-11-08 NOTE — Patient Instructions (Signed)
Dustin [name]   Discharge Instructions: Thank you for choosing [facility] to provide your oncology and hematology care.   If you have a lab appointment with the [facility], please go directly to the [facility] and check in at the registration area.   Wear comfortable clothing and clothing appropriate for easy access to any Portacath or PICC line.   We strive to give you quality time with your provider. You may need to reschedule your appointment if you arrive late (15 or more minutes).  Arriving late affects you and other patients whose appointments are after yours.  Also, if you miss three or more appointments without notifying the office, you may be dismissed from the clinic at the providers discretion.      For prescription refill requests, have your pharmacy contact our office and allow 72 hours for refills to be completed.    Today you received the following chemotherapy and/or immunotherapy agents Carboplatin (PARAPLATIN).      To help prevent nausea and vomiting after your treatment, we encourage you to take your nausea medication as directed.  BELOW ARE SYMPTOMS THAT SHOULD BE REPORTED IMMEDIATELY: *FEVER GREATER THAN 100.4 F (38 C) OR HIGHER *CHILLS OR SWEATING *NAUSEA AND VOMITING THAT IS NOT CONTROLLED WITH YOUR NAUSEA MEDICATION *UNUSUAL SHORTNESS OF BREATH *UNUSUAL BRUISING OR BLEEDING *URINARY PROBLEMS (pain or burning when urinating, or frequent urination) *BOWEL PROBLEMS (unusual diarrhea, constipation, pain near the anus) TENDERNESS IN MOUTH AND THROAT WITH OR WITHOUT PRESENCE OF ULCERS (sore throat, sores in mouth, or a toothache) UNUSUAL RASH, SWELLING OR PAIN  UNUSUAL VAGINAL DISCHARGE OR ITCHING   Items with * indicate a potential emergency and should be followed up as soon as possible or go to the Emergency Department if any problems should occur.  Please show the CHEMOTHERAPY ALERT CARD or IMMUNOTHERAPY ALERT CARD  at check-in to the Emergency Department and triage nurse.  Should you have questions after your visit or need to cancel or reschedule your appointment, please contact [facility]  Dept: [phone]  and follow the prompts.  Office hours are 8:00 a.m. to 4:30 p.m. Monday - Friday. Please note that voicemails left after 4:00 p.m. may not be returned until the following business day.  We are closed weekends and major holidays. You have access to a nurse at all times for urgent questions. Please call the main number to the clinic Dept: [phone] and follow the prompts.   For any non-urgent questions, you may also contact your provider using MyChart. We now offer e-Visits for anyone 33 and older to request care online for non-urgent symptoms. For details visit [email or web address].   Also download the MyChart app! Go to the app store, search "MyChart", open the app, select [facility], and log in with your MyChart username and password.  Due to Covid, a mask is required upon entering the hospital/clinic. If you do not have a mask, one will be given to you upon arrival. For doctor visits, patients may have 1 support person aged 29 or older with them. For treatment visits, patients cannot have anyone with them due to current Covid guidelines and our immunocompromised population.   Carboplatin injection What is this medication? CARBOPLATIN (KAR boe pla tin) is a chemotherapy drug. It targets fast dividing cells, like cancer cells, and causes these cells to die. This medicine is used to treat ovarian cancer and many other cancers. This medicine may be used for  other purposes; ask your health care provider or pharmacist if you have questions. COMMON BRAND NAME(S): Paraplatin What should I tell my care team before I take this medication? They need to know if you have any of these conditions: blood disorders hearing problems kidney disease recent or ongoing radiation  therapy an unusual or allergic reaction to carboplatin, cisplatin, other chemotherapy, other medicines, foods, dyes, or preservatives pregnant or trying to get pregnant breast-feeding How should I use this medication? This drug is usually given as an infusion into a vein. It is administered in a hospital or clinic by a specially trained health care professional. Talk to your pediatrician regarding the use of this medicine in children. Special care may be needed. Overdosage: If you think you have taken too much of this medicine contact a poison control center or emergency room at once. NOTE: This medicine is only for you. Do not share this medicine with others. What if I miss a dose? It is important not to miss a dose. Call your doctor or health care professional if you are unable to keep an appointment. What may interact with this medication? medicines for seizures medicines to increase blood counts like filgrastim, pegfilgrastim, sargramostim some antibiotics like amikacin, gentamicin, neomycin, streptomycin, tobramycin vaccines Talk to your doctor or health care professional before taking any of these medicines: acetaminophen aspirin ibuprofen ketoprofen naproxen This list may not describe all possible interactions. Give your health care provider a list of all the medicines, herbs, non-prescription drugs, or dietary supplements you use. Also tell them if you smoke, drink alcohol, or use illegal drugs. Some items may interact with your medicine. What should I watch for while using this medication? Your condition will be monitored carefully while you are receiving this medicine. You will need important blood work done while you are taking this medicine. This drug may make you feel generally unwell. This is not uncommon, as chemotherapy can affect healthy cells as well as cancer cells. Report any side effects. Continue your course of treatment even though you feel ill unless your doctor tells  you to stop. In some cases, you may be given additional medicines to help with side effects. Follow all directions for their use. Call your doctor or health care professional for advice if you get a fever, chills or sore throat, or other symptoms of a cold or flu. Do not treat yourself. This drug decreases your body's ability to fight infections. Try to avoid being around people who are sick. This medicine may increase your risk to bruise or bleed. Call your doctor or health care professional if you notice any unusual bleeding. Be careful brushing and flossing your teeth or using a toothpick because you may get an infection or bleed more easily. If you have any dental work done, tell your dentist you are receiving this medicine. Avoid taking products that contain aspirin, acetaminophen, ibuprofen, naproxen, or ketoprofen unless instructed by your doctor. These medicines may hide a fever. Do not become pregnant while taking this medicine. Women should inform their doctor if they wish to become pregnant or think they might be pregnant. There is a potential for serious side effects to an unborn child. Talk to your health care professional or pharmacist for more information. Do not breast-feed an infant while taking this medicine. What side effects may I notice from receiving this medication? Side effects that you should report to your doctor or health care professional as soon as possible: allergic reactions like skin rash, itching or  hives, swelling of the face, lips, or tongue signs of infection - fever or chills, cough, sore throat, pain or difficulty passing urine signs of decreased platelets or bleeding - bruising, pinpoint red spots on the skin, black, tarry stools, nosebleeds signs of decreased red blood cells - unusually weak or tired, fainting spells, lightheadedness breathing problems changes in hearing changes in vision chest pain high blood pressure low blood counts - This drug may  decrease the number of white blood cells, red blood cells and platelets. You may be at increased risk for infections and bleeding. nausea and vomiting pain, swelling, redness or irritation at the injection site pain, tingling, numbness in the hands or feet problems with balance, talking, walking trouble passing urine or change in the amount of urine Side effects that usually do not require medical attention (report to your doctor or health care professional if they continue or are bothersome): hair loss loss of appetite metallic taste in the mouth or changes in taste This list may not describe all possible side effects. Call your doctor for medical advice about side effects. You may report side effects to FDA at 1-800-FDA-1088. Where should I keep my medication? This drug is given in a hospital or clinic and will not be stored at home. NOTE: This sheet is a summary. It may not cover all possible information. If you have questions about this medicine, talk to your doctor, pharmacist, or health care provider.  2022 Elsevier/Gold Standard (2008-02-10 00:00:00)

## 2021-11-09 ENCOUNTER — Inpatient Hospital Stay: Payer: BC Managed Care – PPO | Admitting: Dietician

## 2021-11-09 ENCOUNTER — Ambulatory Visit
Admission: RE | Admit: 2021-11-09 | Discharge: 2021-11-09 | Disposition: A | Discharge status: Home / Self Care | Payer: BC Managed Care – PPO | Source: Ambulatory Visit | Attending: Radiation Oncology | Admitting: Radiation Oncology

## 2021-11-09 DIAGNOSIS — E119 Type 2 diabetes mellitus without complications: Secondary | ICD-10-CM | POA: Diagnosis not present

## 2021-11-09 DIAGNOSIS — C118 Malignant neoplasm of overlapping sites of nasopharynx: Secondary | ICD-10-CM | POA: Diagnosis not present

## 2021-11-09 DIAGNOSIS — Z5111 Encounter for antineoplastic chemotherapy: Secondary | ICD-10-CM | POA: Diagnosis not present

## 2021-11-09 DIAGNOSIS — J449 Chronic obstructive pulmonary disease, unspecified: Secondary | ICD-10-CM | POA: Diagnosis not present

## 2021-11-09 DIAGNOSIS — Z79899 Other long term (current) drug therapy: Secondary | ICD-10-CM | POA: Diagnosis not present

## 2021-11-09 DIAGNOSIS — F17210 Nicotine dependence, cigarettes, uncomplicated: Secondary | ICD-10-CM | POA: Diagnosis not present

## 2021-11-09 DIAGNOSIS — C119 Malignant neoplasm of nasopharynx, unspecified: Secondary | ICD-10-CM | POA: Diagnosis not present

## 2021-11-09 DIAGNOSIS — M069 Rheumatoid arthritis, unspecified: Secondary | ICD-10-CM | POA: Diagnosis not present

## 2021-11-09 DIAGNOSIS — E039 Hypothyroidism, unspecified: Secondary | ICD-10-CM | POA: Diagnosis not present

## 2021-11-09 NOTE — Progress Notes (Signed)
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   Met with patient after radiation treatment. He reports food does not taste good, but forcing himself to eat. Patient reports he is eating well and including good sources of protein. Every morning he eats biscuits/gravy, hash brown and eggs from [employer]. Patient eating mostly chunky soups and [place] stew when at home. He is drinking ~60 ounces of water, decaf iced tea, one cup of coffee and 1-2 Premier Protein. Patient denies sore throat, nausea, vomiting, diarrhea, constipation. Patient has thick saliva. He is using baking soda salt  water rinses 3 times/day.   Medications: reviewed  Labs: 2/23 - glucose 248  Anthropometrics: Weights continue to trend down. Last Aria wt 256 lb on 2/20 decreased 2.2% (5.8 lbs) in one week; significant  2/13 - 261.8 lb 2/6 - 264.2 lb  1/30 - 265.6 lb 1/23 - 271.2 lb   NUTRITION DIAGNOSIS: Inadequate oral intake continues     INTERVENTION:  Continue to educate on increased calories and protein needs to meet current metabolic demands and weight maintenance  Encouraged pt to switch to Ensure Plus High Protein for added calories, recommend drinking 3-4/day One complimentary case of Ensure Plus High Protein provided  Continue baking soda salt water rinses Suggested ginger ale rinse for thick saliva    MONITORING, EVALUATION, GOAL: weight trends, intake    NEXT VISIT: Friday March 3 after radiation

## 2021-11-10 ENCOUNTER — Other Ambulatory Visit: Payer: Self-pay | Admitting: Oncology

## 2021-11-12 ENCOUNTER — Ambulatory Visit
Admission: RE | Admit: 2021-11-12 | Discharge: 2021-11-12 | Disposition: A | Discharge status: Home / Self Care | Payer: BC Managed Care – PPO | Source: Ambulatory Visit | Attending: Radiation Oncology | Admitting: Radiation Oncology

## 2021-11-12 ENCOUNTER — Other Ambulatory Visit: Payer: Self-pay

## 2021-11-12 DIAGNOSIS — F17210 Nicotine dependence, cigarettes, uncomplicated: Secondary | ICD-10-CM | POA: Diagnosis not present

## 2021-11-12 DIAGNOSIS — M069 Rheumatoid arthritis, unspecified: Secondary | ICD-10-CM | POA: Diagnosis not present

## 2021-11-12 DIAGNOSIS — J449 Chronic obstructive pulmonary disease, unspecified: Secondary | ICD-10-CM | POA: Diagnosis not present

## 2021-11-12 DIAGNOSIS — Z79899 Other long term (current) drug therapy: Secondary | ICD-10-CM | POA: Diagnosis not present

## 2021-11-12 DIAGNOSIS — Z5111 Encounter for antineoplastic chemotherapy: Secondary | ICD-10-CM | POA: Diagnosis not present

## 2021-11-12 DIAGNOSIS — C118 Malignant neoplasm of overlapping sites of nasopharynx: Secondary | ICD-10-CM | POA: Diagnosis not present

## 2021-11-12 DIAGNOSIS — E039 Hypothyroidism, unspecified: Secondary | ICD-10-CM | POA: Diagnosis not present

## 2021-11-12 DIAGNOSIS — C111 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: Secondary | ICD-10-CM

## 2021-11-12 DIAGNOSIS — E119 Type 2 diabetes mellitus without complications: Secondary | ICD-10-CM | POA: Diagnosis not present

## 2021-11-12 DIAGNOSIS — C119 Malignant neoplasm of nasopharynx, unspecified: Secondary | ICD-10-CM | POA: Diagnosis not present

## 2021-11-12 MED ORDER — SONAFINE EX EMUL
1.0000 "application " | Freq: Two times a day (BID) | CUTANEOUS | Status: DC
  Administered 2021-11-12: 1 via TOPICAL

## 2021-11-13 ENCOUNTER — Ambulatory Visit
Admission: RE | Admit: 2021-11-13 | Discharge: 2021-11-13 | Disposition: A | Discharge status: Home / Self Care | Payer: BC Managed Care – PPO | Source: Ambulatory Visit | Attending: Radiation Oncology | Admitting: Radiation Oncology

## 2021-11-13 DIAGNOSIS — E039 Hypothyroidism, unspecified: Secondary | ICD-10-CM | POA: Diagnosis not present

## 2021-11-13 DIAGNOSIS — E119 Type 2 diabetes mellitus without complications: Secondary | ICD-10-CM | POA: Diagnosis not present

## 2021-11-13 DIAGNOSIS — F17210 Nicotine dependence, cigarettes, uncomplicated: Secondary | ICD-10-CM | POA: Diagnosis not present

## 2021-11-13 DIAGNOSIS — J449 Chronic obstructive pulmonary disease, unspecified: Secondary | ICD-10-CM | POA: Diagnosis not present

## 2021-11-13 DIAGNOSIS — C119 Malignant neoplasm of nasopharynx, unspecified: Secondary | ICD-10-CM | POA: Diagnosis not present

## 2021-11-13 DIAGNOSIS — Z5111 Encounter for antineoplastic chemotherapy: Secondary | ICD-10-CM | POA: Diagnosis not present

## 2021-11-13 DIAGNOSIS — M069 Rheumatoid arthritis, unspecified: Secondary | ICD-10-CM | POA: Diagnosis not present

## 2021-11-13 DIAGNOSIS — C118 Malignant neoplasm of overlapping sites of nasopharynx: Secondary | ICD-10-CM | POA: Diagnosis not present

## 2021-11-13 DIAGNOSIS — Z79899 Other long term (current) drug therapy: Secondary | ICD-10-CM | POA: Diagnosis not present

## 2021-11-14 ENCOUNTER — Other Ambulatory Visit: Payer: Self-pay

## 2021-11-14 ENCOUNTER — Ambulatory Visit
Admission: RE | Admit: 2021-11-14 | Discharge: 2021-11-14 | Disposition: A | Discharge status: Home / Self Care | Payer: BC Managed Care – PPO | Source: Ambulatory Visit | Attending: Radiation Oncology | Admitting: Radiation Oncology

## 2021-11-14 DIAGNOSIS — C111 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: Secondary | ICD-10-CM | POA: Insufficient documentation

## 2021-11-14 DIAGNOSIS — C119 Malignant neoplasm of nasopharynx, unspecified: Secondary | ICD-10-CM | POA: Diagnosis not present

## 2021-11-14 DIAGNOSIS — C118 Malignant neoplasm of overlapping sites of nasopharynx: Secondary | ICD-10-CM | POA: Diagnosis not present

## 2021-11-15 ENCOUNTER — Ambulatory Visit
Admission: RE | Admit: 2021-11-15 | Discharge: 2021-11-15 | Disposition: A | Discharge status: Home / Self Care | Payer: BC Managed Care – PPO | Source: Ambulatory Visit | Attending: Radiation Oncology | Admitting: Radiation Oncology

## 2021-11-15 DIAGNOSIS — C111 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: Secondary | ICD-10-CM | POA: Diagnosis not present

## 2021-11-15 DIAGNOSIS — C119 Malignant neoplasm of nasopharynx, unspecified: Secondary | ICD-10-CM | POA: Diagnosis not present

## 2021-11-15 DIAGNOSIS — C118 Malignant neoplasm of overlapping sites of nasopharynx: Secondary | ICD-10-CM | POA: Diagnosis not present

## 2021-11-16 ENCOUNTER — Inpatient Hospital Stay: Payer: BC Managed Care – PPO | Attending: Nurse Practitioner | Admitting: Dietician

## 2021-11-16 ENCOUNTER — Other Ambulatory Visit: Payer: Self-pay

## 2021-11-16 ENCOUNTER — Ambulatory Visit
Admission: RE | Admit: 2021-11-16 | Discharge: 2021-11-16 | Disposition: A | Discharge status: Home / Self Care | Payer: BC Managed Care – PPO | Source: Ambulatory Visit | Attending: Radiation Oncology | Admitting: Radiation Oncology

## 2021-11-16 DIAGNOSIS — C119 Malignant neoplasm of nasopharynx, unspecified: Secondary | ICD-10-CM | POA: Diagnosis not present

## 2021-11-16 DIAGNOSIS — C118 Malignant neoplasm of overlapping sites of nasopharynx: Secondary | ICD-10-CM | POA: Diagnosis not present

## 2021-11-16 DIAGNOSIS — C111 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: Secondary | ICD-10-CM | POA: Diagnosis not present

## 2021-11-16 NOTE — Progress Notes (Signed)
Nutrition Follow-up: ? 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 Patient is scheduled for final radiation therapy on 3/7.  ? ?Met with patient and wife in clinic. He reports continued altered taste, especially with meats. These taste horrible. Patient reports throwing out a Whopper after chewing the first bite which he spit out. Patient continues to eat biscuits with gravy and eggs every morning. This taste "somewhat normal." Patient eating mostly vegetable soup at home. He is drinking 2-3 Ensure Plus, decaf tea, water, and single cup of coffee in the morning. Patient reports severe dry mouth. He is constantly sipping on water. Patient tried eating a starburst, but spit this out due to taste. He does not like to chew gum. Patient is doing baking soda salt water rinses 2-3 times/day. Patient reports metallic taste the past couple of weeks. This has improved since completing chemotherapy. Patient denies nausea, vomiting, diarrhea, constipation.  ? ? ?Medications: reviewed ? ?Labs: 2/23 - glucose 248 ? ?Anthropometrics: Last Aria wt 258 lb on 2/27 increased ? ?2/20 - 256 lb  ?2/13 - 261.8 lb ?2/6 - 264.2 lb  ?1/30 - 265.6 lb ?1/23 - 271.2 lb ? ? ?NUTRITION DIAGNOSIS: Inadequate oral intake improving  ? ? ?INTERVENTION:  ?Encouraged high calorie high protein foods for weight maintenance  ?Continue drinking Ensure Plus, recommend 3/day  ?Continue baking soda salt water rinses  ?Reviewed strategies for dry mouth and altered taste - pt has handouts ?  ? ?MONITORING, EVALUATION, GOAL: weight trends, intake ? ? ?NEXT VISIT: Wednesday March 29  ? ? ? ?

## 2021-11-19 ENCOUNTER — Ambulatory Visit
Admission: RE | Admit: 2021-11-19 | Discharge: 2021-11-19 | Disposition: A | Discharge status: Home / Self Care | Payer: BC Managed Care – PPO | Source: Ambulatory Visit | Attending: Radiation Oncology | Admitting: Radiation Oncology

## 2021-11-19 ENCOUNTER — Other Ambulatory Visit: Payer: Self-pay

## 2021-11-19 DIAGNOSIS — C119 Malignant neoplasm of nasopharynx, unspecified: Secondary | ICD-10-CM | POA: Diagnosis not present

## 2021-11-19 DIAGNOSIS — C118 Malignant neoplasm of overlapping sites of nasopharynx: Secondary | ICD-10-CM | POA: Diagnosis not present

## 2021-11-19 DIAGNOSIS — C111 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: Secondary | ICD-10-CM

## 2021-11-19 MED ORDER — SONAFINE EX EMUL
1.0000 "application " | Freq: Two times a day (BID) | CUTANEOUS | Status: DC
  Administered 2021-11-19: 1 via TOPICAL

## 2021-11-20 ENCOUNTER — Encounter: Payer: Self-pay | Admitting: Radiation Oncology

## 2021-11-20 ENCOUNTER — Ambulatory Visit
Admission: RE | Admit: 2021-11-20 | Discharge: 2021-11-20 | Disposition: A | Discharge status: Home / Self Care | Payer: BC Managed Care – PPO | Source: Ambulatory Visit | Attending: Radiation Oncology | Admitting: Radiation Oncology

## 2021-11-20 DIAGNOSIS — C119 Malignant neoplasm of nasopharynx, unspecified: Secondary | ICD-10-CM | POA: Diagnosis not present

## 2021-11-20 DIAGNOSIS — C118 Malignant neoplasm of overlapping sites of nasopharynx: Secondary | ICD-10-CM | POA: Diagnosis not present

## 2021-11-20 DIAGNOSIS — C111 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: Secondary | ICD-10-CM | POA: Diagnosis not present

## 2021-11-20 NOTE — Progress Notes (Signed)
Oncology Nurse Navigator Documentation  ? ?Met with Dustin [name] and his wife after final RT to offer support and to celebrate end of radiation treatment.   ?Provided verbal/written post-RT guidance: ?Importance of keeping all follow-up appts, especially those with Nutrition and SLP. ?Importance of protecting treatment area from sun. ?Continuation of Sonafine application 2-3 times daily, application of antibiotic ointment to areas of raw skin; when supply of Sonafine exhausted transition to OTC lotion with vitamin E. ?Provided/reviewed Epic calendar of upcoming appts. ?Explained my role as navigator will continue for several more months, encouraged him to call me with needs/concerns.   ? ?Harlow Asa RN, BSN, OCN ?Head & Neck Oncology Nurse Navigator ?[place] at [facility] ?Phone # [phone]  ?Fax # [phone]   ?

## 2021-11-26 ENCOUNTER — Telehealth (HOSPITAL_COMMUNITY): Payer: Self-pay

## 2021-11-26 NOTE — Telephone Encounter (Signed)
I called to inform patient of Dr. Kathrynn Speed Medical Leave of Absence until 02-11-22 and the need to reschedule his appt on 12-21-21. Patient stated that he is going back to work the first of June and wanted to wait to reschedule this appt after he goes back to work. I explained to patient that was ok and he would need to give me a call back to do so at that time. Patient stated that his mouth was very dry and I gave him the recommendation for Closys to try. I also explained that if he had questions or concerns he could call me anytime. Patient expressed understanding and his appt for 12-21-21 was cancelled.  ?

## 2021-12-04 ENCOUNTER — Ambulatory Visit: Payer: Self-pay | Admitting: Physical Therapy

## 2021-12-05 ENCOUNTER — Ambulatory Visit: Payer: BC Managed Care – PPO

## 2021-12-05 NOTE — Progress Notes (Signed)
Oncology Nurse Navigator Documentation  ? ?Dustin [name] called yesterday to cancel his appointment with Dustin [name] SLP. He reported that he was swallowing well and didn't need to see him. Dustin [name] was made aware and Dustin [name] was asked by her to call him and encourage him to reschedule his appointment. Dustin [name] explained that often swallowing problems can develop months after completing treatment and encouraged him to see Dustin [name] but at the very least to continue doing the exercises Dustin [name] recommended. He said he would do the exercises but continued to decline to see Dustin [name]. Dustin [name] have notified Dustin [name] and Dustin [name] of the phone call with Dustin [name]. He will be here next week to see Dustin [name] for follow up.  ? ?Dustin Asa RN, BSN, OCN ?Head & Neck Oncology Nurse Navigator ?[place] at [facility] ?Phone # [phone]  ?Fax # [phone]  ? ?

## 2021-12-10 DIAGNOSIS — Z6833 Body mass index (BMI) 33.0-33.9, adult: Secondary | ICD-10-CM | POA: Diagnosis not present

## 2021-12-10 DIAGNOSIS — I10 Essential (primary) hypertension: Secondary | ICD-10-CM | POA: Diagnosis not present

## 2021-12-10 DIAGNOSIS — Z1331 Encounter for screening for depression: Secondary | ICD-10-CM | POA: Diagnosis not present

## 2021-12-10 DIAGNOSIS — G4700 Insomnia, unspecified: Secondary | ICD-10-CM | POA: Diagnosis not present

## 2021-12-10 DIAGNOSIS — E063 Autoimmune thyroiditis: Secondary | ICD-10-CM | POA: Diagnosis not present

## 2021-12-10 DIAGNOSIS — E119 Type 2 diabetes mellitus without complications: Secondary | ICD-10-CM | POA: Diagnosis not present

## 2021-12-10 DIAGNOSIS — E6609 Other obesity due to excess calories: Secondary | ICD-10-CM | POA: Diagnosis not present

## 2021-12-10 DIAGNOSIS — M1991 Primary osteoarthritis, unspecified site: Secondary | ICD-10-CM | POA: Diagnosis not present

## 2021-12-10 DIAGNOSIS — F411 Generalized anxiety disorder: Secondary | ICD-10-CM | POA: Diagnosis not present

## 2021-12-12 ENCOUNTER — Inpatient Hospital Stay: Payer: BC Managed Care – PPO | Admitting: Dietician

## 2021-12-12 ENCOUNTER — Other Ambulatory Visit: Payer: Self-pay

## 2021-12-12 ENCOUNTER — Ambulatory Visit
Admission: RE | Admit: 2021-12-12 | Discharge: 2021-12-12 | Disposition: A | Discharge status: Home / Self Care | Payer: BC Managed Care – PPO | Source: Ambulatory Visit | Attending: Radiation Oncology | Admitting: Radiation Oncology

## 2021-12-12 VITALS — BP 131/85 | HR 102 | Temp 96.3°F | Resp 18 | Ht 73.0 in | Wt 250.1 lb

## 2021-12-12 DIAGNOSIS — C119 Malignant neoplasm of nasopharynx, unspecified: Secondary | ICD-10-CM

## 2021-12-12 DIAGNOSIS — C111 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: Secondary | ICD-10-CM

## 2021-12-12 NOTE — Progress Notes (Addendum)
?Radiation Oncology         [phone] ?________________________________ ? ?Name: Dustin [name] MRN: [id number]  ?Date: 12/12/2021  DOB: 02-27-1961 ? ?Follow-Up Visit Note ? ?CC: Redmond School, MD  Ladell Pier, MD ? ?Diagnosis and Prior Radiotherapy:     ?  ICD-10-CM   ?1. 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  ?2. 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[place])  C11.9   ?  ? ? ? Cancer Staging  ?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[place]) ?Staging form: Pharynx - Nasopharynx, AJCC 8th Edition ?- Clinical: Stage III (cT3, cN2, cM0) - Signed by Ladell Pier, MD on 06/12/2021 ? ? ?Radiation Treatment Dates: 10/03/2021 through 11/20/2021 ?Site Technique Total Dose (Gy) Dose per Fx (Gy) Completed Fx Beam Energies  ?Nasopharynx: HN_NasoP IMRT 70/70 2 35/35 6X  ? ?CHIEF COMPLAINT:  Here for follow-up and surveillance of nasopharyngeal cancer ? ?Narrative:  The patient returns today for routine follow-up.  He is feeling really good.  He is pleased with his progress.  He is able to do some yard work and work around [employer] but plans to continue to recuperate and recover before returning to work, anticipated return in early June.  His main complaint is that he still has a dry mouth.  He still has some taste changes.  His skin has healed well.  He has avoided a feeding tube.  Denies any ear or jaw pain.  Denies trismus.  Still has some fatigue.    Pain in his throat has improved significantly.             ? ?ALLERGIES:  has No Known Allergies. ? ?Meds: ?Current Outpatient Medications  ?Medication Sig Dispense Refill  ? ALPRAZolam (XANAX) 1 MG tablet Take 1 mg by mouth 3 (three) times daily as needed for anxiety.    ? atorvastatin (LIPITOR) 10 MG tablet Take 10 mg by mouth at bedtime.    ? Blood Glucose Monitoring Suppl ([place] FLEX SYSTEM) w/Device KIT daily. as directed    ? buPROPion (WELLBUTRIN SR) 150 MG 12 hr tablet Start one week before quit date. Take 1 tab daily x 3 days, then 1 tab BID  thereafter. (Patient taking differently: daily. Start one week before quit date. Take 1 tab daily x 3 days, then 1 tab BID thereafter.) 60 tablet 2  ? Cholecalciferol (DIALYVITE VITAMIN D 5000 PO) Take 1 capsule by mouth daily.    ? levothyroxine (SYNTHROID) 50 MCG tablet Take 50 mcg by mouth at bedtime.    ? lidocaine (XYLOCAINE) 2 % solution Patient: Mix 1part 2% viscous lidocaine, 1part H20. Swish &/or swallow 20mL of diluted mixture, 28min before meals and at bedtime, up to QID 200 mL 3  ? Lidocaine-Aloe Vera (BURN RELIEF/LIDOCAINE/ALOE) 0.5 % GEL Apply to neck as needed for pain/burning sensation. 226 g 2  ? lidocaine-prilocaine (EMLA) cream Apply 1 application topically as needed. 30 g 0  ? lisinopril (ZESTRIL) 2.5 MG tablet Take by mouth.    ? magic mouthwash SOLN Take 5 mLs by mouth 4 (four) times daily as needed for mouth pain. (Patient not taking: Reported on 10/18/2021) 240 mL 0  ? metFORMIN (GLUCOPHAGE) 500 MG tablet Take 500 mg by mouth 2 (two) times daily with a meal.    ? methotrexate (RHEUMATREX) 2.5 MG tablet TAKE EIGHT TABLETS BY MOUTH ONCE WEEKLY (Patient not taking: Reported on 06/20/2021) 96 tablet 0  ? nystatin (MYCOSTATIN) 100000 UNIT/ML suspension Take 10 mLs by  mouth in the morning, at noon, in the evening, and at bedtime. (Patient not taking: Reported on 10/18/2021)    ? ondansetron (ZOFRAN) 8 MG tablet TAKE 1 TAB BY MOUTH EVERY 8 HOURS AS NEEDED FOR NAUSEA OR VOMITING. START [place] (Patient not taking: Reported on 10/18/2021) 24 tablet 2  ? ONETOUCH VERIO test strip daily.    ? oxyCODONE (OXY IR/ROXICODONE) 5 MG immediate release tablet Take 1-1.5 tablets (5-7.5 mg total) by mouth every 8 (eight) hours as needed for severe pain. (Patient not taking: Reported on 10/18/2021) 30 tablet 0  ? pantoprazole (PROTONIX) 40 MG tablet Take 40 mg by mouth at bedtime.    ? prochlorperazine (COMPAZINE) 10 MG tablet TAKE 1 TABLET BY MOUTH EVERY 6 HOURS AS NEEDED FOR NAUSEA OR  VOMITING. 30 tablet 3  ? sildenafil (VIAGRA) 25 MG tablet Take 25 mg by mouth daily as needed for erectile dysfunction.    ? zolpidem (AMBIEN) 10 MG tablet Take 10 mg by mouth at bedtime as needed for sleep. Reported on 01/20/2016    ? ?No current facility-administered medications for this encounter.  ? ? ?Physical Findings: ?The patient is in no acute distress. Patient is alert and oriented. ?Wt Readings from Last 3 Encounters:  ?12/12/21 250 lb 2 oz (113.5 kg)  ?11/08/21 258 lb 6.4 oz (117.2 kg)  ?11/01/21 260 lb 6.4 oz (118.1 kg)  ? ? height is _0  (1.854 m) and weight is 250 lb 2 oz (113.5 kg). His temporal temperature is 96.3 ?F (35.7 ?C) (abnormal). His blood pressure is 131/85 and his pulse is 102 (abnormal). His respiration is 18 and oxygen saturation is 95%. Marland Kitchen  ?General: Alert and oriented, in no acute distress ?HEENT: Head is normocephalic. Extraocular movements are intact. Oropharynx is notable for no lesions ?Neck: Neck is notable for no palpable masses.  Skin is smooth.  Mild lymphedema anteriorly. ?Skin: Skin in treatment fields shows satisfactory healing throughout the neck ?Abdomen: Soft, nontender, nondistended, with no rigidity or guarding. ?Lymphatics: see Neck Exam ?Psychiatric: Judgment and insight are intact. Affect is appropriate. ? ? ?Lab Findings: ?Lab Results  ?Component Value Date  ? WBC 4.5 11/08/2021  ? HGB 12.5 (L) 11/08/2021  ? HCT 37.3 (L) 11/08/2021  ? MCV 97.1 11/08/2021  ? PLT 172 11/08/2021  ? ? ?Lab Results  ?Component Value Date  ? TSH 2.866 09/24/2021  ? ? ?Radiographic Findings: ?No results found. ? ?Impression/Plan:   ? ?1) Head and Neck Cancer Status: Healing very well from radiation ? ?2) Nutritional Status: No active issues ?PEG tube: None ? ?3) Risk Factors: The patient has been educated about risk factors including alcohol and tobacco abuse; they understand that avoidance of alcohol and tobacco is important to prevent recurrences as well as other cancers ? ?4)  Swallowing: Good function but he is at risk for dysphagia later on.  He stopped this following therapy against medical advice.  I will refer him back to  SLP (after discussion with him about the risks of stopping this he understands that it is important to prophylax against dysphagia) ? ?5) Dental: Encouraged to continue regular followup with dentistry, and dental hygiene including fluoride rinses.  Advised that he see his community dentist every 4 months for cleanings as he is at risk for cavities given his dry mouth.  Regarding his dry mouth I advised him to try Biotene products, continue to have sips of water through the day, and  also consider trying XyliMelts.  He knows that his dry mouth may improve over the next several months. ? ?6) Thyroid function: Check annually and medical oncology ?Lab Results  ?Component Value Date  ? TSH 2.866 09/24/2021  ? ? ?7) Other: He has some mild lymphedema in his neck.  I recommend that he see physical therapy.  We will refer back to them. ? ?8) Follow-up in early June with restaging PET scan. The patient was encouraged to call with any issues or questions before then. ? ?On date of service, in total, I spent 30 minutes on this encounter. Patient was seen in person. ?_____________________________________ ? ? ?Eppie Gibson, MD  ?

## 2021-12-12 NOTE — Progress Notes (Signed)
Dustin [name] presents today for follow-up after completing radiation to his nasopharynx on 11/20/2021 ? ?Pain issues, if any: Patient denies ?Using a feeding tube?: N/A ?Weight changes, if any:  ?Wt Readings from Last 3 Encounters:  ?12/12/21 250 lb 2 oz (113.5 kg)  ?11/08/21 258 lb 6.4 oz (117.2 kg)  ?11/01/21 260 lb 6.4 oz (118.1 kg)  ? ?Swallowing issues, if any: Patient denies ?Smoking or chewing tobacco? None ?Using fluoride trays daily? N/A--denies any new mouth sores or dental concerns  ?Last ENT visit was on: Not since diagnosis ?Other notable issues, if any: Scheduled for F/U with medical oncologist Dr. Benay Spice on 12/14/21. Continues to deal with fatigue, dry mouth, and altered sense of taste. Skin in treatment field appears intact and well healed. Denies any ear or jaw pain, or difficulty opening his mouth. Other than the fatigue he reports he feels well and is pleased with his continued progress ? ? ? ? ?

## 2021-12-12 NOTE — Progress Notes (Signed)
Nutrition Follow-up: ? ?Patient has completed chemoradiation for nasopharyngeal cancer.  ? ?Met with patient in office. He reports good appetite and eating more now that taste is improving. Patient does not like the taste of bread or cheese. He eats a lot of soups (chicken noodle, veg beef, chicken rice, sirloin), eggs. Last night he ate fajitas without the tortilla. Patient reports peppers and onions tasted good. He continues to have dry mouth. This is frustrating. Patient is drinking a lot of water and some unsweetened tea. Patient reports increased activity since completing radiation therapy. He recalls push mowing his yard, organizing the garage, house hold chores. Patient tires quickly, but continues on after taking a break. Patient is looking forward to going back to work in June.  ? ?Medications: reviewed  ? ?Labs: no new labs for review  ? ?Anthropometrics: Weight 250.1 lb today decreased 3% in the last 4 weeks ? ?2/27 - 258 lb  ?2/20 - 256 lb  ?2/13 - 261.8 lb ?2/6 - 264.2 lb  ?1/30 - 265.6 lb ?1/23 - 271.2 lb ? ? ?NUTRITION DIAGNOSIS: Inadequate oral intake improving  ? ? ?INTERVENTION:  ?Continue eating high calorie, high protein foods  ?Continue drinking 1-2 Ensure Plus/equivalent  ?One complimentary case of Ensure Plus High Protein provided today  ?  ? ?MONITORING, EVALUATION, GOAL: weight trends, intake ? ? ?NEXT VISIT: Friday June 9 for wt check  ? ? ?

## 2021-12-13 ENCOUNTER — Ambulatory Visit: Payer: BC Managed Care – PPO | Admitting: Oncology

## 2021-12-13 ENCOUNTER — Other Ambulatory Visit: Payer: Self-pay

## 2021-12-13 DIAGNOSIS — C119 Malignant neoplasm of nasopharynx, unspecified: Secondary | ICD-10-CM

## 2021-12-14 ENCOUNTER — Inpatient Hospital Stay: Payer: BC Managed Care – PPO

## 2021-12-14 ENCOUNTER — Inpatient Hospital Stay: Payer: BC Managed Care – PPO | Admitting: Oncology

## 2021-12-14 VITALS — BP 111/71 | HR 94 | Temp 97.8°F | Resp 20 | Ht 73.0 in | Wt 247.6 lb

## 2021-12-14 DIAGNOSIS — J449 Chronic obstructive pulmonary disease, unspecified: Secondary | ICD-10-CM | POA: Insufficient documentation

## 2021-12-14 DIAGNOSIS — C119 Malignant neoplasm of nasopharynx, unspecified: Secondary | ICD-10-CM | POA: Diagnosis not present

## 2021-12-14 DIAGNOSIS — M069 Rheumatoid arthritis, unspecified: Secondary | ICD-10-CM | POA: Diagnosis not present

## 2021-12-14 DIAGNOSIS — Z923 Personal history of irradiation: Secondary | ICD-10-CM | POA: Diagnosis not present

## 2021-12-14 DIAGNOSIS — E119 Type 2 diabetes mellitus without complications: Secondary | ICD-10-CM | POA: Insufficient documentation

## 2021-12-14 DIAGNOSIS — E039 Hypothyroidism, unspecified: Secondary | ICD-10-CM | POA: Insufficient documentation

## 2021-12-14 DIAGNOSIS — Z79899 Other long term (current) drug therapy: Secondary | ICD-10-CM | POA: Diagnosis not present

## 2021-12-14 DIAGNOSIS — R591 Generalized enlarged lymph nodes: Secondary | ICD-10-CM

## 2021-12-14 NOTE — Progress Notes (Signed)
?  [place] ?OFFICE PROGRESS NOTE ? ? ?Diagnosis: Nasopharynx carcinoma ? ?INTERVAL HISTORY:  ? ?Dustin [name] returns as scheduled.  He completed radiation 11/20/2021.  Throat soreness has resolved.  No headache.  Mild malaise.  No other complaint. ? ?Objective: ? ?Vital signs in last 24 hours: ? ?Blood pressure 111/71, pulse 94, temperature 97.8 ?F (36.6 ?C), temperature source Oral, resp. rate 20, height '6\' 1"'$  (1.854 m), weight 247 lb 9.6 oz (112.3 kg), SpO2 98 %. ?  ? ?HEENT: Oropharynx without visible mass.  No ulcers. ?Lymphatics: No cervical, supraclavicular, or axillary nodes ?Resp: Lungs clear bilaterally ?Cardio: Regular rate and rhythm ?GI: No hepatosplenomegaly ?Vascular: No leg edema ? ?Portacath/PICC-without erythema ? ?Lab Results: ? ?Lab Results  ?Component Value Date  ? WBC 4.5 11/08/2021  ? HGB 12.5 (L) 11/08/2021  ? HCT 37.3 (L) 11/08/2021  ? MCV 97.1 11/08/2021  ? PLT 172 11/08/2021  ? NEUTROABS 3.5 11/08/2021  ? ? ?CMP  ?Lab Results  ?Component Value Date  ? NA 136 11/08/2021  ? K 4.0 11/08/2021  ? CL 100 11/08/2021  ? CO2 28 11/08/2021  ? GLUCOSE 248 (H) 11/08/2021  ? BUN 16 11/08/2021  ? CREATININE 0.87 11/08/2021  ? CALCIUM 9.3 11/08/2021  ? PROT 7.2 11/08/2021  ? ALBUMIN 4.0 11/08/2021  ? AST 20 11/08/2021  ? ALT 25 11/08/2021  ? ALKPHOS 48 11/08/2021  ? BILITOT 1.0 11/08/2021  ? GFRNONAA >60 11/08/2021  ? GFRAA 108 09/29/2020  ? ? ?Medications: I have reviewed the patient's current medications. ? ? ?Assessment/Plan: ?Nasopharyngeal cancer, presenting with left neck adenopathy ?MRI cervical spine 04/27/2021-bilateral enlarged upper jugular chain lymph nodes.  Right level 2 node measures 18 mm ?CT neck 05/25/2021-bilateral nasopharyngeal mass eroding the skull base at the level of the clivus and inferior sphenoid sinus, enlarged right jugular digastric node into enlarged left level 2 nodes, mild soft tissue density in the inferior left sphenoid sinus-tumor? 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 ?MRI face 06/19/2021-extensive tumor invasion of the skull base, no trigeminal or other cranial nerve involvement, abnormal bilateral level 2 nodes ?PET 06/19/2021-decrease size of nasopharyngeal mass compared to 05/25/2021 CT, invasion of the clivus, single bilateral level 2A lymph nodes mildly enlarged and hypermetabolic, no evidence of distant metastatic disease ?Cycle 1 gemcitabine/carboplatin 07/06/2021, day 8 gemcitabine 07/05/2021 ?Cycle 2 gemcitabine/carboplatin 07/27/2021 ?Cycle 3 gemcitabine/carboplatin 08/17/2021 ?PET scan 09/14/2021-decrease in size and degree of FDG uptake associated with FDG avid posterior nasopharyngeal lesion.  Residual FDG uptake along the midline of the posterior nasopharynx.  Persistent but improved asymmetric increased uptake within the right tonsillar region.  Resolution of previous FDG avid cervical lymph nodes. 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 ? ? ?Disposition: ?Dustin [name] is in clinical remission from nasopharynx carcinoma.  He is scheduled for a restaging PET scan in June.  He will keep the Port-A-Cath in place until after the PET scan. ?He plans to resume methotrexate for rheumatoid arthritis.  He reports having recent lab studies via his primary provider.  He will contact his primary provider to be sure the lab results are adequate to resume methotrexate. ? ?He will return for an office visit after the restaging PET scan. ? ?[name], MD ? ?12/14/2021  ?11:09 AM ? ? ?

## 2021-12-17 ENCOUNTER — Encounter: Payer: Self-pay | Admitting: *Deleted

## 2021-12-17 NOTE — Progress Notes (Signed)
Patient brought in 1 page form requesting completion today to allow him to return to work on 02/14/22. MD agreed with return date and signed form after completed by RN. Given to patient and copy to be scanned. Encouraged him to provide more time in future, since policy is 0-34 business days. ? ?

## 2021-12-18 ENCOUNTER — Encounter: Payer: Self-pay | Admitting: *Deleted

## 2021-12-20 ENCOUNTER — Other Ambulatory Visit: Payer: Self-pay | Admitting: *Deleted

## 2021-12-20 ENCOUNTER — Encounter: Payer: Self-pay | Admitting: *Deleted

## 2021-12-20 NOTE — Progress Notes (Signed)
Oxycodone d/c'd from med list. Patient reports he is no longer taking this. ?

## 2021-12-20 NOTE — Progress Notes (Signed)
Completed and MD signed Fit for Duty form for patient to return to work on 02/14/22. He reports he no longer takes narcotic pain medication. Mailed to his home as requested and copy to be scanned. ?

## 2021-12-21 ENCOUNTER — Other Ambulatory Visit (HOSPITAL_COMMUNITY): Payer: BC Managed Care – PPO | Admitting: Dentistry

## 2021-12-24 NOTE — Progress Notes (Signed)
? ?Office Visit Note ? ?Patient: Dustin [name]             ?Date of Birth: 03-11-1961           ?MRN: [id number]             ?PCP: Redmond School, MD ?Referring: Redmond School, MD ?Visit Date: 12/25/2021 ?Occupation: '@GUAROCC'$ @ ? ?Subjective:  ?Pain and stiffness in joints ? ?History of Present Illness: Dustin [name] is a 61 y.o. male with history of rheumatoid arthritis and osteoarthritis.  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  He had CTX x 9 weeks, 01/23 started RTX x 7weeks, CTX x 6 wks. Completed RTX 11/20/2021. He will have repeat PETscan in June 2023.  He states he did not have any side effects from chemotherapy.  Although from radiation therapy he lost the sense for taste and smell.  He also had some localized skin sensitivity from the radiation therapy.  The symptoms are gradually getting better.  In the last few weeks he has been experiencing increased pain in his joints especially in his shoulders and hips.  He has not seen any joint swelling it.  He has clearance from his oncologist to restart methotrexate. ? ?Activities of Daily Living:  ?Patient reports morning stiffness for a few  minutes.   ?Patient Denies nocturnal pain.  ?Difficulty dressing/grooming: Denies ?Difficulty climbing stairs: Denies ?Difficulty getting out of chair: Denies ?Difficulty using hands for taps, buttons, cutlery, and/or writing: Denies ? ?Review of Systems  ?Constitutional:  Positive for fatigue.  ?HENT:  Positive for mouth dryness. Negative for mouth sores and nose dryness.   ?Eyes:  Negative for pain, itching and dryness.  ?Respiratory:  Positive for shortness of breath. Negative for difficulty breathing.   ?Cardiovascular:  Negative for chest pain and palpitations.  ?Gastrointestinal:  Negative for blood in stool, constipation and diarrhea.  ?Endocrine: Negative for increased urination.  ?Genitourinary:  Negative for difficulty urinating.  ?Musculoskeletal:  Positive for joint pain, joint pain  and morning stiffness. Negative for joint swelling, myalgias, muscle tenderness and myalgias.  ?Skin:  Negative for color change, rash and redness.  ?Allergic/Immunologic: Negative for susceptible to infections.  ?Neurological:  Positive for numbness. Negative for dizziness, headaches and memory loss.  ?Hematological:  Positive for bruising/bleeding tendency.  ?Psychiatric/Behavioral:  Negative for confusion.   ? ?PMFS History:  ?Patient Active Problem List  ? Diagnosis Date Noted  ? 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Dustin [name]) 06/12/2021  ? Chest pain of uncertain etiology   ? Former smoker 04/18/2017  ? 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 History of positive PPD treated with INH  04/14/2017  ? Abscess of left olecranon bursa 11/07/2016  ? High risk medication use 11/07/2016  ? Cervical disc disease 11/07/2016  ? Primary osteoarthritis of both hands 11/07/2016  ? Primary osteoarthritis of both knees 11/07/2016  ? Primary osteoarthritis of both shoulders 11/07/2016  ? Precordial pain 01/22/2016  ? Pain in the chest   ? Hyperlipidemia 01/20/2016  ? Depression with anxiety 01/20/2016  ? GERD (gastroesophageal reflux disease) 01/20/2016  ? Type 2 diabetes mellitus (Dustin [name]) 01/20/2016  ? Chest pain at rest 09/22/2014  ? Tobacco use 09/22/2014  ? Morbid obesity (Dustin [name]) 09/22/2014  ? Rheumatoid arthritis (Dustin [name]) 09/22/2014  ? Family history of heart disease 09/22/2014  ? Chest pain 09/22/2014  ?  ?Past Medical History:  ?Diagnosis Date  ? Anxiety   ?  COPD (chronic obstructive pulmonary disease) ([place])   ? Depression   ? Essential hypertension   ? GERD (gastroesophageal reflux disease)   ? H/O hiatal hernia   ? History of cardiac catheterization   ? Normal coronaries May 2017  ? History of ITP   ? Childhood  ? Hypothyroidism   ? Osteoarthritis   ? Type 2 diabetes mellitus (Dustin [name])   ? no meds  ?  ?Family History  ?Problem Relation Age of  Onset  ? Heart disease Mother   ? COPD Mother   ? Heart attack Father   ?     Sudden death  ? Heart attack Sister 25  ? Kidney disease Sister   ? Heart disease Sister   ? Heart attack Brother   ? Heart disease Brother   ? Diabetes Brother   ? Heart attack Brother   ? Heart disease Brother 56  ?     CABG  ? Heart attack Brother 32  ? ?Past Surgical History:  ?Procedure Laterality Date  ? ANTERIOR CERVICAL DECOMP/DISCECTOMY FUSION  01/21/2012  ? Procedure: ANTERIOR CERVICAL DECOMPRESSION/DISCECTOMY FUSION 2 LEVELS;  Surgeon: Erline Levine, MD;  Location: [facility];  Service: Neurosurgery;  Laterality: N/A;  Cervical Six-Seven, Cervical Seven-Thoracic One, Anterior Cervical Decompression with Fusion Interbody Prothesis Plating and Bonegraft possible posterior Cervical Seven-Thoracic One Foraminotomy  ? CARDIAC CATHETERIZATION  [phone]  ? normal coronary arteries  ? CARDIAC CATHETERIZATION N/A 01/22/2016  ? Procedure: Left Heart Cath and Coronary Angiography;  Surgeon: Burnell Blanks, MD;  Location: [facility];  Service: Cardiovascular;  Laterality: N/A;  ? CARPAL TUNNEL RELEASE Bilateral   ? CERVICAL DISC SURGERY  04  ? CHOLECYSTECTOMY N/A 10/26/2014  ? Procedure: LAPAROSCOPIC CHOLECYSTECTOMY;  Surgeon: Jamesetta So, MD;  [facility]: [facility];  Service: General;  Laterality: N/A;  ? ESOPHAGOGASTRODUODENOSCOPY N/A 05/03/2016  ? Procedure: ESOPHAGOGASTRODUODENOSCOPY (EGD);  Surgeon: Rogene Houston, MD;  [facility];  Service: Endoscopy;  Laterality: N/A;  10:30  ? HERNIA REPAIR Right 60's  ? IR IMAGING GUIDED PORT INSERTION  06/27/2021  ? Left elbow bursa removed    ? NASOPHARYNGOSCOPY N/A 06/04/2021  ? Procedure: NASAL ENDOSCOPY WITH BIOPSY OF NASOPHARYNX; FROZEN SECTION;  Surgeon: Izora Gala, MD;  Location: [facility];  Service: ENT;  Laterality: N/A;  ? TONSILLECTOMY  72  ? ?Social History  ? ?Social History Narrative  ? Not on file  ? ?Immunization History  ?Administered Date(s) Administered   ? Moderna Covid-19 Vaccine Bivalent Booster 18yr & up 06/20/2021  ? Moderna Sars-Covid-2 Vaccination 12/13/2019, 01/03/2020  ?  ? ?Objective: ?Vital Signs: BP 115/81 (BP Location: Left Arm, Patient Position: Sitting, Cuff Size: Large)   Pulse (!) 109   Ht '6\' 1"'$  (1.854 m)   Wt 240 lb 12.8 oz (109.2 kg)   BMI 31.77 kg/m?   ? ?Physical Exam ?Vitals and nursing note reviewed.  ?Constitutional:   ?   Appearance: He is well-developed.  ?HENT:  ?   Head: Normocephalic and atraumatic.  ?Eyes:  ?   Conjunctiva/sclera: Conjunctivae normal.  ?   Pupils: Pupils are equal, round, and reactive to light.  ?Cardiovascular:  ?   Rate and Rhythm: Normal rate and regular rhythm.  ?   Heart sounds: Normal heart sounds.  ?Pulmonary:  ?   Effort: Pulmonary effort is normal.  ?   Breath sounds: Normal breath sounds.  ?Abdominal:  ?   General: Bowel sounds are normal.  ?  Palpations: Abdomen is soft.  ?Musculoskeletal:  ?   Cervical back: Normal range of motion and neck supple.  ?Skin: ?   General: Skin is warm and dry.  ?   Capillary Refill: Capillary refill takes less than 2 seconds.  ?Neurological:  ?   Mental Status: He is alert and oriented to person, place, and time.  ?Psychiatric:     ?   Behavior: Behavior normal.  ?  ? ?Musculoskeletal Exam: C-spine was in good range of motion.  Shoulder joints, elbow joints, wrist joints, MCPs PIPs and DIPs with good range of motion with no synovitis.  Hip joints and knee joints with good range of motion.  He had no tenderness on palpation of ankles or MTPs. ? ?CDAI Exam: ?CDAI Score: 0.6  ?Patient Global: 3 mm; Provider Global: 3 mm ?Swollen: 0 ; Tender: 0  ?Joint Exam 12/25/2021  ? ?No joint exam has been documented for this visit  ? ?There is currently no information documented on the homunculus. Go to the Rheumatology activity and complete the homunculus joint exam. ? ?Investigation: ?No additional findings. ? ?Imaging: ?No results found. ? ?Recent Labs: ?Lab Results  ?Component Value  Date  ? WBC 4.5 11/08/2021  ? HGB 12.5 (L) 11/08/2021  ? PLT 172 11/08/2021  ? NA 136 11/08/2021  ? K 4.0 11/08/2021  ? CL 100 11/08/2021  ? CO2 28 11/08/2021  ? GLUCOSE 248 (H) 11/08/2021  ? BUN 16 02/23/

## 2021-12-25 ENCOUNTER — Encounter: Payer: Self-pay | Admitting: Rheumatology

## 2021-12-25 ENCOUNTER — Ambulatory Visit (INDEPENDENT_AMBULATORY_CARE_PROVIDER_SITE_OTHER): Payer: BC Managed Care – PPO | Admitting: Rheumatology

## 2021-12-25 VITALS — BP 115/81 | HR 109 | Ht 73.0 in | Wt 240.8 lb

## 2021-12-25 DIAGNOSIS — Z8659 Personal history of other mental and behavioral disorders: Secondary | ICD-10-CM

## 2021-12-25 DIAGNOSIS — F5101 Primary insomnia: Secondary | ICD-10-CM

## 2021-12-25 DIAGNOSIS — M0630 Rheumatoid nodule, unspecified site: Secondary | ICD-10-CM | POA: Diagnosis not present

## 2021-12-25 DIAGNOSIS — Z8639 Personal history of other endocrine, nutritional and metabolic disease: Secondary | ICD-10-CM

## 2021-12-25 DIAGNOSIS — M19042 Primary osteoarthritis, left hand: Secondary | ICD-10-CM

## 2021-12-25 DIAGNOSIS — Z9289 Personal history of other medical treatment: Secondary | ICD-10-CM

## 2021-12-25 DIAGNOSIS — Z8719 Personal history of other diseases of the digestive system: Secondary | ICD-10-CM

## 2021-12-25 DIAGNOSIS — C119 Malignant neoplasm of nasopharynx, unspecified: Secondary | ICD-10-CM

## 2021-12-25 DIAGNOSIS — M19011 Primary osteoarthritis, right shoulder: Secondary | ICD-10-CM

## 2021-12-25 DIAGNOSIS — M19041 Primary osteoarthritis, right hand: Secondary | ICD-10-CM

## 2021-12-25 DIAGNOSIS — Z87891 Personal history of nicotine dependence: Secondary | ICD-10-CM

## 2021-12-25 DIAGNOSIS — M5090 Cervical disc disorder, unspecified, unspecified cervical region: Secondary | ICD-10-CM

## 2021-12-25 DIAGNOSIS — M19012 Primary osteoarthritis, left shoulder: Secondary | ICD-10-CM

## 2021-12-25 DIAGNOSIS — Z79899 Other long term (current) drug therapy: Secondary | ICD-10-CM | POA: Diagnosis not present

## 2021-12-25 DIAGNOSIS — M170 Bilateral primary osteoarthritis of knee: Secondary | ICD-10-CM

## 2021-12-25 DIAGNOSIS — M0579 Rheumatoid arthritis with rheumatoid factor of multiple sites without organ or systems involvement: Secondary | ICD-10-CM | POA: Diagnosis not present

## 2021-12-25 NOTE — Patient Instructions (Signed)
Standing Labs ?We placed an order today for your standing lab work.  ? ?Please have your standing labs drawn in July and every 3 months ? ?If possible, please have your labs drawn 2 weeks prior to your appointment so that the provider can discuss your results at your appointment. ? ?Please note that you may see your imaging and lab results in [facility] before we have reviewed them. ?We may be awaiting multiple results to interpret others before contacting you. ?Please allow our office up to 72 hours to thoroughly review all of the results before contacting the office for clarification of your results. ? ?We have open lab daily: ?Monday through Thursday from 1:30-4:30 PM and Friday from 1:30-4:00 PM ?at the office of Dr. Bo Merino, [facility].   ?Please be advised, all patients with office appointments requiring lab work will take precedent over walk-in lab work.  ?If possible, please come for your lab work on Monday and Friday afternoons, as you may experience shorter wait times. ?The office is located at [address], [place], [place], [place] [address] ?No appointment is necessary.   ?Labs are drawn by [facility]. Please bring your co-pay at the time of your lab draw.  You may receive a bill from [facility] for your lab work. ? ?Please note if you are on Hydroxychloroquine and and an order has been placed for a Hydroxychloroquine level, you will need to have it drawn 4 hours or more after your last dose. ? ?Vaccines ?You are taking a medication(s) that can suppress your immune system.  The following immunizations are recommended: ?Flu annually ?Covid-19  ?Td/Tdap (tetanus, diphtheria, pertussis) every 10 years ?Pneumonia (Prevnar 15 then Pneumovax 23 at least 1 year apart.  Alternatively, can take Prevnar 20 without needing additional dose) ?Shingrix: 2 doses from 4 weeks to 6 months apart ? ?Please check with your PCP to make sure you are up to date.  ? ?If you have signs or symptoms of an infection  or start antibiotics: ?First, call your PCP for workup of your infection. ?Hold your medication through the infection, until you complete your antibiotics, and until symptoms resolve if you take the following: ?Injectable medication (Actemra, Benlysta, Cimzia, Cosentyx, Enbrel, Humira, Kevzara, Orencia, Remicade, Simponi, [place], [place], [place]) ?Methotrexate ?Leflunomide Jolee Ewing) ?Mycophenolate (Cellcept) ?Roma Kayser, or Rinvoq  ? ?If you wish to have your labs drawn at another location, please call the office 24 hours in advance to send orders. ? ?If you have any questions regarding directions or hours of operation,  ?please call [phone].   ?As a reminder, please drink plenty of water prior to coming for your lab work. Thanks!  ?

## 2021-12-26 ENCOUNTER — Encounter: Payer: Self-pay | Admitting: Oncology

## 2021-12-26 LAB — CBC WITH DIFFERENTIAL/PLATELET
Absolute Monocytes: 522 cells/uL (ref 200–950)
Basophils Absolute: 18 cells/uL (ref 0–200)
Basophils Relative: 0.4 %
Eosinophils Absolute: 41 cells/uL (ref 15–500)
Eosinophils Relative: 0.9 %
HCT: 38.5 % (ref 38.5–50.0)
Hemoglobin: 12.6 g/dL — ABNORMAL LOW (ref 13.2–17.1)
Lymphs Abs: 756 cells/uL — ABNORMAL LOW (ref 850–3900)
MCH: 32.2 pg (ref 27.0–33.0)
MCHC: 32.7 g/dL (ref 32.0–36.0)
MCV: 98.5 fL (ref 80.0–100.0)
MPV: 9.8 fL (ref 7.5–12.5)
Monocytes Relative: 11.6 %
Neutro Abs: 3164 cells/uL (ref 1500–7800)
Neutrophils Relative %: 70.3 %
Platelets: 253 10*3/uL (ref 140–400)
RBC: 3.91 10*6/uL — ABNORMAL LOW (ref 4.20–5.80)
RDW: 13.6 % (ref 11.0–15.0)
Total Lymphocyte: 16.8 %
WBC: 4.5 10*3/uL (ref 3.8–10.8)

## 2021-12-26 LAB — COMPLETE METABOLIC PANEL WITH GFR
AG Ratio: 1.4 (calc) (ref 1.0–2.5)
ALT: 12 U/L (ref 9–46)
AST: 19 U/L (ref 10–35)
Albumin: 4.3 g/dL (ref 3.6–5.1)
Alkaline phosphatase (APISO): 44 U/L (ref 35–144)
BUN: 19 mg/dL (ref 7–25)
CO2: 26 mmol/L (ref 20–32)
Calcium: 9.8 mg/dL (ref 8.6–10.3)
Chloride: 103 mmol/L (ref 98–110)
Creat: 0.79 mg/dL (ref 0.70–1.35)
Globulin: 3.1 g/dL (calc) (ref 1.9–3.7)
Glucose, Bld: 92 mg/dL (ref 65–99)
Potassium: 4.7 mmol/L (ref 3.5–5.3)
Sodium: 141 mmol/L (ref 135–146)
Total Bilirubin: 1 mg/dL (ref 0.2–1.2)
Total Protein: 7.4 g/dL (ref 6.1–8.1)
eGFR: 100 mL/min/{1.73_m2} (ref >=60)

## 2021-12-26 NOTE — Progress Notes (Signed)
Hemoglobin is low and stable.  CMP is normal.

## 2021-12-27 ENCOUNTER — Encounter: Payer: Self-pay | Admitting: Oncology

## 2021-12-28 ENCOUNTER — Encounter: Payer: Self-pay | Admitting: Oncology

## 2021-12-28 NOTE — Progress Notes (Signed)
? ?                                                                                                                                                          ?  Patient Name: Dustin [name] ?MRN: [id number] ?DOB: 08/25/1961 ?Referring Physician: [name] (Profile Not Attached) ?Date of Service: 11/20/2021 ?[facility], [place] ? ?                                                      End Of Treatment Note ? ?Diagnoses: C11.8-Malignant neoplasm of overlapping sites of nasopharynx ? ?Cancer Staging:  Cancer Staging  ?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[place]) ?Staging form: Pharynx - Nasopharynx, AJCC 8th Edition ?- Clinical: Stage III (cT3, cN2, cM0) - Signed by Ladell Pier, MD on 06/12/2021 ? ? ?Intent: Curative ? ?Radiation Treatment Dates: 10/03/2021 through 11/20/2021 ?Site Technique Total Dose (Gy) Dose per Fx (Gy) Completed Fx Beam Energies  ?Nasopharynx: HN_NasoP IMRT 70/70 2 35/35 6X  ? ?Narrative: The patient tolerated radiation therapy relatively well.  ? ?Plan: The patient will follow-up with radiation oncology in 2-3 wks. ? ?----------------------------------- ? ?Eppie Gibson, MD ? ?

## 2022-01-03 ENCOUNTER — Encounter: Payer: Self-pay | Admitting: *Deleted

## 2022-01-03 NOTE — Progress Notes (Signed)
PATIENT NAVIGATOR PROGRESS NOTE ? ?Name: Dustin [name] ?Date: 01/03/2022 ?MRN: [id number]  ?DOB: 1960-12-13 ? ? ?Reason for visit:  ?C/O headaches, sinus drainage, no fever or other symptoms on Wednesday  ? ?Comments:  Discussed allergies and possibly introduce allergy medication, we discussed loratadine.  ?He called back today and headaches have subsided after starting loratadine.  Still having some drainage but it is improved. ?Energy level has improved able to mow grass without stopping.  ? ? ?Time spent counseling/coordinating care: 30-45 minutes ? ?

## 2022-01-09 ENCOUNTER — Ambulatory Visit: Payer: BC Managed Care – PPO | Attending: Radiation Oncology | Admitting: Physical Therapy

## 2022-01-09 ENCOUNTER — Encounter: Payer: Self-pay | Admitting: Physical Therapy

## 2022-01-09 DIAGNOSIS — C111 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: Secondary | ICD-10-CM | POA: Insufficient documentation

## 2022-01-09 DIAGNOSIS — R293 Abnormal posture: Secondary | ICD-10-CM | POA: Insufficient documentation

## 2022-01-09 DIAGNOSIS — C119 Malignant neoplasm of nasopharynx, unspecified: Secondary | ICD-10-CM | POA: Insufficient documentation

## 2022-01-09 NOTE — Therapy (Signed)
[facility] ?[place], [place], [address] ?Phone: [phone]   Fax:  [phone] ? ?Physical Therapy Treatment ? ?Patient Details  ?Name: Dustin [name] ?MRN: [id number] ?Date of Birth: 1961-01-25 ?Referring Provider (PT): Isidore Moos ? ? ?Encounter Date: 01/09/2022 ? ? PT End of Session - 01/09/22 1052   ? ? Visit Number 2   ? Number of Visits 6   ? Date for PT Re-Evaluation 02/06/22   ? PT Start Time 1003   ? PT Stop Time 8616   ? PT Time Calculation (min) 46 min   ? Activity Tolerance Patient tolerated treatment well   ? Behavior During Therapy [facility] for tasks assessed/performed   ? ?  ?  ? ?  ? ? ?Past Medical History:  ?Diagnosis Date  ? Anxiety   ? COPD (chronic obstructive pulmonary disease) ([place])   ? Depression   ? Essential hypertension   ? GERD (gastroesophageal reflux disease)   ? H/O hiatal hernia   ? History of cardiac catheterization   ? Normal coronaries May 2017  ? History of ITP   ? Childhood  ? Hypothyroidism   ? Osteoarthritis   ? Type 2 diabetes mellitus ([place])   ? no meds  ? ? ?Past Surgical History:  ?Procedure Laterality Date  ? ANTERIOR CERVICAL DECOMP/DISCECTOMY FUSION  01/21/2012  ? Procedure: ANTERIOR CERVICAL DECOMPRESSION/DISCECTOMY FUSION 2 LEVELS;  Surgeon: Erline Levine, MD;  Location: [facility];  Service: Neurosurgery;  Laterality: N/A;  Cervical Six-Seven, Cervical Seven-Thoracic One, Anterior Cervical Decompression with Fusion Interbody Prothesis Plating and Bonegraft possible posterior Cervical Seven-Thoracic One Foraminotomy  ? CARDIAC CATHETERIZATION  [phone]  ? normal coronary arteries  ? CARDIAC CATHETERIZATION N/A 01/22/2016  ? Procedure: Left Heart Cath and Coronary Angiography;  Surgeon: Burnell Blanks, MD;  Location: [facility];  Service: Cardiovascular;  Laterality: N/A;  ? CARPAL TUNNEL RELEASE Bilateral   ? CERVICAL DISC SURGERY  04  ? CHOLECYSTECTOMY N/A 10/26/2014  ? Procedure:  LAPAROSCOPIC CHOLECYSTECTOMY;  Surgeon: Jamesetta So, MD;  Location: [facility];  Service: General;  Laterality: N/A;  ? ESOPHAGOGASTRODUODENOSCOPY N/A 05/03/2016  ? Procedure: ESOPHAGOGASTRODUODENOSCOPY (EGD);  Surgeon: Rogene Houston, MD;  [facility];  Service: Endoscopy;  Laterality: N/A;  10:30  ? HERNIA REPAIR Right 60's  ? IR IMAGING GUIDED PORT INSERTION  06/27/2021  ? Left elbow bursa removed    ? NASOPHARYNGOSCOPY N/A 06/04/2021  ? Procedure: NASAL ENDOSCOPY WITH BIOPSY OF NASOPHARYNX; FROZEN SECTION;  Surgeon: Izora Gala, MD;  Location: [facility];  Service: ENT;  Laterality: N/A;  ? TONSILLECTOMY  72  ? ? ?There were no vitals filed for this visit. ? ? Subjective Assessment - 01/09/22 1004   ? ? Subjective I have noticed my taste is changing some. Everything is starting to taste salty. I am getting my stamina back. When I first tried to mow my yard I could only make one pass. I was just worn out and as time has gone by I am able to do more and more.   ? Pertinent History SCC of the Nasopharynx, stage III (T3, N2, M0), 9/09 /22 CT neck revealed an aggressive nasopharyngeal tumor with bilateral lymphadenopathy and skull base erosion. 06/04/21 Biopsy of Nasopharyngeal mass revealed SCC, EBV negative. 06/19/21 PET revealed Substantially reduced size of the posterior nasopharyngeal mass. Current maximum SUV is 12.5, compatible with malignancy. There still adjacent sclerosis and irregularity in the clivus probably  from some degree of local invasion. Bilateral single level IIa lymph nodes are mildly enlarged and hypermetabolic as detailed above, compatible with malignant involvement.. No findings of metastatic disease to the chest, abdomen, or pelvis. 06/19/21 Trigeminal MRI revealed extensive tumor invasion of the skull base, throughout an area of roughly 4 cm in the midline including the entire clivus, posterior sphenoid sinuses, and the floor of the bony sella. No trigeminal or other distinct cranial  nerve involvement was otherwise seen. MRI also again revealed the the primary bulky nasopharyngeal soft tissue mass measuring roughly 2 cm, as well as the visible abnormal bilateral level 2 nodes; both appeared stable since imaging last month. Consult with Dr. Benay Spice on 06/12/21 and Dr. Isidore Moos on 06/20/21. Treatment options were discussed and the patient opted to proceed with induction chemo then proceed with chemo/radiation after. He received 3 cycles of chemotherapy which completed on 08/17/21.  Restaging PET on 09/14/21 demonstrated an interval response to therapy, defined by a decrease in size and degree of FDG uptake associated with the FDG avid posterior nasopharyngeal lesion. He will receive 35 fractions of radiation to his Nasopharynx and bilateral neck with weekly carboplatin.  CT simulation 1/9. He will start treatment on 10/03/21 and will complete 11/20/21. PAC placed 06/27/21. PEG to be placed as needed; also hx of COPD, DM2, history of cervical fusions in 2004 and 2013   ? Patient Stated Goals to gain info from providers   ? Currently in Pain? No/denies   ? Pain Score 0-No pain   ? ?  ?  ? ?  ? ? ? ? ? [facility] PT Assessment - 01/09/22 0001   ? ?  ? Observation/Other Assessments  ? Observations some very mild edema at anterior neck   ?  ? Sit to Stand  ? Comments 30 sec sit to stand: 11 reps avg is 15 reps for his age   ?  ? AROM  ? Cervical Flexion WFL   ? Cervical Extension 25% limited   ? Cervical - Right Side Bend 25% limited   ? Cervical - Left Side Bend 25% limited   ? Cervical - Right Rotation 25% limited   ? Cervical - Left Rotation 25% limited   ? ?  ?  ? ?  ? ? ? ? LYMPHEDEMA/ONCOLOGY QUESTIONNAIRE - 01/09/22 0001   ? ?  ? Head and Neck  ? 4 cm superior to sternal notch around neck 41 cm   ? 6 cm superior to sternal notch around neck 41.5 cm   ? 8 cm superior to sternal notch around neck 42 cm   ? Other 44   10 cm proximal to sternal notch  ? ?  ?  ? ?  ? ? ? ? ? ? ? ? ? ? ? ? ? [facility] Adult PT  Treatment/Exercise - 01/09/22 0001   ? ?  ? Manual Therapy  ? Manual Therapy Edema management   ? Edema Management created foam chip pack for pt to wear to help decrease edema, instructed pt in very basic technique for stretching skin on anterior neck to decrease swelling   ? ?  ?  ? ?  ? ? ? ? ? ? ? ? ? ? PT Education - 01/09/22 1056   ? ? Education Details anatomy and physiology of the lymphatic system, how to manage lymphedema through MLD and compression   ? Person(s) Educated Patient   ? Methods Explanation   ? Comprehension Verbalized understanding   ? ?  ?  ? ?  ? ? ? ? ? ?  PT Long Term Goals - 01/09/22 1056   ? ?  ? PT LONG TERM GOAL #1  ? Title Pt will return to baseline cervical ROM and not demonstrate any signs or symptoms of lymphedema.   ? Baseline 01/09/22- pt has returned to baseline ROM but does have some anterior neck edema   ? Time 10   ? Period Weeks   ? Status Partially Met   ?  ? PT LONG TERM GOAL #2  ? Title Pt will be able to independently manage his anterior neck edema through self MLD and compression.   ? Time 4   ? Period Weeks   ? Status New   ? Target Date 02/06/22   ? ?  ?  ? ?  ? ? ? ? ? ? ? ? Plan - 01/09/22 1053   ? ? Clinical Impression Statement Pt reports he has completed chemo and radiation for treatment of SCC of nasopharynx. Pt reports his energy level is improving and he was able to complete an additional sit to stand during 30 sec sit to stand test when compared to eval. Pt has returned to baseline neck ROM. He does have some very mild edema at anterior neck. He reports it has not changed over the last month. There is currently no fibrosis present in this area. Instructed pt today in very basic self MLD technique and issued pt a chip pack to wear while at home to help decrease swelling. Pt would benefit from skilled PT services to decrease anterior neck swelling and instruct pt in independent management of lymphedema.   ? Rehab Potential Good   ? PT Frequency 1x / week   ? PT  Duration 4 weeks   ? PT Treatment/Interventions ADLs/Self Care Home Management;Therapeutic exercise;Patient/family education;Manual lymph drainage;Compression bandaging;Orthotic Fit/Training;Vasopneumatic Device   ? PT Next V

## 2022-01-15 ENCOUNTER — Encounter: Payer: Self-pay | Admitting: Physical Therapy

## 2022-01-15 ENCOUNTER — Ambulatory Visit: Payer: BC Managed Care – PPO | Attending: Radiation Oncology | Admitting: Physical Therapy

## 2022-01-15 DIAGNOSIS — C111 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: Secondary | ICD-10-CM | POA: Insufficient documentation

## 2022-01-15 DIAGNOSIS — I890 Lymphedema, not elsewhere classified: Secondary | ICD-10-CM | POA: Insufficient documentation

## 2022-01-15 DIAGNOSIS — R293 Abnormal posture: Secondary | ICD-10-CM | POA: Insufficient documentation

## 2022-01-15 NOTE — Therapy (Signed)
[facility] ?[facility] ?[place], [place], [address] ?Phone: [phone]   Fax:  [phone] ? ?Physical Therapy Treatment ? ?Patient Details  ?Name: Dustin [name] ?MRN: [id number] ?Date of Birth: Aug 03, 1961 ?Referring Provider (PT): Isidore Moos ? ? ?Encounter Date: 01/15/2022 ? ? PT End of Session - 01/15/22 0904   ? ? Visit Number 3   ? Number of Visits 6   ? Date for PT Re-Evaluation 02/06/22   ? PT Start Time [phone]   ? PT Stop Time [phone]   ? PT Time Calculation (min) 49 min   ? Activity Tolerance Patient tolerated treatment well   ? Behavior During Therapy [facility] for tasks assessed/performed   ? ?  ?  ? ?  ? ? ?Past Medical History:  ?Diagnosis Date  ? Anxiety   ? COPD (chronic obstructive pulmonary disease) ([place])   ? Depression   ? Essential hypertension   ? GERD (gastroesophageal reflux disease)   ? H/O hiatal hernia   ? History of cardiac catheterization   ? Normal coronaries May 2017  ? History of ITP   ? Childhood  ? Hypothyroidism   ? Osteoarthritis   ? Type 2 diabetes mellitus ([place])   ? no meds  ? ? ?Past Surgical History:  ?Procedure Laterality Date  ? ANTERIOR CERVICAL DECOMP/DISCECTOMY FUSION  01/21/2012  ? Procedure: ANTERIOR CERVICAL DECOMPRESSION/DISCECTOMY FUSION 2 LEVELS;  Surgeon: Erline Levine, MD;  Location: [place] [facility]URO ORS;  Service: Neurosurgery;  Laterality: N/A;  Cervical Six-Seven, Cervical Seven-Thoracic One, Anterior Cervical Decompression with Fusion Interbody Prothesis Plating and Bonegraft possible posterior Cervical Seven-Thoracic One Foraminotomy  ? CARDIAC CATHETERIZATION  [phone]  ? normal coronary arteries  ? CARDIAC CATHETERIZATION N/A 01/22/2016  ? Procedure: Left Heart Cath and Coronary Angiography;  Surgeon: Burnell Blanks, MD;  Location: [facility] CV LAB;  Service: Cardiovascular;  Laterality: N/A;  ? CARPAL TUNNEL RELEASE Bilateral   ? CERVICAL DISC SURGERY  04  ? CHOLECYSTECTOMY N/A 10/26/2014  ? Procedure: LAPAROSCOPIC  CHOLECYSTECTOMY;  Surgeon: Jamesetta So, MD;  Location: [facility];  Service: General;  Laterality: N/A;  ? ESOPHAGOGASTRODUODENOSCOPY N/A 05/03/2016  ? Procedure: ESOPHAGOGASTRODUODENOSCOPY (EGD);  Surgeon: Rogene Houston, MD;  [facility];  Service: Endoscopy;  Laterality: N/A;  10:30  ? HERNIA REPAIR Right 60's  ? IR IMAGING GUIDED PORT INSERTION  06/27/2021  ? Left elbow bursa removed    ? NASOPHARYNGOSCOPY N/A 06/04/2021  ? Procedure: NASAL ENDOSCOPY WITH BIOPSY OF NASOPHARYNX; FROZEN SECTION;  Surgeon: Izora Gala, MD;  Location: [facility];  Service: ENT;  Laterality: N/A;  ? TONSILLECTOMY  74  ? ? ?There were no vitals filed for this visit. ? ? Subjective Assessment - 01/15/22 0904   ? ? Subjective I have been wearing the chip pack every day while I am watching TV. I can feel my adams apple easier. I have been trying to do the stretching.   ? Pertinent History SCC of the Nasopharynx, stage III (T3, N2, M0), 9/09 /22 CT neck revealed an aggressive nasopharyngeal tumor with bilateral lymphadenopathy and skull base erosion. 06/04/21 Biopsy of Nasopharyngeal mass revealed SCC, EBV negative. 06/19/21 PET revealed Substantially reduced size of the posterior nasopharyngeal mass. Current maximum SUV is 12.5, compatible with malignancy. There still adjacent sclerosis and irregularity in the clivus probably from some degree of local invasion. Bilateral single level IIa lymph nodes are mildly enlarged and hypermetabolic as detailed above, compatible with malignant involvement.Marland Kitchen  No findings of metastatic disease to the chest, abdomen, or pelvis. 06/19/21 Trigeminal MRI revealed extensive tumor invasion of the skull base, throughout an area of roughly 4 cm in the midline including the entire clivus, posterior sphenoid sinuses, and the floor of the bony sella. No trigeminal or other distinct cranial nerve involvement was otherwise seen. MRI also again revealed the the primary bulky nasopharyngeal soft tissue mass  measuring roughly 2 cm, as well as the visible abnormal bilateral level 2 nodes; both appeared stable since imaging last month. Consult with Dr. Benay Spice on 06/12/21 and Dr. Isidore Moos on 06/20/21. Treatment options were discussed and the patient opted to proceed with induction chemo then proceed with chemo/radiation after. He received 3 cycles of chemotherapy which completed on 08/17/21.  Restaging PET on 09/14/21 demonstrated an interval response to therapy, defined by a decrease in size and degree of FDG uptake associated with the FDG avid posterior nasopharyngeal lesion. He will receive 35 fractions of radiation to his Nasopharynx and bilateral neck with weekly carboplatin.  CT simulation 1/9. He will start treatment on 10/03/21 and will complete 11/20/21. PAC placed 06/27/21. PEG to be placed as needed; also hx of COPD, DM2, history of cervical fusions in 2004 and 2013   ? Patient Stated Goals to gain info from providers   ? Currently in Pain? No/denies   ? Pain Score 0-No pain   ? ?  ?  ? ?  ? ? ? ? ? ? ? ? ? ? ? ? ? ? ? ? ? ? ? ? [facility] Adult PT Treatment/Exercise - 01/15/22 0001   ? ?  ? Manual Therapy  ? Manual Therapy Manual Lymphatic Drainage (MLD)   ? Edema Management issued info to pt to obtain a solaris swell spot for head and neck compression   ? Manual Lymphatic Drainage (MLD) Instructed pt in Norton anterior neck handout as follows and had pt return demonstrate entire sequence and skin stretch: short neck, 5 diaphragmatic breaths, bilateral axillary nodes, pectoral nodes, supraclavicular nodes, posterior, lateral and anterior neck then retracing all steps - pt required min to mod v/c and occasional tactile cues for correct skin stretch technique   ? ?  ?  ? ?  ? ? ? ? ? ? ? ? ? ? ? ? ? ? ? PT Long Term Goals - 01/09/22 1056   ? ?  ? PT LONG TERM GOAL #1  ? Title Pt will return to baseline cervical ROM and not demonstrate any signs or symptoms of lymphedema.   ? Baseline 01/09/22- pt has returned to baseline ROM  but does have some anterior neck edema   ? Time 10   ? Period Weeks   ? Status Partially Met   ?  ? PT LONG TERM GOAL #2  ? Title Pt will be able to independently manage his anterior neck edema through self MLD and compression.   ? Time 4   ? Period Weeks   ? Status New   ? Target Date 02/06/22   ? ?  ?  ? ?  ? ? ? ? ? ? ? ? Plan - 01/15/22 0958   ? ? Clinical Impression Statement Pt returns today to learn self MLD technique. Issued Norton head and neck anterior approach handout and had pt return demonstrate entire sequence and correct skin stretch techique. Pt was able to verbalize a good understanding of the physiology behind self MLD technique. He reports he has been having frequent headaches  recently that have not responded to medication. He reports he had these when he was diagnosed and they had gone away with treatment until now. He has attempted contacting his ear, nose and throat doctor and left a message. Gave pt his nurse navigators info so he can contact her regarding his recent symptom. Will answer any questions pt has regarding self MLD at his next session. He was also given info to obtain a head and neck compression garment for long term management of edema.   ? PT Frequency 1x / week   ? PT Duration 4 weeks   ? PT Treatment/Interventions ADLs/Self Care Home Management;Therapeutic exercise;Patient/family education;Manual lymph drainage;Compression bandaging;Orthotic Fit/Training;Vasopneumatic Device   ? PT Next Visit Plan instruct pt in self MLD using norton anterior approach handout, did pt order garment?   ? PT Home Exercise Plan head and neck ROM exercises   ? Consulted and Agree with Plan of Care Patient   ? ?  ?  ? ?  ? ? ?Patient will benefit from skilled therapeutic intervention in order to improve the following deficits and impairments:  Postural dysfunction, Decreased knowledge of precautions, Increased edema ? ?Visit Diagnosis: ?Lymphedema, not elsewhere classified - Plan: PT plan of care  cert/re-cert ? ?Abnormal posture - Plan: PT plan of care cert/re-cert ? 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 ? ? ? ?Problem List ?Patient Active Pr

## 2022-01-16 ENCOUNTER — Ambulatory Visit: Payer: BC Managed Care – PPO

## 2022-01-23 ENCOUNTER — Encounter: Payer: Self-pay | Admitting: Physical Therapy

## 2022-01-23 ENCOUNTER — Ambulatory Visit: Payer: BC Managed Care – PPO | Admitting: Physical Therapy

## 2022-01-23 DIAGNOSIS — R293 Abnormal posture: Secondary | ICD-10-CM

## 2022-01-23 DIAGNOSIS — C111 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: Secondary | ICD-10-CM

## 2022-01-23 DIAGNOSIS — I890 Lymphedema, not elsewhere classified: Secondary | ICD-10-CM | POA: Diagnosis not present

## 2022-01-23 NOTE — Therapy (Signed)
[facility] ?[facility] [place] ?[place], [place], [address] ?Phone: [phone]   Fax:  [phone] ? ?Physical Therapy Treatment ? ?Patient Details  ?Name: ADMIRAL MARCUCCI ?MRN: [id number] ?Date of Birth: 08-10-1961 ?Referring Provider (PT): Isidore Moos ? ? ?Encounter Date: 01/23/2022 ? ? PT End of Session - 01/23/22 1002   ? ? Visit Number 4   ? Number of Visits 6   ? Date for PT Re-Evaluation 02/06/22   ? PT Start Time 1002   ? PT Stop Time 1050   ? PT Time Calculation (min) 48 min   ? Activity Tolerance Patient tolerated treatment well   ? Behavior During Therapy [facility] for tasks assessed/performed   ? ?  ?  ? ?  ? ? ?Past Medical History:  ?Diagnosis Date  ? Anxiety   ? COPD (chronic obstructive pulmonary disease) ([place])   ? Depression   ? Essential hypertension   ? GERD (gastroesophageal reflux disease)   ? H/O hiatal hernia   ? History of cardiac catheterization   ? Normal coronaries May 2017  ? History of ITP   ? Childhood  ? Hypothyroidism   ? Osteoarthritis   ? Type 2 diabetes mellitus ([place])   ? no meds  ? ? ?Past Surgical History:  ?Procedure Laterality Date  ? ANTERIOR CERVICAL DECOMP/DISCECTOMY FUSION  01/21/2012  ? Procedure: ANTERIOR CERVICAL DECOMPRESSION/DISCECTOMY FUSION 2 LEVELS;  Surgeon: Erline Levine, MD;  Location: [place] NEURO ORS;  Service: Neurosurgery;  Laterality: N/A;  Cervical Six-Seven, Cervical Seven-Thoracic One, Anterior Cervical Decompression with Fusion Interbody Prothesis Plating and Bonegraft possible posterior Cervical Seven-Thoracic One Foraminotomy  ? CARDIAC CATHETERIZATION  [phone]  ? normal coronary arteries  ? CARDIAC CATHETERIZATION N/A 01/22/2016  ? Procedure: Left Heart Cath and Coronary Angiography;  Surgeon: Burnell Blanks, MD;  Location: [place] CV LAB;  Service: Cardiovascular;  Laterality: N/A;  ? CARPAL TUNNEL RELEASE Bilateral   ? CERVICAL DISC SURGERY  04  ? CHOLECYSTECTOMY N/A 10/26/2014  ? Procedure:  LAPAROSCOPIC CHOLECYSTECTOMY;  Surgeon: Jamesetta So, MD;  Location: [facility];  Service: General;  Laterality: N/A;  ? ESOPHAGOGASTRODUODENOSCOPY N/A 05/03/2016  ? Procedure: ESOPHAGOGASTRODUODENOSCOPY (EGD);  Surgeon: Rogene Houston, MD;  [facility];  Service: Endoscopy;  Laterality: N/A;  10:30  ? HERNIA REPAIR Right 60's  ? IR IMAGING GUIDED PORT INSERTION  06/27/2021  ? Left elbow bursa removed    ? NASOPHARYNGOSCOPY N/A 06/04/2021  ? Procedure: NASAL ENDOSCOPY WITH BIOPSY OF NASOPHARYNX; FROZEN SECTION;  Surgeon: Izora Gala, MD;  Location: [facility];  Service: ENT;  Laterality: N/A;  ? TONSILLECTOMY  74  ? ? ?There were no vitals filed for this visit. ? ? Subjective Assessment - 01/23/22 1003   ? ? Subjective I am just using the chip pack. I think I have been doing the massage correctly. I go to the ear nose and throat doctor tomorrow and the cancer center on Friday to discuss my headaches.   ? Pertinent History SCC of the Nasopharynx, stage III (T3, N2, M0), 9/09 /22 CT neck revealed an aggressive nasopharyngeal tumor with bilateral lymphadenopathy and skull base erosion. 06/04/21 Biopsy of Nasopharyngeal mass revealed SCC, EBV negative. 06/19/21 PET revealed Substantially reduced size of the posterior nasopharyngeal mass. Current maximum SUV is 12.5, compatible with malignancy. There still adjacent sclerosis and irregularity in the clivus probably from some degree of local invasion. Bilateral single level IIa lymph nodes are mildly enlarged and hypermetabolic  as detailed above, compatible with malignant involvement.. No findings of metastatic disease to the chest, abdomen, or pelvis. 06/19/21 Trigeminal MRI revealed extensive tumor invasion of the skull base, throughout an area of roughly 4 cm in the midline including the entire clivus, posterior sphenoid sinuses, and the floor of the bony sella. No trigeminal or other distinct cranial nerve involvement was otherwise seen. MRI also again revealed the  the primary bulky nasopharyngeal soft tissue mass measuring roughly 2 cm, as well as the visible abnormal bilateral level 2 nodes; both appeared stable since imaging last month. Consult with Dr. Benay Spice on 06/12/21 and Dr. Isidore Moos on 06/20/21. Treatment options were discussed and the patient opted to proceed with induction chemo then proceed with chemo/radiation after. He received 3 cycles of chemotherapy which completed on 08/17/21.  Restaging PET on 09/14/21 demonstrated an interval response to therapy, defined by a decrease in size and degree of FDG uptake associated with the FDG avid posterior nasopharyngeal lesion. He will receive 35 fractions of radiation to his Nasopharynx and bilateral neck with weekly carboplatin.  CT simulation 1/9. He will start treatment on 10/03/21 and will complete 11/20/21. PAC placed 06/27/21. PEG to be placed as needed; also hx of COPD, DM2, history of cervical fusions in 2004 and 2013   ? Patient Stated Goals to gain info from providers   ? Currently in Pain? No/denies   just headache and sinus issues  ? Pain Score 0-No pain   ? ?  ?  ? ?  ? ? ? ? ? ? ? ? LYMPHEDEMA/ONCOLOGY QUESTIONNAIRE - 01/23/22 0001   ? ?  ? Head and Neck  ? 4 cm superior to sternal notch around neck 41 cm   ? 6 cm superior to sternal notch around neck 42 cm   ? 8 cm superior to sternal notch around neck 42.5 cm   ? ?  ?  ? ?  ? ? ? ? ? ? ? ? ? ? ? ? ? [place] Adult PT Treatment/Exercise - 01/23/22 0001   ? ?  ? Manual Therapy  ? Manual Lymphatic Drainage (MLD) Instructed pt in [place] anterior neck handout as follows and had pt return demonstrate entire sequence and skin stretch: short neck, 5 diaphragmatic breaths, bilateral axillary nodes, pectoral nodes, supraclavicular nodes, posterior, lateral and anterior neck then retracing all steps -only occasional verbal cues for correct form then therapist completed session with pt supine and head elevated working on anterior neck then retracing all pathways   ? ?  ?  ? ?   ? ? ? ? ? ? ? ? ? ? ? ? ? ? ? PT Long Term Goals - 01/09/22 1056   ? ?  ? PT LONG TERM GOAL #1  ? Title Pt will return to baseline cervical ROM and not demonstrate any signs or symptoms of lymphedema.   ? Baseline 01/09/22- pt has returned to baseline ROM but does have some anterior neck edema   ? Time 10   ? Period Weeks   ? Status Partially Met   ?  ? PT LONG TERM GOAL #2  ? Title Pt will be able to independently manage his anterior neck edema through self MLD and compression.   ? Time 4   ? Period Weeks   ? Status New   ? Target Date 02/06/22   ? ?  ?  ? ?  ? ? ? ? ? ? ? ? Plan - 01/23/22 1103   ? ?  Clinical Impression Statement Pt has been practicing self MLD at home and has nearly all the steps memorized. Instructed pt to be sure to clear proximal pathways prior to working on his neck. Remeasured circumferences today and there was a slight increase in circumferences but pt feels his neck edema has decreased and visually it does not look any larger. After pt demonstrated entire sequence then therapist continued MLD in supine. Pt encouraged to continue self MLD spending increased time at anterior neck and wearing chip pack as much as possible.   ? PT Frequency 1x / week   ? PT Duration 4 weeks   ? PT Treatment/Interventions ADLs/Self Care Home Management;Therapeutic exercise;Patient/family education;Manual lymph drainage;Compression bandaging;Orthotic Fit/Training;Vasopneumatic Device   ? PT Next Visit Plan cont to instruct pt in self MLD using norton anterior approach handout, did pt order garment? - remeasure circumferences   ? Consulted and Agree with Plan of Care Patient   ? ?  ?  ? ?  ? ? ?Patient will benefit from skilled therapeutic intervention in order to improve the following deficits and impairments:  Postural dysfunction, Decreased knowledge of precautions, Increased edema ? ?Visit Diagnosis: ?Lymphedema, not elsewhere classified ? ?Abnormal posture ? ?Malignant neoplasm of posterior wall of  nasopharynx ([facility]) ? ? ? ? ?Problem List ?Patient Active Problem List  ? Diagnosis Date Noted  ? 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[place]) 06/12/2021  ? Chest pain

## 2022-01-24 DIAGNOSIS — Z85818 Personal history of malignant neoplasm of other sites of lip, oral cavity, and pharynx: Secondary | ICD-10-CM | POA: Diagnosis not present

## 2022-01-24 DIAGNOSIS — R0981 Nasal congestion: Secondary | ICD-10-CM | POA: Diagnosis not present

## 2022-01-24 DIAGNOSIS — Z87891 Personal history of nicotine dependence: Secondary | ICD-10-CM | POA: Diagnosis not present

## 2022-01-24 DIAGNOSIS — R519 Headache, unspecified: Secondary | ICD-10-CM | POA: Diagnosis not present

## 2022-01-25 ENCOUNTER — Inpatient Hospital Stay: Payer: BC Managed Care – PPO | Attending: Nurse Practitioner

## 2022-01-25 ENCOUNTER — Inpatient Hospital Stay (HOSPITAL_BASED_OUTPATIENT_CLINIC_OR_DEPARTMENT_OTHER): Payer: BC Managed Care – PPO | Admitting: Nurse Practitioner

## 2022-01-25 ENCOUNTER — Encounter: Payer: Self-pay | Admitting: Nurse Practitioner

## 2022-01-25 VITALS — BP 125/69 | HR 100 | Temp 98.2°F | Resp 18 | Ht 73.0 in | Wt 229.4 lb

## 2022-01-25 DIAGNOSIS — M069 Rheumatoid arthritis, unspecified: Secondary | ICD-10-CM | POA: Insufficient documentation

## 2022-01-25 DIAGNOSIS — Z452 Encounter for adjustment and management of vascular access device: Secondary | ICD-10-CM | POA: Insufficient documentation

## 2022-01-25 DIAGNOSIS — E119 Type 2 diabetes mellitus without complications: Secondary | ICD-10-CM | POA: Insufficient documentation

## 2022-01-25 DIAGNOSIS — E039 Hypothyroidism, unspecified: Secondary | ICD-10-CM | POA: Insufficient documentation

## 2022-01-25 DIAGNOSIS — J449 Chronic obstructive pulmonary disease, unspecified: Secondary | ICD-10-CM | POA: Insufficient documentation

## 2022-01-25 DIAGNOSIS — C119 Malignant neoplasm of nasopharynx, unspecified: Secondary | ICD-10-CM | POA: Diagnosis not present

## 2022-01-25 DIAGNOSIS — Z95828 Presence of other vascular implants and grafts: Secondary | ICD-10-CM

## 2022-01-25 DIAGNOSIS — Z923 Personal history of irradiation: Secondary | ICD-10-CM | POA: Insufficient documentation

## 2022-01-25 MED ORDER — HEPARIN SOD (PORK) LOCK FLUSH 100 UNIT/ML IV SOLN
500.0000 [IU] | Freq: Once | INTRAVENOUS | Status: AC
  Administered 2022-01-25: 500 [IU] via INTRAVENOUS

## 2022-01-25 MED ORDER — SODIUM CHLORIDE 0.9% FLUSH
10.0000 mL | Freq: Once | INTRAVENOUS | Status: AC
  Administered 2022-01-25: 10 mL via INTRAVENOUS

## 2022-01-25 NOTE — Progress Notes (Signed)
?[facility] ?OFFICE PROGRESS NOTE ? ? ?Diagnosis: Nasopharynx carcinoma ? ?INTERVAL HISTORY:  ? ?Mr. Krueger returns prior to scheduled follow-up due to concern regarding his return to work date and how his life insurance company filled out paperwork regarding his diagnosis.  He is scheduled to return to work 02/14/2022.  He has a PET scan 02/22/2022.  He would like to return to work after the PET scan has been completed.  Apparently life insurance paperwork he recently received indicated a very poor prognosis. ? ?He had an appointment with Dr. Constance Holster yesterday.  There was no evidence of persistent disease.  Due to headaches he was referred for an MRI of the brain.   ? ?He reports headaches are better and potentially sinus related. ? ?Objective: ? ?Vital signs in last 24 hours: ? ?Blood pressure 125/69, pulse 100, temperature 98.2 ?F (36.8 ?C), temperature source Oral, resp. rate 18, height '6\' 1"'$  (1.854 m), weight 229 lb 6.4 oz (104.1 kg), SpO2 98 %. ?  ? ? ?Lymphatics: No palpable cervical or supraclavicular lymph nodes. ?Resp: Lungs clear bilaterally. ?Cardio: Regular rate and rhythm. ?GI: Abdomen soft and nontender.  No hepatosplenomegaly. ?Vascular: No leg edema. ? ?Port-A-Cath without erythema. ? ?Lab Results: ? ?Lab Results  ?Component Value Date  ? WBC 4.5 12/25/2021  ? HGB 12.6 (L) 12/25/2021  ? HCT 38.5 12/25/2021  ? MCV 98.5 12/25/2021  ? PLT 253 12/25/2021  ? NEUTROABS 3,164 12/25/2021  ? ? ?Imaging: ? ?No results found. ? ?Medications: I have reviewed the patient's current medications. ? ?Assessment/Plan: ?Nasopharyngeal cancer, presenting with left neck adenopathy ?MRI cervical spine 04/27/2021-bilateral enlarged upper jugular chain lymph nodes.  Right level 2 node measures 18 mm ?CT neck 05/25/2021-bilateral nasopharyngeal mass eroding the skull base at the level of the clivus and inferior sphenoid sinus, enlarged right jugular digastric node into enlarged left level 2 nodes, mild soft tissue  density in the inferior left sphenoid sinus-tumor? ?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 ?MRI face 06/19/2021-extensive tumor invasion of the skull base, no trigeminal or other cranial nerve involvement, abnormal bilateral level 2 nodes ?PET 06/19/2021-decrease size of nasopharyngeal mass compared to 05/25/2021 CT, invasion of the clivus, single bilateral level 2A lymph nodes mildly enlarged and hypermetabolic, no evidence of distant metastatic disease ?Cycle 1 gemcitabine/carboplatin 07/06/2021, day 8 gemcitabine 07/05/2021 ?Cycle 2 gemcitabine/carboplatin 07/27/2021 ?Cycle 3 gemcitabine/carboplatin 08/17/2021 ?PET scan 09/14/2021-decrease in size and degree of FDG uptake associated with FDG avid posterior nasopharyngeal lesion.  Residual FDG uptake along the midline of the posterior nasopharynx.  Persistent but improved asymmetric increased uptake within the right tonsillar region.  Resolution of previous FDG avid cervical lymph nodes. 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  ? ?Disposition: Mr. Bunda remains in clinical remission from nasopharynx carcinoma.  Restaging PET scan is planned in June.  We discussed the goal of his treatment as being curative.  A representative from his life insurance company called him while he was in the office and informed him the paperwork was filled out incorrectly regarding his prognosis.  He felt much better after receiving that phone call.  He will contact the Scientist, research (medical) department of his employer regarding the return to work date.  He we will forward any additional paperwork to [place] for completion. ? ?Next scheduled visit  is 03/08/2022.  He will contact the office in the interim with  any problems. ? ?Patient seen with Dr. Benay Spice. ? ?Ned Card ANP/GNP-BC  ? ?01/25/2022  ?3:37 PM ?This was a shared visit with Ned Card.  Mr. Moone was interviewed and examined.  He is in clinical remission from the nasopharyngeal cancer.  We will see him after the restaging PET next month. ?We discussed the plan for continued surveillance. ? ?I was present for greater than 50% of today's visit.  I performed medical decision making. ? ?Julieanne Manson, MD ? ? ? ? ? ? ?

## 2022-01-28 ENCOUNTER — Encounter: Payer: Self-pay | Admitting: Oncology

## 2022-01-30 ENCOUNTER — Ambulatory Visit: Payer: BC Managed Care – PPO

## 2022-01-30 DIAGNOSIS — C111 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: Secondary | ICD-10-CM | POA: Diagnosis not present

## 2022-01-30 DIAGNOSIS — R293 Abnormal posture: Secondary | ICD-10-CM | POA: Diagnosis not present

## 2022-01-30 DIAGNOSIS — I890 Lymphedema, not elsewhere classified: Secondary | ICD-10-CM | POA: Diagnosis not present

## 2022-01-30 NOTE — Therapy (Signed)
[facility] ?[facility] ?[place], [place], [address] ?Phone: [phone]   Fax:  [phone] ? ?Physical Therapy Treatment ? ?Patient Details  ?Name: Dustin [name] ?MRN: [id number] ?Date of Birth: Mar 09, 1961 ?Referring Provider (PT): Isidore Moos ? ? ?Encounter Date: 01/30/2022 ? ? PT End of Session - 01/30/22 1023   ? ? Visit Number 5   ? Number of Visits 6   ? Date for PT Re-Evaluation 02/06/22   ? PT Start Time 1007   ? PT Stop Time 2563   ? PT Time Calculation (min) 56 min   ? Activity Tolerance Patient tolerated treatment well   ? Behavior During Therapy [facility] for tasks assessed/performed   ? ?  ?  ? ?  ? ? ?Past Medical History:  ?Diagnosis Date  ? Anxiety   ? COPD (chronic obstructive pulmonary disease) ([place])   ? Depression   ? Essential hypertension   ? GERD (gastroesophageal reflux disease)   ? H/O hiatal hernia   ? History of cardiac catheterization   ? Normal coronaries May 2017  ? History of ITP   ? Childhood  ? Hypothyroidism   ? Osteoarthritis   ? Type 2 diabetes mellitus ([place])   ? no meds  ? ? ?Past Surgical History:  ?Procedure Laterality Date  ? ANTERIOR CERVICAL DECOMP/DISCECTOMY FUSION  01/21/2012  ? Procedure: ANTERIOR CERVICAL DECOMPRESSION/DISCECTOMY FUSION 2 LEVELS;  Surgeon: Erline Levine, MD;  Location: [facility];  Service: Neurosurgery;  Laterality: N/A;  Cervical Six-Seven, Cervical Seven-Thoracic One, Anterior Cervical Decompression with Fusion Interbody Prothesis Plating and Bonegraft possible posterior Cervical Seven-Thoracic One Foraminotomy  ? CARDIAC CATHETERIZATION  [phone]  ? normal coronary arteries  ? CARDIAC CATHETERIZATION N/A 01/22/2016  ? Procedure: Left Heart Cath and Coronary Angiography;  Surgeon: Burnell Blanks, MD;  Location: [facility] CV LAB;  Service: Cardiovascular;  Laterality: N/A;  ? CARPAL TUNNEL RELEASE Bilateral   ? CERVICAL DISC SURGERY  04  ? CHOLECYSTECTOMY N/A 10/26/2014  ? Procedure:  LAPAROSCOPIC CHOLECYSTECTOMY;  Surgeon: Jamesetta So, MD;  Location: [facility];  Service: General;  Laterality: N/A;  ? ESOPHAGOGASTRODUODENOSCOPY N/A 05/03/2016  ? Procedure: ESOPHAGOGASTRODUODENOSCOPY (EGD);  Surgeon: Rogene Houston, MD;  [facility];  Service: Endoscopy;  Laterality: N/A;  10:30  ? HERNIA REPAIR Right 60's  ? IR IMAGING GUIDED PORT INSERTION  06/27/2021  ? Left elbow bursa removed    ? NASOPHARYNGOSCOPY N/A 06/04/2021  ? Procedure: NASAL ENDOSCOPY WITH BIOPSY OF NASOPHARYNX; FROZEN SECTION;  Surgeon: Izora Gala, MD;  Location: [facility];  Service: ENT;  Laterality: N/A;  ? TONSILLECTOMY  74  ? ? ?There were no vitals filed for this visit. ? ? Subjective Assessment - 01/30/22 1024   ? ? Subjective I'm doing really good. I do my self MLD throughout the day and wear my chip pack. I'm not going to order the compression garment at this time because I just don't have the money but also don't feel like I need it.   ? Pertinent History SCC of the Nasopharynx, stage III (T3, N2, M0), 9/09 /22 CT neck revealed an aggressive nasopharyngeal tumor with bilateral lymphadenopathy and skull base erosion. 06/04/21 Biopsy of Nasopharyngeal mass revealed SCC, EBV negative. 06/19/21 PET revealed Substantially reduced size of the posterior nasopharyngeal mass. Current maximum SUV is 12.5, compatible with malignancy. There still adjacent sclerosis and irregularity in the clivus probably from some degree of local invasion. Bilateral single level IIa  lymph nodes are mildly enlarged and hypermetabolic as detailed above, compatible with malignant involvement.. No findings of metastatic disease to the chest, abdomen, or pelvis. 06/19/21 Trigeminal MRI revealed extensive tumor invasion of the skull base, throughout an area of roughly 4 cm in the midline including the entire clivus, posterior sphenoid sinuses, and the floor of the bony sella. No trigeminal or other distinct cranial nerve involvement was otherwise seen.  MRI also again revealed the the primary bulky nasopharyngeal soft tissue mass measuring roughly 2 cm, as well as the visible abnormal bilateral level 2 nodes; both appeared stable since imaging last month. Consult with Dr. Benay Spice on 06/12/21 and Dr. Isidore Moos on 06/20/21. Treatment options were discussed and the patient opted to proceed with induction chemo then proceed with chemo/radiation after. He received 3 cycles of chemotherapy which completed on 08/17/21.  Restaging PET on 09/14/21 demonstrated an interval response to therapy, defined by a decrease in size and degree of FDG uptake associated with the FDG avid posterior nasopharyngeal lesion. He will receive 35 fractions of radiation to his Nasopharynx and bilateral neck with weekly carboplatin.  CT simulation 1/9. He will start treatment on 10/03/21 and will complete 11/20/21. PAC placed 06/27/21. PEG to be placed as needed; also hx of COPD, DM2, history of cervical fusions in 2004 and 2013   ? Patient Stated Goals to gain info from providers   ? Currently in Pain? No/denies   ? ?  ?  ? ?  ? ? ? ? ? ? ? ? ? ? ? ? ? ? ? ? ? ? ? ? [employer] Adult PT Treatment/Exercise - 01/30/22 0001   ? ?  ? Manual Therapy  ? Manual Lymphatic Drainage (MLD) Continued with Norton anterior neck handout as follows reviewing with pt throughout: short neck, 5 diaphragmatic breaths, bilateral axillary nodes, pectoral nodes, supraclavicular nodes, posterior, lateral and anterior neck then retracing all steps -only occasional verbal cues for correct form then therapist completed session with pt supine and head elevated working on anterior neck then retracing all pathways   ? ?  ?  ? ?  ? ? ? ? ? ? ? ? ? ? ? ? ? ? ? PT Long Term Goals - 01/09/22 1056   ? ?  ? PT LONG TERM GOAL #1  ? Title Pt will return to baseline cervical ROM and not demonstrate any signs or symptoms of lymphedema.   ? Baseline 01/09/22- pt has returned to baseline ROM but does have some anterior neck edema   ? Time 10   ? Period  Weeks   ? Status Partially Met   ?  ? PT LONG TERM GOAL #2  ? Title Pt will be able to independently manage his anterior neck edema through self MLD and compression.   ? Time 4   ? Period Weeks   ? Status New   ? Target Date 02/06/22   ? ?  ?  ? ?  ? ? ? ? ? ? ? ? Plan - 01/30/22 1104   ? ? Clinical Impression Statement Continued with manual lymph drainage of head/neck area reviewing with pt while performing. His neck edema is very mild to palpation and he reports compliance at home with daily self MLD and wear of his chip pack. He has one more session next week and will be ready for D/C at that time.   ? Stability/Clinical Decision Making Stable/Uncomplicated   ? Rehab Potential Good   ? PT Frequency  1x / week   ? PT Duration 4 weeks   ? PT Treatment/Interventions ADLs/Self Care Home Management;Therapeutic exercise;Patient/family education;Manual lymph drainage;Compression bandaging;Orthotic Fit/Training;Vasopneumatic Device   ? PT Next Visit Plan cont to instruct pt in self MLD using norton anterior approach handout, - remeasure circumferences and D/C next   ? PT Home Exercise Plan head and neck ROM exercises, wear chip pack and self MLD daily   ? Consulted and Agree with Plan of Care Patient   ? ?  ?  ? ?  ? ? ?Patient will benefit from skilled therapeutic intervention in order to improve the following deficits and impairments:  Postural dysfunction, Decreased knowledge of precautions, Increased edema ? ?Visit Diagnosis: ?Lymphedema, not elsewhere classified ? ?Abnormal posture ? 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 ? ? ? ?Problem List ?Patient Active Problem List  ? Diagnosis Date Noted  ? 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[place]) 06/12/2021  ? Chest pain of uncertain etiology   ? Former smoker 04/18/2017  ? 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 History of positive PPD treated with INH   04/14/2017  ? Abscess of left olecranon bursa 11/07/2016  ? High risk medication use 11/07/2016  ? Cervical disc disease 11/07/2016  ? Primary osteoarthritis of both hands 11/07/2016  ? Primary osteoarthritis of both k

## 2022-01-31 DIAGNOSIS — Z7984 Long term (current) use of oral hypoglycemic drugs: Secondary | ICD-10-CM | POA: Diagnosis not present

## 2022-01-31 DIAGNOSIS — H2513 Age-related nuclear cataract, bilateral: Secondary | ICD-10-CM | POA: Diagnosis not present

## 2022-01-31 DIAGNOSIS — E119 Type 2 diabetes mellitus without complications: Secondary | ICD-10-CM | POA: Diagnosis not present

## 2022-02-06 ENCOUNTER — Ambulatory Visit: Payer: BC Managed Care – PPO

## 2022-02-06 DIAGNOSIS — R293 Abnormal posture: Secondary | ICD-10-CM

## 2022-02-06 DIAGNOSIS — I890 Lymphedema, not elsewhere classified: Secondary | ICD-10-CM | POA: Diagnosis not present

## 2022-02-06 DIAGNOSIS — C111 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: Secondary | ICD-10-CM | POA: Diagnosis not present

## 2022-02-06 NOTE — Therapy (Addendum)
[facility] [place], [place], [address] Phone: [phone]   Fax:  [phone]  Physical Therapy Treatment  Patient Details  Name: ZOHAIR EPP MRN: [id number] Date of Birth: Nov 05, 1960 Referring Provider (PT): Isidore Moos   Encounter Date: 02/06/2022   PT End of Session - 02/06/22 1209     Visit Number 6    Number of Visits 6    Date for PT Re-Evaluation 02/06/22    PT Start Time 2585    PT Stop Time 1104    PT Time Calculation (min) 62 min    Activity Tolerance Patient tolerated treatment well    Behavior During Therapy [facility] for tasks assessed/performed             Past Medical History:  Diagnosis Date   Anxiety    COPD (chronic obstructive pulmonary disease) ([place])    Depression    Essential hypertension    GERD (gastroesophageal reflux disease)    H/O hiatal hernia    History of cardiac catheterization    Normal coronaries May 2017   History of ITP    Childhood   Hypothyroidism    Osteoarthritis    Type 2 diabetes mellitus ([place])    no meds    Past Surgical History:  Procedure Laterality Date   ANTERIOR CERVICAL DECOMP/DISCECTOMY FUSION  01/21/2012   Procedure: ANTERIOR CERVICAL DECOMPRESSION/DISCECTOMY FUSION 2 LEVELS;  Surgeon: Erline Levine, MD;  [facility];  Service: Neurosurgery;  Laterality: N/A;  Cervical Six-Seven, Cervical Seven-Thoracic One, Anterior Cervical Decompression with Fusion Interbody Prothesis Plating and Bonegraft possible posterior Cervical Seven-Thoracic One Foraminotomy   CARDIAC CATHETERIZATION  [phone]   normal coronary arteries   CARDIAC CATHETERIZATION N/A 01/22/2016   Procedure: Left Heart Cath and Coronary Angiography;  Surgeon: Burnell Blanks, MD;  Location: [facility] CV LAB;  Service: Cardiovascular;  Laterality: N/A;   CARPAL TUNNEL RELEASE Bilateral    CERVICAL DISC SURGERY  04   CHOLECYSTECTOMY N/A 10/26/2014   Procedure:  LAPAROSCOPIC CHOLECYSTECTOMY;  Surgeon: Jamesetta So, MD;  Location: [facility];  Service: General;  Laterality: N/A;   ESOPHAGOGASTRODUODENOSCOPY N/A 05/03/2016   Procedure: ESOPHAGOGASTRODUODENOSCOPY (EGD);  Surgeon: Rogene Houston, MD;  [facility];  Service: Endoscopy;  Laterality: N/A;  10:30   HERNIA REPAIR Right 60's   IR IMAGING GUIDED PORT INSERTION  06/27/2021   Left elbow bursa removed     NASOPHARYNGOSCOPY N/A 06/04/2021   Procedure: NASAL ENDOSCOPY WITH BIOPSY OF NASOPHARYNX; FROZEN SECTION;  Surgeon: Izora Gala, MD;  Location: [facility];  Service: ENT;  Laterality: N/A;   TONSILLECTOMY  74    There were no vitals filed for this visit.   Subjective Assessment - 02/06/22 1004     Subjective I haven't been doing too good because I just lost 2 of my brothers in 12 days. But my neck is doing fine and I am ready to D/C today. I have noticed some Lt foot drop recently and I've put a call into Dr. Michel Bickers office to tell them about that.    Pertinent History SCC of the Nasopharynx, stage III (T3, N2, M0), 9/09 /22 CT neck revealed an aggressive nasopharyngeal tumor with bilateral lymphadenopathy and skull base erosion. 06/04/21 Biopsy of Nasopharyngeal mass revealed SCC, EBV negative. 06/19/21 PET revealed Substantially reduced size of the posterior nasopharyngeal mass. Current maximum SUV is 12.5, compatible with malignancy. There still adjacent sclerosis and irregularity in the clivus probably from  some degree of local invasion. Bilateral single level IIa lymph nodes are mildly enlarged and hypermetabolic as detailed above, compatible with malignant involvement.. No findings of metastatic disease to the chest, abdomen, or pelvis. 06/19/21 Trigeminal MRI revealed extensive tumor invasion of the skull base, throughout an area of roughly 4 cm in the midline including the entire clivus, posterior sphenoid sinuses, and the floor of the bony sella. No trigeminal or other distinct cranial nerve  involvement was otherwise seen. MRI also again revealed the the primary bulky nasopharyngeal soft tissue mass measuring roughly 2 cm, as well as the visible abnormal bilateral level 2 nodes; both appeared stable since imaging last month. Consult with Dr. Benay Spice on 06/12/21 and Dr. Isidore Moos on 06/20/21. Treatment options were discussed and the patient opted to proceed with induction chemo then proceed with chemo/radiation after. He received 3 cycles of chemotherapy which completed on 08/17/21.  Restaging PET on 09/14/21 demonstrated an interval response to therapy, defined by a decrease in size and degree of FDG uptake associated with the FDG avid posterior nasopharyngeal lesion. He will receive 35 fractions of radiation to his Nasopharynx and bilateral neck with weekly carboplatin.  CT simulation 1/9. He will start treatment on 10/03/21 and will complete 11/20/21. PAC placed 06/27/21. PEG to be placed as needed; also hx of COPD, DM2, history of cervical fusions in 2004 and 2013    Patient Stated Goals to gain info from providers    Currently in Pain? No/denies                               [facility] Adult PT Treatment/Exercise - 02/06/22 0001       Self-Care   Self-Care Other Self-Care Comments    Other Self-Care Comments  Spent beginning of session discussing pts current functional status and how he is doing with the Maintenance Phase of his lymphedema treatment. He is self managing very well at this time. Pt also, though, brought up that he has started noticing some Lt foot drop and pt demonstrates 50-75% less DF compared to Rt. Inbox message sent to Dr. Benay Spice and his nurse Abigail Butts to update them on this change in status.      Manual Therapy   Manual Lymphatic Drainage (MLD) Continued with [facility] anterior neck handout as follows reviewing with pt throughout: short neck, 5 diaphragmatic breaths, Rtl axillary nodes, pectoral nodes, supraclavicular nodes, posterior, lateral and anterior neck  then retracing all steps -only occasional verbal cues for correct form then therapist completed session with pt supine and head elevated working on anterior neck then retracing all pathways                          PT Long Term Goals - 02/06/22 1007       PT LONG TERM GOAL #1   Title Pt will return to baseline cervical ROM and not demonstrate any signs or symptoms of lymphedema.    Baseline 01/09/22- pt has returned to baseline ROM but does have some anterior neck edema; overall neck ROM returns to baseline and pt demonstrates very mild lymphedema - 02/06/22    Status Achieved      PT LONG TERM GOAL #2   Title Pt will be able to independently manage his anterior neck edema through self MLD and compression.    Baseline Pt is independent with self MLD and wear of chip pack, also knows how  to order compression head/neck garment if he desires in the future - 02/06/22    Status Achieved                   Plan - 02/06/22 1011     Clinical Impression Statement Pt has done well with this episode of care and met all goals. He wil be placed on hold until he hears from Dr. Bernette Redbird office to see if they would like PT for his new Lt foot drop.    Stability/Clinical Decision Making Stable/Uncomplicated    Rehab Potential Good    PT Frequency 1x / week    PT Duration 4 weeks    PT Treatment/Interventions ADLs/Self Care Home Management;Therapeutic exercise;Patient/family education;Manual lymph drainage;Compression bandaging;Orthotic Fit/Training;Vasopneumatic Device    PT Next Visit Plan On hold until he hears from Dr. Bernette Redbird office regarding foot drop in case MD wants pt to have PT for this before returning to work but discussed with pt Neurorehab may be better option, will D/C from lymphedema treatment at this time though    PT Home Exercise Plan head and neck ROM exercises, wear chip pack and self MLD daily    Consulted and Agree with Plan of Care Patient              Patient will benefit from skilled therapeutic intervention in order to improve the following deficits and impairments:  Postural dysfunction, Decreased knowledge of precautions, Increased edema  Visit Diagnosis: Lymphedema, not elsewhere classified  Abnormal posture  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)     Problem List Patient Active Problem List   Diagnosis Date Noted   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 ([place]) 06/22/2021   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[place]) 06/12/2021   Chest pain of uncertain etiology    Former smoker 04/18/2017   Primary insomnia 04/14/2017   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   History of positive PPD treated with INH  04/14/2017   Abscess of left olecranon bursa 11/07/2016   High risk medication use 11/07/2016   Cervical disc disease 11/07/2016   Primary osteoarthritis of both hands 11/07/2016   Primary osteoarthritis of both knees 11/07/2016   Primary osteoarthritis of both shoulders 11/07/2016   Precordial pain 01/22/2016   Pain in the chest    Hyperlipidemia 01/20/2016   Depression with anxiety 01/20/2016   GERD (gastroesophageal reflux disease) 01/20/2016   Type 2 diabetes mellitus ([place]) 01/20/2016   Chest pain at rest 09/22/2014   Tobacco use 09/22/2014   Morbid obesity ([place]) 09/22/2014   Rheumatoid arthritis ([place]) 09/22/2014   Family history of heart disease 09/22/2014   Chest pain 09/22/2014    Otelia Limes, PTA 02/06/2022, 12:12 PM  [facility] [place], [place], [address] Phone: [phone]   Fax:  [phone]  Name: PAVAN BRING MRN: [id number] Date of Birth: Jul 01, 1961   PHYSICAL THERAPY DISCHARGE SUMMARY  Visits from Start of Care: 6  Current functional level related to goals / functional outcomes: See above   Remaining deficits: See above   Education / Equipment: HEP   Patient agrees to  discharge. Patient goals were met. Patient is being discharged due to meeting the stated rehab goals.  Allyson Sabal [place], [place] 09/26/22 2:16 PM

## 2022-02-08 ENCOUNTER — Telehealth: Payer: Self-pay | Admitting: *Deleted

## 2022-02-08 NOTE — Telephone Encounter (Signed)
Called patient to inform of Pet Scan for 02-22-22- arrival time- 7:30 am @ [facility], patient to have water only- 6 hrs. prior to test, patient to hold diabetic meds.,Dr. Isidore Moos to call this patient with the results on 02-22-22 (afternoon), due to her going on vacation, spoke with patient and he verified understanding these appts. and the instructions

## 2022-02-13 ENCOUNTER — Other Ambulatory Visit (HOSPITAL_BASED_OUTPATIENT_CLINIC_OR_DEPARTMENT_OTHER): Payer: Self-pay | Admitting: Otolaryngology

## 2022-02-13 ENCOUNTER — Other Ambulatory Visit: Payer: Self-pay | Admitting: Otolaryngology

## 2022-02-13 DIAGNOSIS — C119 Malignant neoplasm of nasopharynx, unspecified: Secondary | ICD-10-CM

## 2022-02-13 DIAGNOSIS — Z923 Personal history of irradiation: Secondary | ICD-10-CM

## 2022-02-14 DIAGNOSIS — C113 Malignant neoplasm of anterior wall of nasopharynx: Secondary | ICD-10-CM | POA: Diagnosis not present

## 2022-02-14 DIAGNOSIS — F411 Generalized anxiety disorder: Secondary | ICD-10-CM | POA: Diagnosis not present

## 2022-02-14 DIAGNOSIS — Z0001 Encounter for general adult medical examination with abnormal findings: Secondary | ICD-10-CM | POA: Diagnosis not present

## 2022-02-14 DIAGNOSIS — K219 Gastro-esophageal reflux disease without esophagitis: Secondary | ICD-10-CM | POA: Diagnosis not present

## 2022-02-14 DIAGNOSIS — E119 Type 2 diabetes mellitus without complications: Secondary | ICD-10-CM | POA: Diagnosis not present

## 2022-02-14 DIAGNOSIS — E7849 Other hyperlipidemia: Secondary | ICD-10-CM | POA: Diagnosis not present

## 2022-02-14 DIAGNOSIS — I10 Essential (primary) hypertension: Secondary | ICD-10-CM | POA: Diagnosis not present

## 2022-02-14 DIAGNOSIS — E039 Hypothyroidism, unspecified: Secondary | ICD-10-CM | POA: Diagnosis not present

## 2022-02-14 DIAGNOSIS — E063 Autoimmune thyroiditis: Secondary | ICD-10-CM | POA: Diagnosis not present

## 2022-02-14 DIAGNOSIS — Z6830 Body mass index (BMI) 30.0-30.9, adult: Secondary | ICD-10-CM | POA: Diagnosis not present

## 2022-02-15 ENCOUNTER — Ambulatory Visit (HOSPITAL_BASED_OUTPATIENT_CLINIC_OR_DEPARTMENT_OTHER)
Admission: RE | Admit: 2022-02-15 | Discharge: 2022-02-15 | Disposition: A | Discharge status: Home / Self Care | Payer: BC Managed Care – PPO | Source: Ambulatory Visit | Attending: Otolaryngology | Admitting: Otolaryngology

## 2022-02-15 ENCOUNTER — Telehealth: Payer: Self-pay

## 2022-02-15 DIAGNOSIS — R519 Headache, unspecified: Secondary | ICD-10-CM | POA: Diagnosis not present

## 2022-02-15 DIAGNOSIS — C119 Malignant neoplasm of nasopharynx, unspecified: Secondary | ICD-10-CM | POA: Insufficient documentation

## 2022-02-15 DIAGNOSIS — Z923 Personal history of irradiation: Secondary | ICD-10-CM | POA: Insufficient documentation

## 2022-02-15 MED ORDER — GADOBUTROL 1 MMOL/ML IV SOLN
10.0000 mL | Freq: Once | INTRAVENOUS | Status: AC | PRN
  Administered 2022-02-15: 10 mL via INTRAVENOUS
  Filled 2022-02-15: qty 10

## 2022-02-15 NOTE — Telephone Encounter (Signed)
-----   Message from Ladell Pier, MD sent at 02/06/2022  8:31 PM EDT ----- Regarding: FW: new foot drop Please call him to assess symptoms, we can see him or he can see primary provider ----- Message ----- From: Suanne Marker, PTA Sent: 02/06/2022  11:03 AM EDT To: Lin Givens, RN, Ladell Pier, MD Subject: new foot drop                                  Hi Dr. Benay Spice and Abigail Butts,   Reaching out to both not knowing who is available. We are getting ready to D/C Mr. Tolan from his lymphedema treatment. But today he mentioned that he had started noticing Lt foot drop over the past 2 weeks. He is limited 50-75% compared to the Rt with dorsiflexion in sitting. He put a call in yesterday to your office but hadn't heard back so just wanted to let you know what we saw today and update you on our findings. He hopes to get back to work end of next week so didn't know if you would wanted to see him before he returns.   Thank you! Val

## 2022-02-15 NOTE — Telephone Encounter (Signed)
TC to Pt stated he spoke with Helaine Chess he stated he saw a therapist at [facility] who stated it was not that bad because he can lift up his foot and inch and a half off the ground. Pt reports his left foot the top of the foot and toes become numb. Pt stated he will wait to see Dr Benay Spice at his upcoming appointment to discuss this with him

## 2022-02-22 ENCOUNTER — Inpatient Hospital Stay: Payer: BC Managed Care – PPO | Attending: Nurse Practitioner | Admitting: Dietician

## 2022-02-22 ENCOUNTER — Encounter: Payer: Self-pay | Admitting: Radiation Oncology

## 2022-02-22 ENCOUNTER — Encounter (HOSPITAL_COMMUNITY)
Admission: RE | Admit: 2022-02-22 | Discharge: 2022-02-22 | Disposition: A | Discharge status: Home / Self Care | Payer: BC Managed Care – PPO | Source: Ambulatory Visit | Attending: Radiation Oncology | Admitting: Radiation Oncology

## 2022-02-22 DIAGNOSIS — M21372 Foot drop, left foot: Secondary | ICD-10-CM | POA: Insufficient documentation

## 2022-02-22 DIAGNOSIS — I700 Atherosclerosis of aorta: Secondary | ICD-10-CM | POA: Diagnosis not present

## 2022-02-22 DIAGNOSIS — J439 Emphysema, unspecified: Secondary | ICD-10-CM | POA: Diagnosis not present

## 2022-02-22 DIAGNOSIS — Z923 Personal history of irradiation: Secondary | ICD-10-CM | POA: Insufficient documentation

## 2022-02-22 DIAGNOSIS — C119 Malignant neoplasm of nasopharynx, unspecified: Secondary | ICD-10-CM | POA: Insufficient documentation

## 2022-02-22 DIAGNOSIS — E039 Hypothyroidism, unspecified: Secondary | ICD-10-CM | POA: Insufficient documentation

## 2022-02-22 DIAGNOSIS — N200 Calculus of kidney: Secondary | ICD-10-CM | POA: Diagnosis not present

## 2022-02-22 DIAGNOSIS — C760 Malignant neoplasm of head, face and neck: Secondary | ICD-10-CM | POA: Diagnosis not present

## 2022-02-22 DIAGNOSIS — E119 Type 2 diabetes mellitus without complications: Secondary | ICD-10-CM | POA: Insufficient documentation

## 2022-02-22 DIAGNOSIS — Z9221 Personal history of antineoplastic chemotherapy: Secondary | ICD-10-CM | POA: Insufficient documentation

## 2022-02-22 DIAGNOSIS — M069 Rheumatoid arthritis, unspecified: Secondary | ICD-10-CM | POA: Insufficient documentation

## 2022-02-22 DIAGNOSIS — J449 Chronic obstructive pulmonary disease, unspecified: Secondary | ICD-10-CM | POA: Insufficient documentation

## 2022-02-22 DIAGNOSIS — Z79899 Other long term (current) drug therapy: Secondary | ICD-10-CM | POA: Insufficient documentation

## 2022-02-22 LAB — GLUCOSE, CAPILLARY: Glucose-Capillary: 124 mg/dL — ABNORMAL HIGH (ref 70–99)

## 2022-02-22 MED ORDER — FLUDEOXYGLUCOSE F - 18 (FDG) INJECTION
10.6000 | Freq: Once | INTRAVENOUS | Status: AC | PRN
  Administered 2022-02-22: 11.3 via INTRAVENOUS

## 2022-02-22 NOTE — Progress Notes (Signed)
Nutrition Follow-up:  Patient has completed chemoradiation for nasopharyngeal cancer.   Patient arrived early to appointment. He reports having PET scan this morning. Patient is looking forward to going back to work and hopeful of returning next week pending result of scan. Patient is eating a variety of foods, says "taste buds are back." He endorses 3 meals daily. Patient will occasionally snack on nabs. He is avoiding sweets. This has helped his blood sugars and reduced amount of medications needed for diabetes. Patient reports ongoing swallowing difficulty with breads. This is managed well with taking small sips of water. He continues to have thick saliva in the morning.     Medications: reviewed  Labs: no new labs   Anthropometrics: Last weight 250.1 lb on 3/29   NUTRITION DIAGNOSIS: Inadequate oral intake improved   INTERVENTION:  Continue increasing variety of foods in diet as tolerated Encouraged high protein snacks in between meals     MONITORING, EVALUATION, GOAL: weight trends, intake   NEXT VISIT: No follow-up scheduled. Patient will contact with nutrition questions/concerns

## 2022-02-22 NOTE — Progress Notes (Signed)
Since I will be on vacation next week the patient elected to have a phone call today so that he would know the results of his PET scan ASAP.  He sees Dr. Benay Spice later this month and sees Dr. Constance Holster of ENT in August.  His PET scan shows a complete response to treatment.  I personally reviewed his images and compared his images to previous scans.  He is thrilled with this news.  He knows to follow-up closely with his other providers.  We will schedule a CT scan of the neck and chest with contrast in 9 months and I will see him back at that time for surveillance.  No charge for this encounter.  -----------------------------------  Eppie Gibson, MD

## 2022-02-25 ENCOUNTER — Other Ambulatory Visit: Payer: Self-pay

## 2022-02-25 DIAGNOSIS — C111 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: Secondary | ICD-10-CM

## 2022-03-01 ENCOUNTER — Ambulatory Visit (INDEPENDENT_AMBULATORY_CARE_PROVIDER_SITE_OTHER): Payer: BC Managed Care – PPO | Admitting: Dentistry

## 2022-03-01 ENCOUNTER — Encounter (HOSPITAL_COMMUNITY): Payer: Self-pay | Admitting: Dentistry

## 2022-03-01 VITALS — BP 128/73 | HR 90 | Temp 98.0°F | Wt 220.0 lb

## 2022-03-01 DIAGNOSIS — R634 Abnormal weight loss: Secondary | ICD-10-CM | POA: Diagnosis not present

## 2022-03-01 DIAGNOSIS — Z923 Personal history of irradiation: Secondary | ICD-10-CM

## 2022-03-01 DIAGNOSIS — Y842 Radiological procedure and radiotherapy as the cause of abnormal reaction of the patient, or of later complication, without mention of misadventure at the time of the procedure: Secondary | ICD-10-CM

## 2022-03-01 DIAGNOSIS — R432 Parageusia: Secondary | ICD-10-CM | POA: Diagnosis not present

## 2022-03-01 DIAGNOSIS — K036 Deposits [accretions] on teeth: Secondary | ICD-10-CM | POA: Diagnosis not present

## 2022-03-01 DIAGNOSIS — K117 Disturbances of salivary secretion: Secondary | ICD-10-CM

## 2022-03-01 NOTE — Patient Instructions (Signed)
[facility] Department of Dental Medicine Dr. Annalisia Ingber B. Charlton Boule, D.M.D. Phone: [phone] Fax: [phone]      It was a pleasure seeing you today!  Please refer to the information below regarding your dental visit with us.  Please do not hesitate to give us a call if any questions or concerns come up after you leave.     Thank you for letting us provide care for you.  If there is anything we can do for you, please let us know.    RECOMMENDATIONS: Brush after meals and at bedtime.  Floss once a day, and use fluoride at bedtime. Use trismus exercises as directed below. Use CLOSYs for dry mouth, and salt water/baking soda rinses to help with any mouth sores. Take multiple sips of water as needed.  Stay hydrated. Return to your regular dentist for routine dental care including cleanings and periodic exams.  If you do not have a regular dentist, it is important to establish care at an outside dental office of your choice.  Call us if any problems or concerns arise.    TRISMUS: Trismus is a condition where the jaw does not allow the mouth to open as wide as it usually does.  This can happen almost suddenly, or in other cases the process is so slow, it is hard to notice it-until it is too far along.  When the jaw joints and/or muscles have been exposed to radiation treatments, the onset of trismus is very slow.  This is because the muscles are losing their stretching ability over a long period of time, as long as 2 YEARS after the end of radiation.  It is therefore important to exercise these muscles and joints.  Trismus exercises: Use the stack of tongue depressors measuring the same or a little less than your maximum opening that was recorded at your first dental visit. Place the stack in your mouth, supporting the other end with your hand. Allow 30 seconds for muscle stretching. Rest for a few seconds. Repeat 3-5 times. For all radiation patients,  this exercise is recommended in the mornings and evenings unless otherwise instructed. The exercises should be done for a period of 2 YEARS after the end of radiation. Maximum jaw opening should be checked routinely on recall dental visits by your general dentist. Report any changes, soreness, or difficulties encountered when doing the exercises to your dentist.    Questions?  Call our office during office hours at [phone].  

## 2022-03-01 NOTE — Progress Notes (Signed)
Department of Dental Medicine    Service Date:   03/01/2022  Patient Name:  Dustin [name] Date of Birth:   08-16-1961 Medical Record Number: [id number]  LIMITED EXAM PLAN/RECOMMENDATIONS   ASSESSMENT: Xerostomia, dysgeusia, weight loss, accretions on teeth  RECOMMENDATIONS: Brush after meals & at bedtime. Use fluoride at bedtime. Try using Sensodyne for tooth sensitivity until delivery of lower fluoride tray. Use trismus exercises as directed. Use CLoSYS & warm salt water/baking soda rinses as needed. Take multiple sips of water as needed.  PLAN: Return for lower fluoride tray delivery. Return to primary dentist  for routine dental care including replacement of missing teeth as needed, cleanings/periodontal therapy and exams. If the patient does not have a primary dentist, establish care at an outside office of their choice. Call if any questions or concerns arise.   03/01/2022   HISTORY OF PRESENT ILLNESS: KENNARD FILDES is a 61 y.o. 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   The patient has completed 35 of 35 radiation treatments from 10/03/2021 to 11/20/2021.  They have completed 15 chemotherapy treatments.   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/22/2021    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[place]) 06/12/2021   Chest pain of uncertain etiology    Former smoker 04/18/2017   Primary insomnia 04/14/2017   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   History of positive PPD treated with INH  04/14/2017   Abscess of left olecranon bursa 11/07/2016   High risk medication use 11/07/2016   Cervical disc disease 11/07/2016   Primary osteoarthritis of both hands 11/07/2016   Primary osteoarthritis of both knees 11/07/2016   Primary osteoarthritis of both shoulders 11/07/2016   Precordial pain 01/22/2016   Pain in the chest    Hyperlipidemia 01/20/2016   Depression with anxiety 01/20/2016   GERD (gastroesophageal reflux disease) 01/20/2016   Type 2 diabetes mellitus ([place]) 01/20/2016   Chest pain at rest 09/22/2014   Tobacco use 09/22/2014   Morbid obesity ([place]) 09/22/2014   Rheumatoid arthritis ([place]) 09/22/2014   Family history of heart disease 09/22/2014   Chest pain 09/22/2014   Past Medical History:  Diagnosis Date   Anxiety    COPD (chronic obstructive pulmonary disease) ([place])    Depression    Essential hypertension    GERD (gastroesophageal reflux disease)    H/O hiatal hernia    History of cardiac catheterization    Normal coronaries May 2017   History of ITP    Childhood   Hypothyroidism    Osteoarthritis    Type 2 diabetes mellitus ([place])    no meds   Past Surgical History:  Procedure Laterality Date   ANTERIOR CERVICAL DECOMP/DISCECTOMY FUSION  01/21/2012   Procedure: ANTERIOR CERVICAL DECOMPRESSION/DISCECTOMY FUSION 2 LEVELS;  Surgeon: Erline Levine, MD;  Location: [facility];  Service: Neurosurgery;  Laterality: N/A;  Cervical Six-Seven, Cervical Seven-Thoracic One, Anterior Cervical Decompression with Fusion Interbody Prothesis Plating and Bonegraft possible posterior Cervical Seven-Thoracic One Foraminotomy   CARDIAC CATHETERIZATION  [phone]   normal coronary arteries   CARDIAC CATHETERIZATION N/A 01/22/2016   Procedure: Left Heart  Cath and Coronary Angiography;  Surgeon: Burnell Blanks, MD;  Location: [facility];  Service: Cardiovascular;  Laterality: N/A;   CARPAL TUNNEL RELEASE Bilateral    CERVICAL DISC SURGERY  04   CHOLECYSTECTOMY N/A 10/26/2014   Procedure: LAPAROSCOPIC CHOLECYSTECTOMY;  Surgeon: Jamesetta So, MD;  Location: [facility];  Service: General;  Laterality: N/A;   ESOPHAGOGASTRODUODENOSCOPY N/A 05/03/2016   Procedure: ESOPHAGOGASTRODUODENOSCOPY (EGD);  Surgeon: Rogene Houston, MD;  Location: [facility];  Service: Endoscopy;  Laterality: N/A;  10:30   HERNIA REPAIR Right 60's   IR IMAGING GUIDED PORT INSERTION  06/27/2021   Left elbow bursa removed     NASOPHARYNGOSCOPY N/A 06/04/2021   Procedure: NASAL ENDOSCOPY WITH BIOPSY OF NASOPHARYNX; FROZEN SECTION;  Surgeon: Izora Gala, MD;  Location: [facility];  Service: ENT;  Laterality: N/A;   TONSILLECTOMY  74   Current Outpatient Medications  Medication Sig Dispense Refill   ALPRAZolam (XANAX) 1 MG tablet Take 1 mg by mouth 3 (three) times daily as needed for anxiety.     atorvastatin (LIPITOR) 10 MG tablet Take 10 mg by mouth at bedtime.     Blood Glucose Monitoring Suppl ([place] FLEX SYSTEM) w/Device KIT daily. as directed     buPROPion (WELLBUTRIN SR) 150 MG 12 hr tablet Start one week before quit date. Take 1 tab daily x 3 days, then 1 tab BID thereafter. (Patient not taking: Reported on 12/25/2021) 60 tablet 2   Cholecalciferol (DIALYVITE VITAMIN D 5000 PO) Take 1 capsule by mouth daily.     levothyroxine (SYNTHROID) 50 MCG tablet Take 50 mcg by mouth at bedtime.     lidocaine (XYLOCAINE) 2 % solution Patient: Mix 1part 2% viscous lidocaine, 1part H20. Swish &/or swallow 40mL of diluted mixture, 24min before meals and at bedtime, up to QID (Patient not taking: Reported on 12/25/2021) 200 mL 3   Lidocaine-Aloe Vera (BURN RELIEF/LIDOCAINE/ALOE) 0.5 % GEL Apply to neck as needed for pain/burning sensation. (Patient not taking: Reported  on 12/25/2021) 226 g 2   lidocaine-prilocaine (EMLA) cream Apply 1 application topically as needed. 30 g 0   lisinopril (ZESTRIL) 2.5 MG tablet Take by mouth.     magic mouthwash SOLN Take 5 mLs by mouth 4 (four) times daily as needed for mouth pain. (Patient not taking: Reported on 12/14/2021) 240 mL 0   metFORMIN (GLUCOPHAGE) 500 MG tablet Take 500 mg by mouth 2 (two) times daily with a meal.     methotrexate (RHEUMATREX) 2.5 MG tablet TAKE EIGHT TABLETS BY MOUTH ONCE WEEKLY 96 tablet 0   nystatin (MYCOSTATIN) 100000 UNIT/ML suspension Take 10 mLs by mouth in the morning, at noon, in the evening, and at bedtime. (Patient not taking: Reported on 10/18/2021)     ondansetron (ZOFRAN) 8 MG tablet TAKE 1 TAB BY MOUTH EVERY 8 HOURS AS NEEDED FOR NAUSEA OR VOMITING. START 72 HOURS AFTER IV ALOXI IS GIVEN IN CLINIC (Patient not taking: Reported on 12/25/2021) 24 tablet 2   ONETOUCH VERIO test strip daily.     pantoprazole (PROTONIX) 40 MG tablet Take 40 mg by mouth at bedtime.  prochlorperazine (COMPAZINE) 10 MG tablet TAKE 1 TABLET BY MOUTH EVERY 6 HOURS AS NEEDED FOR NAUSEA OR VOMITING. (Patient not taking: Reported on 12/25/2021) 30 tablet 3   sildenafil (VIAGRA) 25 MG tablet Take 25 mg by mouth daily as needed for erectile dysfunction.     zolpidem (AMBIEN) 10 MG tablet Take 10 mg by mouth at bedtime as needed for sleep. Reported on 01/20/2016     No current facility-administered medications for this visit.   No Known Allergies   VITALS: BP 128/73 (BP Location: Right Arm, Patient Position: Sitting, Cuff Size: Normal)   Pulse 90   Temp 98 F (36.7 C) (Oral)   Wt 220 lb (99.8 kg)   BMI 29.03 kg/m    DENTAL EXAM: ORAL HYGIENE (PLAQUE):  Yes MUCOSITIS:  No DESCRIPTION OF SALIVA:  Thick, viscous saliva EXPOSED BONE:  No OTHER WATCHED AREAS:  No   ASSESSMENT/DIAGNOSES: Patient with history of radiation therapy to the head and neck region.  XEROSTOMIA:  He is using Biotene as needed right  not without much relief.   Recommended trying CLoSYS OTC brand product for dry mouth.  Samples of rinse, spray and toothpaste were given to the patient today as well as some coupons. DYSGEUSIA:  His taste is slowly returning, but some things still taste differently than before.  WEIGHT LOSS:  He lost a significant amount of weight, but he is maintaining adequate nutrition.  He states that he is borderline type 2 diabetic and is encouraged that if he keeps the weight off he will no longer need any medication/insulin.  He also never had a feeding tube. ACCRETIONS: Generalized on remaining teeth.  Encouraged maintaining good oral hygiene including brushing at least twice a day and flossing after meals.  I discussed how he is now susceptible to developing cavities at a much faster rate than before radiation, and that any existing cavities he has will grow much faster as well.  Encouraged he establish care at an outside dental office for routine care including regular cleanings or return to our clinic to be seen as a comprehensive patient.    PROCEDURES: Impression for fluoride tray(s).  Lower alginate impression taken and poured up in [employer] for fabrication of lower fluoride tray.   PLAN AND RECOMMENDATIONS: Brush after meals and at bedtime.  Until we are able to deliver your lower fluoride tray, try using Sensodyne toothpaste to help with sensitivity of teeth. Use trismus exercises as directed. Use CLoSYS as needed for dry mouth and salt water/baking soda rinses as needed. Take multiple sips of water as needed.  Return for lower fluoride tray delivery.  Establish care at an outside dental office of the patient's choice for routine dental care including replacement of missing teeth as needed. Call if any problems or concerns arise before next visit.  All questions and concerns were invited and addressed.  The patient tolerated today's visit well and departed in stable condition.    Charlaine Dalton, D.M.D.

## 2022-03-05 ENCOUNTER — Ambulatory Visit: Payer: Self-pay | Admitting: Radiation Oncology

## 2022-03-05 DIAGNOSIS — Z923 Personal history of irradiation: Secondary | ICD-10-CM | POA: Insufficient documentation

## 2022-03-05 DIAGNOSIS — R432 Parageusia: Secondary | ICD-10-CM | POA: Insufficient documentation

## 2022-03-05 DIAGNOSIS — R634 Abnormal weight loss: Secondary | ICD-10-CM | POA: Insufficient documentation

## 2022-03-05 DIAGNOSIS — K036 Deposits [accretions] on teeth: Secondary | ICD-10-CM | POA: Insufficient documentation

## 2022-03-08 ENCOUNTER — Inpatient Hospital Stay (HOSPITAL_BASED_OUTPATIENT_CLINIC_OR_DEPARTMENT_OTHER): Payer: BC Managed Care – PPO | Admitting: Oncology

## 2022-03-08 ENCOUNTER — Inpatient Hospital Stay: Payer: BC Managed Care – PPO

## 2022-03-08 VITALS — BP 103/63 | HR 87 | Temp 98.2°F | Resp 18 | Ht 73.0 in | Wt 219.0 lb

## 2022-03-08 DIAGNOSIS — Z79899 Other long term (current) drug therapy: Secondary | ICD-10-CM | POA: Diagnosis not present

## 2022-03-08 DIAGNOSIS — Z923 Personal history of irradiation: Secondary | ICD-10-CM | POA: Diagnosis not present

## 2022-03-08 DIAGNOSIS — M069 Rheumatoid arthritis, unspecified: Secondary | ICD-10-CM | POA: Diagnosis not present

## 2022-03-08 DIAGNOSIS — C119 Malignant neoplasm of nasopharynx, unspecified: Secondary | ICD-10-CM

## 2022-03-08 DIAGNOSIS — Z95828 Presence of other vascular implants and grafts: Secondary | ICD-10-CM

## 2022-03-08 DIAGNOSIS — E039 Hypothyroidism, unspecified: Secondary | ICD-10-CM | POA: Diagnosis not present

## 2022-03-08 DIAGNOSIS — Z9221 Personal history of antineoplastic chemotherapy: Secondary | ICD-10-CM | POA: Diagnosis not present

## 2022-03-08 DIAGNOSIS — M21372 Foot drop, left foot: Secondary | ICD-10-CM | POA: Diagnosis not present

## 2022-03-08 DIAGNOSIS — E119 Type 2 diabetes mellitus without complications: Secondary | ICD-10-CM | POA: Diagnosis not present

## 2022-03-08 DIAGNOSIS — J449 Chronic obstructive pulmonary disease, unspecified: Secondary | ICD-10-CM | POA: Diagnosis not present

## 2022-03-08 MED ORDER — HEPARIN SOD (PORK) LOCK FLUSH 100 UNIT/ML IV SOLN
500.0000 [IU] | Freq: Once | INTRAVENOUS | Status: AC
  Administered 2022-03-08: 500 [IU] via INTRAVENOUS

## 2022-03-08 MED ORDER — SODIUM CHLORIDE 0.9% FLUSH
10.0000 mL | Freq: Once | INTRAVENOUS | Status: AC
  Administered 2022-03-08: 10 mL via INTRAVENOUS

## 2022-03-15 DIAGNOSIS — Z6829 Body mass index (BMI) 29.0-29.9, adult: Secondary | ICD-10-CM | POA: Diagnosis not present

## 2022-03-15 DIAGNOSIS — F411 Generalized anxiety disorder: Secondary | ICD-10-CM | POA: Diagnosis not present

## 2022-03-15 DIAGNOSIS — E063 Autoimmune thyroiditis: Secondary | ICD-10-CM | POA: Diagnosis not present

## 2022-03-15 DIAGNOSIS — C113 Malignant neoplasm of anterior wall of nasopharynx: Secondary | ICD-10-CM | POA: Diagnosis not present

## 2022-03-15 DIAGNOSIS — E119 Type 2 diabetes mellitus without complications: Secondary | ICD-10-CM | POA: Diagnosis not present

## 2022-03-29 ENCOUNTER — Encounter (HOSPITAL_COMMUNITY): Payer: BC Managed Care – PPO | Admitting: Dentistry

## 2022-04-04 ENCOUNTER — Telehealth: Payer: Self-pay | Admitting: *Deleted

## 2022-04-04 NOTE — Chronic Care Management (AMB) (Signed)
  Care Coordination  Note  04/04/2022 Name: Dustin [name] MRN: [id number] DOB: September 22, 1960  Dustin [name] is a 61 y.o. year old male who is a primary care patient of [employer], MD. I reached out to Dustin [name] by phone today to offer care coordination services.      Dustin [name] was given information about Care Coordination services today including:  The Care Coordination services include support from the care team which includes your Nurse Coordinator, Clinical Social Worker, or Pharmacist.  The Care Coordination team is here to help remove barriers to the health concerns and goals most important to you. Care Coordination services are voluntary and the patient may decline or stop services at any time by request to their care team member.   Patient agreed to services and verbal consent obtained.   Follow up plan: Telephone appointment with care coordination team member scheduled for:04/05/22 [name]  Direct Dial: [phone]

## 2022-04-05 ENCOUNTER — Ambulatory Visit: Payer: Self-pay | Admitting: *Deleted

## 2022-04-05 ENCOUNTER — Encounter: Payer: Self-pay | Admitting: *Deleted

## 2022-04-05 NOTE — Patient Outreach (Signed)
  Care Coordination   Initial Visit Note   04/05/2022 Name: AYREN ZUMBRO MRN: [id number] DOB: 11-Sep-1961  Dustin [name] is a 61 y.o. year old male who sees Redmond [employer], MD for primary care. I spoke with  Dustin [name] by phone today  What matters to the patients health and wellness today?  History of cancer, now in remission.     Goals Addressed               This Visit's Progress     Remain cancer free (pt-stated)        Care Coordination Interventions: Advised patient to Discuss removal of port. Reviewed scheduled/upcoming provider appointments including port flush on 8/4, Oncology on 9/22.  Last PET scan was June 2023.         SDOH assessments and interventions completed:   Yes   Care Coordination Interventions Activated:  Yes Care Coordination Interventions:  Yes, provided  Follow up plan: No further intervention required.  Encounter Outcome:  Pt. Visit Completed  Valente David, RN, MSN, [place] Manager [phone]

## 2022-04-08 ENCOUNTER — Other Ambulatory Visit: Payer: Self-pay

## 2022-04-12 ENCOUNTER — Encounter: Payer: Self-pay | Admitting: Oncology

## 2022-04-19 ENCOUNTER — Other Ambulatory Visit: Payer: Self-pay | Admitting: *Deleted

## 2022-04-19 ENCOUNTER — Encounter: Payer: Self-pay | Admitting: *Deleted

## 2022-04-19 ENCOUNTER — Inpatient Hospital Stay: Payer: BC Managed Care – PPO | Attending: Nurse Practitioner

## 2022-04-19 ENCOUNTER — Encounter: Payer: Self-pay | Admitting: Oncology

## 2022-04-19 VITALS — BP 95/63 | HR 87 | Temp 98.2°F | Resp 18 | Ht 73.0 in | Wt 215.0 lb

## 2022-04-19 DIAGNOSIS — Z95828 Presence of other vascular implants and grafts: Secondary | ICD-10-CM

## 2022-04-19 DIAGNOSIS — Z452 Encounter for adjustment and management of vascular access device: Secondary | ICD-10-CM | POA: Insufficient documentation

## 2022-04-19 DIAGNOSIS — C119 Malignant neoplasm of nasopharynx, unspecified: Secondary | ICD-10-CM | POA: Diagnosis not present

## 2022-04-19 MED ORDER — HEPARIN SOD (PORK) LOCK FLUSH 100 UNIT/ML IV SOLN
500.0000 [IU] | Freq: Once | INTRAVENOUS | Status: AC
  Administered 2022-04-19: 500 [IU] via INTRAVENOUS

## 2022-04-19 MED ORDER — SODIUM CHLORIDE 0.9% FLUSH
10.0000 mL | Freq: Once | INTRAVENOUS | Status: AC
  Administered 2022-04-19: 10 mL via INTRAVENOUS

## 2022-04-19 NOTE — Patient Instructions (Signed)

## 2022-04-19 NOTE — Progress Notes (Signed)
PATIENT NAVIGATOR PROGRESS NOTE  Name: Dustin [name] Date: 04/19/2022 MRN: [id number]  DOB: Jul 20, 1961   Reason for visit:  Requesting port removal  Comments:  Pt in for PAC flush today and requested for Port removal. Order entered per Dr Benay Spice for port removal    Time spent counseling/coordinating care: 15-30 minutes

## 2022-04-29 ENCOUNTER — Other Ambulatory Visit: Payer: Self-pay | Admitting: Radiology

## 2022-04-30 ENCOUNTER — Encounter (HOSPITAL_COMMUNITY): Payer: Self-pay

## 2022-04-30 ENCOUNTER — Ambulatory Visit (HOSPITAL_COMMUNITY)
Admission: RE | Admit: 2022-04-30 | Discharge: 2022-04-30 | Disposition: A | Discharge status: Home / Self Care | Payer: BC Managed Care – PPO | Source: Ambulatory Visit | Attending: Oncology | Admitting: Oncology

## 2022-04-30 DIAGNOSIS — Z452 Encounter for adjustment and management of vascular access device: Secondary | ICD-10-CM | POA: Insufficient documentation

## 2022-04-30 DIAGNOSIS — Z95828 Presence of other vascular implants and grafts: Secondary | ICD-10-CM

## 2022-04-30 HISTORY — PX: IR REMOVAL TUN ACCESS W/ PORT W/O FL MOD SED: IMG2290

## 2022-04-30 LAB — GLUCOSE, CAPILLARY: Glucose-Capillary: 95 mg/dL (ref 70–99)

## 2022-04-30 MED ORDER — LIDOCAINE HCL 1 % IJ SOLN
INTRAMUSCULAR | Status: AC
  Filled 2022-04-30: qty 20

## 2022-04-30 MED ORDER — SODIUM CHLORIDE 0.9 % IV SOLN: INTRAVENOUS | Status: DC

## 2022-04-30 NOTE — Procedures (Signed)
Interventional Radiology Procedure Note  Procedure: Removal of a right IJ approach single lumen PowerPort.    Complications: None  Recommendations:  - Ok to DC - Do not submerge x 10 days - Routine wound care   Signed,  Dulcy Fanny. Earleen Newport, DO

## 2022-04-30 NOTE — Progress Notes (Signed)
Pt presents for ordered tunneled catheter with port removal. Pt requests to have port removed with local anesthetic instead of sedation.     Narda Rutherford, AGNP-BC 04/30/2022, 12:33 PM

## 2022-04-30 NOTE — Progress Notes (Signed)
Patient post IR did not receive Sedation meds- able to be discharged.

## 2022-04-30 NOTE — Progress Notes (Signed)
In person discussion w/ patient's designated contact - Neoma Laming, she was brought to the bedside. Discussed timeframes for pre/intra and post procedure. Verbalized understanding. Aware that call will be made post procedure to discuss pick up time and any further discharge instructions.

## 2022-04-30 NOTE — Discharge Instructions (Signed)
Please call Interventional Radiology clinic [phone] with any questions or concerns.  You may remove your dressing and shower tomorrow.   Implanted Port Removal, Care After This sheet gives you information about how to care for yourself after your procedure. Your health care provider may also give you more specific instructions. If you have problems or questions, contact your health careprovider. What can I expect after the procedure? After the procedure, it is common to have: Soreness or pain near your incision. Some swelling or bruising near your incision. Follow these instructions at home: Medicines Take over-the-counter and prescription medicines only as told by your health care provider. If you were prescribed an antibiotic medicine, take it as told by your health care provider. Do not stop taking the antibiotic even if you start to feel better. Bathing Do not take baths, swim, or use a hot tub until your health care provider approves. Ask your health care provider if you can take showers. You may only be allowed to take sponge baths. Incision care Follow instructions from your health care provider about how to take care of your incision. Make sure you: Wash your hands with soap and water before you change your bandage (dressing). If soap and water are not available, use hand sanitizer. Change your dressing as told by your health care provider. Keep your dressing dry. Leave  skin glue, or adhesive strips in place. These skin closures may need to stay in place for 2 weeks or longer. If adhesive strip edges start to loosen and curl up, you may trim the loose edges.  Check your incision area every day for signs of infection. Check for:       - More redness, swelling, or pain.       - More fluid or blood.       - Warmth.       - Pus or a bad smell.  Driving Do not drive for 24 hours if you were given a medicine to help you relax (sedative) during your procedure. If you did not  receive a sedative, ask your health care provider when it is safe to drive.  Activity Return to your normal activities as told by your health care provider. Ask your health care provider what activities are safe for you. Do not lift anything that is heavier than 10 lb (4.5 kg), or the limit that you are told, until your health care provider says that it is safe. Do not do activities that involve lifting your arms over your head. General instructions Do not use any products that contain nicotine or tobacco, such as cigarettes and e-cigarettes. These can delay healing. If you need help quitting, ask your health care provider. Keep all follow-up visits as told by your health care provider. This is important. Contact a health care provider if: You have more redness, swelling, or pain around your incision. You have more fluid or blood coming from your incision. Your incision feels warm to the touch. You have pus or a bad smell coming from your incision. You have pain that is not relieved by your pain medicine. Get help right away if you have: A fever or chills. Chest pain. Difficulty breathing. Summary After the procedure, it is common to have pain, soreness, swelling, or bruising near your incision. If you were prescribed an antibiotic medicine, take it as told by your health care provider. Do not stop taking the antibiotic even if you start to feel better. Do not drive for 24 hours  if you were given a sedative during your procedure. Return to your normal activities as told by your health care provider. Ask your health care provider what activities are safe for you. This information is not intended to replace advice given to you by your health care provider. Make sure you discuss any questions you have with your healthcare provider. Document Revised: 10/16/2017 Document Reviewed: 10/16/2017 Elsevier Patient Education  2022 [employer].

## 2022-05-16 ENCOUNTER — Telehealth: Payer: Self-pay | Admitting: *Deleted

## 2022-05-16 NOTE — Patient Outreach (Signed)
  Care Coordination   Follow Up Visit Note   05/16/2022 Name: Dustin [name] MRN: [id number] DOB: 12/29/1960  Dustin [name] is a 61 y.o. year old male who sees [employer]mond [employer], MD for primary care. I spoke with  Dustin [name] by phone today.  What matters to the patients health and wellness today?  Would like a call back to further discuss care coordination services    Goals Addressed               This Visit's Progress     Care Coordination Services        Care Coordination Interventions: Called patient to provide information on [facility] Coordination services. Per chart review, patient was outreached by Valente David, RN Care Coordinator last month. At that time, patient declined services. Patient did request a call back on Tues or Wed of next week because he is driving an [id number] at the moment and will be off on Tues and Wed RNCC to call back on Tues or Wed          SDOH assessments and interventions completed:  No     Care Coordination Interventions Activated:  Yes  Care Coordination Interventions:  Yes, provided   Follow up plan:  RNCC to call back on Wed or Thurs of next week per patient request    Encounter Outcome:  Pt. Visit Completed   Chong Sicilian, BSN, RN-BC [place] / [facility] Pharmacist, community Dial: [phone]

## 2022-05-17 NOTE — Progress Notes (Deleted)
Office Visit Note  Patient: Dustin [name]             Date of Birth: May 21, 1961           MRN: [id number]             PCP: Redmond School, MD Referring: Redmond School, MD Visit Date: 05/31/2022 Occupation: '@GUAROCC'$ @  Subjective:  No chief complaint on file.   History of Present Illness: Dustin [name] is a 61 y.o. male ***   Activities of Daily Living:  Patient reports morning stiffness for *** {minute/hour:19697}.   Patient {ACTIONS;DENIES/REPORTS:21021675::"Denies"} nocturnal pain.  Difficulty dressing/grooming: {ACTIONS;DENIES/REPORTS:21021675::"Denies"} Difficulty climbing stairs: {ACTIONS;DENIES/REPORTS:21021675::"Denies"} Difficulty getting out of chair: {ACTIONS;DENIES/REPORTS:21021675::"Denies"} Difficulty using hands for taps, buttons, cutlery, and/or writing: {ACTIONS;DENIES/REPORTS:21021675::"Denies"}  No Rheumatology ROS completed.   PMFS History:  Patient Active Problem List   Diagnosis Date Noted  . History of radiation to head and neck region 03/05/2022  . Accretions on teeth 03/05/2022  . Loss of weight 03/05/2022  . Dysgeusia 03/05/2022  . 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Dustin [name]) 06/12/2021  . Chest pain of uncertain etiology   . Former smoker 04/18/2017  . Primary insomnia 04/14/2017  . Rheumatoid nodulosis (Dustin [name]) 04/14/2017  . History of positive PPD treated with INH  04/14/2017  . Abscess of left olecranon bursa 11/07/2016  . High risk medication use 11/07/2016  . Cervical disc disease 11/07/2016  . Primary osteoarthritis of both hands 11/07/2016  . Primary osteoarthritis of both knees 11/07/2016  . Primary osteoarthritis of both shoulders 11/07/2016  . Precordial pain 01/22/2016  . Pain in the chest   . Hyperlipidemia 01/20/2016  . Depression with anxiety 01/20/2016  . GERD (gastroesophageal reflux disease) 01/20/2016  . Type 2 diabetes mellitus (Dustin [name]) 01/20/2016  . Chest pain at  rest 09/22/2014  . Tobacco use 09/22/2014  . Morbid obesity (Dustin [name]) 09/22/2014  . Rheumatoid arthritis (Dustin [name]) 09/22/2014  . Family history of heart disease 09/22/2014  . Chest pain 09/22/2014    Past Medical History:  Diagnosis Date  . Anxiety   . COPD (chronic obstructive pulmonary disease) (Dustin [name])   . Depression   . Essential hypertension   . GERD (gastroesophageal reflux disease)   . H/O hiatal hernia   . History of cardiac catheterization    Normal coronaries May 2017  . History of ITP    Childhood  . Hypothyroidism   . Osteoarthritis   . Type 2 diabetes mellitus (HCC)    no meds    Family History  Problem Relation Age of Onset  . Heart disease Mother   . COPD Mother   . Heart attack Father        Sudden death  . Heart attack Sister 23  . Kidney disease Sister   . Heart disease Sister   . Heart attack Brother   . Heart disease Brother   . Diabetes Brother   . Heart attack Brother   . Heart disease Brother 13       CABG  . Heart attack Brother 54   Past Surgical History:  Procedure Laterality Date  . ANTERIOR CERVICAL DECOMP/DISCECTOMY FUSION  01/21/2012   Procedure: ANTERIOR CERVICAL DECOMPRESSION/DISCECTOMY FUSION 2 LEVELS;  Surgeon: Erline Levine, MD;  Location: [facility];  Service: Neurosurgery;  Laterality: N/A;  Cervical Six-Seven, Cervical Seven-Thoracic One, Anterior Cervical Decompression with Fusion Interbody Prothesis Plating and Bonegraft possible posterior Cervical Seven-Thoracic One Foraminotomy  . CARDIAC CATHETERIZATION  [phone]  normal coronary arteries  . CARDIAC CATHETERIZATION N/A 01/22/2016   Procedure: Left Heart Cath and Coronary Angiography;  Surgeon: Burnell Blanks, MD;  Location: [facility];  Service: Cardiovascular;  Laterality: N/A;  . CARPAL TUNNEL RELEASE Bilateral   . CERVICAL DISC SURGERY  04  . CHOLECYSTECTOMY N/A 10/26/2014   Procedure: LAPAROSCOPIC CHOLECYSTECTOMY;  Surgeon: Jamesetta So, MD;  Location: [facility];  Service: General;  Laterality: N/A;  . ESOPHAGOGASTRODUODENOSCOPY N/A 05/03/2016   Procedure: ESOPHAGOGASTRODUODENOSCOPY (EGD);  Surgeon: Rogene Houston, MD;  Location: [facility];  Service: Endoscopy;  Laterality: N/A;  10:30  . HERNIA REPAIR Right 60's  . IR IMAGING GUIDED PORT INSERTION  06/27/2021  . IR REMOVAL TUN ACCESS W/ PORT W/O FL MOD SED  04/30/2022  . Left elbow bursa removed    . NASOPHARYNGOSCOPY N/A 06/04/2021   Procedure: NASAL ENDOSCOPY WITH BIOPSY OF NASOPHARYNX; FROZEN SECTION;  Surgeon: Izora Gala, MD;  Location: [facility];  Service: ENT;  Laterality: N/A;  . TONSILLECTOMY  53   Social History   Social History Narrative  . Not on file   Immunization History  Administered Date(s) Administered  . Moderna Covid-19 Vaccine Bivalent Booster 75yr & up 06/20/2021  . Moderna Sars-Covid-2 Vaccination 12/13/2019, 01/03/2020     Objective: Vital Signs: There were no vitals taken for this visit.   Physical Exam   Musculoskeletal Exam: ***  CDAI Exam: CDAI Score: -- Patient Global: --; Provider Global: -- Swollen: --; Tender: -- Joint Exam 05/31/2022   No joint exam has been documented for this visit   There is currently no information documented on the homunculus. Go to the Rheumatology activity and complete the homunculus joint exam.  Investigation: No additional findings.  Imaging: IR Removal Tun Access W/ Port W/O FL  Result Date: 04/30/2022 INDICATION: 61year old male referred for port catheter removal EXAM: REMOVAL RIGHT IJ VEIN PORT-A-CATH MEDICATIONS: None ANESTHESIA/SEDATION: None FLUOROSCOPY: None COMPLICATIONS: None PROCEDURE: Informed consent was obtained from the patient following an explanation of the procedure, risks, benefits and alternatives. The patient understands, agrees and consents for the procedure. All questions were addressed. A time out was performed prior to the initiation of the procedure. The patient was positioned in the  operation suite in the supine position on a gantry. The previous scar on the right chest was generously infiltrated with 1% lidocaine for local anesthesia. Infiltration of the skin and subcutaneous tissues surrounding the port was performed. Using sharp and blunt dissection, the port apparatus and subcutaneous catheter were removed in their entirety. The port pocket was then closed with interrupted Vicryl layer and a running subcuticular with 4-0 Monocryl. The skin was sealed with Derma bond, with Steri-Strips. A sterile dressing was placed. The patient tolerated the procedure well and remained hemodynamically stable throughout. No complications were encountered and no significant blood loss was encountered. IMPRESSION: Status post right IJ port catheter removal. Signed, JDulcy Fanny WNadene Rubins RPVI [facility] Electronically Signed   By: JCorrie MckusickD.O.   On: 04/30/2022 15:53    Recent Labs: Lab Results  Component Value Date   WBC 4.5 12/25/2021   HGB 12.6 (L) 12/25/2021   PLT 253 12/25/2021   NA 141 12/25/2021   K 4.7 12/25/2021   CL 103 12/25/2021   CO2 26 12/25/2021   GLUCOSE 92 12/25/2021   BUN 19 12/25/2021   CREATININE 0.79 12/25/2021   BILITOT 1.0 12/25/2021   ALKPHOS 48 11/08/2021  AST 19 12/25/2021   ALT 12 12/25/2021   PROT 7.4 12/25/2021   ALBUMIN 4.0 11/08/2021   CALCIUM 9.8 12/25/2021   GFRAA 108 09/29/2020    Speciality Comments: No specialty comments available.  Procedures:  No procedures performed Allergies: Patient has no known allergies.   Assessment / Plan:     Visit Diagnoses: No diagnosis found.  Orders: No orders of the defined types were placed in this encounter.  No orders of the defined types were placed in this encounter.   Face-to-face time spent with patient was *** minutes. Greater than 50% of time was spent in counseling and coordination of care.  Follow-Up Instructions: No  follow-ups on file.   Earnestine Mealing, CMA  Note - This record has been created using Editor, commissioning.  Chart creation errors have been sought, but may not always  have been located. Such creation errors do not reflect on  the standard of medical care.

## 2022-05-31 ENCOUNTER — Ambulatory Visit: Payer: BC Managed Care – PPO | Admitting: Physician Assistant

## 2022-05-31 DIAGNOSIS — M0630 Rheumatoid nodule, unspecified site: Secondary | ICD-10-CM

## 2022-05-31 DIAGNOSIS — M19011 Primary osteoarthritis, right shoulder: Secondary | ICD-10-CM

## 2022-05-31 DIAGNOSIS — M0579 Rheumatoid arthritis with rheumatoid factor of multiple sites without organ or systems involvement: Secondary | ICD-10-CM

## 2022-05-31 DIAGNOSIS — Z79899 Other long term (current) drug therapy: Secondary | ICD-10-CM

## 2022-05-31 DIAGNOSIS — Z8659 Personal history of other mental and behavioral disorders: Secondary | ICD-10-CM

## 2022-05-31 DIAGNOSIS — M170 Bilateral primary osteoarthritis of knee: Secondary | ICD-10-CM

## 2022-05-31 DIAGNOSIS — Z87891 Personal history of nicotine dependence: Secondary | ICD-10-CM

## 2022-05-31 DIAGNOSIS — F5101 Primary insomnia: Secondary | ICD-10-CM

## 2022-05-31 DIAGNOSIS — Z8639 Personal history of other endocrine, nutritional and metabolic disease: Secondary | ICD-10-CM

## 2022-05-31 DIAGNOSIS — Z8719 Personal history of other diseases of the digestive system: Secondary | ICD-10-CM

## 2022-05-31 DIAGNOSIS — M19041 Primary osteoarthritis, right hand: Secondary | ICD-10-CM

## 2022-05-31 DIAGNOSIS — Z9289 Personal history of other medical treatment: Secondary | ICD-10-CM

## 2022-05-31 DIAGNOSIS — C119 Malignant neoplasm of nasopharynx, unspecified: Secondary | ICD-10-CM

## 2022-05-31 DIAGNOSIS — M5090 Cervical disc disorder, unspecified, unspecified cervical region: Secondary | ICD-10-CM

## 2022-06-07 ENCOUNTER — Ambulatory Visit: Payer: Self-pay | Admitting: Oncology

## 2022-06-11 ENCOUNTER — Inpatient Hospital Stay: Payer: BC Managed Care – PPO | Attending: Nurse Practitioner | Admitting: Oncology

## 2022-06-11 VITALS — BP 107/66 | HR 87 | Temp 98.1°F | Resp 20 | Ht 73.0 in | Wt 205.6 lb

## 2022-06-11 DIAGNOSIS — J449 Chronic obstructive pulmonary disease, unspecified: Secondary | ICD-10-CM | POA: Diagnosis not present

## 2022-06-11 DIAGNOSIS — E119 Type 2 diabetes mellitus without complications: Secondary | ICD-10-CM | POA: Insufficient documentation

## 2022-06-11 DIAGNOSIS — M069 Rheumatoid arthritis, unspecified: Secondary | ICD-10-CM | POA: Diagnosis not present

## 2022-06-11 DIAGNOSIS — Z79899 Other long term (current) drug therapy: Secondary | ICD-10-CM | POA: Diagnosis not present

## 2022-06-11 DIAGNOSIS — C119 Malignant neoplasm of nasopharynx, unspecified: Secondary | ICD-10-CM | POA: Insufficient documentation

## 2022-06-11 DIAGNOSIS — E039 Hypothyroidism, unspecified: Secondary | ICD-10-CM | POA: Diagnosis not present

## 2022-06-11 DIAGNOSIS — M21372 Foot drop, left foot: Secondary | ICD-10-CM | POA: Diagnosis not present

## 2022-06-11 DIAGNOSIS — Z923 Personal history of irradiation: Secondary | ICD-10-CM | POA: Insufficient documentation

## 2022-06-11 DIAGNOSIS — D693 Immune thrombocytopenic purpura: Secondary | ICD-10-CM | POA: Insufficient documentation

## 2022-06-11 NOTE — Progress Notes (Signed)
[facility] OFFICE PROGRESS NOTE   Diagnosis: Nasopharyngeal cancer  INTERVAL HISTORY:   Dustin [name] returns as scheduled.  He feels well.  No palpable lymph nodes.  He has mild dryness in the mouth.  Good appetite.  He reports mild numbness at the distal dorsum of the feet.  This does not interfere with activity.  He has occasional "foot drop "when walking.  He has returned to work.  Objective:  Vital signs in last 24 hours:  Blood pressure 107/66, pulse 87, temperature 98.1 F (36.7 C), temperature source Oral, resp. rate 20, height '6\' 1"'$  (1.854 m), weight 205 lb 9.6 oz (93.3 kg), SpO2 98 %.    HEENT: Oropharynx without visible mass, neck without mass Lymphatics: No cervical, supraclavicular, axillary, or inguinal nodes Resp: Lungs clear bilaterally Cardio: Regular rate and rhythm GI: No hepatosplenomegaly Vascular: No leg edema Neuro: Strength is intact with dorsiflexion of the foot bilaterally  Lab Results:  Lab Results  Component Value Date   WBC 4.5 12/25/2021   HGB 12.6 (L) 12/25/2021   HCT 38.5 12/25/2021   MCV 98.5 12/25/2021   PLT 253 12/25/2021   NEUTROABS 3,164 12/25/2021    CMP  Lab Results  Component Value Date   NA 141 12/25/2021   K 4.7 12/25/2021   CL 103 12/25/2021   CO2 26 12/25/2021   GLUCOSE 92 12/25/2021   BUN 19 12/25/2021   CREATININE 0.79 12/25/2021   CALCIUM 9.8 12/25/2021   PROT 7.4 12/25/2021   ALBUMIN 4.0 11/08/2021   AST 19 12/25/2021   ALT 12 12/25/2021   ALKPHOS 48 11/08/2021   BILITOT 1.0 12/25/2021   GFRNONAA >60 11/08/2021   GFRAA 108 09/29/2020   Medications: I have reviewed the patient's current medications.   Assessment/Plan: Nasopharyngeal cancer, presenting with left neck adenopathy MRI cervical spine 04/27/2021-bilateral enlarged upper jugular chain lymph nodes.  Right level 2 node measures 18 mm CT neck 05/25/2021-bilateral nasopharyngeal mass eroding the skull base at the level of the clivus and  inferior sphenoid sinus, enlarged right jugular digastric node into enlarged left level 2 nodes, mild soft tissue density in the inferior left sphenoid sinus-tumor? 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 MRI face 06/19/2021-extensive tumor invasion of the skull base, no trigeminal or other cranial nerve involvement, abnormal bilateral level 2 nodes PET 06/19/2021-decrease size of nasopharyngeal mass compared to 05/25/2021 CT, invasion of the clivus, single bilateral level 2A lymph nodes mildly enlarged and hypermetabolic, no evidence of distant metastatic disease Cycle 1 gemcitabine/carboplatin 07/06/2021, day 8 gemcitabine 07/05/2021 Cycle 2 gemcitabine/carboplatin 07/27/2021 Cycle 3 gemcitabine/carboplatin 08/17/2021 PET scan 09/14/2021-decrease in size and degree of FDG uptake associated with FDG avid posterior nasopharyngeal lesion.  Residual FDG uptake along the midline of the posterior nasopharynx.  Persistent but improved asymmetric increased uptake within the right tonsillar region.  Resolution of previous FDG avid cervical lymph nodes. 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  He will return for an office visit in 3 months.  He will call in the interim for new symptoms.  Dustin [name]  Benay Spice, MD  06/11/2022  8:46 AM

## 2022-06-19 DIAGNOSIS — Z6827 Body mass index (BMI) 27.0-27.9, adult: Secondary | ICD-10-CM | POA: Diagnosis not present

## 2022-06-19 DIAGNOSIS — E663 Overweight: Secondary | ICD-10-CM | POA: Diagnosis not present

## 2022-06-19 DIAGNOSIS — Z23 Encounter for immunization: Secondary | ICD-10-CM | POA: Diagnosis not present

## 2022-06-19 DIAGNOSIS — F411 Generalized anxiety disorder: Secondary | ICD-10-CM | POA: Diagnosis not present

## 2022-06-19 DIAGNOSIS — I10 Essential (primary) hypertension: Secondary | ICD-10-CM | POA: Diagnosis not present

## 2022-06-19 DIAGNOSIS — C113 Malignant neoplasm of anterior wall of nasopharynx: Secondary | ICD-10-CM | POA: Diagnosis not present

## 2022-06-19 DIAGNOSIS — E119 Type 2 diabetes mellitus without complications: Secondary | ICD-10-CM | POA: Diagnosis not present

## 2022-06-19 DIAGNOSIS — K219 Gastro-esophageal reflux disease without esophagitis: Secondary | ICD-10-CM | POA: Diagnosis not present

## 2022-08-05 NOTE — Therapy (Signed)
[facility] [address], [place], [place], [address] Phone: [phone]   Fax:  [phone]  Patient Details  Name: Dustin [name] MRN: [id number] Date of Birth: 11/04/1960 Referring Provider:  Eppie Gibson, MD  Encounter Date: 08/05/2022  SPEECH THERAPY DISCHARGE SUMMARY  Visits from Start of Care: 2  Current functional level related to goals / functional outcomes: Pt declined further ST after his second visit. Goals/plan at last appointment are below:  SLP Short Term Goals - 10/31/21 1627                SLP SHORT TERM GOAL #1    Title pt will complete HEP with rare min A     Period --   visits, for all STGs    Status Achieved          SLP SHORT TERM GOAL #2    Title pt will tell SLP why pt is completing HEP with modified independence in two sessions     Baseline 10-31-21     Time 1     Status On-going          SLP SHORT TERM GOAL #3    Title pt will describe 3 overt s/s aspiration PNA with modified independence     Time 1     Status On-going          SLP SHORT TERM GOAL #4    Title pt will tell SLP how a food journal could hasten return to a more normalized diet     Time 2     Status On-going                     SLP Long Term Goals - 10/31/21 1628                SLP LONG TERM GOAL #1    Title pt will complete HEP with modified independence over two visits     Time 3     Period --   visits, for all LTGs    Status On-going          SLP LONG TERM GOAL #2    Title pt will describe how to modify HEP over time, and the timeline associated with reduction in HEP frequency with modified independence over two sessions     Time 5     Status On-going                     Plan - 10/31/21 1626       Clinical Impression Statement At this time pt swallowing is deemed WNL/WFL with dys III/thin liquids, however pt reports he has had some regular food items withot overt s/sx of pharyngeal difficulty. SLP designed  an individualized HEP for dysphagia and pt completed each exercise on their own with total cues, faded to modified independent. There are no overt s/s aspiration PNA reported by pt at this time, nor were any observed. Data indicate that pt's swallow ability will likely decrease over the course of radiation therapy and could very well decline over time following conclusion of their radiation therapy due to muscle disuse atrophy and/or muscle fibrosis. Pt will cont to need to be seen by SLP in order to assess safety of PO intake, assess the need for recommending any objective swallow assessment, and ensuring pt correctly completes the individualized HEP       Remaining deficits: Unknown - pt not  seen >6 months   Education / Equipment: HEP procedure, late effects head/neck radiation on swallow function   Patient agrees to discharge. Patient goals were partially met. Patient is being discharged due to not returning since the last visit.Marland Kitchen    Warner, [place] 08/05/2022, 9:15 AM  [facility] [address], [place], [place], [address] Phone: [phone]   Fax:  [phone]

## 2022-08-30 ENCOUNTER — Telehealth: Payer: Self-pay | Admitting: *Deleted

## 2022-08-30 NOTE — Telephone Encounter (Signed)
Received request from [facility] [phone]) reporting they received a palliative referral on him from [employer]. Requesting contact phone # for patient. Confirmed w/Dustin [name] that he does not want this referral or his phone to be given to the company.  [employer] was made aware of his request.

## 2022-09-12 ENCOUNTER — Other Ambulatory Visit: Payer: Self-pay | Admitting: Oncology

## 2022-09-24 ENCOUNTER — Inpatient Hospital Stay: Payer: BC Managed Care – PPO | Attending: Oncology | Admitting: Oncology

## 2022-09-24 VITALS — BP 106/68 | HR 87 | Temp 98.1°F | Resp 18 | Ht 73.0 in | Wt 203.6 lb

## 2022-09-24 DIAGNOSIS — J449 Chronic obstructive pulmonary disease, unspecified: Secondary | ICD-10-CM | POA: Diagnosis not present

## 2022-09-24 DIAGNOSIS — Z923 Personal history of irradiation: Secondary | ICD-10-CM | POA: Diagnosis not present

## 2022-09-24 DIAGNOSIS — E039 Hypothyroidism, unspecified: Secondary | ICD-10-CM | POA: Insufficient documentation

## 2022-09-24 DIAGNOSIS — C119 Malignant neoplasm of nasopharynx, unspecified: Secondary | ICD-10-CM

## 2022-09-24 DIAGNOSIS — E119 Type 2 diabetes mellitus without complications: Secondary | ICD-10-CM | POA: Insufficient documentation

## 2022-09-24 DIAGNOSIS — M069 Rheumatoid arthritis, unspecified: Secondary | ICD-10-CM | POA: Insufficient documentation

## 2022-09-24 NOTE — Progress Notes (Signed)
[facility] OFFICE PROGRESS NOTE   Diagnosis: Nasopharynx cancer  INTERVAL HISTORY:   Dustin [name] returns as scheduled.  He feels well.  He relates weight loss to altered taste and dry mouth.  The symptoms have improved.  No palpable mass at the left neck.  No consistent headache.  Foot drop has resolved.  He is working.  He has mild numbness in the feet.  This does not interfere with activity. He is taking methotrexate for rheumatoid arthritis. Objective:  Vital signs in last 24 hours:  Blood pressure 106/68, pulse 87, temperature 98.1 F (36.7 C), temperature source Oral, resp. rate 18, height '6\' 1"'$  (1.854 m), weight 203 lb 9.6 oz (92.4 kg), SpO2 96 %.    HEENT: Oropharynx without visible mass, neck without mass Lymphatics: No cervical, supraclavicular, axillary, or inguinal nodes Resp: Breath sounds with a decrease in breath sounds at the right compared to the left lower posterior chest, no respiratory distress Cardio: Regular rate and rhythm GI: No hepatosplenomegaly Vascular: No leg edema Neuro: Dorsiflexion is intact at the foot bilaterally   Lab Results:  Lab Results  Component Value Date   WBC 4.5 12/25/2021   HGB 12.6 (L) 12/25/2021   HCT 38.5 12/25/2021   MCV 98.5 12/25/2021   PLT 253 12/25/2021   NEUTROABS 3,164 12/25/2021    CMP  Lab Results  Component Value Date   NA 141 12/25/2021   K 4.7 12/25/2021   CL 103 12/25/2021   CO2 26 12/25/2021   GLUCOSE 92 12/25/2021   BUN 19 12/25/2021   CREATININE 0.79 12/25/2021   CALCIUM 9.8 12/25/2021   PROT 7.4 12/25/2021   ALBUMIN 4.0 11/08/2021   AST 19 12/25/2021   ALT 12 12/25/2021   ALKPHOS 48 11/08/2021   BILITOT 1.0 12/25/2021   GFRNONAA >60 11/08/2021   GFRAA 108 09/29/2020    Medications: I have reviewed the patient's current medications.   Assessment/Plan: Nasopharyngeal cancer, presenting with left neck adenopathy MRI cervical spine 04/27/2021-bilateral enlarged upper jugular chain  lymph nodes.  Right level 2 node measures 18 mm CT neck 05/25/2021-bilateral nasopharyngeal mass eroding the skull base at the level of the clivus and inferior sphenoid sinus, enlarged right jugular digastric node into enlarged left level 2 nodes, mild soft tissue density in the inferior left sphenoid sinus-tumor? 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 MRI face 06/19/2021-extensive tumor invasion of the skull base, no trigeminal or other cranial nerve involvement, abnormal bilateral level 2 nodes PET 06/19/2021-decrease size of nasopharyngeal mass compared to 05/25/2021 CT, invasion of the clivus, single bilateral level 2A lymph nodes mildly enlarged and hypermetabolic, no evidence of distant metastatic disease Cycle 1 gemcitabine/carboplatin 07/06/2021, day 8 gemcitabine 07/05/2021 Cycle 2 gemcitabine/carboplatin 07/27/2021 Cycle 3 gemcitabine/carboplatin 08/17/2021 PET scan 09/14/2021-decrease in size and degree of FDG uptake associated with FDG avid posterior nasopharyngeal lesion.  Residual FDG uptake along the midline of the posterior nasopharynx.  Persistent but improved asymmetric increased uptake within the right tonsillar region.  Resolution of previous FDG avid cervical lymph nodes. Radiation to primary site and bilateral neck beginning 10/03/2021, completed 11/20/2021 Concurrent weekly carboplatin beginning 10/04/2021, 6 weekly cycles completed 11/08/2021 ENT exam 01/24/2022-no evidence of disease PET 02/22/2022-no residual asymmetric or focal nasopharyngeal hypermetabolism, no residual tonsillar hypermetabolism no evidence for hypermetabolic lymphadenopathy in the neck, no new site of suspicious hypermetabolism Rheumatoid arthritis Diabetes COPD Tobacco use Hypothyroid Remote history ITP, age 62 Mild left foot drop 03/08/2022-resolved     Disposition: Dustin [name] is  in clinical remission from nasopharyngeal carcinoma.  He is scheduled for surveillance imaging in early  March.  He will see Dr. Isidore Moos in March.  He will return for an office visit in 6 months.  [name], MD  09/24/2022  11:32 AM

## 2022-10-08 DIAGNOSIS — J0100 Acute maxillary sinusitis, unspecified: Secondary | ICD-10-CM | POA: Diagnosis not present

## 2022-10-08 DIAGNOSIS — E119 Type 2 diabetes mellitus without complications: Secondary | ICD-10-CM | POA: Diagnosis not present

## 2022-10-08 DIAGNOSIS — C113 Malignant neoplasm of anterior wall of nasopharynx: Secondary | ICD-10-CM | POA: Diagnosis not present

## 2022-10-08 DIAGNOSIS — I700 Atherosclerosis of aorta: Secondary | ICD-10-CM | POA: Diagnosis not present

## 2022-10-08 DIAGNOSIS — Z9229 Personal history of other drug therapy: Secondary | ICD-10-CM | POA: Diagnosis not present

## 2022-10-08 DIAGNOSIS — E1165 Type 2 diabetes mellitus with hyperglycemia: Secondary | ICD-10-CM | POA: Diagnosis not present

## 2022-10-08 DIAGNOSIS — I10 Essential (primary) hypertension: Secondary | ICD-10-CM | POA: Diagnosis not present

## 2022-10-08 DIAGNOSIS — Z6827 Body mass index (BMI) 27.0-27.9, adult: Secondary | ICD-10-CM | POA: Diagnosis not present

## 2022-10-08 DIAGNOSIS — E663 Overweight: Secondary | ICD-10-CM | POA: Diagnosis not present

## 2022-10-08 DIAGNOSIS — J439 Emphysema, unspecified: Secondary | ICD-10-CM | POA: Diagnosis not present

## 2022-10-08 DIAGNOSIS — K219 Gastro-esophageal reflux disease without esophagitis: Secondary | ICD-10-CM | POA: Diagnosis not present

## 2022-11-12 ENCOUNTER — Ambulatory Visit (HOSPITAL_COMMUNITY)
Admission: RE | Admit: 2022-11-12 | Discharge: 2022-11-12 | Disposition: A | Discharge status: Home / Self Care | Payer: BC Managed Care – PPO | Source: Ambulatory Visit | Attending: Radiation Oncology | Admitting: Radiation Oncology

## 2022-11-12 ENCOUNTER — Encounter (HOSPITAL_COMMUNITY): Payer: Self-pay

## 2022-11-12 DIAGNOSIS — C111 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: Secondary | ICD-10-CM

## 2022-11-12 DIAGNOSIS — J439 Emphysema, unspecified: Secondary | ICD-10-CM | POA: Diagnosis not present

## 2022-11-12 DIAGNOSIS — C760 Malignant neoplasm of head, face and neck: Secondary | ICD-10-CM | POA: Diagnosis not present

## 2022-11-12 DIAGNOSIS — C119 Malignant neoplasm of nasopharynx, unspecified: Secondary | ICD-10-CM | POA: Diagnosis not present

## 2022-11-12 DIAGNOSIS — I700 Atherosclerosis of aorta: Secondary | ICD-10-CM | POA: Diagnosis not present

## 2022-11-12 MED ORDER — IOHEXOL 300 MG/ML  SOLN
75.0000 mL | Freq: Once | INTRAMUSCULAR | Status: AC | PRN
  Administered 2022-11-12: 75 mL via INTRAVENOUS

## 2022-11-12 MED ORDER — SODIUM CHLORIDE (PF) 0.9 % IJ SOLN
INTRAMUSCULAR | Status: AC
  Filled 2022-11-12: qty 50

## 2022-11-12 NOTE — Progress Notes (Signed)
Mr. Cutrer presents to clinic today following completion of radiation therapy for nasopharynx carcinoma. He completed treatment on 11-20-21. Had CT scan to neck/chest w/ contrast on 11/12/2022  Pain issues, if any: Denies Using a feeding tube?: N/A Weight changes, if any: Reports he's proud of being able to keep his weight stable and discontinue his oral hypoglycemic agents  Wt Readings from Last 3 Encounters:  11/19/22 204 lb 3.2 oz (92.6 kg)  09/24/22 203 lb 9.6 oz (92.4 kg)  06/11/22 205 lb 9.6 oz (93.3 kg)   Swallowing issues, if any: Reports he can eat/drink a wide variety. States that breadlike foods are difficult and require him to drink liquids constantly.  Smoking or chewing tobacco? None Using fluoride trays daily? N/A--denies any new mouth sores or dental concerns   Last ENT visit was on: Not since diagnosis. Other notable issues, if any: Continues to deal with decreased saliva production and altered since of taste. 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 Last saw his medical oncologist Dr. Benay Spice on 09/24/2022. Overall reports he feels good and is doing well.

## 2022-11-19 ENCOUNTER — Encounter: Payer: Self-pay | Admitting: Radiation Oncology

## 2022-11-19 ENCOUNTER — Ambulatory Visit
Admission: RE | Admit: 2022-11-19 | Discharge: 2022-11-19 | Disposition: A | Discharge status: Home / Self Care | Payer: BC Managed Care – PPO | Source: Ambulatory Visit | Attending: Radiation Oncology | Admitting: Radiation Oncology

## 2022-11-19 VITALS — BP 107/72 | HR 81 | Temp 97.8°F | Resp 20 | Ht 73.0 in | Wt 204.2 lb

## 2022-11-19 DIAGNOSIS — Z79899 Other long term (current) drug therapy: Secondary | ICD-10-CM | POA: Insufficient documentation

## 2022-11-19 DIAGNOSIS — I700 Atherosclerosis of aorta: Secondary | ICD-10-CM | POA: Diagnosis not present

## 2022-11-19 DIAGNOSIS — Z85819 Personal history of malignant neoplasm of unspecified site of lip, oral cavity, and pharynx: Secondary | ICD-10-CM | POA: Insufficient documentation

## 2022-11-19 DIAGNOSIS — Z923 Personal history of irradiation: Secondary | ICD-10-CM | POA: Insufficient documentation

## 2022-11-19 DIAGNOSIS — J432 Centrilobular emphysema: Secondary | ICD-10-CM | POA: Diagnosis not present

## 2022-11-19 DIAGNOSIS — C119 Malignant neoplasm of nasopharynx, unspecified: Secondary | ICD-10-CM

## 2022-11-19 DIAGNOSIS — I2510 Atherosclerotic heart disease of native coronary artery without angina pectoris: Secondary | ICD-10-CM | POA: Diagnosis not present

## 2022-11-19 DIAGNOSIS — C111 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: Secondary | ICD-10-CM

## 2022-11-19 DIAGNOSIS — I6523 Occlusion and stenosis of bilateral carotid arteries: Secondary | ICD-10-CM | POA: Insufficient documentation

## 2022-11-19 DIAGNOSIS — N200 Calculus of kidney: Secondary | ICD-10-CM | POA: Diagnosis not present

## 2022-11-19 DIAGNOSIS — K449 Diaphragmatic hernia without obstruction or gangrene: Secondary | ICD-10-CM | POA: Diagnosis not present

## 2022-11-19 DIAGNOSIS — Z79890 Hormone replacement therapy (postmenopausal): Secondary | ICD-10-CM | POA: Insufficient documentation

## 2022-11-19 MED ORDER — OXYMETAZOLINE HCL 0.05 % NA SOLN
2.0000 | Freq: Once | NASAL | Status: AC
  Administered 2022-11-19: 2 via NASAL
  Filled 2022-11-19: qty 30

## 2022-11-19 NOTE — Progress Notes (Signed)
Radiation Oncology         [phone] ________________________________  Name: Dustin [name] MRN: [id number]  Date: 11/19/2022  DOB: 12-02-1960  Follow-Up Visit Note  CC: Redmond School, MD  Ladell Pier, MD  Diagnosis and Prior Radiotherapy:       ICD-10-CM   1. 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              Pain issues, if any: Denies Using a feeding tube?: N/A Weight changes, if any: Reports he's proud of being able to keep his weight stable and discontinue his oral hypoglycemic agents     Wt Readings from Last 3 Encounters:  11/19/22 204 lb 3.2 oz (92.6 kg)  09/24/22 203 lb 9.6 oz (92.4 kg)  06/11/22 205 lb 9.6 oz (93.3 kg)    Swallowing issues, if any: Reports he can eat/drink a wide variety. States that breadlike foods are difficult and require him to drink liquids constantly.  Smoking or chewing tobacco? None Using fluoride trays daily? N/A--denies any new mouth sores or dental concerns   Last ENT visit was on: Not since diagnosis. Other notable issues, if any: Continues to deal with decreased saliva production and altered since of taste. 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 Last saw his medical oncologist Dr. Benay Spice on 09/24/2022. Overall reports he feels good and is doing well.   ALLERGIES:  has No Known  Allergies.  Meds: Current Outpatient Medications  Medication Sig Dispense Refill   ALPRAZolam (XANAX) 1 MG tablet Take 1 mg by mouth 3 (three) times daily as needed for anxiety.     atorvastatin (LIPITOR) 10 MG tablet Take 10 mg by mouth at bedtime.     Blood Glucose Monitoring Suppl ([place] FLEX SYSTEM) w/Device KIT daily. as directed     folic acid (FOLVITE) 1 MG tablet Take 1 mg by mouth 2 (two) times daily.     levothyroxine (SYNTHROID) 50 MCG tablet Take 50 mcg by mouth at bedtime.     methotrexate (RHEUMATREX) 2.5 MG tablet TAKE EIGHT TABLETS BY MOUTH ONCE WEEKLY 96 tablet 0   Multiple Vitamin (MULTIVITAMIN) tablet Take 1 tablet by mouth daily.     ONETOUCH VERIO test strip daily.     pantoprazole (PROTONIX) 40 MG tablet Take 40 mg by mouth at bedtime.     sildenafil (VIAGRA) 25 MG tablet Take 25 mg by mouth daily as needed for erectile dysfunction.     No current facility-administered medications for this encounter.    Physical Findings: The patient is in no acute distress. Patient is alert and oriented. Wt Readings from Last 3 Encounters:  11/19/22 204 lb 3.2 oz (92.6 kg)  09/24/22 203 lb 9.6 oz (92.4 kg)  06/11/22 205 lb 9.6 oz (93.3 kg)    height is '6\' 1"'$  (  1.854 m) and weight is 204 lb 3.2 oz (92.6 kg). His temperature is 97.8 F (36.6 C). His blood pressure is 107/72 and his pulse is 81. His respiration is 20 and oxygen saturation is 98%. .  General: Alert and oriented, in no acute distress  HEENT: Head is normocephalic. Extraocular movements are intact. Oropharynx is notable for no lesions Neck: Neck is notable for no palpable masses.  Skin is smooth.  Skin: Skin in treatment fields shows satisfactory healing throughout the neck Cardiovascular: Regular in rate and rhythm, no murmurs, clicks, or gallops aucultated Lungs: CTA in all lung fields Abdomen: Soft, nontender, nondistended, with no rigidity or guarding. Lymphatics: see Neck Exam Psychiatric: Judgment  and insight are intact. Affect is appropriate.  PROCEDURE NOTE: After obtaining consent and spraying nasal cavity with topical oxymetazoline, the flexible endoscope was coated with lidocaine gel and introduced and passed through the nasal cavity.  The nasopharynx, oropharynx, hypopharynx, and larynx  were then examined. No lesions appreciated in the mucosal axis.  The true cords were symmetrically mobile. No evidence of disease recurrence visualized.     Lab Findings: Lab Results  Component Value Date   WBC 4.5 12/25/2021   HGB 12.6 (L) 12/25/2021   HCT 38.5 12/25/2021   MCV 98.5 12/25/2021   PLT 253 12/25/2021    Lab Results  Component Value Date   TSH 2.866 09/24/2021    Radiographic Findings: CT Soft Tissue Neck W Contrast  Result Date: 11/13/2022 CLINICAL DATA:  Head/neck cancer, monitor. Clinical remission from nasopharyngeal carcinoma. EXAM: CT NECK WITH CONTRAST TECHNIQUE: Multidetector CT imaging of the neck was performed using the standard protocol following the bolus administration of intravenous contrast. RADIATION DOSE REDUCTION: This exam was performed according to the departmental dose-optimization program which includes automated exposure control, adjustment of the mA and/or kV according to patient size and/or use of iterative reconstruction technique. CONTRAST:  12m OMNIPAQUE IOHEXOL 300 MG/ML  SOLN COMPARISON:  Neck CT 05/25/2021. FINDINGS: Pharynx and larynx: Marked decrease in the size of the previously seen nasopharyngeal mass. Mild residual mucosal thickening, likely secondary to treatment changes. Salivary glands: No inflammation, mass, or stone. Thyroid: Normal. Lymph nodes: Complete resolution of previously seen bilateral level 2 lymphadenopathy. No newly enlarged cervical lymph nodes. Vascular: Atherosclerotic calcifications of the carotid bulbs. Limited intracranial: Unremarkable. Visualized orbits: Unremarkable. Mastoids and visualized paranasal sinuses:  Well-aerated. Skeleton: Unchanged postoperative appearance from prior C3-C5 and C6-T1 ACDF. No suspicious bone lesions. Upper chest: Unchanged severe emphysema. Other: None. IMPRESSION: 1. Marked decrease in the size of the previously seen nasopharyngeal mass. Mild residual mucosal thickening, likely secondary to treatment changes. 2. Complete resolution of previously seen bilateral level 2 lymphadenopathy. No newly enlarged cervical lymph nodes. Electronically Signed   By: WEmmit AlexandersM.D.   On: 11/13/2022 13:09   CT Chest W Contrast  Result Date: 11/13/2022 CLINICAL DATA:  Nasopharyngeal carcinoma surveillance * Tracking Code: BO * EXAM: CT CHEST WITH CONTRAST TECHNIQUE: Multidetector CT imaging of the chest was performed during intravenous contrast administration. RADIATION DOSE REDUCTION: This exam was performed according to the departmental dose-optimization program which includes automated exposure control, adjustment of the mA and/or kV according to patient size and/or use of iterative reconstruction technique. CONTRAST:  767mOMNIPAQUE IOHEXOL 300 MG/ML  SOLN COMPARISON:  PET-CT, 02/22/2022 FINDINGS: Cardiovascular: Aortic atherosclerosis. Normal heart size. Left coronary artery calcifications. No pericardial effusion. Mediastinum/Nodes: No enlarged mediastinal, hilar, or axillary lymph nodes. Small hiatal hernia. Thyroid gland, trachea, and esophagus demonstrate no  significant findings. Lungs/Pleura: Moderate to severe centrilobular and paraseptal emphysema. Mild diffuse bilateral bronchial wall thickening. No pleural effusion or pneumothorax. Upper Abdomen: No acute abnormality. Nonobstructive calculus of the midportion of the right kidney. Musculoskeletal: No chest wall abnormality. No acute osseous findings. IMPRESSION: 1. No evidence of metastatic disease in the chest. 2. Emphysema and mild diffuse bilateral bronchial wall thickening. 3. Coronary artery disease. 4. Nonobstructive right  nephrolithiasis. Aortic Atherosclerosis (ICD10-I70.0) and Emphysema (ICD10-J43.9). Electronically Signed   By: Delanna Ahmadi M.D.   On: 11/13/2022 12:03    Impression/Plan:    1) Head and Neck Cancer Status:  I personally reviewed his recent imaging and it shows no evidence of disease recurrence.  He is in remission.  2) Nutritional Status: No active issues. Patient's weight is stable. Wt Readings from Last 3 Encounters:  11/19/22 204 lb 3.2 oz (92.6 kg)  09/24/22 203 lb 9.6 oz (92.4 kg)  06/11/22 205 lb 9.6 oz (93.3 kg)  PEG tube: None  3) Risk Factors: The patient has been educated about risk factors including alcohol and tobacco abuse; they understand that avoidance of alcohol and tobacco is important to prevent recurrences as well as other cancers. He continues to smoke on and off but states he may work towards quitting.   4) Swallowing: Good function. Patient continues to have difficulty with dry foods like crackers and bread due to decreased saliva. He is continually drinking water to help combat this.   5) Dental: Encouraged to continue regular followup with dentistry, and dental hygiene including fluoride rinses.  Patient is not currently seeing a dentist but he was advised to see his community dentist every 4 months for cleanings as he is at risk for cavities given his dry mouth. All of his tooth roots received less than 50 Gy. I explained to the patient that I would like to talk to his dentist about this in detail if he proceeds with full tooth extractions in the future.  6) Thyroid function: Patient is on levothyroxine and his PCP is checking his TSH annually. Lab Results  Component Value Date   TSH 2.866 09/24/2021    7) Patient will see Dr. Constance Holster in 6 months for follow-up laryngoscope. I will see patient back in 1 year for follow-up. Patient will receive routine CT scan of the neck and chest  with contrast 1-2 days prior to this visit.  On date of service, in total, I spent 30  minutes on this encounter. Patient was seen in person. _____________________________________   Leona Singleton, PA   Eppie Gibson, MD

## 2022-11-19 NOTE — Progress Notes (Addendum)
Oncology Nurse Navigator Documentation   I met with Dustin [name] before his follow up appointment with Dr. Isidore Moos today. He is doing well, able to eat most foods except breads and some meats without moistening it to help swallow. 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 He will see Dr. Benay Spice again in July. He knows to call me for any questions or needs in the future.  At the request of Dr. Isidore Moos I sent a fax to [facility] Scheduling with request that Dustin [name] be contacted and scheduled for routine post-RT follow-up in 6 months with Dr. Constance Holster.   Notification of successful fax transmission received.   Harlow Asa RN, BSN, OCN Head & Neck Oncology Nurse [place] [facility] Phone # [phone]  Fax # [phone]

## 2022-11-20 ENCOUNTER — Other Ambulatory Visit: Payer: Self-pay | Admitting: Radiation Oncology

## 2022-11-20 DIAGNOSIS — C119 Malignant neoplasm of nasopharynx, unspecified: Secondary | ICD-10-CM

## 2022-11-29 ENCOUNTER — Other Ambulatory Visit: Payer: Self-pay | Admitting: Radiology

## 2022-11-29 DIAGNOSIS — C119 Malignant neoplasm of nasopharynx, unspecified: Secondary | ICD-10-CM

## 2023-01-14 DIAGNOSIS — I700 Atherosclerosis of aorta: Secondary | ICD-10-CM | POA: Diagnosis not present

## 2023-01-14 DIAGNOSIS — C113 Malignant neoplasm of anterior wall of nasopharynx: Secondary | ICD-10-CM | POA: Diagnosis not present

## 2023-01-14 DIAGNOSIS — Z6826 Body mass index (BMI) 26.0-26.9, adult: Secondary | ICD-10-CM | POA: Diagnosis not present

## 2023-01-14 DIAGNOSIS — E663 Overweight: Secondary | ICD-10-CM | POA: Diagnosis not present

## 2023-01-14 DIAGNOSIS — Z9229 Personal history of other drug therapy: Secondary | ICD-10-CM | POA: Diagnosis not present

## 2023-01-14 DIAGNOSIS — I10 Essential (primary) hypertension: Secondary | ICD-10-CM | POA: Diagnosis not present

## 2023-01-14 DIAGNOSIS — K219 Gastro-esophageal reflux disease without esophagitis: Secondary | ICD-10-CM | POA: Diagnosis not present

## 2023-01-14 DIAGNOSIS — R7303 Prediabetes: Secondary | ICD-10-CM | POA: Diagnosis not present

## 2023-01-21 IMAGING — PT NM PET TUM IMG INITIAL (PI) SKULL BASE T - THIGH
1 of 7 series · 1 of 25 positions shown · non-contrast
Comparison: CT neck 05/25/2021

CLINICAL DATA: Initial treatment strategy for nasopharyngeal
malignancy.

EXAM:
NUCLEAR MEDICINE PET SKULL BASE TO THIGH
TECHNIQUE: 13.2 mCi F-18 FDG was injected intravenously. Full-ring PET imaging
was performed from the skull base to thigh after the radiotracer. CT
data was obtained and used for attenuation correction and anatomic
localization.
Fasting blood glucose: 143 mg/dl

[Series 3: pet hn_sk_thigh ac · axial · 5.0mm · 4.07mm/px · 1 of 242 slices shown]
[im 145/242]
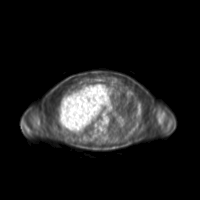

[1 of 25 positions shown; findings below may reference images not displayed]

FINDINGS: Mediastinal blood pool activity: SUV max

Liver activity: SUV max

NECK: The posterior nasopharyngeal mass partially invading the
clivus has a maximum SUV of 12.5, compatible with malignancy. This
is fairly bilaterally symmetric at this time. The anterior-posterior
thickness of this mass is 1.8 cm on image 9 of series 4, previously
2.5 cm on 05/25/2021.

A right level IIa lymph node measuring 1.1 cm in short axis on image
21 series 4 has maximum SUV of 10.2. This previously measured 1.2 cm
in short axis on 05/25/2021.

A left level IIa lymph node measures 1.1 cm in short axis on image
23 series 4 with maximum SUV 7.4. This previously measured 1.1 cm in
short axis on 05/25/2021.

There some small left level IIb lymph nodes. A left level V lymph
node measuring 0.4 cm in short axis on image 29 series 4 has a
maximum SUV of 2.0.

Incidental CT findings: none

CHEST: No significant abnormal hypermetabolic activity in this
region.

Incidental CT findings: Mild atherosclerotic calcification of the
aortic arch and branch vessels. Mild mitral valve calcification.
Centrilobular emphysema.

ABDOMEN/PELVIS: Multiple segments of accentuated bowel activity
without CT abnormality, hence considered physiologic.

Incidental CT findings: Cholecystectomy. Nonobstructive right
nephrolithiasis. Atherosclerosis is present, including aortoiliac
atherosclerotic disease. Sigmoid colon diverticulosis. Activity in
the right hand is injection related.

SKELETON: As noted above, there is sclerosis and some irregularity
in the clivus likely representing invasion of the posterior
nasopharyngeal mass, not independently measurable separate from the
posterior nasopharyngeal mass.

Incidental CT findings: Postoperative findings in the lower cervical
and upper thoracic spine.
IMPRESSION: 1. Substantially reduced size of the posterior nasopharyngeal mass.
Current maximum SUV is 12.5, compatible with malignancy. There still
adjacent sclerosis and irregularity in the clivus probably from some
degree of local invasion.
2. Bilateral single level IIa lymph nodes are mildly enlarged and
hypermetabolic as detailed above, compatible with malignant
involvement.
3. No findings of metastatic disease to the chest, abdomen, or
pelvis.
4. Other imaging findings of potential clinical significance: Aortic
Atherosclerosis ([id number]). Mild mitral valve calcifications.
Emphysema ([id number]). Nonobstructive right nephrolithiasis.
Sigmoid colon diverticulosis.

## 2023-03-04 ENCOUNTER — Ambulatory Visit: Payer: BC Managed Care – PPO | Admitting: Internal Medicine

## 2023-03-26 ENCOUNTER — Inpatient Hospital Stay: Payer: BC Managed Care – PPO | Admitting: Oncology

## 2023-04-15 ENCOUNTER — Encounter: Payer: Self-pay | Admitting: Nurse Practitioner

## 2023-04-15 ENCOUNTER — Ambulatory Visit: Payer: BC Managed Care – PPO | Admitting: Internal Medicine

## 2023-04-15 ENCOUNTER — Inpatient Hospital Stay: Payer: BC Managed Care – PPO | Attending: Oncology | Admitting: Nurse Practitioner

## 2023-04-15 VITALS — BP 105/62 | HR 83 | Temp 98.1°F | Resp 18 | Ht 73.0 in | Wt 195.6 lb

## 2023-04-15 DIAGNOSIS — E063 Autoimmune thyroiditis: Secondary | ICD-10-CM | POA: Diagnosis not present

## 2023-04-15 DIAGNOSIS — D693 Immune thrombocytopenic purpura: Secondary | ICD-10-CM | POA: Diagnosis not present

## 2023-04-15 DIAGNOSIS — M069 Rheumatoid arthritis, unspecified: Secondary | ICD-10-CM | POA: Diagnosis not present

## 2023-04-15 DIAGNOSIS — E538 Deficiency of other specified B group vitamins: Secondary | ICD-10-CM | POA: Diagnosis not present

## 2023-04-15 DIAGNOSIS — E039 Hypothyroidism, unspecified: Secondary | ICD-10-CM | POA: Diagnosis not present

## 2023-04-15 DIAGNOSIS — Z6826 Body mass index (BMI) 26.0-26.9, adult: Secondary | ICD-10-CM | POA: Diagnosis not present

## 2023-04-15 DIAGNOSIS — Z923 Personal history of irradiation: Secondary | ICD-10-CM | POA: Diagnosis not present

## 2023-04-15 DIAGNOSIS — E559 Vitamin D deficiency, unspecified: Secondary | ICD-10-CM | POA: Diagnosis not present

## 2023-04-15 DIAGNOSIS — Z0001 Encounter for general adult medical examination with abnormal findings: Secondary | ICD-10-CM | POA: Diagnosis not present

## 2023-04-15 DIAGNOSIS — E119 Type 2 diabetes mellitus without complications: Secondary | ICD-10-CM | POA: Diagnosis not present

## 2023-04-15 DIAGNOSIS — Z9229 Personal history of other drug therapy: Secondary | ICD-10-CM | POA: Diagnosis not present

## 2023-04-15 DIAGNOSIS — I10 Essential (primary) hypertension: Secondary | ICD-10-CM | POA: Diagnosis not present

## 2023-04-15 DIAGNOSIS — C119 Malignant neoplasm of nasopharynx, unspecified: Secondary | ICD-10-CM | POA: Insufficient documentation

## 2023-04-15 DIAGNOSIS — E663 Overweight: Secondary | ICD-10-CM | POA: Diagnosis not present

## 2023-04-15 DIAGNOSIS — Z79899 Other long term (current) drug therapy: Secondary | ICD-10-CM | POA: Diagnosis not present

## 2023-04-15 NOTE — Progress Notes (Signed)
[facility] OFFICE PROGRESS NOTE   Diagnosis: Nasopharynx cancer  INTERVAL HISTORY:   Dustin [name] returns as scheduled.  He feels well.  Describes appetite as "fine".  States he is maintaining his weight.  Alteration in taste is better.  He has improved saliva production.  He denies pain.  No dysphagia.  Objective:  Vital signs in last 24 hours:  Blood pressure 105/62, pulse 83, temperature 98.1 F (36.7 C), temperature source Oral, resp. rate 18, height 6\' 1"  (1.854 m), weight 195 lb 9.6 oz (88.7 kg), SpO2 100%.    HEENT: Oropharynx without visible mass.  Neck without mass. Lymphatics: No palpable cervical, supraclavicular, axillary or inguinal lymph nodes. Resp: Lungs clear bilaterally.  Breath sounds mildly diminished at the right lower lung field.  No respiratory distress. Cardio: Regular rate and rhythm. GI: No hepatosplenomegaly. Vascular: No leg edema.   Lab Results:  Lab Results  Component Value Date   WBC 4.5 12/25/2021   HGB 12.6 (L) 12/25/2021   HCT 38.5 12/25/2021   MCV 98.5 12/25/2021   PLT 253 12/25/2021   NEUTROABS 3,164 12/25/2021    Imaging:  No results found.  Medications: I have reviewed the patient's current medications.  Assessment/Plan: Nasopharyngeal cancer, presenting with left neck adenopathy MRI cervical spine 04/27/2021-bilateral enlarged upper jugular chain lymph nodes.  Right level 2 node measures 18 mm CT neck 05/25/2021-bilateral nasopharyngeal mass eroding the skull base at the level of the clivus and inferior sphenoid sinus, enlarged right jugular digastric node into enlarged left level 2 nodes, mild soft tissue density in the inferior left sphenoid sinus-tumor? 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 MRI face 06/19/2021-extensive tumor invasion of the skull base, no trigeminal or other cranial nerve involvement, abnormal bilateral level 2 nodes PET 06/19/2021-decrease size of nasopharyngeal mass  compared to 05/25/2021 CT, invasion of the clivus, single bilateral level 2A lymph nodes mildly enlarged and hypermetabolic, no evidence of distant metastatic disease Cycle 1 gemcitabine/carboplatin 07/06/2021, day 8 gemcitabine 07/05/2021 Cycle 2 gemcitabine/carboplatin 07/27/2021 Cycle 3 gemcitabine/carboplatin 08/17/2021 PET scan 09/14/2021-decrease in size and degree of FDG uptake associated with FDG avid posterior nasopharyngeal lesion.  Residual FDG uptake along the midline of the posterior nasopharynx.  Persistent but improved asymmetric increased uptake within the right tonsillar region.  Resolution of previous FDG avid cervical lymph nodes. Radiation to primary site and bilateral neck beginning 10/03/2021, completed 11/20/2021 Concurrent weekly carboplatin beginning 10/04/2021, 6 weekly cycles completed 11/08/2021 ENT exam 01/24/2022-no evidence of disease PET 02/22/2022-no residual asymmetric or focal nasopharyngeal hypermetabolism, no residual tonsillar hypermetabolism no evidence for hypermetabolic lymphadenopathy in the neck, no new site of suspicious hypermetabolism CTs 11/13/2022 neck and chest-marked decrease in the size of the previously seen nasopharyngeal mass.  Mild residual mucosal thickening likely secondary to treatment changes.  Complete resolution of previously seen bilateral level 2 lymphadenopathy.  No newly enlarged cervical lymph nodes.  No evidence of metastatic disease in the chest.  Emphysema and mild diffuse bilateral bronchial wall thickening.  CAD. 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  He last saw Dr. Basilio [name] 11/19/2022.  He understands her recommendation for follow-up with Dr. Pollyann [name], ENT.  I encouraged him to schedule that visit.  He would like to speak with Dr. Karoline [name] office first.  I sent a message to the  nurse navigator.  He will return for follow-up here in 6 months.  Dustin [name] ANP/GNP-BC   04/15/2023  10:55 AM

## 2023-04-16 ENCOUNTER — Encounter: Payer: Self-pay | Admitting: *Deleted

## 2023-04-16 ENCOUNTER — Encounter: Payer: Self-pay | Admitting: Internal Medicine

## 2023-04-16 ENCOUNTER — Ambulatory Visit: Payer: BC Managed Care – PPO | Attending: Internal Medicine | Admitting: Internal Medicine

## 2023-04-16 VITALS — BP 100/68 | HR 76 | Ht 73.0 in | Wt 196.8 lb

## 2023-04-16 DIAGNOSIS — I2510 Atherosclerotic heart disease of native coronary artery without angina pectoris: Secondary | ICD-10-CM

## 2023-04-16 DIAGNOSIS — R072 Precordial pain: Secondary | ICD-10-CM | POA: Diagnosis not present

## 2023-04-16 DIAGNOSIS — I2584 Coronary atherosclerosis due to calcified coronary lesion: Secondary | ICD-10-CM

## 2023-04-16 NOTE — Progress Notes (Signed)
Received a message from [name], NP who saw Dustin [name] yesterday, asking that I call him to explain need for follow up appointment with Dr. Pollyann Kennedy.  Per progress note on 11/19/2022, Dr. Basilio Cairo requested that patient complete routine post radiation treatment follow up with Dr. Pollyann Kennedy in 6 months.  I called patient to explain the purpose of the appointment.  Patient verbalized understanding and denied any other questions.  I encouraged him to call if he has additional questions.

## 2023-04-16 NOTE — Patient Instructions (Signed)

## 2023-04-30 DIAGNOSIS — I2510 Atherosclerotic heart disease of native coronary artery without angina pectoris: Secondary | ICD-10-CM | POA: Insufficient documentation

## 2023-04-30 NOTE — Progress Notes (Signed)
Cardiology Office Note  Date: 04/30/2023   ID: Dustin [name], DOB 1961-02-04, MRN [id number]  PCP:  Elfredia Nevins, MD  Cardiologist:  Marjo Bicker, MD Electrophysiologist:  None   History of Present Illness: Dustin [name] is a 62 y.o. male known to have HTN, DM 2 not on meds, was referred to cardiology clinic for imaging evidence of coronary artery calcifications.  LHC from 2017 showed angiographically normal coronaries. NST from 2010, 2016 and 2022 showed no evidence of ischemia. Echo from 02/2021 showed normal LVEF, indeterminate diastology, normal RV EF, mild MR.  Patient has chest pains only when he has panic attack but otherwise denies any angina.  No DOE, orthopnea, PND, palpitations, syncope or leg swelling.  He underwent CT chest with contrast in 10/2022 that showed left coronary artery calcifications.  Past Medical History:  Diagnosis Date   Anxiety    COPD (chronic obstructive pulmonary disease) (HCC)    Depression    Essential hypertension    GERD (gastroesophageal reflux disease)    H/O hiatal hernia    History of cardiac catheterization    Normal coronaries May 2017   History of ITP    Childhood   Hypothyroidism    Osteoarthritis    Type 2 diabetes mellitus (HCC)    no meds    Past Surgical History:  Procedure Laterality Date   ANTERIOR CERVICAL DECOMP/DISCECTOMY FUSION  01/21/2012   Procedure: ANTERIOR CERVICAL DECOMPRESSION/DISCECTOMY FUSION 2 LEVELS;  Surgeon: Maeola Harman, MD;  Location: [facility];  Service: Neurosurgery;  Laterality: N/A;  Cervical Six-Seven, Cervical Seven-Thoracic One, Anterior Cervical Decompression with Fusion Interbody Prothesis Plating and Bonegraft possible posterior Cervical Seven-Thoracic One Foraminotomy   CARDIAC CATHETERIZATION  [phone]   normal coronary arteries   CARDIAC CATHETERIZATION N/A 01/22/2016   Procedure: Left Heart Cath and Coronary Angiography;  Surgeon: Kathleene Hazel, MD;  Location: [facility]  INVASIVE CV LAB;  Service: Cardiovascular;  Laterality: N/A;   CARPAL TUNNEL RELEASE Bilateral    CERVICAL DISC SURGERY  04   CHOLECYSTECTOMY N/A 10/26/2014   Procedure: LAPAROSCOPIC CHOLECYSTECTOMY;  Surgeon: Dalia Heading, MD;  Location: [facility];  Service: General;  Laterality: N/A;   ESOPHAGOGASTRODUODENOSCOPY N/A 05/03/2016   Procedure: ESOPHAGOGASTRODUODENOSCOPY (EGD);  Surgeon: Malissa Hippo, MD;  Location: [facility];  Service: Endoscopy;  Laterality: N/A;  10:30   HERNIA REPAIR Right 60's   IR IMAGING GUIDED PORT INSERTION  06/27/2021   IR REMOVAL TUN ACCESS W/ PORT W/O FL MOD SED  04/30/2022   Left elbow bursa removed     NASOPHARYNGOSCOPY N/A 06/04/2021   Procedure: NASAL ENDOSCOPY WITH BIOPSY OF NASOPHARYNX; FROZEN SECTION;  Surgeon: Serena Colonel, MD;  Location: [facility] OR;  Service: ENT;  Laterality: N/A;   TONSILLECTOMY  74    Current Outpatient Medications  Medication Sig Dispense Refill   ALPRAZolam (XANAX) 1 MG tablet Take 1 mg by mouth 3 (three) times daily as needed for anxiety.     Blood Glucose Monitoring Suppl (ONETOUCH VERIO FLEX SYSTEM) w/Device KIT daily. as directed     folic acid (FOLVITE) 1 MG tablet Take 1 mg by mouth 2 (two) times daily.     levothyroxine (SYNTHROID) 50 MCG tablet Take 50 mcg by mouth at bedtime.     methotrexate (RHEUMATREX) 2.5 MG tablet TAKE EIGHT TABLETS BY MOUTH ONCE WEEKLY 96 tablet 0   Multiple Vitamin (MULTIVITAMIN) tablet Take 1 tablet by mouth daily.     ONETOUCH VERIO test strip  daily.     pantoprazole (PROTONIX) 40 MG tablet Take 40 mg by mouth at bedtime.     sildenafil (VIAGRA) 25 MG tablet Take 25 mg by mouth daily as needed for erectile dysfunction.     No current facility-administered medications for this visit.   Allergies:  Patient has no known allergies.   Social History: The patient  reports that he quit smoking about 21 months ago. His smoking use included cigarettes. He started smoking about 49 years ago. He has a 47.3  pack-year smoking history. He has been exposed to tobacco smoke. He has never used smokeless tobacco. He reports that he does not drink alcohol and does not use drugs.   Family History: The patient's family history includes COPD in his mother; Diabetes in his brother; Heart attack in his brother, brother, and father; Heart attack (age of onset: 75) in his brother; Heart attack (age of onset: 79) in his sister; Heart disease in his brother, mother, and sister; Heart disease (age of onset: 77) in his brother; Kidney disease in his sister.   ROS:  Please see the history of present illness. Otherwise, complete review of systems is positive for none  All other systems are reviewed and negative.   Physical Exam: VS:  BP 100/68   Pulse 76   Ht 6\' 1"  (1.854 m)   Wt 196 lb 12.8 oz (89.3 kg)   SpO2 99%   BMI 25.96 kg/m , BMI Body mass index is 25.96 kg/m.  Wt Readings from Last 3 Encounters:  04/16/23 196 lb 12.8 oz (89.3 kg)  04/15/23 195 lb 9.6 oz (88.7 kg)  11/19/22 204 lb 3.2 oz (92.6 kg)    General: Patient appears comfortable at rest. HEENT: Conjunctiva and lids normal, oropharynx clear with moist mucosa. Neck: Supple, no elevated JVP or carotid bruits, no thyromegaly. Lungs: Clear to auscultation, nonlabored breathing at rest. Cardiac: Regular rate and rhythm, no S3 or significant systolic murmur, no pericardial rub. Abdomen: Soft, nontender, no hepatomegaly, bowel sounds present, no guarding or rebound. Extremities: No pitting edema, distal pulses 2+. Skin: Warm and dry. Musculoskeletal: No kyphosis. Neuropsychiatric: Alert and oriented x3, affect grossly appropriate.  Recent Labwork: No results found for requested labs within last 365 days.     Component Value Date/Time   CHOL 109 01/22/2016 1250   TRIG 106 01/22/2016 1250   HDL 25 (L) 01/22/2016 1250   CHOLHDL 4.4 01/22/2016 1250   VLDL 21 01/22/2016 1250   LDLCALC 63 01/22/2016 1250     Assessment and Plan:  Left  coronary calcifications LHC from 2017 showed angiographically normal coronaries. NST from 2010, 2016 and 2022 showed no evidence of ischemia. Echo from 02/2021 showed normal LVEF, indeterminate diastology, normal RV EF, mild MR.  Patient has chest pains only when he has panic attack but otherwise denies any angina.  No further cardiac workup is indicated at this time.       Medication Adjustments/Labs and Tests Ordered: Current medicines are reviewed at length with the patient today.  Concerns regarding medicines are outlined above.    Disposition:  Follow up prn  Signed  Verne Spurr, MD, 04/30/2023 9:32 AM    [facility] at [facility] [address] [place], [place], [place] [address]

## 2023-06-09 ENCOUNTER — Telehealth: Payer: Self-pay | Admitting: *Deleted

## 2023-06-09 NOTE — Telephone Encounter (Signed)
CALLED PATIENT TO ASK ABOUT COMING FOR FU APPT. ON 12-02-23 @ 2 PM INSTEAD OF 11-25-23 @ 2 PM DUE TO DR. SQUIRE BEING ON VACATION, SPOKE WITH PATIENT AND HE AGREED TO COME FOR FU APPT. ON 12-02-23 @ 2 PM

## 2023-06-24 DIAGNOSIS — Z08 Encounter for follow-up examination after completed treatment for malignant neoplasm: Secondary | ICD-10-CM | POA: Diagnosis not present

## 2023-06-24 DIAGNOSIS — Z85818 Personal history of malignant neoplasm of other sites of lip, oral cavity, and pharynx: Secondary | ICD-10-CM | POA: Diagnosis not present

## 2023-06-24 DIAGNOSIS — Z923 Personal history of irradiation: Secondary | ICD-10-CM | POA: Diagnosis not present

## 2023-07-09 DIAGNOSIS — C44622 Squamous cell carcinoma of skin of right upper limb, including shoulder: Secondary | ICD-10-CM | POA: Diagnosis not present

## 2023-07-09 DIAGNOSIS — X32XXXA Exposure to sunlight, initial encounter: Secondary | ICD-10-CM | POA: Diagnosis not present

## 2023-07-09 DIAGNOSIS — L570 Actinic keratosis: Secondary | ICD-10-CM | POA: Diagnosis not present

## 2023-07-18 NOTE — Progress Notes (Signed)
Office Visit Note  Patient: Dustin [name]             Date of Birth: [birth year]           MRN: [id number]             PCP: Elfredia Nevins, MD Referring: Elfredia Nevins, MD Visit Date: 07/29/2023 Occupation: @GUAROCC @  Subjective:  Medication management  History of Present Illness: Dustin [name] is a 62 y.o. male with seropositive rheumatoid arthritis and osteoarthritis.  He states he resumed methotrexate in June 2023 after the PET scan.  He has been getting methotrexate through Dr. Sherwood Gambler.  He also gets labs through Dr. Sherwood Gambler.  He is currently on methotrexate 8 tablets by mouth every week along with folic acid 2 mg by mouth daily.  He denies any flares of rheumatoid arthritis.  He denies discomfort in his hands, shoulders or knees.  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  He has been getting treatment through dermatologist.  He denies any increased shortness of breath.  He finished treatment for nasopharyngeal cancer and March 2023 which included chemotherapy and radiation therapy.    Activities of Daily Living:  Patient reports morning stiffness for 0 hours.   Patient Denies nocturnal pain.  Difficulty dressing/grooming: Denies Difficulty climbing stairs: Denies Difficulty getting out of chair: Denies Difficulty using hands for taps, buttons, cutlery, and/or writing: Denies  Review of Systems  Constitutional:  Positive for fatigue.  HENT:  Positive for mouth sores and mouth dryness.   Eyes: Negative.  Negative for dryness.  Respiratory: Negative.  Negative for shortness of breath.   Cardiovascular: Negative.  Negative for chest pain and palpitations.  Gastrointestinal: Negative.  Negative for blood in stool, constipation and diarrhea.  Endocrine: Negative.  Negative for increased urination.  Genitourinary: Negative.  Negative for involuntary urination.  Musculoskeletal:  Positive for joint pain and joint pain. Negative for  gait problem, joint swelling, myalgias, muscle weakness, morning stiffness, muscle tenderness and myalgias.  Skin: Negative.  Negative for color change, rash, hair loss and sensitivity to sunlight.  Allergic/Immunologic: Negative.  Negative for susceptible to infections.  Neurological:  Negative for dizziness and headaches.  Hematological: Negative.  Negative for swollen glands.  Psychiatric/Behavioral: Negative.  Negative for depressed mood and sleep disturbance. The patient is not nervous/anxious.     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/14/2017   Abscess of left olecranon bursa 11/07/2016   High risk medication use 11/07/2016   Cervical disc disease 11/07/2016   Primary osteoarthritis of both hands 11/07/2016   Primary osteoarthritis of both knees 11/07/2016   Primary osteoarthritis of both shoulders 11/07/2016   Precordial pain 01/22/2016   Pain in the chest    Hyperlipidemia 01/20/2016   Depression with anxiety 01/20/2016   GERD (gastroesophageal reflux disease) 01/20/2016   Type 2 diabetes mellitus (HCC) 01/20/2016   Chest pain at rest 09/22/2014  Tobacco use 09/22/2014   Morbid obesity (HCC) 09/22/2014   Rheumatoid arthritis (HCC) 09/22/2014   Family history of heart disease 09/22/2014   Chest pain 09/22/2014    Past Medical History:  Diagnosis Date   Anxiety    COPD (chronic obstructive pulmonary disease) (HCC)    Depression    Essential hypertension    GERD  (gastroesophageal reflux disease)    H/O hiatal hernia    History of cardiac catheterization    Normal coronaries May 2017   History of ITP    Childhood   Hypothyroidism    Osteoarthritis    Type 2 diabetes mellitus (HCC)    no meds    Family History  Problem Relation Age of Onset   Heart disease Mother    COPD Mother    Heart attack Father        Sudden death   Heart attack Sister 34   Kidney disease Sister    Heart disease Sister    Heart attack Brother    Heart disease Brother    Diabetes Brother    Heart attack Brother    Heart disease Brother 56       CABG   Heart attack Brother 80   Past Surgical History:  Procedure Laterality Date   ANTERIOR CERVICAL DECOMP/DISCECTOMY FUSION  01/21/2012   Procedure: ANTERIOR CERVICAL DECOMPRESSION/DISCECTOMY FUSION 2 LEVELS;  Surgeon: Maeola Harman, MD;  Location: [facility];  Service: Neurosurgery;  Laterality: N/A;  Cervical Six-Seven, Cervical Seven-Thoracic One, Anterior Cervical Decompression with Fusion Interbody Prothesis Plating and Bonegraft possible posterior Cervical Seven-Thoracic One Foraminotomy   CARDIAC CATHETERIZATION  [phone]   normal coronary arteries   CARDIAC CATHETERIZATION N/A 01/22/2016   Procedure: Left Heart Cath and Coronary Angiography;  Surgeon: Kathleene Hazel, MD;  Location: [facility] INVASIVE CV LAB;  Service: Cardiovascular;  Laterality: N/A;   CARPAL TUNNEL RELEASE Bilateral    CERVICAL DISC SURGERY  04   CHOLECYSTECTOMY N/A 10/26/2014   Procedure: LAPAROSCOPIC CHOLECYSTECTOMY;  Surgeon: Dalia Heading, MD;  Location: [facility];  Service: General;  Laterality: N/A;   ESOPHAGOGASTRODUODENOSCOPY N/A 05/03/2016   Procedure: ESOPHAGOGASTRODUODENOSCOPY (EGD);  Surgeon: Malissa Hippo, MD;  Location: [facility];  Service: Endoscopy;  Laterality: N/A;  10:30   HERNIA REPAIR Right 60's   IR IMAGING GUIDED PORT INSERTION  06/27/2021   IR REMOVAL TUN ACCESS W/ PORT W/O FL MOD SED  04/30/2022   Left elbow  bursa removed     NASOPHARYNGOSCOPY N/A 06/04/2021   Procedure: NASAL ENDOSCOPY WITH BIOPSY OF NASOPHARYNX; FROZEN SECTION;  Surgeon: Serena Colonel, MD;  Location: [facility] OR;  Service: ENT;  Laterality: N/A;   TONSILLECTOMY  39   Social History   Social History Narrative   Not on file   Immunization History  Administered Date(s) Administered   Influenza-Unspecified 06/16/2022   Moderna Covid-19 Vaccine Bivalent Booster 76yrs & up 06/20/2021   Moderna Sars-Covid-2 Vaccination 12/13/2019, 01/03/2020     Objective: Vital Signs: BP 98/65 (BP Location: Right Arm, Patient Position: Sitting, Cuff Size: Normal)   Pulse 73   Resp 16   Ht 6\' 1"  (1.854 m)   Wt 208 lb (94.3 kg)   BMI 27.44 kg/m    Physical Exam Vitals and nursing note reviewed.  Constitutional:      Appearance: He is well-developed.  HENT:     Head: Normocephalic and atraumatic.  Eyes:     Conjunctiva/sclera: Conjunctivae normal.     Pupils: Pupils are equal,  round, and reactive to light.  Cardiovascular:     Rate and Rhythm: Normal rate and regular rhythm.     Heart sounds: Normal heart sounds.  Pulmonary:     Effort: Pulmonary effort is normal.     Breath sounds: Normal breath sounds.  Abdominal:     General: Bowel sounds are normal.     Palpations: Abdomen is soft.  Musculoskeletal:     Cervical back: Normal range of motion and neck supple.  Skin:    General: Skin is warm and dry.     Capillary Refill: Capillary refill takes less than 2 seconds.  Neurological:     Mental Status: He is alert and oriented to person, place, and time.  Psychiatric:        Behavior: Behavior normal.      Musculoskeletal Exam: He had some limitation with range of motion of the cervical spine on lateral rotation.  Shoulder joints, elbow joints, wrist joints with good range of motion.  He had thickening of MCP joints with no synovitis.  Hip joints and knee joints were in good range of motion without any warmth swelling or effusion.   There was no tenderness over ankles or MTPs.  CDAI Exam: CDAI Score: -- Patient Global: 0 / 100; Provider Global: 0 / 100 Swollen: --; Tender: -- Joint Exam 07/29/2023   No joint exam has been documented for this visit   There is currently no information documented on the homunculus. Go to the Rheumatology activity and complete the homunculus joint exam.  Investigation: No additional findings.  Imaging: No results found.  Recent Labs: Lab Results  Component Value Date   WBC 4.5 12/25/2021   HGB 12.6 (L) 12/25/2021   PLT 253 12/25/2021   NA 141 12/25/2021   K 4.7 12/25/2021   CL 103 12/25/2021   CO2 26 12/25/2021   GLUCOSE 92 12/25/2021   BUN 19 12/25/2021   CREATININE 0.79 12/25/2021   BILITOT 1.0 12/25/2021   ALKPHOS 48 11/08/2021   AST 19 12/25/2021   ALT 12 12/25/2021   PROT 7.4 12/25/2021   ALBUMIN 4.0 11/08/2021   CALCIUM 9.8 12/25/2021   GFRAA 108 09/29/2020   July 2024 CBC and CMP were normal at [facility].  Speciality Comments: No specialty comments available.  Procedures:  No procedures performed Allergies: Patient has no known allergies.   Assessment / Plan:     Visit Diagnoses: Rheumatoid arthritis involving multiple sites with positive rheumatoid factor (HCC) - +RF,+anti-CCP,+ANA, with nodulosis: Patient was off methotrexate from September 2000 22nd until June 2023.  He states he resumed methotrexate in June 2023 after the PET scan.  He has been taking methotrexate 8 tablets p.o. weekly along with folic acid 2 mg p.o. daily.  He denies any interruption in the therapy.  He states he has not had any joint flares.  He wants to reduce the dose of methotrexate as he has not had any flares.  He wants to reduce the dose of methotrexate to 6 tablets p.o. weekly.  I was in agreement.  I advised him to resume methotrexate back to 8 tablets p.o. weekly if he develops a flare or increased joint pain.  He will also discuss this with Dr. Sherwood Gambler.  Rheumatoid nodulosis  (HCC) - Resolved.  High risk medication use - methotrexate 8 tablets by mouth every week and folic acid 2 mg by mouth daily (was on hold from September 2022-June 2023 due to chemotherapy) patient resume methotrexate in June 2023.  He has been getting prescription for methotrexate and labs through Dr. Sherwood Gambler.  July 2024 CBC with differential and CMP with GFR were normal.  Advised him to get labs every 3 months.  Will get labs in November and every 3 months to monitor for drug toxicity.History of positive PPD treated with INH.  Information for immunization was placed in the wrist.  He was advised to hold methotrexate if he develops an infection resume after the infection resolves.  Primary osteoarthritis of both hands-he has bilateral PIP and DIP thickening.  Joint protection muscle strengthening was discussed.  Primary osteoarthritis of both shoulders-he had good range of motion without any discomfort.  Primary osteoarthritis of both knees-no warmth swelling or effusion was noted.  He denies any discomfort in his knee joints.  Cervical disc disease-he has some limitation with range of motion of the cervical spine.  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 Completed RTX 11/20/2021.  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  Other medical problems are listed as follows:  History of hyperlipidemia  History of gastroesophageal reflux (GERD)  History of diabetes mellitus  Former smoker  Primary insomnia  History of depression  History of hypothyroidism  Orders: No orders of the defined types were placed in this encounter.  No orders of the defined types were placed in this encounter.    Follow-Up Instructions: Return in about 1 year (around 07/28/2024) for Rheumatoid arthritis,  Osteoarthritis.  Patient wants to come back only on a yearly basis due to his high co-pay.  He will continue to get methotrexate and labs with Dr. Sherwood Gambler.   Pollyann Savoy, MD  Note - This record has been created using Dragon software.  Chart creation errors have been sought, but may not always  have been located. Such creation errors do not reflect on  the standard of medical care.

## 2023-07-29 ENCOUNTER — Encounter: Payer: Self-pay | Admitting: Rheumatology

## 2023-07-29 ENCOUNTER — Ambulatory Visit: Payer: BC Managed Care – PPO | Attending: Rheumatology | Admitting: Rheumatology

## 2023-07-29 VITALS — BP 98/65 | HR 73 | Resp 16 | Ht 73.0 in | Wt 208.0 lb

## 2023-07-29 DIAGNOSIS — M19012 Primary osteoarthritis, left shoulder: Secondary | ICD-10-CM

## 2023-07-29 DIAGNOSIS — C113 Malignant neoplasm of anterior wall of nasopharynx: Secondary | ICD-10-CM | POA: Diagnosis not present

## 2023-07-29 DIAGNOSIS — Z8659 Personal history of other mental and behavioral disorders: Secondary | ICD-10-CM

## 2023-07-29 DIAGNOSIS — Z9289 Personal history of other medical treatment: Secondary | ICD-10-CM

## 2023-07-29 DIAGNOSIS — M0579 Rheumatoid arthritis with rheumatoid factor of multiple sites without organ or systems involvement: Secondary | ICD-10-CM

## 2023-07-29 DIAGNOSIS — Z1331 Encounter for screening for depression: Secondary | ICD-10-CM | POA: Diagnosis not present

## 2023-07-29 DIAGNOSIS — Z79899 Other long term (current) drug therapy: Secondary | ICD-10-CM

## 2023-07-29 DIAGNOSIS — C44621 Squamous cell carcinoma of skin of unspecified upper limb, including shoulder: Secondary | ICD-10-CM

## 2023-07-29 DIAGNOSIS — Z23 Encounter for immunization: Secondary | ICD-10-CM | POA: Diagnosis not present

## 2023-07-29 DIAGNOSIS — Z87891 Personal history of nicotine dependence: Secondary | ICD-10-CM

## 2023-07-29 DIAGNOSIS — M0630 Rheumatoid nodule, unspecified site: Secondary | ICD-10-CM | POA: Diagnosis not present

## 2023-07-29 DIAGNOSIS — Z0001 Encounter for general adult medical examination with abnormal findings: Secondary | ICD-10-CM | POA: Diagnosis not present

## 2023-07-29 DIAGNOSIS — Z8639 Personal history of other endocrine, nutritional and metabolic disease: Secondary | ICD-10-CM

## 2023-07-29 DIAGNOSIS — Z6827 Body mass index (BMI) 27.0-27.9, adult: Secondary | ICD-10-CM | POA: Diagnosis not present

## 2023-07-29 DIAGNOSIS — M170 Bilateral primary osteoarthritis of knee: Secondary | ICD-10-CM

## 2023-07-29 DIAGNOSIS — M19041 Primary osteoarthritis, right hand: Secondary | ICD-10-CM

## 2023-07-29 DIAGNOSIS — C119 Malignant neoplasm of nasopharynx, unspecified: Secondary | ICD-10-CM

## 2023-07-29 DIAGNOSIS — M5090 Cervical disc disorder, unspecified, unspecified cervical region: Secondary | ICD-10-CM

## 2023-07-29 DIAGNOSIS — F411 Generalized anxiety disorder: Secondary | ICD-10-CM | POA: Diagnosis not present

## 2023-07-29 DIAGNOSIS — F5101 Primary insomnia: Secondary | ICD-10-CM

## 2023-07-29 DIAGNOSIS — J439 Emphysema, unspecified: Secondary | ICD-10-CM | POA: Diagnosis not present

## 2023-07-29 DIAGNOSIS — I10 Essential (primary) hypertension: Secondary | ICD-10-CM | POA: Diagnosis not present

## 2023-07-29 DIAGNOSIS — M069 Rheumatoid arthritis, unspecified: Secondary | ICD-10-CM | POA: Diagnosis not present

## 2023-07-29 DIAGNOSIS — K219 Gastro-esophageal reflux disease without esophagitis: Secondary | ICD-10-CM | POA: Diagnosis not present

## 2023-07-29 DIAGNOSIS — M19011 Primary osteoarthritis, right shoulder: Secondary | ICD-10-CM

## 2023-07-29 DIAGNOSIS — E063 Autoimmune thyroiditis: Secondary | ICD-10-CM | POA: Diagnosis not present

## 2023-07-29 DIAGNOSIS — M19042 Primary osteoarthritis, left hand: Secondary | ICD-10-CM

## 2023-07-29 DIAGNOSIS — Z8719 Personal history of other diseases of the digestive system: Secondary | ICD-10-CM

## 2023-07-29 NOTE — Patient Instructions (Signed)
Standing Labs We placed an order today for your standing lab work.   Please have your standing labs drawn in November and every 3 months  Please have your labs drawn 2 weeks prior to your appointment so that the provider can discuss your lab results at your appointment, if possible.  Please note that you may see your imaging and lab results in MyChart before we have reviewed them. We will contact you once all results are reviewed. Please allow our office up to 72 hours to thoroughly review all of the results before contacting the office for clarification of your results.  WALK-IN LAB HOURS  Monday through Thursday from 8:00 am -12:30 pm and 1:00 pm-5:00 pm and Friday from 8:00 am-12:00 pm.  Patients with office visits requiring labs will be seen before walk-in labs.  You may encounter longer than normal wait times. Please allow additional time. Wait times may be shorter on  Monday and Thursday afternoons.  We do not book appointments for walk-in labs. We appreciate your patience and understanding with our staff.   Labs are drawn by [facility]. Please bring your co-pay at the time of your lab draw.  You may receive a bill from [facility] for your lab work.  Please note if you are on Hydroxychloroquine and and an order has been placed for a Hydroxychloroquine level,  you will need to have it drawn 4 hours or more after your last dose.  If you wish to have your labs drawn at another location, please call the office 24 hours in advance so we can fax the orders.  The office is located at [address], [place], [place] [address]   If you have any questions regarding directions or hours of operation,  please call [phone].   As a reminder, please drink plenty of water prior to coming for your lab work. Thanks!   Vaccines You are taking a medication(s) that can suppress your immune system.  The following immunizations are recommended: Flu annually Covid-19 RSV  Td/Tdap (tetanus,  diphtheria, pertussis) every 10 years Pneumonia (Prevnar 15 then Pneumovax 23 at least 1 year apart.  Alternatively, can take Prevnar 20 without needing additional dose) Shingrix: 2 doses from 4 weeks to 6 months apart  Please check with your PCP to make sure you are up to date.  If you have signs or symptoms of an infection or start antibiotics: First, call your PCP for workup of your infection. Hold your medication through the infection, until you complete your antibiotics, and until symptoms resolve if you take the following: Injectable medication (Actemra, Benlysta, Cimzia, Cosentyx, Enbrel, Humira, Kevzara, Orencia, Remicade, Simponi, Stelara, Taltz, Tremfya) Methotrexate Leflunomide (Arava) Mycophenolate (Cellcept) Harriette Ohara, Olumiant, or Rinvoq

## 2023-08-06 NOTE — Telephone Encounter (Signed)
Telephone call  

## 2023-08-07 NOTE — Telephone Encounter (Signed)
Telephone call  

## 2023-08-20 DIAGNOSIS — Z85828 Personal history of other malignant neoplasm of skin: Secondary | ICD-10-CM | POA: Diagnosis not present

## 2023-08-20 DIAGNOSIS — Z08 Encounter for follow-up examination after completed treatment for malignant neoplasm: Secondary | ICD-10-CM | POA: Diagnosis not present

## 2023-08-20 DIAGNOSIS — D225 Melanocytic nevi of trunk: Secondary | ICD-10-CM | POA: Diagnosis not present

## 2023-08-20 DIAGNOSIS — Z1283 Encounter for screening for malignant neoplasm of skin: Secondary | ICD-10-CM | POA: Diagnosis not present

## 2023-08-20 DIAGNOSIS — L570 Actinic keratosis: Secondary | ICD-10-CM | POA: Diagnosis not present

## 2023-08-20 DIAGNOSIS — X32XXXD Exposure to sunlight, subsequent encounter: Secondary | ICD-10-CM | POA: Diagnosis not present

## 2023-10-15 DIAGNOSIS — Z08 Encounter for follow-up examination after completed treatment for malignant neoplasm: Secondary | ICD-10-CM | POA: Diagnosis not present

## 2023-10-15 DIAGNOSIS — D225 Melanocytic nevi of trunk: Secondary | ICD-10-CM | POA: Diagnosis not present

## 2023-10-15 DIAGNOSIS — Z85828 Personal history of other malignant neoplasm of skin: Secondary | ICD-10-CM | POA: Diagnosis not present

## 2023-10-15 DIAGNOSIS — D485 Neoplasm of uncertain behavior of skin: Secondary | ICD-10-CM | POA: Diagnosis not present

## 2023-10-21 ENCOUNTER — Inpatient Hospital Stay: Payer: BC Managed Care – PPO | Admitting: Oncology

## 2023-10-21 ENCOUNTER — Encounter: Payer: Self-pay | Admitting: *Deleted

## 2023-10-22 DIAGNOSIS — M069 Rheumatoid arthritis, unspecified: Secondary | ICD-10-CM | POA: Diagnosis not present

## 2023-10-22 DIAGNOSIS — F419 Anxiety disorder, unspecified: Secondary | ICD-10-CM | POA: Diagnosis not present

## 2023-10-22 DIAGNOSIS — I10 Essential (primary) hypertension: Secondary | ICD-10-CM | POA: Diagnosis not present

## 2023-10-22 DIAGNOSIS — Z6828 Body mass index (BMI) 28.0-28.9, adult: Secondary | ICD-10-CM | POA: Diagnosis not present

## 2023-10-22 DIAGNOSIS — M1991 Primary osteoarthritis, unspecified site: Secondary | ICD-10-CM | POA: Diagnosis not present

## 2023-10-22 DIAGNOSIS — C113 Malignant neoplasm of anterior wall of nasopharynx: Secondary | ICD-10-CM | POA: Diagnosis not present

## 2023-10-22 DIAGNOSIS — E663 Overweight: Secondary | ICD-10-CM | POA: Diagnosis not present

## 2023-10-29 DIAGNOSIS — Z20822 Contact with and (suspected) exposure to covid-19: Secondary | ICD-10-CM | POA: Diagnosis not present

## 2023-10-29 DIAGNOSIS — B349 Viral infection, unspecified: Secondary | ICD-10-CM | POA: Diagnosis not present

## 2023-10-29 DIAGNOSIS — R509 Fever, unspecified: Secondary | ICD-10-CM | POA: Diagnosis not present

## 2023-11-03 ENCOUNTER — Telehealth: Payer: Self-pay | Admitting: *Deleted

## 2023-11-03 NOTE — Telephone Encounter (Signed)
CALLED PATIENT TO INFORM OF CT FOR 11-20-23- ARRIVAL TIME- 2 PM @ [facility],NO RESTRICTIONS TO SCAN, PATIENT TO RECEIVE RESULTS FROM DR. SQUIRE ON 12-02-23 @ 2 PM, LVM FOR A RETURN CALL

## 2023-11-12 ENCOUNTER — Encounter: Payer: Self-pay | Admitting: Nurse Practitioner

## 2023-11-12 ENCOUNTER — Inpatient Hospital Stay: Payer: BC Managed Care – PPO | Attending: Nurse Practitioner | Admitting: Nurse Practitioner

## 2023-11-12 VITALS — BP 99/65 | HR 81 | Temp 98.1°F | Resp 18 | Ht 73.0 in | Wt 216.4 lb

## 2023-11-12 DIAGNOSIS — M069 Rheumatoid arthritis, unspecified: Secondary | ICD-10-CM | POA: Insufficient documentation

## 2023-11-12 DIAGNOSIS — E039 Hypothyroidism, unspecified: Secondary | ICD-10-CM | POA: Insufficient documentation

## 2023-11-12 DIAGNOSIS — E119 Type 2 diabetes mellitus without complications: Secondary | ICD-10-CM | POA: Insufficient documentation

## 2023-11-12 DIAGNOSIS — R590 Localized enlarged lymph nodes: Secondary | ICD-10-CM | POA: Insufficient documentation

## 2023-11-12 DIAGNOSIS — C119 Malignant neoplasm of nasopharynx, unspecified: Secondary | ICD-10-CM

## 2023-11-12 DIAGNOSIS — Z79899 Other long term (current) drug therapy: Secondary | ICD-10-CM | POA: Diagnosis not present

## 2023-11-12 NOTE — Progress Notes (Signed)
  Dustin [name] OFFICE PROGRESS NOTE   Diagnosis: Nasopharynx cancer  INTERVAL HISTORY:   Dustin [name] returns for follow-up.  He saw Dustin [name] 06/24/2023.  There was no evidence of recurrent disease.  He feels well.  He notes a dry mouth with certain foods.  No nausea or vomiting.  No constipation or diarrhea.    Objective:  Vital signs in last 24 hours:  Blood pressure 99/65, pulse 81, temperature 98.1 F (36.7 C), temperature source Temporal, resp. rate 18, height 6\' 1"  (1.854 m), weight 216 lb 6.4 oz (98.2 kg), SpO2 95%.    HEENT: Oropharynx without visible mass.  Neck without mass. Lymphatics: No palpable cervical, supraclavicular, axillary or inguinal lymph nodes. Resp: Lungs clear bilaterally. Cardio: Regular rate and rhythm. GI: No hepatosplenomegaly. Vascular: Trace lower leg edema bilaterally.    Lab Results:  Lab Results  Component Value Date   WBC 4.5 12/25/2021   HGB 12.6 (L) 12/25/2021   HCT 38.5 12/25/2021   MCV 98.5 12/25/2021   PLT 253 12/25/2021   NEUTROABS 3,164 12/25/2021    Imaging:  No results found.  Medications: I have reviewed the patient's current medications.  Assessment/Plan: Nasopharyngeal cancer, presenting with left neck adenopathy MRI cervical spine 04/27/2021-bilateral enlarged upper jugular chain lymph nodes.  Right level 2 node measures 18 mm CT neck 05/25/2021-bilateral nasopharyngeal mass eroding the skull base at the level of the clivus and inferior sphenoid sinus, enlarged right jugular digastric node into enlarged left level 2 nodes, mild soft tissue density in the inferior left sphenoid sinus-tumor? 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 MRI face 06/19/2021-extensive tumor invasion of the skull base, no trigeminal or other cranial nerve involvement, abnormal bilateral level 2 nodes PET 06/19/2021-decrease size of nasopharyngeal mass compared to 05/25/2021 CT, invasion of the clivus, single  bilateral level 2A lymph nodes mildly enlarged and hypermetabolic, no evidence of distant metastatic disease Cycle 1 gemcitabine/carboplatin 07/06/2021, day 8 gemcitabine 07/05/2021 Cycle 2 gemcitabine/carboplatin 07/27/2021 Cycle 3 gemcitabine/carboplatin 08/17/2021 PET scan 09/14/2021-decrease in size and degree of FDG uptake associated with FDG avid posterior nasopharyngeal lesion.  Residual FDG uptake along the midline of the posterior nasopharynx.  Persistent but improved asymmetric increased uptake within the right tonsillar region.  Resolution of previous FDG avid cervical lymph nodes. Radiation to primary site and bilateral neck beginning 10/03/2021, completed 11/20/2021 Concurrent weekly carboplatin beginning 10/04/2021, 6 weekly cycles completed 11/08/2021 ENT exam 01/24/2022-no evidence of disease PET 02/22/2022-no residual asymmetric or focal nasopharyngeal hypermetabolism, no residual tonsillar hypermetabolism no evidence for hypermetabolic lymphadenopathy in the neck, no new site of suspicious hypermetabolism CTs 11/13/2022 neck and chest-marked decrease in the size of the previously seen nasopharyngeal mass.  Mild residual mucosal thickening likely secondary to treatment changes.  Complete resolution of previously seen bilateral level 2 lymphadenopathy.  No newly enlarged cervical lymph nodes.  No evidence of metastatic disease in the chest.  Emphysema and mild diffuse bilateral bronchial wall thickening.  CAD.  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 Torok remains in clinical remission from nasopharyngeal carcinoma.  He is scheduled for CT scans and to see Dustin [name] next month.  I encouraged him to continue follow-up with Dustin [name].  He will return for an appointment here in 6 months.    Dustin [name] ANP/GNP-BC   11/12/2023  9:39 AM

## 2023-11-19 ENCOUNTER — Other Ambulatory Visit (HOSPITAL_COMMUNITY): Payer: BC Managed Care – PPO

## 2023-11-19 ENCOUNTER — Ambulatory Visit (HOSPITAL_COMMUNITY)
Admission: RE | Admit: 2023-11-19 | Discharge: 2023-11-19 | Disposition: A | Discharge status: Home / Self Care | Payer: BC Managed Care – PPO | Source: Ambulatory Visit | Attending: Radiology | Admitting: Radiology

## 2023-11-19 DIAGNOSIS — C119 Malignant neoplasm of nasopharynx, unspecified: Secondary | ICD-10-CM | POA: Insufficient documentation

## 2023-11-19 DIAGNOSIS — I700 Atherosclerosis of aorta: Secondary | ICD-10-CM | POA: Diagnosis not present

## 2023-11-19 DIAGNOSIS — J323 Chronic sphenoidal sinusitis: Secondary | ICD-10-CM | POA: Diagnosis not present

## 2023-11-19 DIAGNOSIS — R918 Other nonspecific abnormal finding of lung field: Secondary | ICD-10-CM | POA: Diagnosis not present

## 2023-11-19 DIAGNOSIS — C760 Malignant neoplasm of head, face and neck: Secondary | ICD-10-CM | POA: Diagnosis not present

## 2023-11-19 DIAGNOSIS — I6529 Occlusion and stenosis of unspecified carotid artery: Secondary | ICD-10-CM | POA: Diagnosis not present

## 2023-11-19 MED ORDER — IOHEXOL 300 MG/ML  SOLN
75.0000 mL | Freq: Once | INTRAMUSCULAR | Status: AC | PRN
  Administered 2023-11-19: 75 mL via INTRAVENOUS

## 2023-11-20 ENCOUNTER — Ambulatory Visit (HOSPITAL_COMMUNITY): Payer: BC Managed Care – PPO

## 2023-11-25 ENCOUNTER — Ambulatory Visit: Payer: Self-pay | Admitting: Radiation Oncology

## 2023-12-02 ENCOUNTER — Ambulatory Visit: Payer: BC Managed Care – PPO | Admitting: Radiation Oncology

## 2023-12-02 NOTE — Progress Notes (Incomplete)
 [name] presents to the clinic to receive CT scan results. He completed radiation treatment for nasopharynx carcinoma on 11/20/2021.  CT Soft Tissue Neck W Contrast 11/19/2023  CT Chest W Contrast 11/19/2023   Pain issues, if any: *** Using a feeding tube?: *** Weight changes, if any: *** Swallowing issues, if any: *** Smoking or chewing tobacco? *** Using fluoride toothpaste daily? *** Last ENT visit was on: *** Other notable issues, if any: ***

## 2023-12-03 ENCOUNTER — Ambulatory Visit
Admission: RE | Admit: 2023-12-03 | Discharge: 2023-12-03 | Disposition: A | Discharge status: Home / Self Care | Payer: BC Managed Care – PPO | Source: Ambulatory Visit | Attending: Radiation Oncology | Admitting: Radiation Oncology

## 2023-12-03 VITALS — BP 119/75 | HR 49 | Temp 97.5°F | Resp 18 | Ht 73.0 in | Wt 220.6 lb

## 2023-12-03 DIAGNOSIS — C119 Malignant neoplasm of nasopharynx, unspecified: Secondary | ICD-10-CM

## 2023-12-03 DIAGNOSIS — C111 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: Secondary | ICD-10-CM | POA: Diagnosis not present

## 2023-12-05 NOTE — Progress Notes (Signed)
 Radiation Oncology         [phone] ________________________________  Name: Dustin [name] MRN: [id number]  Date: 12/03/2023  DOB: 1960/11/03  Follow-Up Visit Note  CC: Elfredia Nevins, MD  Ladene Artist, MD  Diagnosis and Prior Radiotherapy:    C11.1      ICD-10-CM    1. 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HCC)  C11.9          Cancer Staging  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HCC) Staging form: Pharynx - Nasopharynx, AJCC 8th Edition - Clinical: Stage III (cT3, cN2, cM0) - Signed by Ladene Artist, MD on 06/12/2021   Radiation Treatment Dates: 10/03/2021 through 11/20/2021 Site Technique Total Dose (Gy) Dose per Fx (Gy) Completed Fx Beam Energies  Nasopharynx: HN_NasoP IMRT 70/70 2 35/35 6X   CHIEF COMPLAINT:  Here for surveillance of nasopharyngeal cancer and to review recent CT imaging.     Dustin [name] presents to the clinic to receive CT scan results. He completed radiation treatment for nasopharynx carcinoma on 11/20/2021.  CT Soft Tissue Neck W Contrast 11/19/2023  CT Chest W Contrast 11/19/2023   Pain issues, if any:  Patient denies any throat pain. Using a feeding tube?:  Patient denies Weight changes, if any:  Wt Readings from Last 3 Encounters:  12/03/23 220 lb 9.6 oz (100.1 kg)  11/12/23 216 lb 6.4 oz (98.2 kg)  07/29/23 208 lb (94.3 kg)    Patient gained 12 pounds over the course of a year. He does eat three meals a day  Swallowing issues, if any:  Patient does have issues eating dried foods like bread Taste buds have came back to normal, but two foods which are chocolate and cheese taste don't taste the same anymore.  Smoking or chewing tobacco? Patient smokes a pack of cigarettes a day Patient wants to quit smoking, encouraged smoking cessation. Using fluoride toothpaste daily?  Patient is unsure,  While he was at work, he bit into a pickle and two of his bottom teeth feel off, he  wants to get partials. Patient wanted to know if he could be referred to an oral surgeon. Last ENT visit was on: None Other notable issues, if any:  None Patient has been a truck driver for 26 years   BP 409/81 (BP Location: Left Arm, Patient Position: Sitting, Cuff Size: Normal)   Pulse (!) 49   Temp (!) 97.5 F (36.4 C)   Resp 18   Ht 6\' 1"  (1.854 m)   Wt 220 lb 9.6 oz (100.1 kg)   SpO2 95%   BMI 29.10 kg/m      Wt Readings from Last 3 Encounters:  12/03/23 220 lb 9.6 oz (100.1 kg)  11/12/23 216 lb 6.4 oz (98.2 kg)  07/29/23 208 lb (94.3 kg)      ALLERGIES:  has no known allergies.  Meds: Current Outpatient Medications  Medication Sig Dispense Refill   ALPRAZolam (XANAX) 1 MG tablet Take 1 mg by mouth 3 (three) times daily as needed for anxiety.     Blood Glucose Monitoring Suppl (ONETOUCH VERIO FLEX SYSTEM) w/Device KIT daily. as directed     folic acid (FOLVITE) 1 MG tablet Take 1 mg by mouth 2 (two) times daily.     levothyroxine (SYNTHROID) 50 MCG tablet Take 50 mcg by mouth at bedtime.     methotrexate (RHEUMATREX) 2.5 MG  tablet TAKE EIGHT TABLETS BY MOUTH ONCE WEEKLY 96 tablet 0   Multiple Vitamin (MULTIVITAMIN) tablet Take 1 tablet by mouth daily.     ONETOUCH VERIO test strip daily.     pantoprazole (PROTONIX) 40 MG tablet Take 40 mg by mouth at bedtime.     sildenafil (VIAGRA) 25 MG tablet Take 25 mg by mouth daily as needed for erectile dysfunction. (Patient not taking: Reported on 07/29/2023)     No current facility-administered medications for this encounter.    Physical Findings: The patient is in no acute distress. Patient is alert and oriented. Wt Readings from Last 3 Encounters:  12/03/23 220 lb 9.6 oz (100.1 kg)  11/12/23 216 lb 6.4 oz (98.2 kg)  07/29/23 208 lb (94.3 kg)    height is 6\' 1"  (1.854 m) and weight is 220 lb 9.6 oz (100.1 kg). His temperature is 97.5 F (36.4 C) (abnormal). His blood pressure is 119/75 and his pulse is 49  (abnormal). His respiration is 18 and oxygen saturation is 95%. .  General: Alert and oriented, in no acute distress  HEENT: Head is normocephalic. Extraocular movements are intact. Oropharynx is notable for no lesions.  Anterior teeth are broken Neck: Neck is notable for no palpable masses.  Skin is smooth.  Skin: Skin in treatment fields shows satisfactory healing throughout the neck Lymphatics: see Neck Exam Psychiatric: Judgment and insight are intact. Affect is appropriate. MSK: Well-nourished, ambulatory  Lab Findings: Lab Results  Component Value Date   WBC 4.5 12/25/2021   HGB 12.6 (L) 12/25/2021   HCT 38.5 12/25/2021   MCV 98.5 12/25/2021   PLT 253 12/25/2021    Lab Results  Component Value Date   TSH 2.866 09/24/2021    Radiographic Findings: CT Soft Tissue Neck W Contrast Result Date: 11/28/2023 CLINICAL DATA:  Head/neck cancer, monitor. Nasopharyngeal carcinoma status post completion of chemoradiation therapy. EXAM: CT NECK WITH CONTRAST TECHNIQUE: Multidetector CT imaging of the neck was performed using the standard protocol following the bolus administration of intravenous contrast. RADIATION DOSE REDUCTION: This exam was performed according to the departmental dose-optimization program which includes automated exposure control, adjustment of the mA and/or kV according to patient size and/or use of iterative reconstruction technique. CONTRAST:  75mL OMNIPAQUE IOHEXOL 300 MG/ML  SOLN COMPARISON:  Neck CT 11/12/2022. FINDINGS: Pharynx and larynx: No mass or edema to suggest residual or recurrent nasopharyngeal carcinoma. Salivary glands: Normal parotid glands. Atrophic submandibular glands. Thyroid: Normal. Lymph nodes: No suspicious cervical lymphadenopathy. Vascular: Atherosclerotic calcifications of the carotid bulbs. Limited intracranial: Unremarkable. Visualized orbits: Unremarkable. 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 Mastoids are well aerated Skeleton: Skull base is unremarkable. No evidence of bony erosions. Prior C3-C5 and C6-T1 ACDF. Upper chest: Unchanged emphysema and pleural-parenchymal scarring in the lung apices. Other: None. IMPRESSION: 1. No evidence of residual or recurrent nasopharyngeal carcinoma. 2. No suspicious cervical lymphadenopathy. 3. Stable findings of chronic left sphenoid sinusitis. Electronically Signed   By: Orvan Falconer M.D.   On: 11/28/2023 14:33   CT Chest W Contrast Result Date: 11/28/2023 CLINICAL DATA:  Head/neck cancer. Chemotherapy and radiation therapy complete. * Tracking Code: BO * EXAM: CT CHEST WITH CONTRAST TECHNIQUE: Multidetector CT imaging of the chest was performed during intravenous contrast administration. RADIATION DOSE REDUCTION: This exam was performed according to the departmental dose-optimization program which includes automated exposure control, adjustment of the mA and/or kV according to patient size and/or use of iterative reconstruction technique. CONTRAST:  75mL OMNIPAQUE IOHEXOL 300 MG/ML  SOLN COMPARISON:  11/12/2022 and PET 02/22/2022. FINDINGS: Cardiovascular: Atherosclerotic calcification of the aorta and coronary arteries. Heart is at the upper limits of normal in size. No pericardial effusion. Mediastinum/Nodes: No pathologically enlarged mediastinal, hilar or axillary lymph nodes. Esophagus is grossly unremarkable. Lungs/Pleura: Centrilobular and paraseptal emphysema. Biapical pleuroparenchymal scarring. Peribronchovascular nodularity in the medial left lower lobe (8/118), new. 4 mm posterior segment right upper lobe nodule (8/54), new. No pleural fluid. Debris in the airway. Upper Abdomen: Cholecystectomy. Visualized portions of the liver, adrenal glands, kidneys, spleen, pancreas, stomach and bowel are otherwise grossly unremarkable. No upper abdominal adenopathy. Musculoskeletal: Degenerative changes in the spine. Of No worrisome lytic or  sclerotic lesions. IMPRESSION: 1. New 4 mm right upper lobe nodule, nonspecific. Recommend attention on follow-up. 2. Otherwise, no evidence of metastatic disease. 3. New peribronchovascular nodularity in the left lower lobe, indicative of an infectious bronchiolitis. 4. Aortic atherosclerosis (ICD10-I70.0). Coronary artery calcification. 5.  Emphysema (ICD10-J43.9). Electronically Signed   By: Leanna Battles M.D.   On: 11/28/2023 10:23    Impression/Plan:    1) Head and Neck Cancer Status:  I personally reviewed his recent imaging and it shows no evidence of disease recurrence.  He is in remission.  Given that he is doing well 2 years after treatment he is likely cured.  However I strongly advised him to stop smoking as this increases his risk of cancers later in life.  He will continue to follow-up with medical oncology.  I will send a note to Dr. Truett Perna recommending that he get surveillance imaging of his chest in~ 6 to 12 months -the patient understands this is my recommendation as well.  I recommended the patient see Dr. Pollyann Kennedy and otolaryngology for continued follow-ups and the patient will call his office to schedule accordingly  2) Nutritional Status: Slight weight loss noted but overall he is eating well with no issues  3) Risk Factors: The patient has been educated about risk factors including alcohol and tobacco abuse; they understand that avoidance of alcohol and tobacco is important to prevent recurrences as well as other cancers. He continues to smoke AGAINST MEDICAL ADVICE  4) Swallowing: Good function, continue speech-language pathology exercises  5) Dental:   All of his tooth roots received less than 50 Gy.  He has had some brittle teeth recently.  The patient requests a referral to an oral surgeon as he is interested in some extractions and possible implants.  I have asked our navigator to arrange this and send his dosimetric records to the oral surgeon.  6) Thyroid function:  Patient is on levothyroxine and his PCP is checking his TSH annually. Lab Results  Component Value Date   TSH 2.866 09/24/2021    7) .  Follow-up as above.  I will see him back as needed.  Note sent to medical oncology  On date of service, in total, I spent 30 minutes on this encounter. Patient was seen in person.  Note signed after encounter date; minutes pertain to date of service, only. -----------------------------------  Lonie Peak, MD  ------------------   Lonie Peak, MD

## 2023-12-17 ENCOUNTER — Other Ambulatory Visit: Payer: Self-pay | Admitting: *Deleted

## 2023-12-17 DIAGNOSIS — C119 Malignant neoplasm of nasopharynx, unspecified: Secondary | ICD-10-CM

## 2023-12-17 NOTE — Progress Notes (Signed)
 Per Dr. Truett Perna: Needs CT chest without contrast to f/u right lung nodule. OK to do on day of OV 8/27 or week prior. Order placed. Message sent to managed care to confirm OK to schedule in [facility] system.

## 2023-12-23 DIAGNOSIS — I700 Atherosclerosis of aorta: Secondary | ICD-10-CM | POA: Diagnosis not present

## 2023-12-23 DIAGNOSIS — I10 Essential (primary) hypertension: Secondary | ICD-10-CM | POA: Diagnosis not present

## 2023-12-23 DIAGNOSIS — Z6829 Body mass index (BMI) 29.0-29.9, adult: Secondary | ICD-10-CM | POA: Diagnosis not present

## 2023-12-23 DIAGNOSIS — M069 Rheumatoid arthritis, unspecified: Secondary | ICD-10-CM | POA: Diagnosis not present

## 2023-12-23 DIAGNOSIS — F411 Generalized anxiety disorder: Secondary | ICD-10-CM | POA: Diagnosis not present

## 2023-12-23 DIAGNOSIS — E063 Autoimmune thyroiditis: Secondary | ICD-10-CM | POA: Diagnosis not present

## 2023-12-23 DIAGNOSIS — E663 Overweight: Secondary | ICD-10-CM | POA: Diagnosis not present

## 2024-02-18 DIAGNOSIS — K219 Gastro-esophageal reflux disease without esophagitis: Secondary | ICD-10-CM | POA: Diagnosis not present

## 2024-02-18 DIAGNOSIS — Z716 Tobacco abuse counseling: Secondary | ICD-10-CM | POA: Diagnosis not present

## 2024-02-18 DIAGNOSIS — M069 Rheumatoid arthritis, unspecified: Secondary | ICD-10-CM | POA: Diagnosis not present

## 2024-02-18 DIAGNOSIS — G894 Chronic pain syndrome: Secondary | ICD-10-CM | POA: Diagnosis not present

## 2024-02-18 DIAGNOSIS — Z6828 Body mass index (BMI) 28.0-28.9, adult: Secondary | ICD-10-CM | POA: Diagnosis not present

## 2024-02-18 DIAGNOSIS — R509 Fever, unspecified: Secondary | ICD-10-CM | POA: Diagnosis not present

## 2024-02-18 DIAGNOSIS — F419 Anxiety disorder, unspecified: Secondary | ICD-10-CM | POA: Diagnosis not present

## 2024-02-18 DIAGNOSIS — I10 Essential (primary) hypertension: Secondary | ICD-10-CM | POA: Diagnosis not present

## 2024-02-18 DIAGNOSIS — F411 Generalized anxiety disorder: Secondary | ICD-10-CM | POA: Diagnosis not present

## 2024-02-18 DIAGNOSIS — E663 Overweight: Secondary | ICD-10-CM | POA: Diagnosis not present

## 2024-02-18 NOTE — Progress Notes (Deleted)
 Office Visit Note  Patient: Dustin [name]             Date of Birth: 1961/08/01           MRN: [id number]             PCP: Kathyleen Parkins, MD Referring: Kathyleen Parkins, MD Visit Date: 02/24/2024 Occupation: @GUAROCC @  Subjective:    History of Present Illness: Dustin [name] is a 63 y.o. male with history of seropositive rheumatoid arthritis and osteoarthritis.  Patient remains on methotrexate  8 tablets by mouth once weekly and folic acid  2 mg daily.   CBC and CMP  updated on 12/26/23.  Discussed the importance of holding methotrexate  if he develops signs or symptoms of an infection and to resume once the infection has completely cleared.     Activities of Daily Living:  Patient reports morning stiffness for *** {minute/hour:19697}.   Patient {ACTIONS;DENIES/REPORTS:21021675::"Denies"} nocturnal pain.  Difficulty dressing/grooming: {ACTIONS;DENIES/REPORTS:21021675::"Denies"} Difficulty climbing stairs: {ACTIONS;DENIES/REPORTS:21021675::"Denies"} Difficulty getting out of chair: {ACTIONS;DENIES/REPORTS:21021675::"Denies"} Difficulty using hands for taps, buttons, cutlery, and/or writing: {ACTIONS;DENIES/REPORTS:21021675::"Denies"}  No Rheumatology ROS completed.   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/14/2017   Abscess of left olecranon bursa 11/07/2016   High risk medication use 11/07/2016   Cervical disc disease 11/07/2016   Primary osteoarthritis of both hands  11/07/2016   Primary osteoarthritis of both knees 11/07/2016   Primary osteoarthritis of both shoulders 11/07/2016   Precordial pain 01/22/2016   Pain in the chest    Hyperlipidemia 01/20/2016   Depression with anxiety 01/20/2016   GERD (gastroesophageal reflux disease) 01/20/2016   Type 2 diabetes mellitus (HCC) 01/20/2016   Chest pain at rest 09/22/2014   Tobacco use 09/22/2014   Morbid obesity (HCC) 09/22/2014   Rheumatoid arthritis (HCC) 09/22/2014   Family history of heart disease 09/22/2014   Chest pain 09/22/2014    Past Medical History:  Diagnosis Date   Anxiety    COPD (chronic obstructive pulmonary disease) (HCC)    Depression    Essential hypertension    GERD (gastroesophageal reflux disease)    H/O hiatal hernia    History of cardiac catheterization    Normal coronaries May 2017   History of ITP    Childhood   Hypothyroidism    Osteoarthritis    Type 2 diabetes mellitus (HCC)    no meds    Family History  Problem Relation Age of Onset   Heart disease Mother    COPD Mother    Heart attack Father        Sudden death   Heart attack Sister 36   Kidney disease Sister    Heart disease Sister    Heart attack Brother    Heart disease Brother    Diabetes Brother    Heart attack Brother    Heart disease Brother 11       CABG   Heart attack Brother 75   Past Surgical History:  Procedure Laterality Date  ANTERIOR CERVICAL DECOMP/DISCECTOMY FUSION  01/21/2012   Procedure: ANTERIOR CERVICAL DECOMPRESSION/DISCECTOMY FUSION 2 LEVELS;  Surgeon: Manya Sells, MD;  Location: [facility];  Service: Neurosurgery;  Laterality: N/A;  Cervical Six-Seven, Cervical Seven-Thoracic One, Anterior Cervical Decompression with Fusion Interbody Prothesis Plating and Bonegraft possible posterior Cervical Seven-Thoracic One Foraminotomy   CARDIAC CATHETERIZATION  [phone]   normal coronary arteries   CARDIAC CATHETERIZATION N/A 01/22/2016   Procedure: Left Heart Cath and  Coronary Angiography;  Surgeon: Odie Benne, MD;  Location: [facility];  Service: Cardiovascular;  Laterality: N/A;   CARPAL TUNNEL RELEASE Bilateral    CERVICAL DISC SURGERY  04   CHOLECYSTECTOMY N/A 10/26/2014   Procedure: LAPAROSCOPIC CHOLECYSTECTOMY;  Surgeon: Beau Bound, MD;  Location: [facility] ORS;  Service: General;  Laterality: N/A;   ESOPHAGOGASTRODUODENOSCOPY N/A 05/03/2016   Procedure: ESOPHAGOGASTRODUODENOSCOPY (EGD);  Surgeon: Ruby Corporal, MD;  Location: [facility];  Service: Endoscopy;  Laterality: N/A;  10:30   HERNIA REPAIR Right 60's   IR IMAGING GUIDED PORT INSERTION  06/27/2021   IR REMOVAL TUN ACCESS W/ PORT W/O FL MOD SED  04/30/2022   Left elbow bursa removed     NASOPHARYNGOSCOPY N/A 06/04/2021   Procedure: NASAL ENDOSCOPY WITH BIOPSY OF NASOPHARYNX; FROZEN SECTION;  Surgeon: Janita Mellow, MD;  Location: [facility] OR;  Service: ENT;  Laterality: N/A;   TONSILLECTOMY  46   Social History   Social History Narrative   Not on file   Immunization History  Administered Date(s) Administered   Influenza-Unspecified 06/16/2022   Moderna Covid-19 Vaccine Bivalent Booster 30yrs & up 06/20/2021   Moderna Sars-Covid-2 Vaccination 12/13/2019, 01/03/2020     Objective: Vital Signs: There were no vitals taken for this visit.   Physical Exam Vitals and nursing note reviewed.  Constitutional:      Appearance: He is well-developed.  HENT:     Head: Normocephalic and atraumatic.  Eyes:     Conjunctiva/sclera: Conjunctivae normal.     Pupils: Pupils are equal, round, and reactive to light.  Cardiovascular:     Rate and Rhythm: Normal rate and regular rhythm.     Heart sounds: Normal heart sounds.  Pulmonary:     Effort: Pulmonary effort is normal.     Breath sounds: Normal breath sounds.  Abdominal:     General: Bowel sounds are normal.     Palpations: Abdomen is soft.  Musculoskeletal:     Cervical back: Normal range of motion and neck supple.   Skin:    General: Skin is warm and dry.     Capillary Refill: Capillary refill takes less than 2 seconds.  Neurological:     Mental Status: He is alert and oriented to person, place, and time.  Psychiatric:        Behavior: Behavior normal.      Musculoskeletal Exam: ***  CDAI Exam: CDAI Score: -- Patient Global: --; Provider Global: -- Swollen: --; Tender: -- Joint Exam 02/24/2024   No joint exam has been documented for this visit   There is currently no information documented on the homunculus. Go to the Rheumatology activity and complete the homunculus joint exam.  Investigation: No additional findings.  Imaging: No results found.  Recent Labs: Lab Results  Component Value Date   WBC 4.5 12/25/2021   HGB 12.6 (L) 12/25/2021   PLT 253 12/25/2021   NA 141 12/25/2021   K 4.7 12/25/2021   CL 103 12/25/2021   CO2 26 12/25/2021  GLUCOSE 92 12/25/2021   BUN 19 12/25/2021   CREATININE 0.79 12/25/2021   BILITOT 1.0 12/25/2021   ALKPHOS 48 11/08/2021   AST 19 12/25/2021   ALT 12 12/25/2021   PROT 7.4 12/25/2021   ALBUMIN  4.0 11/08/2021   CALCIUM  9.8 12/25/2021   GFRAA 108 09/29/2020    Speciality Comments: No specialty comments available.  Procedures:  No procedures performed Allergies: Patient has no known allergies.   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  No orders of the defined types were placed in this encounter.   Face-to-face time spent with patient was *** minutes. Greater than 50% of time was spent in counseling and coordination of care.  Follow-Up Instructions: No follow-ups on file.   Romayne Clubs, PA-C  Note - This record has been created using Dragon software.  Chart creation errors have been sought, but may not always  have been located. Such creation errors do not reflect on  the standard of medical care.

## 2024-02-19 DIAGNOSIS — R509 Fever, unspecified: Secondary | ICD-10-CM | POA: Diagnosis not present

## 2024-02-19 DIAGNOSIS — M069 Rheumatoid arthritis, unspecified: Secondary | ICD-10-CM | POA: Diagnosis not present

## 2024-02-19 DIAGNOSIS — E559 Vitamin D deficiency, unspecified: Secondary | ICD-10-CM | POA: Diagnosis not present

## 2024-02-19 DIAGNOSIS — Z0001 Encounter for general adult medical examination with abnormal findings: Secondary | ICD-10-CM | POA: Diagnosis not present

## 2024-02-19 DIAGNOSIS — E538 Deficiency of other specified B group vitamins: Secondary | ICD-10-CM | POA: Diagnosis not present

## 2024-02-24 ENCOUNTER — Ambulatory Visit: Admitting: Physician Assistant

## 2024-02-24 DIAGNOSIS — Z8719 Personal history of other diseases of the digestive system: Secondary | ICD-10-CM

## 2024-02-24 DIAGNOSIS — M19011 Primary osteoarthritis, right shoulder: Secondary | ICD-10-CM

## 2024-02-24 DIAGNOSIS — Z79899 Other long term (current) drug therapy: Secondary | ICD-10-CM

## 2024-02-24 DIAGNOSIS — C44621 Squamous cell carcinoma of skin of unspecified upper limb, including shoulder: Secondary | ICD-10-CM

## 2024-02-24 DIAGNOSIS — M5090 Cervical disc disorder, unspecified, unspecified cervical region: Secondary | ICD-10-CM

## 2024-02-24 DIAGNOSIS — M0630 Rheumatoid nodule, unspecified site: Secondary | ICD-10-CM

## 2024-02-24 DIAGNOSIS — Z8639 Personal history of other endocrine, nutritional and metabolic disease: Secondary | ICD-10-CM

## 2024-02-24 DIAGNOSIS — M170 Bilateral primary osteoarthritis of knee: Secondary | ICD-10-CM

## 2024-02-24 DIAGNOSIS — Z8659 Personal history of other mental and behavioral disorders: Secondary | ICD-10-CM

## 2024-02-24 DIAGNOSIS — M19041 Primary osteoarthritis, right hand: Secondary | ICD-10-CM

## 2024-02-24 DIAGNOSIS — Z9289 Personal history of other medical treatment: Secondary | ICD-10-CM

## 2024-02-24 DIAGNOSIS — C119 Malignant neoplasm of nasopharynx, unspecified: Secondary | ICD-10-CM

## 2024-02-24 DIAGNOSIS — Z87891 Personal history of nicotine dependence: Secondary | ICD-10-CM

## 2024-02-24 DIAGNOSIS — M0579 Rheumatoid arthritis with rheumatoid factor of multiple sites without organ or systems involvement: Secondary | ICD-10-CM

## 2024-02-24 DIAGNOSIS — F5101 Primary insomnia: Secondary | ICD-10-CM

## 2024-02-25 NOTE — Progress Notes (Signed)
 Office Visit Note  Patient: Dustin [name]             Date of Birth: 1960-10-10           MRN: [id number]             PCP: Kathyleen Parkins, MD Referring: Kathyleen Parkins, MD Visit Date: 03/02/2024 Occupation: @GUAROCC @  Subjective:  Pain in multiple joints   History of Present Illness: Dustin [name] is a 63 y.o. male with history of seropositive rheumatoid arthritis and osteoarthritis.  Patient remains on methotrexate  8 tablets by mouth once weekly and folic acid  2 mg daily.  He is tolerating methotrexate  without any side effects and has not had any recent gaps in therapy.  Patient presents today with increased pain involving multiple joints for the past 6 weeks.  He denies any identifiable trigger for the increase in symptoms.  His pain has been most severe involving the right shoulder, both hands, and both knee joints.  He has been taking Tylenol  and ibuprofen  for symptomatic relief.  He was also provided a Medrol  Dosepak by his PCP on 02/19/2024 which provided temporary relief.  He is concerned that methotrexate  is not as effective as it once was.  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  He denies any recent or recurrent infections.    Activities of Daily Living:  Patient reports morning stiffness for 1 hour.   Patient Reports nocturnal pain.  Difficulty dressing/grooming: Denies Difficulty climbing stairs: Denies Difficulty getting out of chair: Denies Difficulty using hands for taps, buttons, cutlery, and/or writing: Reports  Review of Systems  Constitutional:  Positive for fatigue.  HENT:  Positive for mouth dryness. Negative for mouth sores.   Eyes:  Negative for dryness.  Respiratory:  Negative for shortness of breath.   Cardiovascular:  Negative for chest pain and palpitations.  Gastrointestinal:  Negative for blood in stool, constipation and diarrhea.  Endocrine: Negative for increased  urination.  Genitourinary:  Negative for involuntary urination.  Musculoskeletal:  Positive for joint pain, joint pain, joint swelling, muscle weakness and morning stiffness. Negative for gait problem, myalgias, muscle tenderness and myalgias.  Skin:  Negative for color change, hair loss and sensitivity to sunlight.  Allergic/Immunologic: Negative for susceptible to infections.  Neurological:  Negative for dizziness and headaches.  Hematological:  Negative for swollen glands.  Psychiatric/Behavioral:  Negative for depressed mood and sleep disturbance. The patient is nervous/anxious.     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/14/2017   Abscess of left olecranon bursa 11/07/2016   High risk medication use 11/07/2016   Cervical disc disease 11/07/2016   Primary osteoarthritis of both hands 11/07/2016   Primary osteoarthritis of both knees 11/07/2016   Primary osteoarthritis of both shoulders 11/07/2016   Precordial pain 01/22/2016   Pain in the chest    Hyperlipidemia 01/20/2016   Depression with anxiety  01/20/2016   GERD (gastroesophageal reflux disease) 01/20/2016   Type 2 diabetes mellitus (HCC) 01/20/2016   Chest pain at rest 09/22/2014   Tobacco use 09/22/2014   Morbid obesity (HCC) 09/22/2014   Rheumatoid arthritis (HCC) 09/22/2014   Family history of heart disease 09/22/2014   Chest pain 09/22/2014    Past Medical History:  Diagnosis Date   Anxiety    COPD (chronic  obstructive pulmonary disease) (HCC)    Depression    Essential hypertension    GERD (gastroesophageal reflux disease)    H/O hiatal hernia    History of cardiac catheterization    Normal coronaries May 2017   History of ITP    Childhood   Hypothyroidism    Osteoarthritis    Type 2 diabetes mellitus (HCC)    no meds    Family History  Problem Relation Age of Onset   Heart disease Mother    COPD Mother    Heart attack Father        Sudden death   Heart attack Sister 39   Kidney disease Sister    Heart disease Sister    Heart attack Brother    Heart disease Brother    Diabetes Brother    Heart attack Brother    Heart disease Brother 4       CABG   Heart attack Brother 4   Past Surgical History:  Procedure Laterality Date   ANTERIOR CERVICAL DECOMP/DISCECTOMY FUSION  01/21/2012   Procedure: ANTERIOR CERVICAL DECOMPRESSION/DISCECTOMY FUSION 2 LEVELS;  Surgeon: Manya Sells, MD;  Location: [facility]UR[facility];  Service: Neurosurgery;  Laterality: N/A;  Cervical Six-Seven, Cervical Seven-Thoracic One, Anterior Cervical Decompression with Fusion Interbody Prothesis Plating and Bonegraft possible posterior Cervical Seven-Thoracic One Foraminotomy   CARDIAC CATHETERIZATION  [phone]   normal coronary arteries   CARDIAC CATHETERIZATION N/A 01/22/2016   Procedure: Left Heart Cath and Coronary Angiography;  Surgeon: Odie Benne, MD;  Location: [facility] INVASIVE CV LAB;  Service: Cardiovascular;  Laterality: N/A;   CARPAL TUNNEL RELEASE Bilateral    CERVICAL DISC SURGERY  04   CHOLECYSTECTOMY N/A 10/26/2014   Procedure: LAPAROSCOPIC CHOLECYSTECTOMY;  Surgeon: Beau Bound, MD;  Location: [facility] ORS;  Service: General;  Laterality: N/A;   ESOPHAGOGASTRODUODENOSCOPY N/A 05/03/2016   Procedure: ESOPHAGOGASTRODUODENOSCOPY (EGD);  Surgeon: Ruby Corporal, MD;  Location: [facility];  Service: Endoscopy;  Laterality: N/A;  10:30   HERNIA REPAIR Right 60's   IR IMAGING GUIDED PORT  INSERTION  06/27/2021   IR REMOVAL TUN ACCESS W/ PORT W/O FL MOD SED  04/30/2022   Left elbow bursa removed     NASOPHARYNGOSCOPY N/A 06/04/2021   Procedure: NASAL ENDOSCOPY WITH BIOPSY OF NASOPHARYNX; FROZEN SECTION;  Surgeon: Janita Mellow, MD;  Location: [facility] OR;  Service: ENT;  Laterality: N/A;   TONSILLECTOMY  59   Social History   Social History Narrative   Not on file   Immunization History  Administered Date(s) Administered   Influenza-Unspecified 06/16/2022   Moderna Covid-19 Vaccine Bivalent Booster 49yrs & up 06/20/2021   Moderna Sars-Covid-2 Vaccination 12/13/2019, 01/03/2020     Objective: Vital Signs: BP 99/64 (BP Location: Left Arm, Patient Position: Sitting, Cuff Size: Normal)   Pulse 75   Ht 6' (1.829 m)   Wt 216 lb 9.6 oz (98.2 kg)   BMI 29.38 kg/m    Physical Exam Vitals and nursing note reviewed.  Constitutional:      Appearance: He is well-developed.  HENT:  Head: Normocephalic and atraumatic.   Eyes:     Conjunctiva/sclera: Conjunctivae normal.     Pupils: Pupils are equal, round, and reactive to light.    Cardiovascular:     Rate and Rhythm: Normal rate and regular rhythm.     Heart sounds: Normal heart sounds.  Pulmonary:     Effort: Pulmonary effort is normal.     Breath sounds: Normal breath sounds.  Abdominal:     General: Bowel sounds are normal.     Palpations: Abdomen is soft.   Musculoskeletal:     Cervical back: Normal range of motion and neck supple.   Skin:    General: Skin is warm and dry.     Capillary Refill: Capillary refill takes less than 2 seconds.   Neurological:     Mental Status: He is alert and oriented to person, place, and time.   Psychiatric:        Behavior: Behavior normal.      Musculoskeletal Exam: C-spine has limited range of motion bilateral rotation.  Painful range of motion of the right shoulder to about 120 degrees of abduction and discomfort with internal rotation.  Left shoulder has full range of  motion.  Elbow joints have good range of motion with no tenderness along the joint line.  Possible rheumatoid nodules noted on the extensor surface of the right elbow.  Wrist joints have good range of motion with no tenderness or synovitis.  Thickening of all MCP joints noted.  Discomfort range of motion of both knee joints.  No warmth or effusion noted.  Ankle joints have good range of motion no tenderness or joint swelling.  CDAI Exam: CDAI Score: -- Patient Global: --; Provider Global: -- Swollen: --; Tender: -- Joint Exam 03/02/2024   No joint exam has been documented for this visit   There is currently no information documented on the homunculus. Go to the Rheumatology activity and complete the homunculus joint exam.  Investigation: No additional findings.  Imaging: No results found.  Recent Labs: Lab Results  Component Value Date   WBC 4.5 12/25/2021   HGB 12.6 (L) 12/25/2021   PLT 253 12/25/2021   NA 141 12/25/2021   K 4.7 12/25/2021   CL 103 12/25/2021   CO2 26 12/25/2021   GLUCOSE 92 12/25/2021   BUN 19 12/25/2021   CREATININE 0.79 12/25/2021   BILITOT 1.0 12/25/2021   ALKPHOS 48 11/08/2021   AST 19 12/25/2021   ALT 12 12/25/2021   PROT 7.4 12/25/2021   ALBUMIN  4.0 11/08/2021   CALCIUM  9.8 12/25/2021   GFRAA 108 09/29/2020    Speciality Comments: No specialty comments available.  Procedures:  No procedures performed Allergies: Patient has no known allergies.    Assessment / Plan:     Visit Diagnoses: Rheumatoid arthritis involving multiple sites with positive rheumatoid factor (HCC) - +RF,+anti-CCP,+ANA, with nodulosis, resumed methotrexate  in June 2023 after the PET scan-no recurrence: Patient presents today with increased pain involving multiple joints for the past 6 weeks.  He has not had any recent gaps in therapy or any identifiable triggers for the flare.  He is currently experiencing increased discomfort in his right shoulder, both hands, both knee  joints.  He has also had a recurrence of rheumatoid nodules on the extensor surface of his right elbow.  He has been taking methotrexate  8 tablets by mouth once weekly and folic acid  2 mg daily.  He continues to tolerate methotrexate  without any side effects.  He has  been having to take Tylenol  and ibuprofen  for symptomatic relief.  He was also prescribed a Medrol  Dosepak by his PCP on 02/19/2024 which righted temporary relief but his symptoms have recurred.  He has tenderness and discomfort with range of motion of the right shoulder as well as some tenderness over the MCP joints which revealed synovial thickening.  No warmth or effusion was noted in the knee joints. Plan to update ESR and CRP today.   Different treatment options were discussed today.  Discussed the option of switching from oral to injectable methotrexate .  Plan to apply for Otrexup /Rasuvo  20 mg subcu days injections once weekly.  He will remain on folic acid  2 mg daily.  Discussed that injectable methotrexate  tends to be more efficacious than oral methotrexate . Discussed the concern for TNF inhibitor given history of recurrent squamous cell carcinomas as well as given history of carcinoma of the nasopharynx. A prednisone  taper starting at 20 mg tapering by 5 mg every 4 days was sent to the pharmacy today to alleviate his current symptoms. He will follow up in 3 months or sooner if needed to assess his response.  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   High risk medication use - Plan to switch from oral to injectable methotrexate  to try to improve efficacy. He will remain on folic acid  2 mg daily.  Plan to apply for Otrexup /Rasuvo  20 mg sq injections once weekly. He will discontinued oral methotrexate  tablets. (Methotrexate  was on hold from September 2022-June 2023 due to chemotherapy)-resumed methotrexate  in June 2023.  No recent gaps in therapy.  History of positive  PPD treated with INH.  Lipid panel updated on 02/19/2024. CBC and CMP  updated on 02/19/24. He will continue to require updated lab work every 3 months.  No recent or recurrent infections. Discussed the importance of holding methotrexate  if he develops signs or symptoms of an infection and to resume once the infection has completely cleared.   History of positive PPD treated with INH   Primary osteoarthritis of both hands: Patient presents today with increased pain and stiffness involving both hands.  He had no synovitis on examination today.  Synovial thickening of all MCP joints noted.  Plan to check sed rate and CRP today.  Primary osteoarthritis of both shoulders: Patient presents today with increased pain in the right shoulder.  He has limited abduction to about 120 degrees as well as discomfort and slight limitation with internal rotation.  I prednisone  taper sent to the pharmacy today.  Plan on switching the patient from oral methotrexate  to injectable methotrexate .  Primary osteoarthritis of both knees: Patient has been experiencing increased soreness and stiffness in both knee joints over the past 6 weeks.  On examination today no warmth or effusion was noted.  Plan to check sed rate and CRP today.  Cervical disc disease: C-spine has limited ROM with lateral rotation.   Other medical conditions are listed as follows:   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HCC) - dxd 09/22, CTX x 9 weeks, 01/23 started RTX x 7 weeks, CTX x 6 wks. Completed RTX 11/20/2021.  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   Orders: Orders Placed This Encounter  Procedures   Sedimentation rate   C-reactive protein  Meds ordered this encounter  Medications   predniSONE  (DELTASONE )  5 MG tablet    Sig: Take 4 tabs po x 4 days, 3  tabs po x 4 days, 2  tabs po x 4 days, 1  tab po x 4 days    Dispense:  40 tablet    Refill:  0    Follow-Up Instructions: Return in about 3 months (around 06/02/2024) for Rheumatoid arthritis, Osteoarthritis.   Romayne Clubs, PA-C  Note - This record has been created using Dragon software.  Chart creation errors have been sought, but may not always  have been located. Such creation errors do not reflect on  the standard of medical care.

## 2024-03-02 ENCOUNTER — Telehealth: Payer: Self-pay | Admitting: Rheumatology

## 2024-03-02 ENCOUNTER — Ambulatory Visit: Attending: Physician Assistant | Admitting: Physician Assistant

## 2024-03-02 ENCOUNTER — Encounter: Payer: Self-pay | Admitting: Physician Assistant

## 2024-03-02 VITALS — BP 99/64 | HR 75 | Ht 72.0 in | Wt 216.6 lb

## 2024-03-02 DIAGNOSIS — M0579 Rheumatoid arthritis with rheumatoid factor of multiple sites without organ or systems involvement: Secondary | ICD-10-CM

## 2024-03-02 DIAGNOSIS — Z8639 Personal history of other endocrine, nutritional and metabolic disease: Secondary | ICD-10-CM

## 2024-03-02 DIAGNOSIS — M19041 Primary osteoarthritis, right hand: Secondary | ICD-10-CM

## 2024-03-02 DIAGNOSIS — F5101 Primary insomnia: Secondary | ICD-10-CM

## 2024-03-02 DIAGNOSIS — M170 Bilateral primary osteoarthritis of knee: Secondary | ICD-10-CM

## 2024-03-02 DIAGNOSIS — M19012 Primary osteoarthritis, left shoulder: Secondary | ICD-10-CM

## 2024-03-02 DIAGNOSIS — Z87891 Personal history of nicotine dependence: Secondary | ICD-10-CM

## 2024-03-02 DIAGNOSIS — M0630 Rheumatoid nodule, unspecified site: Secondary | ICD-10-CM

## 2024-03-02 DIAGNOSIS — M19011 Primary osteoarthritis, right shoulder: Secondary | ICD-10-CM

## 2024-03-02 DIAGNOSIS — M19042 Primary osteoarthritis, left hand: Secondary | ICD-10-CM

## 2024-03-02 DIAGNOSIS — C119 Malignant neoplasm of nasopharynx, unspecified: Secondary | ICD-10-CM

## 2024-03-02 DIAGNOSIS — C44621 Squamous cell carcinoma of skin of unspecified upper limb, including shoulder: Secondary | ICD-10-CM

## 2024-03-02 DIAGNOSIS — Z79899 Other long term (current) drug therapy: Secondary | ICD-10-CM | POA: Diagnosis not present

## 2024-03-02 DIAGNOSIS — Z8719 Personal history of other diseases of the digestive system: Secondary | ICD-10-CM

## 2024-03-02 DIAGNOSIS — M5090 Cervical disc disorder, unspecified, unspecified cervical region: Secondary | ICD-10-CM

## 2024-03-02 DIAGNOSIS — Z8659 Personal history of other mental and behavioral disorders: Secondary | ICD-10-CM

## 2024-03-02 DIAGNOSIS — Z9289 Personal history of other medical treatment: Secondary | ICD-10-CM | POA: Diagnosis not present

## 2024-03-02 MED ORDER — PREDNISONE 5 MG PO TABS: ORAL_TABLET | ORAL | 0 refills | Status: DC

## 2024-03-02 NOTE — Telephone Encounter (Signed)
 Pt stated at his appointment he mentioned to [facility] about the knots on his elbows but it was not addressed after. Pt would like a call about what he should do for his elbow.

## 2024-03-02 NOTE — Telephone Encounter (Signed)
 Contacted patient an advised Nodules are likely rheumatoid nodules. May improve as his RA is better controlled. 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 He expressed verbal understanding.

## 2024-03-02 NOTE — Telephone Encounter (Signed)
 Nodules are likely rheumatoid nodules. May improve as his RA is better controlled.  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

## 2024-03-03 ENCOUNTER — Telehealth: Payer: Self-pay

## 2024-03-03 ENCOUNTER — Ambulatory Visit: Payer: Self-pay | Admitting: Physician Assistant

## 2024-03-03 DIAGNOSIS — M0630 Rheumatoid nodule, unspecified site: Secondary | ICD-10-CM

## 2024-03-03 DIAGNOSIS — M0579 Rheumatoid arthritis with rheumatoid factor of multiple sites without organ or systems involvement: Secondary | ICD-10-CM

## 2024-03-03 DIAGNOSIS — Z79899 Other long term (current) drug therapy: Secondary | ICD-10-CM

## 2024-03-03 LAB — C-REACTIVE PROTEIN: CRP: 3.7 mg/L (ref <8.0)

## 2024-03-03 LAB — SEDIMENTATION RATE: Sed Rate: 67 mm/h — ABNORMAL HIGH (ref 0–20)

## 2024-03-03 NOTE — Telephone Encounter (Addendum)
 Submitted a Prior Authorization request to [employer] for RASUVO  via CoverMyMeds. Will update once we receive a response.  Key: [id number]   ----- Message from CMA Jerrold Morgan V sent at 03/02/2024  4:45 PM EDT ----- Could you apply for Rasuvo /Otrexup  20mg  week per Jacinta Martinis. Please

## 2024-03-03 NOTE — Progress Notes (Signed)
 CRP WNL ESR is elevated-67.  Recommend taking prednisone  taper as prescribed.   He will also be switching to injectable methotrexate  as discussed--waiting on approval by insurance.

## 2024-03-03 NOTE — Telephone Encounter (Signed)
 Patient called the office to advise that he was approved for the medication and that he would like to have it sent to [facility]. Advised that is where it would be sent to.

## 2024-03-04 MED ORDER — RASUVO 20 MG/0.4ML ~~LOC~~ SOAJ: 20.0000 mg | SUBCUTANEOUS | 0 refills | Status: DC

## 2024-03-04 NOTE — Addendum Note (Signed)
 Addended by: Thais Fill on: 03/04/2024 08:52 AM   Modules accepted: Orders

## 2024-03-04 NOTE — Telephone Encounter (Signed)
 Received notification from [facility] regarding a prior authorization for RASUVO . Authorization has been APPROVED from 03/03/24 to 03/03/25.   Patient can fill through [facility] (rx sent here per pharmacy request)  Authorization # [id number]  Enrolled patient into Rasuvo  copay card: BIN: [id number] PCN: [id number] Group: [id number] ID: [id number]  Patient notified via MyChart  Geraldene Kleine, PharmD, MPH, BCPS, CPP Clinical Pharmacist (Rheumatology and Pulmonology)

## 2024-04-30 ENCOUNTER — Telehealth: Payer: Self-pay | Admitting: *Deleted

## 2024-04-30 NOTE — Telephone Encounter (Signed)
 Call to remind patient he sees Dr. Cloretta on 8/27 at 0940. Had to schedule the CT chest evening prior due to no availability 8/27 am. He agreed to [facility] on 05/11/24 with check in at 5:15 pm.

## 2024-05-07 DIAGNOSIS — I10 Essential (primary) hypertension: Secondary | ICD-10-CM | POA: Diagnosis not present

## 2024-05-07 DIAGNOSIS — M069 Rheumatoid arthritis, unspecified: Secondary | ICD-10-CM | POA: Diagnosis not present

## 2024-05-07 DIAGNOSIS — K219 Gastro-esophageal reflux disease without esophagitis: Secondary | ICD-10-CM | POA: Diagnosis not present

## 2024-05-07 DIAGNOSIS — Z8522 Personal history of malignant neoplasm of nasal cavities, middle ear, and accessory sinuses: Secondary | ICD-10-CM | POA: Diagnosis not present

## 2024-05-11 ENCOUNTER — Ambulatory Visit (HOSPITAL_BASED_OUTPATIENT_CLINIC_OR_DEPARTMENT_OTHER)
Admission: RE | Admit: 2024-05-11 | Discharge: 2024-05-11 | Disposition: A | Discharge status: Home / Self Care | Source: Ambulatory Visit | Attending: Oncology | Admitting: Oncology

## 2024-05-11 DIAGNOSIS — J432 Centrilobular emphysema: Secondary | ICD-10-CM | POA: Diagnosis not present

## 2024-05-11 DIAGNOSIS — I700 Atherosclerosis of aorta: Secondary | ICD-10-CM | POA: Diagnosis not present

## 2024-05-11 DIAGNOSIS — R911 Solitary pulmonary nodule: Secondary | ICD-10-CM | POA: Diagnosis not present

## 2024-05-11 DIAGNOSIS — C119 Malignant neoplasm of nasopharynx, unspecified: Secondary | ICD-10-CM | POA: Insufficient documentation

## 2024-05-12 ENCOUNTER — Inpatient Hospital Stay: Payer: BC Managed Care – PPO | Attending: Oncology | Admitting: Oncology

## 2024-05-12 VITALS — BP 96/70 | HR 62 | Temp 97.8°F | Resp 18 | Ht 72.0 in | Wt 225.5 lb

## 2024-05-12 DIAGNOSIS — D693 Immune thrombocytopenic purpura: Secondary | ICD-10-CM | POA: Insufficient documentation

## 2024-05-12 DIAGNOSIS — C119 Malignant neoplasm of nasopharynx, unspecified: Secondary | ICD-10-CM | POA: Insufficient documentation

## 2024-05-12 DIAGNOSIS — M069 Rheumatoid arthritis, unspecified: Secondary | ICD-10-CM | POA: Insufficient documentation

## 2024-05-12 DIAGNOSIS — Z79899 Other long term (current) drug therapy: Secondary | ICD-10-CM | POA: Insufficient documentation

## 2024-05-12 DIAGNOSIS — E119 Type 2 diabetes mellitus without complications: Secondary | ICD-10-CM | POA: Insufficient documentation

## 2024-05-12 DIAGNOSIS — J432 Centrilobular emphysema: Secondary | ICD-10-CM | POA: Insufficient documentation

## 2024-05-12 NOTE — Progress Notes (Signed)
 [facility] OFFICE PROGRESS NOTE   Diagnosis: Nasopharyngeal carcinoma  INTERVAL HISTORY:   [name] returns as scheduled.  He feels well.  He is working.  No neck mass.  No complaint.  No dyspnea or cough.  No recent infection.  He is now maintained on subcutaneous methotrexate  for treatment of rheumatoid arthritis.  The change to the subcutaneous route has helped his arthritis symptoms.  Objective:  Vital signs in last 24 hours:  Blood pressure 96/70, pulse 62, temperature 97.8 F (36.6 C), temperature source Temporal, resp. rate 18, height 6' (1.829 m), weight 225 lb 8 oz (102.3 kg), SpO2 97%.    HEENT: Oropharynx without visible mass, neck without mass Lymphatics: No cervical, supraclavicular, axillary, or inguinal nodes Resp: Lungs clear bilaterally, no respiratory distress Cardio: Regular rate and rhythm GI: No hepatosplenomegaly Vascular: No leg edema   Lab Results:  Lab Results  Component Value Date   WBC 4.5 12/25/2021   HGB 12.6 (L) 12/25/2021   HCT 38.5 12/25/2021   MCV 98.5 12/25/2021   PLT 253 12/25/2021   NEUTROABS 3,164 12/25/2021    CMP  Lab Results  Component Value Date   NA 141 12/25/2021   K 4.7 12/25/2021   CL 103 12/25/2021   CO2 26 12/25/2021   GLUCOSE 92 12/25/2021   BUN 19 12/25/2021   CREATININE 0.79 12/25/2021   CALCIUM  9.8 12/25/2021   PROT 7.4 12/25/2021   ALBUMIN  4.0 11/08/2021   AST 19 12/25/2021   ALT 12 12/25/2021   ALKPHOS 48 11/08/2021   BILITOT 1.0 12/25/2021   GFRNONAA >60 11/08/2021   GFRAA 108 09/29/2020    No results found for: CEA1, CEA, CAN199, CA125  Lab Results  Component Value Date   INR 1.11 01/22/2016   LABPROT 14.5 01/22/2016    Imaging:  CT Chest Wo Contrast Result Date: 05/12/2024 CLINICAL DATA:  Lung nodule. EXAM: CT CHEST WITHOUT CONTRAST TECHNIQUE: Multidetector CT imaging of the chest was performed following the standard protocol without IV contrast. RADIATION DOSE  REDUCTION: This exam was performed according to the departmental dose-optimization program which includes automated exposure control, adjustment of the mA and/or kV according to patient size and/or use of iterative reconstruction technique. COMPARISON:  11/19/2023, 11/12/2022 and PET 02/22/2022. FINDINGS: Cardiovascular: Atherosclerotic calcification of the aorta, aortic valve and coronary arteries. Heart is at the upper limits of normal in size. No pericardial effusion. Mediastinum/Nodes: No pathologically enlarged mediastinal or axillary lymph nodes. Hilar regions are difficult to definitively evaluate without IV contrast. Esophagus is grossly unremarkable. Lungs/Pleura: Centrilobular and paraseptal emphysema. Biapical pleuroparenchymal scarring. New bilateral lower lobe peribronchovascular ground-glass. Previously seen 4 mm right upper lobe nodule is no longer visualized. No pleural fluid. Debris in the airway. Upper Abdomen: Cholecystectomy. Visualized portions of the liver, adrenal glands, kidneys, spleen, pancreas, stomach and bowel are grossly unremarkable. No upper abdominal adenopathy. Musculoskeletal: Degenerative changes in the spine. IMPRESSION: 1. Resolved 4 mm right upper lobe nodule. No evidence of metastatic disease. 2. New bilateral lower lobe peribronchovascular ground-glass, likely infectious/inflammatory pneumonitis. If there is a history of immunotherapy, immune check point inhibitor related pneumonitis should be considered. 3. Aortic atherosclerosis (ICD10-I70.0). Coronary artery calcification. 4.  Emphysema (ICD10-J43.9). 5. Low-dose CT lung cancer screening is recommended for patients who are 87-13 years of age with a 20+ pack-year history of smoking and who are currently smoking or quit <=15 years ago. Electronically Signed   By: Newell Eke M.D.   On: 05/12/2024 09:15  Medications: I have reviewed the patient's current medications.   Assessment/Plan:  Nasopharyngeal cancer,  presenting with left neck adenopathy MRI cervical spine 04/27/2021-bilateral enlarged upper jugular chain lymph nodes.  Right level 2 node measures 18 mm CT neck 05/25/2021-bilateral nasopharyngeal mass eroding the skull base at the level of the clivus and inferior sphenoid sinus, enlarged right jugular digastric node into enlarged left level 2 nodes, mild soft tissue density in the inferior left sphenoid sinus-tumor? 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 MRI face 06/19/2021-extensive tumor invasion of the skull base, no trigeminal or other cranial nerve involvement, abnormal bilateral level 2 nodes PET 06/19/2021-decrease size of nasopharyngeal mass compared to 05/25/2021 CT, invasion of the clivus, single bilateral level 2A lymph nodes mildly enlarged and hypermetabolic, no evidence of distant metastatic disease Cycle 1 gemcitabine /carboplatin  07/06/2021, day 8 gemcitabine  07/05/2021 Cycle 2 gemcitabine /carboplatin  07/27/2021 Cycle 3 gemcitabine /carboplatin  08/17/2021 PET scan 09/14/2021-decrease in size and degree of FDG uptake associated with FDG avid posterior nasopharyngeal lesion.  Residual FDG uptake along the midline of the posterior nasopharynx.  Persistent but improved asymmetric increased uptake within the right tonsillar region.  Resolution of previous FDG avid cervical lymph nodes. Radiation to primary site and bilateral neck beginning 10/03/2021, completed 11/20/2021 Concurrent weekly carboplatin  beginning 10/04/2021, 6 weekly cycles completed 11/08/2021 ENT exam 01/24/2022-no evidence of disease PET 02/22/2022-no residual asymmetric or focal nasopharyngeal hypermetabolism, no residual tonsillar hypermetabolism no evidence for hypermetabolic lymphadenopathy in the neck, no new site of suspicious hypermetabolism CTs 11/13/2022 neck and chest-marked decrease in the size of the previously seen nasopharyngeal mass.  Mild residual mucosal thickening likely secondary to  treatment changes.  Complete resolution of previously seen bilateral level 2 lymphadenopathy.  No newly enlarged cervical lymph nodes.  No evidence of metastatic disease in the chest.  Emphysema and mild diffuse bilateral bronchial wall thickening.  CAD. CT neck 11/19/2023: No evidence of residual or recurrent nasopharyngeal carcinoma CT chest 11/19/2023: New 4 mm right upper lobe nodule, new peribronchovascular nodularity left lower lobe CT chest 05/11/2024: New bilateral lower lobe peribronchovascular groundglass change, previous 4 mm right upper lobe nodule not visualized,  Rheumatoid arthritis Diabetes COPD Tobacco use Hypothyroid Remote history ITP, age 67 Mild left foot drop 03/08/2022-resolved   Disposition: Dustin [name] remains in clinical remission from nasopharyngeal carcinoma.  The chest CT 05/11/2024 revealed new bilateral lower lobe groundglass changes.  He denies dyspnea and cough.  He does not have symptoms of an infection.  It is possible the CT findings are related to methotrexate  therapy.  I will forward the CT report to his rheumatologist.  I recommended Dustin [name] schedule a follow-up visit with Dr. Jesus for head and neck surveillance. Dustin [name] will return for an office visit in 6 months.  Dustin Hof, MD  05/12/2024  10:31 AM

## 2024-05-13 ENCOUNTER — Ambulatory Visit: Payer: Self-pay | Admitting: Rheumatology

## 2024-05-13 ENCOUNTER — Other Ambulatory Visit: Payer: Self-pay

## 2024-05-13 ENCOUNTER — Telehealth: Payer: Self-pay | Admitting: Pulmonary Disease

## 2024-05-13 DIAGNOSIS — M0630 Rheumatoid nodule, unspecified site: Secondary | ICD-10-CM

## 2024-05-13 DIAGNOSIS — Z79899 Other long term (current) drug therapy: Secondary | ICD-10-CM

## 2024-05-13 DIAGNOSIS — J984 Other disorders of lung: Secondary | ICD-10-CM

## 2024-05-13 DIAGNOSIS — M0579 Rheumatoid arthritis with rheumatoid factor of multiple sites without organ or systems involvement: Secondary | ICD-10-CM

## 2024-05-13 MED ORDER — RASUVO 20 MG/0.4ML ~~LOC~~ SOAJ: 20.0000 mg | SUBCUTANEOUS | 0 refills | Status: DC

## 2024-05-13 NOTE — Progress Notes (Signed)
 Please review and sign pended urgent pulmonology referral. Thanks!   Patient also requested lab orders for [facility] in [place] and rasuvo  refill.   Last Fill: 03/04/2024  Labs: per office note on 03/02/2024: CBC and CMP  updated on 02/19/24.   Next Visit: 06/01/2024  Last Visit: 03/02/2024  DX: Rheumatoid arthritis involving multiple sites with positive rheumatoid factor   Current Dose per office note on 03/02/2024: Rasuvo  20 mg sq injections once weekly.   Okay to refill Rasuvo ?  Please sign pended lab orders for CBC, CMP and ESR and standing lab orders for [facility].

## 2024-05-13 NOTE — Progress Notes (Signed)
 Please refer him to ILD clinic -urgent referral.

## 2024-05-13 NOTE — Telephone Encounter (Signed)
 PT has a referral on file with the following notes. All ILD Doctor's are full unless I can use a HFU slot on 9/17 of Dr. Jiles. I wanted to run it bu Triage to see if we can make room for him. Referral notes say the following: ILD Clinic- urgent referral. On MTX for RA. Recent CT results in Epic (showing pneumonitis). 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

## 2024-05-13 NOTE — Telephone Encounter (Unsigned)
 Copied from CRM #[id number]. Topic: Appointments - Scheduling Inquiry for Clinic >> May 13, 2024  3:43 PM Corean SAUNDERS wrote: Reason for CRM: Patient will be awaiting a call from the clinic as there is an urgent referral in chart, [facility] advised E2C[facility] agent that they are awaiting providers to possibly open sooner appointments but patient advised agent that he can only come to the clinic any time on a Tuesday or or Wednesday morning.

## 2024-05-14 NOTE — Telephone Encounter (Signed)
 Spoke to patient and offered appt for 05/18/2024 at 1:30.  Con, please schedule, as I am unable to do so.

## 2024-05-14 NOTE — Telephone Encounter (Signed)
-----   Message from [facility] sent at 05/13/2024  4:53 PM EDT ----- Got a message from Dr. Dolphus for an urgent referral Can you schedule him for Tuesday on an open same-day slot.  Okay for 15-minute visit.  Praveen ----- Message ----- From: Dolphus Reiter, MD Sent: 05/12/2024   4:38 PM EDT To: Praveen Mannam, MD  Hi Praveen, Would you kindly review the CT scan of this patient and let me know if he needs an urgent referral or a routine referral to rule out ILD.  His oncologist is concerned about the CT scan findings.  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  Had a surveillance chest CT this week, there are new groundglass opacities at the bilateral lower lungs.  He does not have symptoms of pneumonia.  Could the CT changes be related to methotrexate ?  He was recently changed to subcutaneous methotrexate   Please review the images and let me know if a follow-up CT or other evaluation should be considered  He is scheduled to return here in 6 months.  Thanks,  Arvella Cloretta

## 2024-05-14 NOTE — Telephone Encounter (Signed)
 Patient is scheduled

## 2024-05-18 ENCOUNTER — Ambulatory Visit (INDEPENDENT_AMBULATORY_CARE_PROVIDER_SITE_OTHER): Admitting: Pulmonary Disease

## 2024-05-18 ENCOUNTER — Encounter: Payer: Self-pay | Admitting: Pulmonary Disease

## 2024-05-18 VITALS — BP 124/74 | HR 67 | Temp 97.8°F | Ht 73.0 in | Wt 233.0 lb

## 2024-05-18 DIAGNOSIS — R918 Other nonspecific abnormal finding of lung field: Secondary | ICD-10-CM | POA: Diagnosis not present

## 2024-05-18 DIAGNOSIS — J439 Emphysema, unspecified: Secondary | ICD-10-CM | POA: Diagnosis not present

## 2024-05-18 NOTE — Patient Instructions (Signed)
 We will order follow-up CT scan and lung function test in 3 months to reevaluate the mild inflammation in the lung and emphysema

## 2024-05-18 NOTE — Progress Notes (Signed)
 Office Visit Note  Patient: Dustin [name]             Date of Birth: April 13, 1961           MRN: [id number]             PCP: Patient, No Pcp Per Referring: Bertell Satterfield, MD Visit Date: 06/01/2024 Occupation: @GUAROCC @  Subjective:  Discuss medication options   History of Present Illness: Dustin [name] is a 63 y.o. male with history of seropositive rheumatoid arthritis and osteoarthritis.  He is on rasuvo  20 mg sq injections every week and folic acid  2 mg daily.  Patient states that he has not noticed any improvements in switching from oral to injectable methotrexate .  He continues to experience generalized joint stiffness.  He states he also had a recent flare involving his right hand and wrist.  Patient would like to discuss treatment alternatives.  Patient reports that he was previously on a higher dose of methotrexate  and would like to discuss trying an increased dose to better manage his symptoms.     Activities of Daily Living:  Patient reports joint stiffness lasting all day  Patient Denies nocturnal pain.  Difficulty dressing/grooming: Denies Difficulty climbing stairs: Reports Difficulty getting out of chair: Reports Difficulty using hands for taps, buttons, cutlery, and/or writing: Reports  Review of Systems  Constitutional:  Positive for fatigue.  HENT:  Negative for mouth sores and mouth dryness.   Eyes:  Negative for dryness.  Respiratory:  Positive for shortness of breath.   Cardiovascular:  Negative for chest pain and palpitations.  Gastrointestinal:  Negative for blood in stool, constipation and diarrhea.  Endocrine: Negative for increased urination.  Genitourinary:  Negative for involuntary urination.  Musculoskeletal:  Positive for joint pain, joint pain, joint swelling, myalgias, muscle weakness, morning stiffness and myalgias. Negative for gait problem and muscle tenderness.  Skin:  Positive for color change. Negative for rash, hair loss and sensitivity to  sunlight.  Allergic/Immunologic: Negative for susceptible to infections.  Neurological:  Positive for headaches. Negative for dizziness.  Hematological:  Negative for swollen glands.  Psychiatric/Behavioral:  Positive for sleep disturbance. Negative for depressed mood. The patient is nervous/anxious.     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/14/2017   Abscess of left olecranon bursa 11/07/2016   High risk medication use 11/07/2016   Cervical disc disease 11/07/2016   Primary osteoarthritis of both hands 11/07/2016   Primary osteoarthritis of both knees 11/07/2016   Primary osteoarthritis of both shoulders 11/07/2016   Precordial pain 01/22/2016   Pain in the chest    Hyperlipidemia 01/20/2016   Depression with anxiety 01/20/2016   GERD (gastroesophageal reflux disease) 01/20/2016   Type 2 diabetes mellitus (HCC) 01/20/2016   Chest pain at rest 09/22/2014   Tobacco use 09/22/2014   Morbid obesity (HCC) 09/22/2014   Rheumatoid arthritis (HCC) 09/22/2014   Family history of heart disease 09/22/2014   Chest pain 09/22/2014  Past Medical History:  Diagnosis Date   Anxiety    COPD (chronic obstructive pulmonary disease) (HCC)    Depression    Essential hypertension    GERD (gastroesophageal reflux disease)    H/O hiatal hernia    History of cardiac catheterization    Normal coronaries May 2017   History of ITP    Childhood   Hypothyroidism    Osteoarthritis    Type 2  diabetes mellitus (HCC)    no meds    Family History  Problem Relation Age of Onset   Heart disease Mother    COPD Mother    Heart attack Father        Sudden death   Heart attack Sister 100   Kidney disease Sister    Heart disease Sister    Heart attack Brother    Heart disease Brother    Diabetes Brother    Heart attack Brother    Heart disease Brother 87       CABG   Heart attack Brother 21   Past Surgical History:  Procedure Laterality Date   ANTERIOR CERVICAL DECOMP/DISCECTOMY FUSION  01/21/2012   Procedure: ANTERIOR CERVICAL DECOMPRESSION/DISCECTOMY FUSION 2 LEVELS;  Surgeon: Fairy Levels, MD;  Location: [facility] ORS;  Service: Neurosurgery;  Laterality: N/A;  Cervical Six-Seven, Cervical Seven-Thoracic One, Anterior Cervical Decompression with Fusion Interbody Prothesis Plating and Bonegraft possible posterior Cervical Seven-Thoracic One Foraminotomy   CARDIAC CATHETERIZATION  [phone]   normal coronary arteries   CARDIAC CATHETERIZATION N/A 01/22/2016   Procedure: Left Heart Cath and Coronary Angiography;  Surgeon: Lonni JONETTA Cash, MD;  Location: [facility] INVASIVE CV LAB;  Service: Cardiovascular;  Laterality: N/A;   CARPAL TUNNEL RELEASE Bilateral    CERVICAL DISC SURGERY  04   CHOLECYSTECTOMY N/A 10/26/2014   Procedure: LAPAROSCOPIC CHOLECYSTECTOMY;  Surgeon: Oneil DELENA Budge, MD;  Location: [facility] ORS;  Service: General;  Laterality: N/A;   ESOPHAGOGASTRODUODENOSCOPY N/A 05/03/2016   Procedure: ESOPHAGOGASTRODUODENOSCOPY (EGD);  Surgeon: Claudis RAYMOND Rivet, MD;  Location: [facility] END[facility] SUITE;  Service: Endoscopy;  Laterality: N/A;  10:30   HERNIA REPAIR Right 60's   IR IMAGING GUIDED PORT INSERTION  06/27/2021   IR REMOVAL TUN ACCESS W/ PORT W/O FL MOD SED  04/30/2022   Left elbow bursa removed     NASOPHARYNGOSCOPY N/A 06/04/2021   Procedure: NASAL ENDOSCOPY WITH BIOPSY OF NASOPHARYNX; FROZEN SECTION;  Surgeon: Jesus Oliphant, MD;  Location[facility];  Service: ENT;  Laterality: N/A;    TONSILLECTOMY  51   Social History   Social History Narrative   Not on file   Immunization History  Administered Date(s) Administered   Influenza Inj Mdck Quad Pf 06/15/2018   Influenza, Seasonal, Injecte, Preservative Fre 07/19/2016, 06/15/2017   Influenza-Unspecified 06/16/2022   Moderna Covid-19 Vaccine Bivalent Booster 52yrs & up 06/20/2021   Moderna Sars-Covid-2 Vaccination 12/13/2019, 01/03/2020   Pneumococcal Polysaccharide-23 01/25/2019     Objective: Vital Signs: BP 107/72   Pulse 86   Temp 98 F (36.7 C)   Resp 14   Ht 6' 1 (1.854 m)   Wt 228 lb 12.8 oz (103.8 kg)   BMI 30.19 kg/m    Physical Exam Vitals and nursing note reviewed.  Constitutional:      Appearance: He is well-developed.  HENT:     Head: Normocephalic and atraumatic.  Eyes:     Conjunctiva/sclera: Conjunctivae normal.     Pupils: Pupils are equal, round, and reactive to light.  Cardiovascular:  Rate and Rhythm: Normal rate and regular rhythm.     Heart sounds: Normal heart sounds.  Pulmonary:     Effort: Pulmonary effort is normal.     Breath sounds: Normal breath sounds.  Abdominal:     General: Bowel sounds are normal.     Palpations: Abdomen is soft.  Musculoskeletal:     Cervical back: Normal range of motion and neck supple.  Skin:    General: Skin is warm and dry.     Capillary Refill: Capillary refill takes less than 2 seconds.  Neurological:     Mental Status: He is alert and oriented to person, place, and time.  Psychiatric:        Behavior: Behavior normal.      Musculoskeletal Exam: C-spine has limited range of motion with lateral rotation. Thoracic kyphosis.   lumbar spine have good range of motion.  No midline spinal tenderness.  No SI joint tenderness.  Shoulder joints, elbow joints, wrist joints, MCPs, PIPs, DIPs have good range of motion with no synovitis.  Complete fist formation bilaterally.  Thickening of MCP joints with tenderness over the right 1st and 2nd MCP  joints.  Tenderness of the right wrist noted.  Hip joints have good range of motion with no groin pain.  Knee joints have good range of motion no warmth or effusion.  Ankle joints have good range of motion no tenderness or joint swelling.  No evidence of Achilles tendinitis or plantar fasciitis.   CDAI Exam: CDAI Score: -- Patient Global: --; Provider Global: -- Swollen: --; Tender: -- Joint Exam 06/01/2024   No joint exam has been documented for this visit   There is currently no information documented on the homunculus. Go to the Rheumatology activity and complete the homunculus joint exam.  Investigation: No additional findings.  Imaging: CT Chest Wo Contrast Result Date: 05/12/2024 CLINICAL DATA:  Lung nodule. EXAM: CT CHEST WITHOUT CONTRAST TECHNIQUE: Multidetector CT imaging of the chest was performed following the standard protocol without IV contrast. RADIATION DOSE REDUCTION: This exam was performed according to the departmental dose-optimization program which includes automated exposure control, adjustment of the mA and/or kV according to patient size and/or use of iterative reconstruction technique. COMPARISON:  11/19/2023, 11/12/2022 and PET 02/22/2022. FINDINGS: Cardiovascular: Atherosclerotic calcification of the aorta, aortic valve and coronary arteries. Heart is at the upper limits of normal in size. No pericardial effusion. Mediastinum/Nodes: No pathologically enlarged mediastinal or axillary lymph nodes. Hilar regions are difficult to definitively evaluate without IV contrast. Esophagus is grossly unremarkable. Lungs/Pleura: Centrilobular and paraseptal emphysema. Biapical pleuroparenchymal scarring. New bilateral lower lobe peribronchovascular ground-glass. Previously seen 4 mm right upper lobe nodule is no longer visualized. No pleural fluid. Debris in the airway. Upper Abdomen: Cholecystectomy. Visualized portions of the liver, adrenal glands, kidneys, spleen, pancreas, stomach  and bowel are grossly unremarkable. No upper abdominal adenopathy. Musculoskeletal: Degenerative changes in the spine. IMPRESSION: 1. Resolved 4 mm right upper lobe nodule. No evidence of metastatic disease. 2. New bilateral lower lobe peribronchovascular ground-glass, likely infectious/inflammatory pneumonitis. If there is a history of immunotherapy, immune check point inhibitor related pneumonitis should be considered. 3. Aortic atherosclerosis (ICD10-I70.0). Coronary artery calcification. 4.  Emphysema (ICD10-J43.9). 5. Low-dose CT lung cancer screening is recommended for patients who are 43-35 years of age with a 20+ pack-year history of smoking and who are currently smoking or quit <=15 years ago. Electronically Signed   By: Newell Eke M.D.   On: 05/12/2024 09:15  Recent Labs: Lab Results  Component Value Date   WBC 3.4 05/25/2024   HGB 12.6 (L) 05/25/2024   PLT 157 05/25/2024   NA 139 05/25/2024   K 4.6 05/25/2024   CL 100 05/25/2024   CO2 26 05/25/2024   GLUCOSE 159 (H) 05/25/2024   BUN 16 05/25/2024   CREATININE 0.87 05/25/2024   BILITOT 1.2 05/25/2024   ALKPHOS 70 05/25/2024   AST 23 05/25/2024   ALT 20 05/25/2024   PROT 6.9 05/25/2024   ALBUMIN  4.0 05/25/2024   CALCIUM  9.2 05/25/2024   GFRAA 108 09/29/2020    Speciality Comments: No specialty comments available.  Procedures:  No procedures performed Allergies: Patient has no known allergies.   Assessment / Plan:     Visit Diagnoses: Rheumatoid arthritis involving multiple sites with positive rheumatoid factor (HCC) - +RF,+anti-CCP,+ANA, with nodulosis, resumed methotrexate  in June 2023 after the PET scan-no recurrence: Patient continues to have diffuse joint stiffness as well as a recent flare involving the right wrist and right hand.  He has also been experiencing frequent hand cramping.  He has not noticed any benefit since switching from oral to injectable methotrexate  after his last office visit on 03/02/2024.  He  has tolerated Rasuvo  without any side effects or injection site reactions.  Different treatment options were discussed today in detail.  Discussed the option of increasing the dose of Rasuvo  from 20 mg to 25 mg once weekly with close lab monitoring.  Discussed that he will require updated lab work 2 to 3 weeks after increasing the dose of Rasuvo --he voiced understanding.  Standing orders for CBC and CMP remain in place for [facility]. The patient plans on taking his last dose of 20 mg Rasuvo  this weekend and then will initiate the 25 mg dose the following Saturday.  He will notify us  if his symptoms persist or worsen.  He plans on keeping his follow-up visit scheduled with Dr. Dolphus on 07/27/2024.  High risk medication use: Plan to increase rasuvo  25 mg sq injections once weekly.   CBC and CMP drawn on 05/25/2024. He will require updated lab work in 2-3 weeks after increasing the dose of Rasuvo .  ESR within normal limits at 05/25/24. He will remain on folic acid  2 mg daily.  (Methotrexate  was on hold from September 2022-June 2023 due to chemotherapy)-resumed methotrexate  in June 2023.  No recent gaps in therapy.  History of positive PPD treated with INH.  Lipid panel updated on 02/19/2024.  Pneumonitis: Reviewed Dr. Jiles office visit note from 05/18/2024: CT scan shows very minimal ground glass opacities at the lung base which appears nonspecific.  Dr. Theophilus dictated I am not entirely convinced this is either methotrexate  toxicity or ILD from rheumatoid arthritis.   He is asymptomatic.  No medication changes will be made at this time.  Rheumatoid nodulosis (HCC): Resolved  History of positive PPD treated with INH   Primary osteoarthritis of both hands: PIP and DIP thickening noted.  Synovial thickening of all MCP joints with tenderness of the right wrist, right first MCP joint, and right second MCP joint.  Primary osteoarthritis of both shoulders: He has good range of motion of both hip joints  on examination today with some discomfort and stiffness.  Primary osteoarthritis of both knees: No warmth or effusion noted.  Cervical disc disease: C-spine has limited range of motion with lateral rotation.  Other medical conditions are listed as follows:  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HCC) - dxd 09/22, CTX x 9 weeks, 01/23  started RTX x 7 weeks, CTX x 6 wks. Completed RTX 11/20/2021.  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  No orders of the defined types were placed in this encounter.    Follow-Up Instructions: Return for Rheumatoid arthritis.   Waddell CHRISTELLA Craze, PA-C  Note - This record has been created using Dragon software.  Chart creation errors have been sought, but may not always  have been located. Such creation errors do not reflect on  the standard of medical care.

## 2024-05-18 NOTE — Progress Notes (Unsigned)
 JAXSON ANGLIN    [id number]    [birth year]  Primary Care Physician:Fusco, Jerilynn, MD  Referring Physician: Dolphus Reiter, MD [address] [place],  [place] [address]  Chief complaint: Consult for ILD  HPI: 63 y.o. who  has a past medical history of Anxiety, COPD (chronic obstructive pulmonary disease) (HCC), Depression, Essential hypertension, GERD (gastroesophageal reflux disease), H/O hiatal hernia, History of cardiac catheterization, History of ITP, Hypothyroidism, Osteoarthritis, and Type 2 diabetes mellitus (HCC).  Discussed the use of AI scribe software for clinical note transcription with the patient, who gave verbal consent to proceed.  History of Present Illness 63 year old with history of nasopharyngeal carcinoma, rheumatoid arthritis on methotrexate  for close to 20 years.  He follows with Dr. Dolphus.  He was previously on p.o. methotrexate  20 mg/week but was changed to IV methotrexate  20 mg/week for better effectiveness in June 2026 He had a recent CT scan which showed mild lower lobe ground glass opacities and has been referred here for evaluation of interstitial lung disease, possible methotrexate  lung toxicity.  He feels well with no complaints.  No symptoms of dyspnea, cough, mucus production.  Relevant Pulmonary history: Pets: Dog Occupation: Truck travel Exposures: No mold, hot tub, Jacuzzi.  No feather pillows or comforters No h/o chemo/XRT/amiodarone/macrodantin/MTX  No exposure to asbestos, silica or other organic allergens  Smoking history: 50-pack-year smoker.  Quit in June 2025 Travel history: Previously lived in [place] .  No significant recent travel Family history: No family history of lung disease   Outpatient Encounter Medications as of 05/18/2024  Medication Sig   ALPRAZolam  (XANAX ) 1 MG tablet Take 1 mg by mouth 3 (three) times daily as needed for anxiety.   atorvastatin  (LIPITOR) 10 MG tablet Take 10 mg by mouth daily.    Blood Glucose Monitoring Suppl (ONETOUCH VERIO FLEX SYSTEM) w/Device KIT daily. as directed   folic acid  (FOLVITE ) 1 MG tablet Take 1 mg by mouth 2 (two) times daily.   levothyroxine  (SYNTHROID ) 50 MCG tablet Take 50 mcg by mouth at bedtime.   Methotrexate , PF, (RASUVO ) 20 MG/0.4ML SOAJ Inject 20 mg into the skin once a week.   Multiple Vitamin (MULTIVITAMIN) tablet Take 1 tablet by mouth daily. (Patient not taking: Reported on 05/12/2024)   nicotine  (NICODERM CQ  - DOSED IN MG/24 HOURS) 21 mg/24hr patch Place 21 mg onto the skin daily.   ONETOUCH VERIO test strip daily. (Patient not taking: Reported on 05/12/2024)   pantoprazole  (PROTONIX ) 40 MG tablet Take 40 mg by mouth at bedtime.   sildenafil (VIAGRA) 25 MG tablet Take 25 mg by mouth daily as needed for erectile dysfunction.   No facility-administered encounter medications on file as of 05/18/2024.     Physical Exam: There were no vitals filed for this visit. There is no height or weight on file to calculate BMI.  Physical Exam Blood pressure 124/74, pulse 67, temperature 97.8 F (36.6 C), temperature source Temporal, height 6' 1 (1.854 m), weight 233 lb (105.7 kg), SpO2 96%. Gen:      No acute distress HEENT:  EOMI, sclera anicteric Neck:     No masses; no thyromegaly Lungs:    Clear to auscultation bilaterally; normal respiratory effort CV:         Regular rate and rhythm; no murmurs Abd:      + bowel sounds; soft, non-tender; no palpable masses, no distension Ext:    No edema; adequate peripheral perfusion Neuro: alert and oriented x  3 Psych: normal mood and affect     Data Reviewed: Imaging: CT chest 11/19/2023-mild emphysema, peribronchovascular nodularity in the left lower lobe.  4 mm right upper lobe nodule  CT chest 05/12/2024-resolved pulm millimeter right upper lobe nodule.  Mild lower lobe peribronchovascular ground glass, emphysema I have reviewed the images personally.  PFTs:  Labs:  Assessment & Plan Abnormal  CT Has history of rheumatoid arthritis and on methotrexate  for past 20 years.  Recently changed from p.o. to IV. I have reviewed his CT scan which shows very minimal ground glass opacities at the lung base which appears nonspecific.  I am not entirely convinced this is either methotrexate  toxicity or ILD from rheumatoid arthritis.  He is totally asymptomatic and we can continue to monitor for now.  I will get a follow-up high-resolution CT in 3 months and PFTs for assessment of lung function  Emphysema Likely has COPD from heavy smoking history.  Does not need inhalers at present as he is asymptomatic Will review for airway obstruction on PFTs  Recommendations: Follow-up high-res CT, PFTs  Lonna Coder MD [facility] 05/18/2024, 1:29 PM  CC: Dolphus Reiter, MD

## 2024-05-25 DIAGNOSIS — M0579 Rheumatoid arthritis with rheumatoid factor of multiple sites without organ or systems involvement: Secondary | ICD-10-CM | POA: Diagnosis not present

## 2024-05-25 DIAGNOSIS — Z79899 Other long term (current) drug therapy: Secondary | ICD-10-CM | POA: Diagnosis not present

## 2024-05-26 ENCOUNTER — Ambulatory Visit: Payer: Self-pay | Admitting: Rheumatology

## 2024-05-26 LAB — CBC WITH DIFFERENTIAL/PLATELET
Basophils Absolute: 0 x10E3/uL (ref 0.0–0.2)
Basos: 1 %
EOS (ABSOLUTE): 0 x10E3/uL (ref 0.0–0.4)
Eos: 1 %
Hematocrit: 39.1 % (ref 37.5–51.0)
Hemoglobin: 12.6 g/dL — ABNORMAL LOW (ref 13.0–17.7)
Immature Grans (Abs): 0 x10E3/uL (ref 0.0–0.1)
Immature Granulocytes: 0 %
Lymphocytes Absolute: 2.1 x10E3/uL (ref 0.7–3.1)
Lymphs: 62 %
MCH: 33.1 pg — ABNORMAL HIGH (ref 26.6–33.0)
MCHC: 32.2 g/dL (ref 31.5–35.7)
MCV: 103 fL — ABNORMAL HIGH (ref 79–97)
Monocytes Absolute: 0.4 x10E3/uL (ref 0.1–0.9)
Monocytes: 10 %
Neutrophils Absolute: 0.9 x10E3/uL — ABNORMAL LOW (ref 1.4–7.0)
Neutrophils: 26 %
Platelets: 157 x10E3/uL (ref 150–450)
RBC: 3.81 x10E6/uL — ABNORMAL LOW (ref 4.14–5.80)
RDW: 13.1 % (ref 11.6–15.4)
WBC: 3.4 x10E3/uL (ref 3.4–10.8)

## 2024-05-26 LAB — COMPREHENSIVE METABOLIC PANEL WITH GFR
ALT: 20 IU/L (ref 0–44)
AST: 23 IU/L (ref 0–40)
Albumin: 4 g/dL (ref 3.9–4.9)
Alkaline Phosphatase: 70 IU/L (ref 44–121)
BUN/Creatinine Ratio: 18 (ref 10–24)
BUN: 16 mg/dL (ref 8–27)
Bilirubin Total: 1.2 mg/dL (ref 0.0–1.2)
CO2: 26 mmol/L (ref 20–29)
Calcium: 9.2 mg/dL (ref 8.6–10.2)
Chloride: 100 mmol/L (ref 96–106)
Creatinine, Ser: 0.87 mg/dL (ref 0.76–1.27)
Globulin, Total: 2.9 g/dL (ref 1.5–4.5)
Glucose: 159 mg/dL — ABNORMAL HIGH (ref 70–99)
Potassium: 4.6 mmol/L (ref 3.5–5.2)
Sodium: 139 mmol/L (ref 134–144)
Total Protein: 6.9 g/dL (ref 6.0–8.5)
eGFR: 96 mL/min/1.73 (ref >59)

## 2024-05-26 LAB — SEDIMENTATION RATE: Sed Rate: 30 mm/h (ref 0–30)

## 2024-05-26 NOTE — Progress Notes (Signed)
 FYI, patient was evaluated by pulmonologist.  The findings are not suggestive of methotrexate  toxicity.  The ground glass changes are mild and no treatment was advised.

## 2024-05-26 NOTE — Progress Notes (Signed)
 Hemoglobin is low and stable, glucose is elevated at 159.  Sed rate is normal.  Please notify patient and forward results to his PCP.

## 2024-06-01 ENCOUNTER — Other Ambulatory Visit: Payer: Self-pay

## 2024-06-01 ENCOUNTER — Ambulatory Visit: Attending: Physician Assistant | Admitting: Physician Assistant

## 2024-06-01 ENCOUNTER — Encounter: Payer: Self-pay | Admitting: Physician Assistant

## 2024-06-01 VITALS — BP 107/72 | HR 86 | Temp 98.0°F | Resp 14 | Ht 73.0 in | Wt 228.8 lb

## 2024-06-01 DIAGNOSIS — M0630 Rheumatoid nodule, unspecified site: Secondary | ICD-10-CM | POA: Diagnosis not present

## 2024-06-01 DIAGNOSIS — Z79899 Other long term (current) drug therapy: Secondary | ICD-10-CM

## 2024-06-01 DIAGNOSIS — M0579 Rheumatoid arthritis with rheumatoid factor of multiple sites without organ or systems involvement: Secondary | ICD-10-CM

## 2024-06-01 DIAGNOSIS — M19011 Primary osteoarthritis, right shoulder: Secondary | ICD-10-CM

## 2024-06-01 DIAGNOSIS — M19041 Primary osteoarthritis, right hand: Secondary | ICD-10-CM

## 2024-06-01 DIAGNOSIS — Z9289 Personal history of other medical treatment: Secondary | ICD-10-CM

## 2024-06-01 DIAGNOSIS — C119 Malignant neoplasm of nasopharynx, unspecified: Secondary | ICD-10-CM

## 2024-06-01 DIAGNOSIS — M170 Bilateral primary osteoarthritis of knee: Secondary | ICD-10-CM

## 2024-06-01 DIAGNOSIS — J984 Other disorders of lung: Secondary | ICD-10-CM | POA: Diagnosis not present

## 2024-06-01 DIAGNOSIS — C44621 Squamous cell carcinoma of skin of unspecified upper limb, including shoulder: Secondary | ICD-10-CM

## 2024-06-01 DIAGNOSIS — M19012 Primary osteoarthritis, left shoulder: Secondary | ICD-10-CM

## 2024-06-01 DIAGNOSIS — Z8639 Personal history of other endocrine, nutritional and metabolic disease: Secondary | ICD-10-CM

## 2024-06-01 DIAGNOSIS — Z8719 Personal history of other diseases of the digestive system: Secondary | ICD-10-CM

## 2024-06-01 DIAGNOSIS — Z8659 Personal history of other mental and behavioral disorders: Secondary | ICD-10-CM

## 2024-06-01 DIAGNOSIS — M19042 Primary osteoarthritis, left hand: Secondary | ICD-10-CM

## 2024-06-01 DIAGNOSIS — F5101 Primary insomnia: Secondary | ICD-10-CM

## 2024-06-01 DIAGNOSIS — M5090 Cervical disc disorder, unspecified, unspecified cervical region: Secondary | ICD-10-CM

## 2024-06-01 DIAGNOSIS — Z87891 Personal history of nicotine dependence: Secondary | ICD-10-CM

## 2024-06-01 MED ORDER — RASUVO 20 MG/0.4ML ~~LOC~~ SOAJ: 25.0000 mg | SUBCUTANEOUS | 0 refills | Status: DC

## 2024-06-01 NOTE — Progress Notes (Unsigned)
 Patient in office today. Rasuvo  dose increase to 25 mg/wk. Please review and sign.

## 2024-06-01 NOTE — Patient Instructions (Signed)
 Standing Labs We placed an order today for your standing lab work.   Please have your standing labs drawn in 2-3 weeks  Please have your labs drawn 2 weeks prior to your appointment so that the provider can discuss your lab results at your appointment, if possible.  Please note that you may see your imaging and lab results in MyChart before we have reviewed them. We will contact you once all results are reviewed. Please allow our office up to 72 hours to thoroughly review all of the results before contacting the office for clarification of your results.  WALK-IN LAB HOURS  Monday through Thursday from 8:00 am -12:30 pm and 1:00 pm-4:30 pm and Friday from 8:00 am-12:00 pm.  Patients with office visits requiring labs will be seen before walk-in labs.  You may encounter longer than normal wait times. Please allow additional time. Wait times may be shorter on  Monday and Thursday afternoons.  We do not book appointments for walk-in labs. We appreciate your patience and understanding with our staff.   Labs are drawn by [facility]. Please bring your co-pay at the time of your lab draw.  You may receive a bill from [facility] for your lab work.  Please note if you are on Hydroxychloroquine and and an order has been placed for a Hydroxychloroquine level,  you will need to have it drawn 4 hours or more after your last dose.  If you wish to have your labs drawn at another location, please call the office 24 hours in advance so we can fax the orders.  The office is located at [address], [place], [place] [address]   If you have any questions regarding directions or hours of operation,  please call [phone].   As a reminder, please drink plenty of water  prior to coming for your lab work. Thanks!

## 2024-06-03 ENCOUNTER — Other Ambulatory Visit: Payer: Self-pay

## 2024-06-03 MED ORDER — RASUVO 25 MG/0.5ML ~~LOC~~ SOAJ: 25.0000 mg | SUBCUTANEOUS | 0 refills | Status: AC

## 2024-06-03 NOTE — Progress Notes (Unsigned)
 Received a fax from [facility] for a clarification request for the patient's Rasuvo . Patient's dose was increased to 25 mg/wk on 06/01/2024. Prescription sent had 25 mg dose but was still the 20 mg prescription. New prescription with correct prescription and dose have been pended. Added note to pharmacy to advised corrected prescription. Please review and sign.

## 2024-06-07 ENCOUNTER — Telehealth: Payer: Self-pay | Admitting: Physician Assistant

## 2024-06-07 ENCOUNTER — Telehealth: Payer: Self-pay

## 2024-06-07 NOTE — Telephone Encounter (Signed)
 Pt wanted to inform Waddell that the medication that was sent through mail order pharmacy, there was a problem with the prescription and he would need a written prescription sent to [facility] in [place]. Pt states he has this prescription in a pill form from previous refills and not an injection. Pt would like a call back before he leaves wednesday for work. 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

## 2024-06-07 NOTE — Telephone Encounter (Signed)
 Contacted the patient and patient states [facility] needs a new prior authorization for his Rasuvo  due to the dose increase from 20 mg to 25 mg. 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 Due to the chance of not having the prior authorization back in time, patient is going to take 10 tablets of his previous methotrexate  on Saturday. Per Waddell Craze, patient will take 5 tablets in the morning and 5 tablets in the afternoon. Advised the patient I will send a message to the pharmacy team for his Rasuvo  so that it could be done as soon as possible. Please advise.

## 2024-06-07 NOTE — Telephone Encounter (Signed)
 Received notification that pt requires a new PA for the increased dose of Rasuvo .  Submitted a Prior Authorization request to [employer] for RASUVO  via CoverMyMeds. Authorization has been APPROVED from 05/08/2024 to 06/07/2025. Approval letter has not yet been received, but will be sent to scan center once obtained.   CMM KEY: [id number] Authorization#: [id number]

## 2024-06-07 NOTE — Telephone Encounter (Signed)
 Noted, will initiate PA for dose increase in separate encounter.

## 2024-06-08 NOTE — Telephone Encounter (Signed)
 Attempted to contact the patient and left a message to call the office back. Want to advise the patient the prior authorization has been approved and that he should contact the pharmacy.

## 2024-06-08 NOTE — Telephone Encounter (Signed)
 Patient advised the prior authorization has been approved and that he should contact the pharmacy. Patient verbalized understanding.

## 2024-06-14 ENCOUNTER — Other Ambulatory Visit: Payer: Self-pay

## 2024-07-13 NOTE — Progress Notes (Deleted)
 Office Visit Note  Patient: Dustin [name]             Date of Birth: May 27, 1961           MRN: [id number]             PCP: Patient, No Pcp Per Referring: Bertell Satterfield, MD Visit Date: 07/27/2024 Occupation: TRUCK DRIVER  Subjective:  No chief complaint on file.   History of Present Illness: Dustin [name] is a 63 y.o. male ***     Activities of Daily Living:  Patient reports morning stiffness for *** {minute/hour:19697}.   Patient {ACTIONS;DENIES/REPORTS:21021675::Denies} nocturnal pain.  Difficulty dressing/grooming: {ACTIONS;DENIES/REPORTS:21021675::Denies} Difficulty climbing stairs: {ACTIONS;DENIES/REPORTS:21021675::Denies} Difficulty getting out of chair: {ACTIONS;DENIES/REPORTS:21021675::Denies} Difficulty using hands for taps, buttons, cutlery, and/or writing: {ACTIONS;DENIES/REPORTS:21021675::Denies}  No Rheumatology ROS completed.   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/14/2017   Abscess of left olecranon bursa 11/07/2016   High risk medication use 11/07/2016   Cervical disc disease 11/07/2016   Primary osteoarthritis of both hands 11/07/2016   Primary osteoarthritis of both knees 11/07/2016   Primary osteoarthritis of both shoulders 11/07/2016   Precordial pain 01/22/2016   Pain in the chest    Hyperlipidemia 01/20/2016   Depression with anxiety 01/20/2016   GERD (gastroesophageal reflux disease) 01/20/2016   Type 2 diabetes mellitus (HCC)  01/20/2016   Chest pain at rest 09/22/2014   Tobacco use 09/22/2014   Morbid obesity (HCC) 09/22/2014   Rheumatoid arthritis (HCC) 09/22/2014   Family history of heart disease 09/22/2014   Chest pain 09/22/2014    Past Medical History:  Diagnosis Date   Anxiety    COPD (chronic obstructive pulmonary disease) (HCC)    Depression    Essential hypertension    GERD (gastroesophageal reflux disease)    H/O hiatal hernia    History of cardiac catheterization    Normal coronaries May 2017   History of ITP    Childhood   Hypothyroidism    Osteoarthritis    Type 2 diabetes mellitus (HCC)    no meds    Family History  Problem Relation Age of Onset   Heart disease Mother    COPD Mother    Heart attack Father        Sudden death   Heart attack Sister 65   Kidney disease Sister    Heart disease Sister    Heart attack Brother    Heart disease Brother    Diabetes Brother    Heart attack Brother    Heart disease Brother 55       CABG   Heart attack Brother 14   Past Surgical History:  Procedure Laterality Date   ANTERIOR CERVICAL DECOMP/DISCECTOMY FUSION  01/21/2012   Procedure: ANTERIOR CERVICAL DECOMPRESSION/DISCECTOMY FUSION 2 LEVELS;  Surgeon: Fairy Levels, MD;  Location: [facility];  Service: Neurosurgery;  Laterality: N/A;  Cervical Six-Seven, Cervical Seven-Thoracic One, Anterior Cervical Decompression with Fusion Interbody Prothesis Plating and Bonegraft possible posterior  Cervical Seven-Thoracic One Foraminotomy   CARDIAC CATHETERIZATION  [phone]   normal coronary arteries   CARDIAC CATHETERIZATION N/A 01/22/2016   Procedure: Left Heart Cath and Coronary Angiography;  Surgeon: Lonni JONETTA Cash, MD;  Location: [facility] INVASIVE CV LAB;  Service: Cardiovascular;  Laterality: N/A;   CARPAL TUNNEL RELEASE Bilateral    CERVICAL DISC SURGERY  04   CHOLECYSTECTOMY N/A 10/26/2014   Procedure: LAPAROSCOPIC CHOLECYSTECTOMY;  Surgeon: Oneil DELENA Budge, MD;  Location: [facility];   Service: General;  Laterality: N/A;   ESOPHAGOGASTRODUODENOSCOPY N/A 05/03/2016   Procedure: ESOPHAGOGASTRODUODENOSCOPY (EGD);  Surgeon: Claudis RAYMOND Rivet, MD;  Location: [facility];  Service: Endoscopy;  Laterality: N/A;  10:30   HERNIA REPAIR Right 60's   IR IMAGING GUIDED PORT INSERTION  06/27/2021   IR REMOVAL TUN ACCESS W/ PORT W/O FL MOD SED  04/30/2022   Left elbow bursa removed     NASOPHARYNGOSCOPY N/A 06/04/2021   Procedure: NASAL ENDOSCOPY WITH BIOPSY OF NASOPHARYNX; FROZEN SECTION;  Surgeon: Jesus Oliphant, MD;  Location: [facility] OR;  Service: ENT;  Laterality: N/A;   TONSILLECTOMY  82   Social History   Tobacco Use   Smoking status: Former    Current packs/day: 0.00    Average packs/day: 1 pack/day for 47.3 years (47.3 ttl pk-yrs)    Types: Cigarettes    Start date: 03/13/1974    Quit date: 07/2021    Years since quitting: 2.9    Passive exposure: Past   Smokeless tobacco: Never  Vaping Use   Vaping status: Never Used  Substance Use Topics   Alcohol use: No    Alcohol/week: 0.0 standard drinks of alcohol   Drug use: Not Currently   Social History   Social History Narrative   Not on file     Immunization History  Administered Date(s) Administered   Influenza Inj Mdck Quad Pf 06/15/2018   Influenza, Seasonal, Injecte, Preservative Fre 07/19/2016, 06/15/2017   Influenza-Unspecified 06/16/2022   Moderna Covid-19 Vaccine Bivalent Booster 47yrs & up 06/20/2021   Moderna Sars-Covid-2 Vaccination 12/13/2019, 01/03/2020   Pneumococcal Polysaccharide-23 01/25/2019     Objective: Vital Signs: There were no vitals taken for this visit.   Physical Exam   Musculoskeletal Exam: ***  CDAI Exam: CDAI Score: -- Patient Global: --; Provider Global: -- Swollen: --; Tender: -- Joint Exam 07/27/2024   No joint exam has been documented for this visit   There is currently no information documented on the homunculus. Go to the Rheumatology activity and complete the homunculus  joint exam.  Investigation: No additional findings.  Imaging: No results found.  Recent Labs: Lab Results  Component Value Date   WBC 3.4 05/25/2024   HGB 12.6 (L) 05/25/2024   PLT 157 05/25/2024   NA 139 05/25/2024   K 4.6 05/25/2024   CL 100 05/25/2024   CO2 26 05/25/2024   GLUCOSE 159 (H) 05/25/2024   BUN 16 05/25/2024   CREATININE 0.87 05/25/2024   BILITOT 1.2 05/25/2024   ALKPHOS 70 05/25/2024   AST 23 05/25/2024   ALT 20 05/25/2024   PROT 6.9 05/25/2024   ALBUMIN  4.0 05/25/2024   CALCIUM  9.2 05/25/2024   GFRAA 108 09/29/2020    Speciality Comments: No specialty comments available.  Procedures:  No procedures performed Allergies: Patient has no known allergies.   Assessment / Plan:     Visit Diagnoses: No diagnosis found.  Orders: No orders of the defined types were placed in this encounter.  No orders of the  defined types were placed in this encounter.   Face-to-face time spent with patient was *** minutes. Greater than 50% of time was spent in counseling and coordination of care.  Follow-Up Instructions: No follow-ups on file.   Daved JAYSON Gavel, CMA  Note - This record has been created using Animal nutritionist.  Chart creation errors have been sought, but may not always  have been located. Such creation errors do not reflect on  the standard of medical care.

## 2024-07-27 ENCOUNTER — Telehealth: Payer: Self-pay | Admitting: Rheumatology

## 2024-07-27 ENCOUNTER — Ambulatory Visit: Payer: BC Managed Care – PPO | Admitting: Rheumatology

## 2024-07-27 ENCOUNTER — Other Ambulatory Visit: Payer: Self-pay | Admitting: *Deleted

## 2024-07-27 DIAGNOSIS — M0579 Rheumatoid arthritis with rheumatoid factor of multiple sites without organ or systems involvement: Secondary | ICD-10-CM

## 2024-07-27 DIAGNOSIS — M19042 Primary osteoarthritis, left hand: Secondary | ICD-10-CM

## 2024-07-27 DIAGNOSIS — E782 Mixed hyperlipidemia: Secondary | ICD-10-CM | POA: Diagnosis not present

## 2024-07-27 DIAGNOSIS — Z79899 Other long term (current) drug therapy: Secondary | ICD-10-CM

## 2024-07-27 DIAGNOSIS — Z8639 Personal history of other endocrine, nutritional and metabolic disease: Secondary | ICD-10-CM

## 2024-07-27 DIAGNOSIS — M0630 Rheumatoid nodule, unspecified site: Secondary | ICD-10-CM

## 2024-07-27 DIAGNOSIS — C119 Malignant neoplasm of nasopharynx, unspecified: Secondary | ICD-10-CM

## 2024-07-27 DIAGNOSIS — Z87891 Personal history of nicotine dependence: Secondary | ICD-10-CM

## 2024-07-27 DIAGNOSIS — M19011 Primary osteoarthritis, right shoulder: Secondary | ICD-10-CM

## 2024-07-27 DIAGNOSIS — Z9289 Personal history of other medical treatment: Secondary | ICD-10-CM

## 2024-07-27 DIAGNOSIS — M069 Rheumatoid arthritis, unspecified: Secondary | ICD-10-CM | POA: Diagnosis not present

## 2024-07-27 DIAGNOSIS — C44621 Squamous cell carcinoma of skin of unspecified upper limb, including shoulder: Secondary | ICD-10-CM

## 2024-07-27 DIAGNOSIS — Z7689 Persons encountering health services in other specified circumstances: Secondary | ICD-10-CM | POA: Diagnosis not present

## 2024-07-27 DIAGNOSIS — F5101 Primary insomnia: Secondary | ICD-10-CM

## 2024-07-27 DIAGNOSIS — E039 Hypothyroidism, unspecified: Secondary | ICD-10-CM | POA: Diagnosis not present

## 2024-07-27 DIAGNOSIS — Z8659 Personal history of other mental and behavioral disorders: Secondary | ICD-10-CM

## 2024-07-27 DIAGNOSIS — M5090 Cervical disc disorder, unspecified, unspecified cervical region: Secondary | ICD-10-CM

## 2024-07-27 DIAGNOSIS — M170 Bilateral primary osteoarthritis of knee: Secondary | ICD-10-CM

## 2024-07-27 DIAGNOSIS — J984 Other disorders of lung: Secondary | ICD-10-CM

## 2024-07-27 DIAGNOSIS — F419 Anxiety disorder, unspecified: Secondary | ICD-10-CM | POA: Diagnosis not present

## 2024-07-27 DIAGNOSIS — Z8719 Personal history of other diseases of the digestive system: Secondary | ICD-10-CM

## 2024-07-27 NOTE — Telephone Encounter (Signed)
 Patient contacted the office requesting lab orders to be be release to [facility] in [place].   Patient plans to have labs today, 07/27/24 at 10:00 am

## 2024-07-27 NOTE — Progress Notes (Deleted)
 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   History of Present Illness: Dustin [name] is a 63 y.o. male ***     Activities of Daily Living:  Patient reports morning stiffness for *** {minute/hour:19697}.   Patient {ACTIONS;DENIES/REPORTS:21021675::Denies} nocturnal pain.  Difficulty dressing/grooming: {ACTIONS;DENIES/REPORTS:21021675::Denies} Difficulty climbing stairs: {ACTIONS;DENIES/REPORTS:21021675::Denies} Difficulty getting out of chair: {ACTIONS;DENIES/REPORTS:21021675::Denies} Difficulty using hands for taps, buttons, cutlery, and/or writing: {ACTIONS;DENIES/REPORTS:21021675::Denies}  No Rheumatology ROS completed.   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/14/2017   Abscess of left olecranon bursa 11/07/2016   High risk medication use 11/07/2016   Cervical disc disease 11/07/2016   Primary osteoarthritis of both hands 11/07/2016   Primary osteoarthritis of both knees 11/07/2016   Primary osteoarthritis of both shoulders 11/07/2016   Precordial pain 01/22/2016   Pain in the chest    Hyperlipidemia 01/20/2016   Depression with anxiety 01/20/2016   GERD (gastroesophageal reflux disease) 01/20/2016   Type 2 diabetes mellitus (HCC)  01/20/2016   Chest pain at rest 09/22/2014   Tobacco use 09/22/2014   Morbid obesity (HCC) 09/22/2014   Rheumatoid arthritis (HCC) 09/22/2014   Family history of heart disease 09/22/2014   Chest pain 09/22/2014    Past Medical History:  Diagnosis Date   Anxiety    COPD (chronic obstructive pulmonary disease) (HCC)    Depression    Essential hypertension    GERD (gastroesophageal reflux disease)    H/O hiatal hernia    History of cardiac catheterization    Normal coronaries May 2017   History of ITP    Childhood   Hypothyroidism    Osteoarthritis    Type 2 diabetes mellitus (HCC)    no meds    Family History  Problem Relation Age of Onset   Heart disease Mother    COPD Mother    Heart attack Father        Sudden death   Heart attack Sister 53   Kidney disease Sister    Heart disease Sister    Heart attack Brother    Heart disease Brother    Diabetes Brother    Heart attack Brother    Heart disease Brother 76       CABG   Heart attack Brother 67   Past Surgical History:  Procedure Laterality Date   ANTERIOR CERVICAL DECOMP/DISCECTOMY FUSION  01/21/2012   Procedure: ANTERIOR CERVICAL DECOMPRESSION/DISCECTOMY FUSION 2 LEVELS;  Surgeon: Fairy Levels, MD;  Location: [facility];  Service: Neurosurgery;  Laterality: N/A;  Cervical Six-Seven, Cervical Seven-Thoracic One, Anterior Cervical Decompression with Fusion Interbody Prothesis Plating and Bonegraft possible posterior  Cervical Seven-Thoracic One Foraminotomy   CARDIAC CATHETERIZATION  [phone]   normal coronary arteries   CARDIAC CATHETERIZATION N/A 01/22/2016   Procedure: Left Heart Cath and Coronary Angiography;  Surgeon: Lonni JONETTA Cash, MD;  Location: [facility] INVASIVE CV LAB;  Service: Cardiovascular;  Laterality: N/A;   CARPAL TUNNEL RELEASE Bilateral    CERVICAL DISC SURGERY  04   CHOLECYSTECTOMY N/A 10/26/2014   Procedure: LAPAROSCOPIC CHOLECYSTECTOMY;  Surgeon: Oneil DELENA Budge, MD;  Location: [facility];   Service: General;  Laterality: N/A;   ESOPHAGOGASTRODUODENOSCOPY N/A 05/03/2016   Procedure: ESOPHAGOGASTRODUODENOSCOPY (EGD);  Surgeon: Claudis RAYMOND Rivet, MD;  Location: [facility];  Service: Endoscopy;  Laterality: N/A;  10:30   HERNIA REPAIR Right 60's   IR IMAGING GUIDED PORT INSERTION  06/27/2021   IR REMOVAL TUN ACCESS W/ PORT W/O FL MOD SED  04/30/2022   Left elbow bursa removed     NASOPHARYNGOSCOPY N/A 06/04/2021   Procedure: NASAL ENDOSCOPY WITH BIOPSY OF NASOPHARYNX; FROZEN SECTION;  Surgeon: Jesus Oliphant, MD;  Location: [facility] OR;  Service: ENT;  Laterality: N/A;   TONSILLECTOMY  14   Social History   Tobacco Use   Smoking status: Former    Current packs/day: 0.00    Average packs/day: 1 pack/day for 47.3 years (47.3 ttl pk-yrs)    Types: Cigarettes    Start date: 03/13/1974    Quit date: 07/2021    Years since quitting: 3.0    Passive exposure: Past   Smokeless tobacco: Never  Vaping Use   Vaping status: Never Used  Substance Use Topics   Alcohol use: No    Alcohol/week: 0.0 standard drinks of alcohol   Drug use: Not Currently   Social History   Social History Narrative   Not on file     Immunization History  Administered Date(s) Administered   Influenza Inj Mdck Quad Pf 06/15/2018   Influenza, Seasonal, Injecte, Preservative Fre 07/19/2016, 06/15/2017   Influenza-Unspecified 06/16/2022   Moderna Covid-19 Vaccine Bivalent Booster 36yrs & up 06/20/2021   Moderna Sars-Covid-2 Vaccination 12/13/2019, 01/03/2020   Pneumococcal Polysaccharide-23 01/25/2019     Objective: Vital Signs: There were no vitals taken for this visit.   Physical Exam   Musculoskeletal Exam: ***  CDAI Exam: CDAI Score: -- Patient Global: --; Provider Global: -- Swollen: --; Tender: -- Joint Exam 08/10/2024   No joint exam has been documented for this visit   There is currently no information documented on the homunculus. Go to the Rheumatology activity and complete the homunculus  joint exam.  Investigation: No additional findings.  Imaging: No results found.  Recent Labs: Lab Results  Component Value Date   WBC 3.4 05/25/2024   HGB 12.6 (L) 05/25/2024   PLT 157 05/25/2024   NA 139 05/25/2024   K 4.6 05/25/2024   CL 100 05/25/2024   CO2 26 05/25/2024   GLUCOSE 159 (H) 05/25/2024   BUN 16 05/25/2024   CREATININE 0.87 05/25/2024   BILITOT 1.2 05/25/2024   ALKPHOS 70 05/25/2024   AST 23 05/25/2024   ALT 20 05/25/2024   PROT 6.9 05/25/2024   ALBUMIN  4.0 05/25/2024   CALCIUM  9.2 05/25/2024   GFRAA 108 09/29/2020    Speciality Comments: No specialty comments available.  Procedures:  No procedures performed Allergies: Patient has no known allergies.   Assessment / Plan:     Visit Diagnoses: No diagnosis found.  Orders: No orders of the defined types were placed in this encounter.  No orders of the  defined types were placed in this encounter.   Face-to-face time spent with patient was *** minutes. Greater than 50% of time was spent in counseling and coordination of care.  Follow-Up Instructions: No follow-ups on file.   Daved JAYSON Gavel, CMA  Note - This record has been created using Animal nutritionist.  Chart creation errors have been sought, but may not always  have been located. Such creation errors do not reflect on  the standard of medical care.

## 2024-07-27 NOTE — Telephone Encounter (Signed)
 Lab Orders released.

## 2024-07-28 LAB — COMPREHENSIVE METABOLIC PANEL WITH GFR
ALT: 19 IU/L (ref 0–44)
AST: 26 IU/L (ref 0–40)
Albumin: 4.4 g/dL (ref 3.9–4.9)
Alkaline Phosphatase: 58 IU/L (ref 47–123)
BUN/Creatinine Ratio: 20 (ref 10–24)
BUN: 18 mg/dL (ref 8–27)
Bilirubin Total: 1.1 mg/dL (ref 0.0–1.2)
CO2: 26 mmol/L (ref 20–29)
Calcium: 9.4 mg/dL (ref 8.6–10.2)
Chloride: 100 mmol/L (ref 96–106)
Creatinine, Ser: 0.92 mg/dL (ref 0.76–1.27)
Globulin, Total: 3 g/dL (ref 1.5–4.5)
Glucose: 96 mg/dL (ref 70–99)
Potassium: 5.2 mmol/L (ref 3.5–5.2)
Sodium: 138 mmol/L (ref 134–144)
Total Protein: 7.4 g/dL (ref 6.0–8.5)
eGFR: 92 mL/min/1.73 (ref >59)

## 2024-07-28 LAB — CBC WITH DIFFERENTIAL/PLATELET
Basophils Absolute: 0 x10E3/uL (ref 0.0–0.2)
Basos: 1 %
EOS (ABSOLUTE): 0 x10E3/uL (ref 0.0–0.4)
Eos: 1 %
Hematocrit: 41.9 % (ref 37.5–51.0)
Hemoglobin: 13.7 g/dL (ref 13.0–17.7)
Immature Grans (Abs): 0 x10E3/uL (ref 0.0–0.1)
Immature Granulocytes: 0 %
Lymphocytes Absolute: 1.6 x10E3/uL (ref 0.7–3.1)
Lymphs: 44 %
MCH: 33.2 pg — ABNORMAL HIGH (ref 26.6–33.0)
MCHC: 32.7 g/dL (ref 31.5–35.7)
MCV: 102 fL — ABNORMAL HIGH (ref 79–97)
Monocytes Absolute: 0.4 x10E3/uL (ref 0.1–0.9)
Monocytes: 11 %
Neutrophils Absolute: 1.5 x10E3/uL (ref 1.4–7.0)
Neutrophils: 43 %
Platelets: 186 x10E3/uL (ref 150–450)
RBC: 4.13 x10E6/uL — ABNORMAL LOW (ref 4.14–5.80)
RDW: 13.9 % (ref 11.6–15.4)
WBC: 3.5 x10E3/uL (ref 3.4–10.8)

## 2024-07-28 NOTE — Progress Notes (Signed)
 CBC and CMP are stable.

## 2024-08-10 ENCOUNTER — Other Ambulatory Visit

## 2024-08-10 ENCOUNTER — Ambulatory Visit: Admitting: Rheumatology

## 2024-08-10 DIAGNOSIS — F5101 Primary insomnia: Secondary | ICD-10-CM

## 2024-08-10 DIAGNOSIS — Z79899 Other long term (current) drug therapy: Secondary | ICD-10-CM

## 2024-08-10 DIAGNOSIS — Z8719 Personal history of other diseases of the digestive system: Secondary | ICD-10-CM

## 2024-08-10 DIAGNOSIS — M170 Bilateral primary osteoarthritis of knee: Secondary | ICD-10-CM

## 2024-08-10 DIAGNOSIS — M0630 Rheumatoid nodule, unspecified site: Secondary | ICD-10-CM

## 2024-08-10 DIAGNOSIS — Z8639 Personal history of other endocrine, nutritional and metabolic disease: Secondary | ICD-10-CM

## 2024-08-10 DIAGNOSIS — M19011 Primary osteoarthritis, right shoulder: Secondary | ICD-10-CM

## 2024-08-10 DIAGNOSIS — Z87891 Personal history of nicotine dependence: Secondary | ICD-10-CM

## 2024-08-10 DIAGNOSIS — Z8659 Personal history of other mental and behavioral disorders: Secondary | ICD-10-CM

## 2024-08-10 DIAGNOSIS — M0579 Rheumatoid arthritis with rheumatoid factor of multiple sites without organ or systems involvement: Secondary | ICD-10-CM

## 2024-08-10 DIAGNOSIS — M19041 Primary osteoarthritis, right hand: Secondary | ICD-10-CM

## 2024-08-10 DIAGNOSIS — C119 Malignant neoplasm of nasopharynx, unspecified: Secondary | ICD-10-CM

## 2024-08-10 DIAGNOSIS — C44621 Squamous cell carcinoma of skin of unspecified upper limb, including shoulder: Secondary | ICD-10-CM

## 2024-08-10 DIAGNOSIS — M5090 Cervical disc disorder, unspecified, unspecified cervical region: Secondary | ICD-10-CM

## 2024-08-10 DIAGNOSIS — J984 Other disorders of lung: Secondary | ICD-10-CM

## 2024-08-10 DIAGNOSIS — Z9289 Personal history of other medical treatment: Secondary | ICD-10-CM

## 2024-08-17 ENCOUNTER — Encounter

## 2024-08-17 ENCOUNTER — Ambulatory Visit: Admitting: Pulmonary Disease

## 2024-09-01 ENCOUNTER — Other Ambulatory Visit: Payer: Self-pay | Admitting: Rheumatology

## 2024-09-01 MED ORDER — METHOTREXATE SODIUM CHEMO INJECTION 50 MG/2ML: 25.0000 mg | INTRAMUSCULAR | 0 refills | Status: AC

## 2024-09-01 NOTE — Telephone Encounter (Signed)
 Patient states he received a notice from [employer] that Rasuvo  will not be covered after 09/16/2024. He states that he currently pays $75 for a 3 month supply of Rasuvo . He was advised by [facility] that a 3 month supply of vial and syringe methotrexate  will cost $18.   Patient would like to switch to vial and syringe methotrexate . Please advise.

## 2024-09-01 NOTE — Telephone Encounter (Signed)
 Ok to switch to vial and syringe methotrexate 

## 2024-09-01 NOTE — Telephone Encounter (Signed)
 Patient is requesting a call from the clinic to discuss some medication issues he is having.

## 2024-09-01 NOTE — Addendum Note (Signed)
 Addended by: CHERYL WADDELL HERO on: 09/01/2024 03:32 PM   Modules accepted: Orders

## 2024-09-01 NOTE — Addendum Note (Signed)
 Addended by: CENA ALFONSO CROME on: 09/01/2024 02:43 PM   Modules accepted: Orders

## 2024-10-12 NOTE — Progress Notes (Unsigned)
 "  Office Visit Note  Patient: Dustin [name]             Date of Birth: November 06, 1960           MRN: [id number]             PCP: Patient, No Pcp Per Referring: No ref. provider found Visit Date: 10/26/2024 Occupation: TRUCK DRIVER  Subjective:  No chief complaint on file.   History of Present Illness: Dustin [name] is a 64 y.o. male ***     Activities of Daily Living:  Patient reports morning stiffness for *** {minute/hour:19697}.   Patient {ACTIONS;DENIES/REPORTS:21021675::Denies} nocturnal pain.  Difficulty dressing/grooming: {ACTIONS;DENIES/REPORTS:21021675::Denies} Difficulty climbing stairs: {ACTIONS;DENIES/REPORTS:21021675::Denies} Difficulty getting out of chair: {ACTIONS;DENIES/REPORTS:21021675::Denies} Difficulty using hands for taps, buttons, cutlery, and/or writing: {ACTIONS;DENIES/REPORTS:21021675::Denies}  No Rheumatology ROS completed.   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/14/2017   Abscess of left olecranon bursa 11/07/2016   High risk medication use 11/07/2016   Cervical disc disease 11/07/2016   Primary osteoarthritis of both hands 11/07/2016   Primary osteoarthritis of both knees 11/07/2016   Primary osteoarthritis of both shoulders 11/07/2016   Precordial pain 01/22/2016   Pain in the chest    Hyperlipidemia 01/20/2016   Depression with anxiety 01/20/2016   GERD (gastroesophageal reflux disease) 01/20/2016   Type 2 diabetes mellitus  (HCC) 01/20/2016   Chest pain at rest 09/22/2014   Tobacco use 09/22/2014   Morbid obesity (HCC) 09/22/2014   Rheumatoid arthritis (HCC) 09/22/2014   Family history of heart disease 09/22/2014   Chest pain 09/22/2014    Past Medical History:  Diagnosis Date   Anxiety    COPD (chronic obstructive pulmonary disease) (HCC)    Depression    Essential hypertension    GERD (gastroesophageal reflux disease)    H/O hiatal hernia    History of cardiac catheterization    Normal coronaries May 2017   History of ITP    Childhood   Hypothyroidism    Osteoarthritis    Type 2 diabetes mellitus (HCC)    no meds    Family History  Problem Relation Age of Onset   Heart disease Mother    COPD Mother    Heart attack Father        Sudden death   Heart attack Sister 2   Kidney disease Sister    Heart disease Sister    Heart attack Brother    Heart disease Brother    Diabetes Brother    Heart attack Brother    Heart disease Brother 70       CABG   Heart attack Brother 35   Past Surgical History:  Procedure Laterality Date   ANTERIOR CERVICAL DECOMP/DISCECTOMY FUSION  01/21/2012   Procedure: ANTERIOR CERVICAL DECOMPRESSION/DISCECTOMY FUSION 2 LEVELS;  Surgeon: Fairy Levels, MD;  Location: [facility];  Service: Neurosurgery;  Laterality: N/A;  Cervical Six-Seven, Cervical Seven-Thoracic One, Anterior Cervical Decompression with Fusion Interbody Prothesis Plating and Bonegraft  possible posterior Cervical Seven-Thoracic One Foraminotomy   CARDIAC CATHETERIZATION  [phone]   normal coronary arteries   CARDIAC CATHETERIZATION N/A 01/22/2016   Procedure: Left Heart Cath and Coronary Angiography;  Surgeon: Lonni JONETTA Cash, MD;  Location: [facility] INVASIVE CV LAB;  Service: Cardiovascular;  Laterality: N/A;   CARPAL TUNNEL RELEASE Bilateral    CERVICAL DISC SURGERY  04   CHOLECYSTECTOMY N/A 10/26/2014   Procedure: LAPAROSCOPIC CHOLECYSTECTOMY;  Surgeon: Oneil DELENA Budge, MD;  Location: [facility]  ORS;  Service: General;  Laterality: N/A;   ESOPHAGOGASTRODUODENOSCOPY N/A 05/03/2016   Procedure: ESOPHAGOGASTRODUODENOSCOPY (EGD);  Surgeon: Claudis RAYMOND Rivet, MD;  Location: [facility] SUITE;  Service: Endoscopy;  Laterality: N/A;  10:30   HERNIA REPAIR Right 60's   IR IMAGING GUIDED PORT INSERTION  06/27/2021   IR REMOVAL TUN ACCESS W/ PORT W/O FL MOD SED  04/30/2022   Left elbow bursa removed     NASOPHARYNGOSCOPY N/A 06/04/2021   Procedure: NASAL ENDOSCOPY WITH BIOPSY OF NASOPHARYNX; FROZEN SECTION;  Surgeon: Jesus Oliphant, MD;  Location: [facility] OR;  Service: ENT;  Laterality: N/A;   TONSILLECTOMY  33   Social History[1] Social History   Social History Narrative   Not on file     Immunization History  Administered Date(s) Administered   Influenza Inj Mdck Quad Pf 06/15/2018   Influenza, Seasonal, Injecte, Preservative Fre 07/19/2016, 06/15/2017   Influenza-Unspecified 06/16/2022   Moderna Covid-19 Vaccine Bivalent Booster 53yrs & up 06/20/2021   Moderna Sars-Covid-2 Vaccination 12/13/2019, 01/03/2020   Pneumococcal Polysaccharide-23 01/25/2019     Objective: Vital Signs: There were no vitals taken for this visit.   Physical Exam   Musculoskeletal Exam: ***  CDAI Exam: CDAI Score: -- Patient Global: --; Provider Global: -- Swollen: --; Tender: -- Joint Exam 10/26/2024   No joint exam has been documented for this visit   There is currently no information documented on the homunculus. Go to the Rheumatology activity and complete the homunculus joint exam.  Investigation: No additional findings.  Imaging: No results found.  Recent Labs: Lab Results  Component Value Date   WBC 3.5 07/27/2024   HGB 13.7 07/27/2024   PLT 186 07/27/2024   NA 138 07/27/2024   K 5.2 07/27/2024   CL 100 07/27/2024   CO2 26 07/27/2024   GLUCOSE 96 07/27/2024   BUN 18 07/27/2024   CREATININE 0.92 07/27/2024   BILITOT 1.1 07/27/2024   ALKPHOS 58 07/27/2024   AST 26 07/27/2024   ALT 19  07/27/2024   PROT 7.4 07/27/2024   ALBUMIN  4.4 07/27/2024   CALCIUM  9.4 07/27/2024   GFRAA 108 09/29/2020    Speciality Comments: No specialty comments available.  Procedures:  No procedures performed Allergies: Patient has no known allergies.   Assessment / Plan:     Visit Diagnoses: Rheumatoid arthritis involving multiple sites with positive rheumatoid factor (HCC)  High risk medication use  Rheumatoid nodulosis (HCC)  Pneumonitis  History of positive PPD treated with INH   Primary osteoarthritis of both hands  Primary osteoarthritis of both shoulders  Primary osteoarthritis of both knees  Cervical disc disease  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HCC)  History of hyperlipidemia  History of gastroesophageal reflux (GERD)  History of diabetes mellitus  Former smoker  Primary insomnia  History of depression  History of hypothyroidism  Orders: No orders of the defined types were placed in this encounter.  No orders of the defined types were placed in this encounter.   Face-to-face time spent with patient was ***  minutes. Greater than 50% of time was spent in counseling and coordination of care.  Follow-Up Instructions: No follow-ups on file.   Waddell CHRISTELLA Craze, PA-C  Note - This record has been created using Dragon software.  Chart creation errors have been sought, but may not always  have been located. Such creation errors do not reflect on  the standard of medical care.     [1]  Social History Tobacco Use   Smoking status: Former    Current packs/day: 0.00    Average packs/day: 1 pack/day for 47.3 years (47.3 ttl pk-yrs)    Types: Cigarettes    Start date: 03/13/1974    Quit date: 07/2021    Years since quitting: 3.2    Passive exposure: Past   Smokeless tobacco: Never  Vaping Use   Vaping status: Never Used  Substance Use Topics   Alcohol use: No    Alcohol/week: 0.0 standard drinks of alcohol   Drug use: Not Currently   "

## 2024-10-26 ENCOUNTER — Ambulatory Visit: Admitting: Physician Assistant

## 2024-10-26 DIAGNOSIS — M0579 Rheumatoid arthritis with rheumatoid factor of multiple sites without organ or systems involvement: Secondary | ICD-10-CM

## 2024-10-26 DIAGNOSIS — M19041 Primary osteoarthritis, right hand: Secondary | ICD-10-CM

## 2024-10-26 DIAGNOSIS — Z87891 Personal history of nicotine dependence: Secondary | ICD-10-CM

## 2024-10-26 DIAGNOSIS — Z8719 Personal history of other diseases of the digestive system: Secondary | ICD-10-CM

## 2024-10-26 DIAGNOSIS — J984 Other disorders of lung: Secondary | ICD-10-CM

## 2024-10-26 DIAGNOSIS — M5090 Cervical disc disorder, unspecified, unspecified cervical region: Secondary | ICD-10-CM

## 2024-10-26 DIAGNOSIS — F5101 Primary insomnia: Secondary | ICD-10-CM

## 2024-10-26 DIAGNOSIS — M0630 Rheumatoid nodule, unspecified site: Secondary | ICD-10-CM

## 2024-10-26 DIAGNOSIS — Z79899 Other long term (current) drug therapy: Secondary | ICD-10-CM

## 2024-10-26 DIAGNOSIS — Z9289 Personal history of other medical treatment: Secondary | ICD-10-CM

## 2024-10-26 DIAGNOSIS — Z8659 Personal history of other mental and behavioral disorders: Secondary | ICD-10-CM

## 2024-10-26 DIAGNOSIS — M19011 Primary osteoarthritis, right shoulder: Secondary | ICD-10-CM

## 2024-10-26 DIAGNOSIS — M170 Bilateral primary osteoarthritis of knee: Secondary | ICD-10-CM

## 2024-10-26 DIAGNOSIS — Z8639 Personal history of other endocrine, nutritional and metabolic disease: Secondary | ICD-10-CM

## 2024-10-26 DIAGNOSIS — C119 Malignant neoplasm of nasopharynx, unspecified: Secondary | ICD-10-CM

## 2024-11-03 ENCOUNTER — Ambulatory Visit: Admitting: Oncology
